# Patient Record
Sex: Female | Born: 1945 | Race: White | Hispanic: No | Marital: Married | Smoking: Former smoker
Health system: Southern US, Community
[De-identification: ages and names within clinical notes are randomized; demographics above are authoritative.]

## PROBLEM LIST (undated history)

## (undated) DIAGNOSIS — F419 Anxiety disorder, unspecified: Secondary | ICD-10-CM

## (undated) DIAGNOSIS — F1010 Alcohol abuse, uncomplicated: Secondary | ICD-10-CM

## (undated) DIAGNOSIS — G8929 Other chronic pain: Secondary | ICD-10-CM

## (undated) DIAGNOSIS — E7800 Pure hypercholesterolemia, unspecified: Secondary | ICD-10-CM

## (undated) DIAGNOSIS — Z9981 Dependence on supplemental oxygen: Secondary | ICD-10-CM

## (undated) DIAGNOSIS — J189 Pneumonia, unspecified organism: Secondary | ICD-10-CM

## (undated) DIAGNOSIS — Z9289 Personal history of other medical treatment: Secondary | ICD-10-CM

## (undated) DIAGNOSIS — F329 Major depressive disorder, single episode, unspecified: Secondary | ICD-10-CM

## (undated) DIAGNOSIS — J42 Unspecified chronic bronchitis: Secondary | ICD-10-CM

## (undated) DIAGNOSIS — I509 Heart failure, unspecified: Secondary | ICD-10-CM

## (undated) DIAGNOSIS — F32A Depression, unspecified: Secondary | ICD-10-CM

## (undated) DIAGNOSIS — I10 Essential (primary) hypertension: Secondary | ICD-10-CM

## (undated) DIAGNOSIS — J45909 Unspecified asthma, uncomplicated: Secondary | ICD-10-CM

## (undated) DIAGNOSIS — I4891 Unspecified atrial fibrillation: Secondary | ICD-10-CM

## (undated) DIAGNOSIS — M545 Low back pain, unspecified: Secondary | ICD-10-CM

## (undated) DIAGNOSIS — R739 Hyperglycemia, unspecified: Secondary | ICD-10-CM

## (undated) DIAGNOSIS — M1990 Unspecified osteoarthritis, unspecified site: Secondary | ICD-10-CM

## (undated) DIAGNOSIS — M87059 Idiopathic aseptic necrosis of unspecified femur: Secondary | ICD-10-CM

## (undated) DIAGNOSIS — L4050 Arthropathic psoriasis, unspecified: Secondary | ICD-10-CM

## (undated) DIAGNOSIS — R011 Cardiac murmur, unspecified: Secondary | ICD-10-CM

## (undated) DIAGNOSIS — D649 Anemia, unspecified: Secondary | ICD-10-CM

## (undated) DIAGNOSIS — J449 Chronic obstructive pulmonary disease, unspecified: Secondary | ICD-10-CM

## (undated) HISTORY — DX: Idiopathic aseptic necrosis of unspecified femur: M87.059

## (undated) HISTORY — DX: Anxiety disorder, unspecified: F41.9

## (undated) HISTORY — PX: TUBAL LIGATION: SHX77

## (undated) HISTORY — DX: Chronic obstructive pulmonary disease, unspecified: J44.9

## (undated) HISTORY — DX: Hyperglycemia, unspecified: R73.9

## (undated) HISTORY — DX: Unspecified atrial fibrillation: I48.91

## (undated) HISTORY — PX: JOINT REPLACEMENT: SHX530

## (undated) HISTORY — DX: Alcohol abuse, uncomplicated: F10.10

## (undated) HISTORY — DX: Essential (primary) hypertension: I10

## (undated) HISTORY — PX: TONSILLECTOMY AND ADENOIDECTOMY: SUR1326

---

## 2006-03-12 ENCOUNTER — Inpatient Hospital Stay (HOSPITAL_COMMUNITY): Admission: EM | Admit: 2006-03-12 | Discharge: 2006-03-16 | Payer: Self-pay | Admitting: Emergency Medicine

## 2007-09-22 ENCOUNTER — Ambulatory Visit (HOSPITAL_COMMUNITY): Admission: RE | Admit: 2007-09-22 | Discharge: 2007-09-22 | Payer: Self-pay | Admitting: Family Medicine

## 2008-01-29 ENCOUNTER — Ambulatory Visit (HOSPITAL_COMMUNITY): Admission: RE | Admit: 2008-01-29 | Discharge: 2008-01-29 | Payer: Self-pay | Admitting: Chiropractic Medicine

## 2008-05-11 ENCOUNTER — Inpatient Hospital Stay (HOSPITAL_COMMUNITY): Admission: RE | Admit: 2008-05-11 | Discharge: 2008-05-16 | Payer: Self-pay | Admitting: Orthopedic Surgery

## 2008-05-15 HISTORY — PX: TOTAL HIP ARTHROPLASTY: SHX124

## 2009-03-31 ENCOUNTER — Encounter: Payer: Self-pay | Admitting: Emergency Medicine

## 2009-04-01 ENCOUNTER — Ambulatory Visit: Payer: Self-pay | Admitting: Emergency Medicine

## 2009-04-01 ENCOUNTER — Inpatient Hospital Stay (HOSPITAL_COMMUNITY): Admission: EM | Admit: 2009-04-01 | Discharge: 2009-04-06 | Payer: Self-pay | Admitting: Internal Medicine

## 2009-04-07 ENCOUNTER — Telehealth: Payer: Self-pay | Admitting: Pulmonary Disease

## 2009-04-18 ENCOUNTER — Telehealth (INDEPENDENT_AMBULATORY_CARE_PROVIDER_SITE_OTHER): Payer: Self-pay | Admitting: *Deleted

## 2009-04-18 DIAGNOSIS — J449 Chronic obstructive pulmonary disease, unspecified: Secondary | ICD-10-CM | POA: Insufficient documentation

## 2009-04-19 ENCOUNTER — Encounter: Payer: Self-pay | Admitting: Emergency Medicine

## 2009-04-20 ENCOUNTER — Ambulatory Visit: Payer: Self-pay | Admitting: Emergency Medicine

## 2009-04-20 DIAGNOSIS — A3790 Whooping cough, unspecified species without pneumonia: Secondary | ICD-10-CM | POA: Insufficient documentation

## 2009-04-20 DIAGNOSIS — R930 Abnormal findings on diagnostic imaging of skull and head, not elsewhere classified: Secondary | ICD-10-CM

## 2009-04-20 DIAGNOSIS — Z8679 Personal history of other diseases of the circulatory system: Secondary | ICD-10-CM

## 2009-04-26 ENCOUNTER — Ambulatory Visit (HOSPITAL_COMMUNITY): Admission: RE | Admit: 2009-04-26 | Discharge: 2009-04-26 | Payer: Self-pay | Admitting: Emergency Medicine

## 2009-04-28 ENCOUNTER — Encounter: Payer: Self-pay | Admitting: Emergency Medicine

## 2009-05-04 ENCOUNTER — Encounter: Payer: Self-pay | Admitting: Emergency Medicine

## 2009-05-17 ENCOUNTER — Ambulatory Visit: Payer: Self-pay | Admitting: Emergency Medicine

## 2009-05-17 ENCOUNTER — Encounter: Payer: Self-pay | Admitting: Emergency Medicine

## 2009-06-14 ENCOUNTER — Encounter: Payer: Self-pay | Admitting: Emergency Medicine

## 2010-03-03 LAB — HM COLONOSCOPY

## 2010-04-08 ENCOUNTER — Encounter: Payer: Self-pay | Admitting: Internal Medicine

## 2010-04-19 NOTE — Miscellaneous (Signed)
Summary: PT Eval orders/[facility]  PT Eval orders/[facility]   Imported By: Sherian Rein 04/28/2009 13:52:18  _____________________________________________________________________  External Attachment:    Type:   Image     Comment:   External Document

## 2010-04-19 NOTE — Miscellaneous (Signed)
Summary: Plan/[facility]  Plan/[facility]   Imported By: Lester Sunburst 05/09/2009 09:50:39  _____________________________________________________________________  External Attachment:    Type:   Image     Comment:   External Document

## 2010-04-19 NOTE — Progress Notes (Signed)
Summary: order for therapy  Phone Note From Other Clinic Call back at [phone] ex [phone]   Caller: [facility]( [facility])  Call For: byrum Summary of Call: pt is requesting homehealth physical therapy Initial call taken by: Rickard Patience,  April 18, 2009 9:05 AM  Follow-up for Phone Call        Please advise.  Aundra Millet Reynolds LPN  April 18, 2009 9:57 AM   OK putting the order in Leslye Peer MD  April 18, 2009 2:46 PM  Follow-up by: Leslye Peer MD,  April 18, 2009 2:47 PM  New Problems: COPD (ICD-496)   New Problems: COPD (ICD-496)

## 2010-04-19 NOTE — Assessment & Plan Note (Signed)
Summary: COPD   Visit Type:  Follow-up Primary Provider/Referring Provider:  Dr. Paulene Floor at [facility]  CC:  Followup COPD with PFT's.  Pt states that overall her breathing has been okay.  She denies any complaints..  History of Present Illness: 65 yo woman with hx tobacco, COPD dx [place] in 2000, HTN. Was admitted to [facility] 1/15-1/20/11 for Resp Failure in setting COPD exacerbation and scattered patchy in filtrates on CT scan of the chest. Was treated for CAP with plans to repeat films and insure clearance, consider other etiologies (? COP). Completing her pred taper now, taking alb/atrovent/budesonide nebs since d/c.   ROV 05/17/09 -- Returns for F/U. CT scan done since last visit shows resolution of her PNA in July. Had PFT today as below, shows severe AFL with decreased diffusion (FEV1 0.68L). Has been doing fairly well. Some exertional dyspnea but stable. No exacerbations since last visit.   Current Medications (verified): 1)  Proair Hfa 108 (90 Base) Mcg/act Aers (Albuterol Sulfate) .... 2 Puffs Every 6 Hours As Needed 2)  Ipratropium Bromide 0.02 % Soln (Ipratropium Bromide) .... Four Times Daily 3)  Albuterol Sulfate (2.5 Mg/73ml) 0.083% Nebu (Albuterol Sulfate) .... Four Times Daily 4)  Mucinex 600 Mg Xr12h-Tab (Guaifenesin) .... Two Times A Day 5)  Benicar 40 Mg Tabs (Olmesartan Medoxomil) .... 1/2 By Mouth Daily 6)  Robaxin 500 Mg Tabs (Methocarbamol) .... Three Times A Day As Needed 7)  Hydrochlorothiazide 25 Mg Tabs (Hydrochlorothiazide) .Marland Kitchen.. 1 By Mouth Daily 8)  Amitriptyline Hcl 50 Mg Tabs (Amitriptyline Hcl) .Marland Kitchen.. 1 By Mouth At Bedtime 9)  Hydrocodone-Acetaminophen 5-325 Mg Tabs (Hydrocodone-Acetaminophen) .Marland Kitchen.. 1 By Mouth Every 6-8 Hours As Needed 10)  Aspirin 325 Mg Tabs (Aspirin) .Marland Kitchen.. 1 Once Daily  Allergies (verified): 1)  ! Tramadol Hcl  Vital Signs:  Patient profile:   65 year old female Weight:      135 pounds O2 Sat:      93 % on Room air Temp:      98.0 degrees F oral Pulse rate:   94 / minute BP sitting:   136 / 80  (left arm)  Vitals Entered By: Vernie Murders (May 17, 2009 1:43 PM)  O2 Flow:  Room air  Physical Exam  General:  elderly woman, ill appearing, on O2 Head:  normocephalic and atraumatic Eyes:  conjunctiva and sclera clear Nose:  no deformity, discharge, inflammation, or lesions Mouth:  no deformity or lesions Neck:  no masses, thyromegaly, or abnormal cervical nodes Lungs:  very distant, mild end exp wheeze on a forced exp Heart:  regular rate and rhythm, S1, S2 without murmurs, rubs, gallops, or clicks Abdomen:  not examined Msk:  no deformity or scoliosis noted with normal posture Extremities:  no clubbing, cyanosis, edema, or deformity noted Neurologic:  non-focal Skin:  intact without lesions or rashes Psych:  alert and cooperative; normal mood and affect; normal attention span and concentration   Impression & Recommendations:  Problem # 1:  COPD (ICD-496) Continue your albuterol and ipratropium nebulizers as you are taking them.  Continue your oxygen at all times, especially with exertion.  Do not restart budesonide nebulized at this time.  Follow up with Dr Delton Coombes in 4 months or as needed   Medications Added to Medication List This Visit: 1)  Aspirin 325 Mg Tabs (Aspirin) .Marland Kitchen.. 1 once daily  Other Orders: Est. Patient Level IV (16109)  Patient Instructions: 1)  Continue your albuterol and ipratropium nebulizers as you  are taking them.  2)  Continue your oxygen at all times, especially with exertion.  3)  Do not restart budesonide nebulized at this time.  4)  Follow up with Dr Delton Coombes in 4 months or as needed

## 2010-04-19 NOTE — Miscellaneous (Signed)
Summary: PFT  Clinical Lists Changes  Observations: Added new observation of PFT COMMENTS: Very sevewre AFL, no response to BD. Volumes are hyperinflated. Decreased diffusion capacity. RSB (05/17/2009 15:19) Added new observation of DLCO/VA%EXP: 69 % (05/17/2009 15:19) Added new observation of DLCO/VA: 2.51 % (05/17/2009 15:19) Added new observation of DLCO % EXPEC: 59 % (05/17/2009 15:19) Added new observation of DLCO: 10.2 % (05/17/2009 15:19) Added new observation of RV % EXPECT: 227 % (05/17/2009 15:19) Added new observation of RV: 3.54 L (05/17/2009 15:19) Added new observation of TLC % EXPECT: 140 % (05/17/2009 15:19) Added new observation of TLC: 5.65 L (05/17/2009 15:19) Added new observation of FEF2575%EXPS: 9 % (05/17/2009 15:19) Added new observation of PSTFEF25/75P: 2.20  (05/17/2009 15:19) Added new observation of PSTFEF25/75%: 0.20 L/min (05/17/2009 15:19) Added new observation of PSTFEV1/FCV%: - % (05/17/2009 15:19) Added new observation of FEV1FVCPRDPS: 73 % (05/17/2009 15:19) Added new observation of PSTFEV1/FVC: 28 % (05/17/2009 15:19) Added new observation of POSTFEV1%PRD: 36 % (05/17/2009 15:19) Added new observation of FEV1PRDPST: 1.78 L (05/17/2009 15:19) Added new observation of POST FEV1: 0.64 L/min (05/17/2009 15:19) Added new observation of POST FVC%EXP: 91 % (05/17/2009 15:19) Added new observation of FVCPRDPST: 2.49 L/min (05/17/2009 15:19) Added new observation of POST FVC: 2.26 L (05/17/2009 15:19) Added new observation of FEF % EXPEC: 11 % (05/17/2009 15:19) Added new observation of FEF25-75%PRE: 2.20 L/min (05/17/2009 15:19) Added new observation of FEF 25-75%: 0.25 L/min (05/17/2009 15:19) Added new observation of FEV1/FVC%EXP: - % (05/17/2009 15:19) Added new observation of FEV1/FVC PRE: 73 % (05/17/2009 15:19) Added new observation of FEV1/FVC: 32 % (05/17/2009 15:19) Added new observation of FEV1 % EXP: 38 % (05/17/2009 15:19) Added new observation  of FEV1 PREDICT: 1.78 L (05/17/2009 15:19) Added new observation of FEV1: 0.68 L (05/17/2009 15:19) Added new observation of FVC % EXPECT: 85 % (05/17/2009 15:19) Added new observation of FVC PREDICT: 2.49 L (05/17/2009 15:19) Added new observation of FVC: 2.11 L (05/17/2009 15:19) Added new observation of PFT DATE: 05/17/2009  (05/17/2009 15:19)      Pulmonary Function Test Date: 05/17/2009 Height (in.): 60 Gender: Female  Pre-Spirometry FVC    Value: 2.11 L/min   Pred: 2.49 L/min     % Pred: 85 % FEV1    Value: 0.68 L     Pred: 1.78 L     % Pred: 38 % FEV1/FVC  Value: 32 %     Pred: 73 %     % Pred: - % FEF 25-75  Value: 0.25 L/min   Pred: 2.20 L/min     % Pred: 11 %  Post-Spirometry FVC    Value: 2.26 L/min   Pred: 2.49 L/min     % Pred: 91 % FEV1    Value: 0.64 L     Pred: 1.78 L     % Pred: 36 % FEV1/FVC  Value: 28 %     Pred: 73 %     % Pred: - % FEF 25-75  Value: 0.20 L/min   Pred: 2.20 L/min     % Pred: 9 %  Lung Volumes TLC    Value: 5.65 L   % Pred: 140 % RV    Value: 3.54 L   % Pred: 227 % DLCO    Value: 10.2 %   % Pred: 59 % DLCO/VA  Value: 2.51 %   % Pred: 69 %  Comments: Very sevewre AFL, no response to BD. Volumes are hyperinflated. Decreased diffusion capacity. RSB

## 2010-04-19 NOTE — Progress Notes (Signed)
Summary: needs smaples or discount on meds  Phone Note Call from Patient Call back at Home Phone [phone]   Caller: Lonie Peak (daughter) Call For: byrum Reason for Call: Talk to Nurse Summary of Call: Patient was dischard from the hospital. Dr. Delton Coombes wanted her to stay in her 2 meds proair, and xanex. However he did not give her prescription for thoses. The one she has for those has ran out and expired. Also he put her on 3 other meds, budesonide, an antibiotic and olmensartan. She can not afford all three of thoses, is there any way we can give her samples or some kinda of discount. you can call the daughter back teresa, and if shes not there you can talk to Mr Mancillas.  [facility] [place] Initial call taken by: Valinda Hoar,  April 07, 2009 1:12 PM  Follow-up for Phone Call        Spoke with pt's daughter.  Pt d/c'ed from hospital on xanax 0.5 1/2 to 1 tab every 8 hours as needed anxiety and also proair as needed.  Was not given rxs for these.  Is this okay to go ahead and call in? Pt has hosp followup with Dr Delton Coombes on 04/21/09.  Please advise thanks! Vernie Murders  April 07, 2009 1:55 PM   Additional Follow-up for Phone Call Additional follow up Details #1::        She was under the hospitlaist service in the hospital. Can give her pro-air samples. Rest to contact her primary - they should be able to get records from the hospital Additional Follow-up by: Comer Locket. Vassie Loll MD,  April 07, 2009 2:00 PM    Additional Follow-up for Phone Call Additional follow up Details #2::    rx sent for proair. pt advised to get xanax form PMD.Jennifer Yancey Flemings CMA  April 07, 2009 2:17 PM   New/Updated Medications: PROAIR HFA 108 (90 BASE) MCG/ACT AERS (ALBUTEROL SULFATE) 2 puffs every 6 hours as needed Prescriptions: PROAIR HFA 108 (90 BASE) MCG/ACT AERS (ALBUTEROL SULFATE) 2 puffs every 6 hours as needed  #1 x 3   Entered by:   Carron Curie CMA   Authorized by:   Comer Locket. Vassie Loll  MD   Signed by:   Carron Curie CMA on 04/07/2009   Method used:   Electronically to        [facility] (retail)       [address]       [place], [place]  [address]       Ph: [phone]       Fax: [phone]   RxID:   [id number]

## 2010-04-19 NOTE — Miscellaneous (Signed)
Summary: OT COPD Orders/[facility]  OT COPD Orders/[facility]   Imported By: Sherian Rein 05/02/2009 12:18:02  _____________________________________________________________________  External Attachment:    Type:   Image     Comment:   External Document

## 2010-04-19 NOTE — Miscellaneous (Signed)
Summary: Orders Update pft charges  Clinical Lists Changes  Orders: Added new Service order of Carbon Monoxide diffusing w/capacity (94720) - Signed Added new Service order of Lung Volumes (94240) - Signed Added new Service order of Spirometry (Pre & Post) (94060) - Signed 

## 2010-04-19 NOTE — Assessment & Plan Note (Signed)
Summary: COPD, abnormal CT scan    Visit Type:  Hospital Follow-up Primary Provider/Referring Provider:  Dr. Paulene Floor at [facility]  CC:  HFU. for COPD.[name]  History of Present Illness: 65 yo woman with hx tobacco, COPD dx [place] in 2000, HTN. Was admitted to [facility] 1/15-1/20/11 for Resp Failure in setting COPD exacerbation and scattered patchy in filtrates on CT scan of the chest. Was treated for CAP with plans to repeat films and insure clearance, consider other etiologies (? COP). Completing her pred taper now, taking alb/atrovent/budesonide nebs since d/c.   Preventive Screening-Counseling & Management  Alcohol-Tobacco     Smoking Status: quit     Year Quit: 2010     Pack years: 35 years x1 ppd  Current Medications (verified): 1)  Proair Hfa 108 (90 Base) Mcg/act Aers (Albuterol Sulfate) .... 2 Puffs Every 6 Hours As Needed 2)  Ipratropium Bromide 0.02 % Soln (Ipratropium Bromide) .... Four Times Daily 3)  Albuterol Sulfate (2.5 Mg/64ml) 0.083% Nebu (Albuterol Sulfate) .... Four Times Daily 4)  Budesonide 0.5 Mg/50ml Susp (Budesonide) .... Two Times A Day 5)  Mucinex 600 Mg Xr12h-Tab (Guaifenesin) .... Two Times A Day 6)  Prednisone 10 Mg Tabs (Prednisone) .... Tapering Dose 7)  Klor-Con M20 20 Meq Cr-Tabs (Potassium Chloride Crys Cr) .[name].. 1 By Mouth Daily 8)  Benicar 40 Mg Tabs (Olmesartan Medoxomil) .... 1/2 By Mouth Daily 9)  Robaxin 500 Mg Tabs (Methocarbamol) .... Three Times A Day As Needed 10)  Hydrochlorothiazide 25 Mg Tabs (Hydrochlorothiazide) .[name].. 1 By Mouth Daily 11)  Amitriptyline Hcl 50 Mg Tabs (Amitriptyline Hcl) .[name].. 1 By Mouth At Bedtime 12)  Hydrocodone-Acetaminophen 5-325 Mg Tabs (Hydrocodone-Acetaminophen) .[name].. 1 By Mouth Every 6-8 Hours As Needed  Allergies (verified): 1)  ! Tramadol Hcl  Past History:  Past Medical History: COPD (no PFT's to date) HTN Anxiety disorder Hyperglycemia Psoriasis Avascular necrosis L hip EtOH abuse  Past  Surgical History: Total Hip Arthroplasty, right hip 05/15/2008  Family History: Family History MI/Heart Attack--father Leukemia--PGF Alzheimers--mother  Social History: Patient states former smoker, quit 12/10 EtOH use, hx of abuse and withdrawal Married HousewifeSmoking Status:  quit Pack years:  35 years x1 ppd  Vital Signs:  Patient profile:   65 year old female Height:      60 inches (152.40 cm) Weight:      120.13 pounds (54.60 kg) BMI:     23.55 O2 Sat:      97 % on 2 L/min Temp:     98.1 degrees F (36.72 degrees C) oral Pulse rate:   101 / minute BP sitting:   112 / 80  (left arm) Cuff size:   regular  Vitals Entered By: Michel Bickers CMA (April 20, 2009 4:12 PM)  O2 Flow:  2 L/min  Serial Vital Signs/Assessments:  Comments: 4:58 PM Ambulatory Pulse Oximetry  Resting; HR__89___    02 Sat_92____  Lap1 (185 feet)   HR__134___   02 Sat__84___ Lap2 (185 feet)   HR_____   02 Sat_____    Lap3 (185 feet)   HR_____   02 Sat_____  ___Test Completed without Difficulty __x_Test Stopped due to: drop in o2 wats to 84% on room air. Pulse was 134. The patient was place on 2 liters continuous flow and her  sats recovered to 94%. Pulse was 59.     By: Michel Bickers CMA   CC: HFU. for COPD.   Physical Exam  General:  elderly woman, ill appearing, on O2 Head:  normocephalic and atraumatic Eyes:  conjunctiva and sclera clear Nose:  no deformity, discharge, inflammation, or lesions Mouth:  no deformity or lesions Neck:  no masses, thyromegaly, or abnormal cervical nodes Lungs:  very distant, mild end exp wheeze on a forced exp Heart:  regular rate and rhythm, S1, S2 without murmurs, rubs, gallops, or clicks Abdomen:  not examined Msk:  no deformity or scoliosis noted with normal posture Extremities:  no clubbing, cyanosis, edema, or deformity noted Neurologic:  non-focal Skin:  intact without lesions or rashes Psych:  alert  and cooperative; normal mood and affect; normal attention span and concentration   CT of Chest  Procedure date:  03/31/2009  Findings:      Comparison: 09/22/2007; chest radiograph of 03/31/2009    Findings: An AP window lymph node has a short axis diameter of 1.2   cm, compatible with pathologic enlargement.  A lymph node anterior   to the carina has a short axis diameter of 1.2 cm, also abnormally   enlarged.  A lymph node between the trachea and esophagus has a   short axis diameter of 0.9 cm.  Scattered smaller mediastinal lymph   nodes are present.    Severe emphysema noted.    There is a band of opacity extending in the left anterior upper   lobe to the left hilum, with ground-glass component and with some   associated volume loss.    There is honeycombing in the superior segment left lower lobe with   some increased density along the secondary pulmonary lobules.   Ground-glass opacity and airspace opacity in the lingula is   present, and there are patchy opacities medially in the right upper   lobe peripherally in the right lower lobe, along with some faint   ground-glass opacities in the right lower lobe.    IMPRESSION:    1.  New mediastinal adenopathy with patchy airspace opacities and   ground-glass opacity superimposed on emphysema.  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  Follow-up to ensure   resolution of the adenopathy and airspace opacities is recommended   to exclude atypical infectious agent such as tuberculosis, or   malignancy.  Impression & Recommendations:  Problem # 1:  COPD (ICD-496) Continue BDs as ordered Continue O2 full PFTs  Problem # 2:  COMPUTERIZED TOMOGRAPHY, CHEST, ABNORMAL (ICD-793.1) Suspect this was CAP, but non-infectious etiologies also possible (? COP) - repeat high res Ct scan now to look for resolution - if infiltrates persist  consider FOB to further eval  Medications Added to Medication List This Visit: 1)  Ipratropium Bromide 0.02 % Soln (Ipratropium bromide) .... Four times daily 2)  Albuterol Sulfate (2.5 Mg/61ml) 0.083% Nebu (Albuterol sulfate) .... Four times daily 3)  Budesonide 0.5 Mg/39ml Susp (Budesonide) .... Two times a day 4)  Mucinex 600 Mg Xr12h-tab (Guaifenesin) .... Two times a day 5)  Prednisone 10 Mg Tabs (Prednisone) .... Tapering dose 6)  Klor-con M20 20 Meq Cr-tabs (Potassium chloride crys cr) .[name].. 1 by mouth daily 7)  Benicar 40 Mg Tabs (Olmesartan medoxomil) .... 1/2 by mouth daily 8)  Robaxin 500 Mg Tabs (Methocarbamol) .... Three times a day as needed 9)  Hydrochlorothiazide 25 Mg  Tabs (Hydrochlorothiazide) .[name].. 1 by mouth daily 10)  Amitriptyline Hcl 50 Mg Tabs (Amitriptyline hcl) .[name].. 1 by mouth at bedtime 11)  Hydrocodone-acetaminophen 5-325 Mg Tabs (Hydrocodone-acetaminophen) .[name].. 1 by mouth every 6-8 hours as needed  Other Orders: Est. Patient Level IV (16109) Pulmonary Referral (Pulmonary)  Patient Instructions: 1)  Continue your nebulized medications as you are taking them 2)  You may restart your Humera 3)  Continue your oxygen at 2L/min  4)  We will repeat your CT scan of the chest to compare to your prior study 5)  We will perform full PFT's at the time of your next appointment 6)  Follow up with Dr Delton Coombes next available appointment.    Immunization History:  Influenza Immunization History:    Influenza:  historical (11/30/2008)  Pneumovax Immunization History:    Pneumovax:  historical (11/30/2008)   Appended Document: COPD, abnormal CT scan     Clinical Lists Changes  Problems: Removed problem of C O P D (ICD-496)

## 2010-06-03 LAB — CBC
HCT: 36.9 % (ref 36.0–46.0)
Hemoglobin: 12 g/dL (ref 12.0–15.0)
Hemoglobin: 12.8 g/dL (ref 12.0–15.0)
MCHC: 33 g/dL (ref 30.0–36.0)
MCHC: 32.2 g/dL (ref 30.0–36.0)
MCHC: 32.5 g/dL (ref 30.0–36.0)
MCV: 96.2 fL (ref 78.0–100.0)
Platelets: 130 10*3/uL — ABNORMAL LOW (ref 150–400)
Platelets: 212 10*3/uL (ref 150–400)
RBC: 3.84 MIL/uL — ABNORMAL LOW (ref 3.87–5.11)
RBC: 4.1 MIL/uL (ref 3.87–5.11)
RDW: 14.3 % (ref 11.5–15.5)
RDW: 13.7 % (ref 11.5–15.5)
RDW: 14 % (ref 11.5–15.5)

## 2010-06-03 LAB — AFB CULTURE WITH SMEAR (NOT AT ARMC)
Acid Fast Smear: NONE SEEN
Acid Fast Smear: NONE SEEN

## 2010-06-03 LAB — DIFFERENTIAL
Basophils Absolute: 0 10*3/uL (ref 0.0–0.1)
Basophils Absolute: 0 10*3/uL (ref 0.0–0.1)
Basophils Absolute: 0 10*3/uL (ref 0.0–0.1)
Basophils Relative: 0 % (ref 0–1)
Basophils Relative: 0 % (ref 0–1)
Basophils Relative: 0 % (ref 0–1)
Eosinophils Absolute: 0 10*3/uL (ref 0.0–0.7)
Eosinophils Relative: 0 % (ref 0–5)
Lymphocytes Relative: 7 % — ABNORMAL LOW (ref 12–46)
Lymphocytes Relative: 9 % — ABNORMAL LOW (ref 12–46)
Lymphs Abs: 0.6 10*3/uL — ABNORMAL LOW (ref 0.7–4.0)
Monocytes Absolute: 0.8 10*3/uL (ref 0.1–1.0)
Monocytes Absolute: 0.5 10*3/uL (ref 0.1–1.0)
Monocytes Absolute: 0.5 10*3/uL (ref 0.1–1.0)
Monocytes Absolute: 0.6 10*3/uL (ref 0.1–1.0)
Monocytes Relative: 7 % (ref 3–12)
Monocytes Relative: 7 % (ref 3–12)
Neutro Abs: 9.5 10*3/uL — ABNORMAL HIGH (ref 1.7–7.7)
Neutro Abs: 5.5 10*3/uL (ref 1.7–7.7)
Neutrophils Relative %: 85 % — ABNORMAL HIGH (ref 43–77)
Neutrophils Relative %: 84 % — ABNORMAL HIGH (ref 43–77)

## 2010-06-03 LAB — BASIC METABOLIC PANEL
BUN: 18 mg/dL (ref 6–23)
BUN: 21 mg/dL (ref 6–23)
CO2: 31 mEq/L (ref 19–32)
CO2: 31 mEq/L (ref 19–32)
Calcium: 8.7 mg/dL (ref 8.4–10.5)
Chloride: 90 mEq/L — ABNORMAL LOW (ref 96–112)
Chloride: 96 mEq/L (ref 96–112)
Creatinine, Ser: 1.19 mg/dL (ref 0.4–1.2)
GFR calc Af Amer: >60 mL/min (ref >60)
GFR calc Af Amer: >60 mL/min (ref >60)
GFR calc non Af Amer: 46 mL/min — ABNORMAL LOW (ref >60)
GFR calc non Af Amer: >60 mL/min (ref >60)
Glucose, Bld: 131 mg/dL — ABNORMAL HIGH (ref 70–99)
Glucose, Bld: 223 mg/dL — ABNORMAL HIGH (ref 70–99)
Potassium: 3.6 mEq/L (ref 3.5–5.1)
Potassium: 3.4 mEq/L — ABNORMAL LOW (ref 3.5–5.1)
Potassium: 4.1 mEq/L (ref 3.5–5.1)
Sodium: 135 mEq/L (ref 135–145)
Sodium: 136 mEq/L (ref 135–145)

## 2010-06-03 LAB — CULTURE, RESPIRATORY W GRAM STAIN

## 2010-06-03 LAB — GLUCOSE, CAPILLARY
Glucose-Capillary: 100 mg/dL — ABNORMAL HIGH (ref 70–99)
Glucose-Capillary: 126 mg/dL — ABNORMAL HIGH (ref 70–99)
Glucose-Capillary: 126 mg/dL — ABNORMAL HIGH (ref 70–99)
Glucose-Capillary: 128 mg/dL — ABNORMAL HIGH (ref 70–99)
Glucose-Capillary: 156 mg/dL — ABNORMAL HIGH (ref 70–99)
Glucose-Capillary: 160 mg/dL — ABNORMAL HIGH (ref 70–99)
Glucose-Capillary: 175 mg/dL — ABNORMAL HIGH (ref 70–99)

## 2010-06-03 LAB — BRAIN NATRIURETIC PEPTIDE
Pro B Natriuretic peptide (BNP): 35.6 pg/mL (ref 0.0–100.0)
Pro B Natriuretic peptide (BNP): <30 pg/mL (ref 0.0–100.0)

## 2010-06-03 LAB — CULTURE, BLOOD (ROUTINE X 2)
Culture: NO GROWTH
Culture: NO GROWTH

## 2010-06-03 LAB — BLOOD GAS, ARTERIAL
O2 Content: 4 L/min
O2 Saturation: 98.7 %
pH, Arterial: 7.304 — ABNORMAL LOW (ref 7.350–7.400)
pO2, Arterial: 151 mmHg — ABNORMAL HIGH (ref 80.0–100.0)

## 2010-06-03 LAB — HEPATIC FUNCTION PANEL
Albumin: 3.3 g/dL — ABNORMAL LOW (ref 3.5–5.2)
Total Bilirubin: 1 mg/dL (ref 0.3–1.2)
Total Protein: 7.5 g/dL (ref 6.0–8.3)

## 2010-06-03 LAB — CLOSTRIDIUM DIFFICILE EIA
C difficile Toxins A+B, EIA: NEGATIVE
C difficile Toxins A+B, EIA: NEGATIVE

## 2010-06-03 LAB — STOOL CULTURE

## 2010-06-03 LAB — LEGIONELLA ANTIGEN, URINE: Legionella Antigen, Urine: NEGATIVE

## 2010-06-03 LAB — VANCOMYCIN, TROUGH: Vancomycin Tr: 14.3 ug/mL (ref 10.0–20.0)

## 2010-06-03 LAB — EXPECTORATED SPUTUM ASSESSMENT W GRAM STAIN, RFLX TO RESP C

## 2010-06-16 ENCOUNTER — Encounter: Payer: Self-pay | Admitting: *Deleted

## 2010-06-16 DIAGNOSIS — N059 Unspecified nephritic syndrome with unspecified morphologic changes: Secondary | ICD-10-CM

## 2010-06-16 DIAGNOSIS — M8580 Other specified disorders of bone density and structure, unspecified site: Secondary | ICD-10-CM

## 2010-06-16 DIAGNOSIS — E785 Hyperlipidemia, unspecified: Secondary | ICD-10-CM

## 2010-06-16 DIAGNOSIS — G4700 Insomnia, unspecified: Secondary | ICD-10-CM

## 2010-06-16 DIAGNOSIS — M069 Rheumatoid arthritis, unspecified: Secondary | ICD-10-CM

## 2010-06-16 DIAGNOSIS — I10 Essential (primary) hypertension: Secondary | ICD-10-CM

## 2010-06-28 LAB — DIFFERENTIAL
Lymphocytes Relative: 35 % (ref 12–46)
Lymphs Abs: 2.2 10*3/uL (ref 0.7–4.0)
Monocytes Relative: 13 % — ABNORMAL HIGH (ref 3–12)
Neutro Abs: 3 10*3/uL (ref 1.7–7.7)
Neutrophils Relative %: 46 % (ref 43–77)

## 2010-06-28 LAB — CBC
HCT: 27.1 % — ABNORMAL LOW (ref 36.0–46.0)
Hemoglobin: 9.2 g/dL — ABNORMAL LOW (ref 12.0–15.0)
MCHC: 33.9 g/dL (ref 30.0–36.0)
RDW: 13.4 % (ref 11.5–15.5)

## 2010-06-28 LAB — COMPREHENSIVE METABOLIC PANEL
BUN: 12 mg/dL (ref 6–23)
Calcium: 8.8 mg/dL (ref 8.4–10.5)
Glucose, Bld: 123 mg/dL — ABNORMAL HIGH (ref 70–99)
Sodium: 134 mEq/L — ABNORMAL LOW (ref 135–145)
Total Protein: 5.8 g/dL — ABNORMAL LOW (ref 6.0–8.3)

## 2010-06-28 LAB — BLOOD GAS, ARTERIAL
Acid-Base Excess: 8.9 mmol/L — ABNORMAL HIGH (ref 0.0–2.0)
Bicarbonate: 29.8 mEq/L — ABNORMAL HIGH (ref 20.0–24.0)
Drawn by: 309681
O2 Content: 2 L/min
O2 Saturation: 99.7 %
Patient temperature: 98.6
pO2, Arterial: 184 mmHg — ABNORMAL HIGH (ref 80.0–100.0)

## 2010-06-28 LAB — PROTIME-INR: Prothrombin Time: 23.2 seconds — ABNORMAL HIGH (ref 11.6–15.2)

## 2010-07-03 LAB — BLOOD GAS, ARTERIAL
Acid-Base Excess: 11.4 mmol/L — ABNORMAL HIGH (ref 0.0–2.0)
Bicarbonate: 36.5 mEq/L — ABNORMAL HIGH (ref 20.0–24.0)
TCO2: 33.6 mmol/L (ref 0–100)
pCO2 arterial: 51.6 mmHg — ABNORMAL HIGH (ref 35.0–45.0)

## 2010-07-03 LAB — PROTIME-INR
INR: 0.9 (ref 0.00–1.49)
INR: 1.8 — ABNORMAL HIGH (ref 0.00–1.49)
Prothrombin Time: 12.6 seconds (ref 11.6–15.2)
Prothrombin Time: 21.5 seconds — ABNORMAL HIGH (ref 11.6–15.2)

## 2010-07-03 LAB — CBC
HCT: 27.6 % — ABNORMAL LOW (ref 36.0–46.0)
HCT: 29.8 % — ABNORMAL LOW (ref 36.0–46.0)
HCT: 30.6 % — ABNORMAL LOW (ref 36.0–46.0)
Hemoglobin: 12.9 g/dL (ref 12.0–15.0)
Hemoglobin: 9.3 g/dL — ABNORMAL LOW (ref 12.0–15.0)
MCHC: 33.1 g/dL (ref 30.0–36.0)
MCHC: 33.2 g/dL (ref 30.0–36.0)
MCHC: 33.3 g/dL (ref 30.0–36.0)
MCHC: 33.7 g/dL (ref 30.0–36.0)
MCV: 95.1 fL (ref 78.0–100.0)
MCV: 95.3 fL (ref 78.0–100.0)
MCV: 95.5 fL (ref 78.0–100.0)
MCV: 95.9 fL (ref 78.0–100.0)
MCV: 96 fL (ref 78.0–100.0)
Platelets: 212 10*3/uL (ref 150–400)
Platelets: 227 10*3/uL (ref 150–400)
RBC: 4.08 MIL/uL (ref 3.87–5.11)
RDW: 13.1 % (ref 11.5–15.5)
RDW: 13.2 % (ref 11.5–15.5)
RDW: 13.2 % (ref 11.5–15.5)
RDW: 13.3 % (ref 11.5–15.5)
WBC: 6.3 10*3/uL (ref 4.0–10.5)

## 2010-07-03 LAB — BASIC METABOLIC PANEL
BUN: 5 mg/dL — ABNORMAL LOW (ref 6–23)
CO2: 34 mEq/L — ABNORMAL HIGH (ref 19–32)
CO2: 37 mEq/L — ABNORMAL HIGH (ref 19–32)
Calcium: 8.8 mg/dL (ref 8.4–10.5)
Calcium: 9.2 mg/dL (ref 8.4–10.5)
Chloride: 100 mEq/L (ref 96–112)
Creatinine, Ser: 0.74 mg/dL (ref 0.4–1.2)
Glucose, Bld: 118 mg/dL — ABNORMAL HIGH (ref 70–99)
Glucose, Bld: 151 mg/dL — ABNORMAL HIGH (ref 70–99)
Potassium: 3.9 mEq/L (ref 3.5–5.1)
Potassium: 4.5 mEq/L (ref 3.5–5.1)
Sodium: 139 mEq/L (ref 135–145)

## 2010-07-03 LAB — URINALYSIS, ROUTINE W REFLEX MICROSCOPIC
Ketones, ur: NEGATIVE mg/dL
Nitrite: NEGATIVE
Protein, ur: NEGATIVE mg/dL
Urobilinogen, UA: 0.2 mg/dL (ref 0.0–1.0)

## 2010-07-03 LAB — COMPREHENSIVE METABOLIC PANEL
CO2: 29 mEq/L (ref 19–32)
Calcium: 9.8 mg/dL (ref 8.4–10.5)
Creatinine, Ser: 0.83 mg/dL (ref 0.4–1.2)
GFR calc non Af Amer: >60 mL/min (ref >60)
Glucose, Bld: 117 mg/dL — ABNORMAL HIGH (ref 70–99)

## 2010-07-03 LAB — ABO/RH: ABO/RH(D): A NEG

## 2010-07-03 LAB — APTT: aPTT: 23 seconds — ABNORMAL LOW (ref 24–37)

## 2010-07-30 IMAGING — CT CT CHEST W/O CM
2 of 5 series · 15 of 36 positions shown, 18 images · non-contrast
Comparison: 03/31/2009

CLINICAL DATA: Shortness of breath, recent pneumonia, COPD

CT CHEST WITHOUT CONTRAST
TECHNIQUE: Multidetector CT imaging of the chest was performed
following the standard protocol without IV contrast. Sagittal and
coronal MPR images reconstructed from axial data set. Additional
high-resolution images of the lung parenchyma read reconstructed.
Breast shield utilized.

[Series 2: chestroutine 5.0 b40f · axial · 0.61mm/px · z∈[-346,-71]mm · 12 of 61 slices shown, 15 images]
[im 3/61  mediastinal]
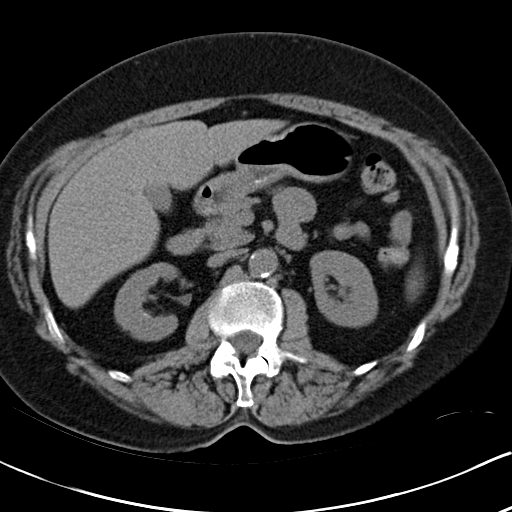
[im 3/61  lung]
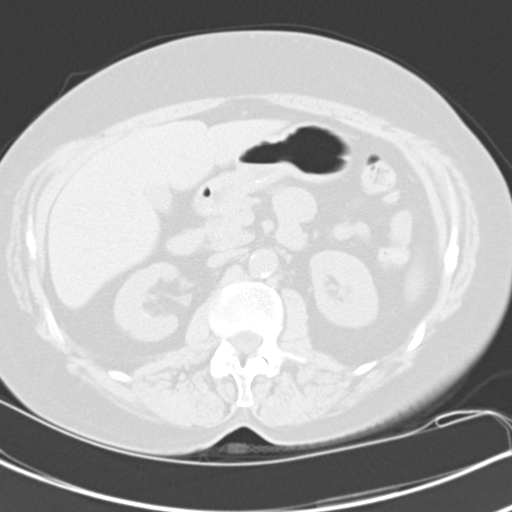
[im 8/61  lung]
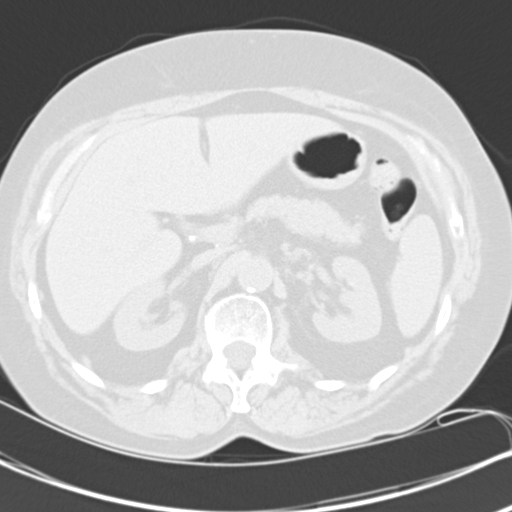
[im 14/61  lung]
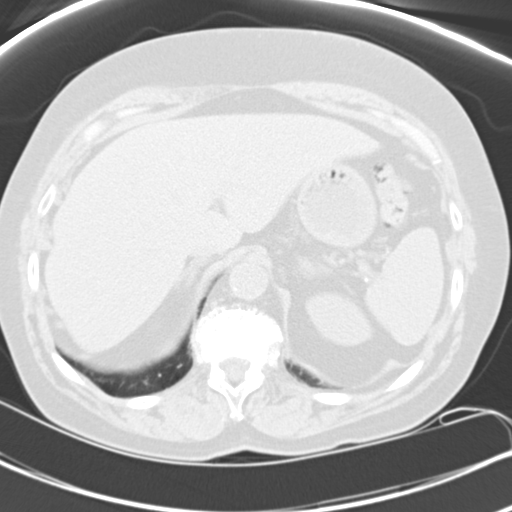
[im 19/61  lung]
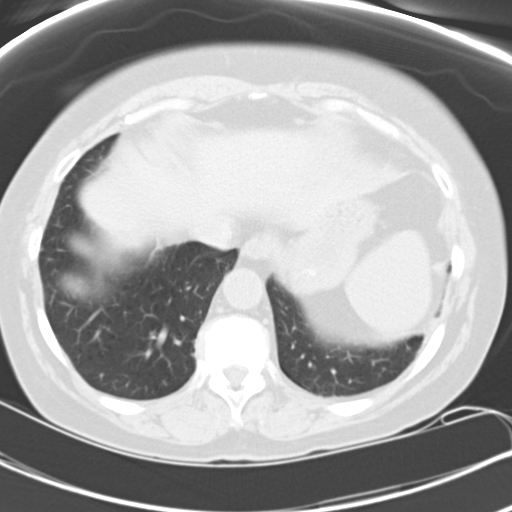
[im 24/61  mediastinal]
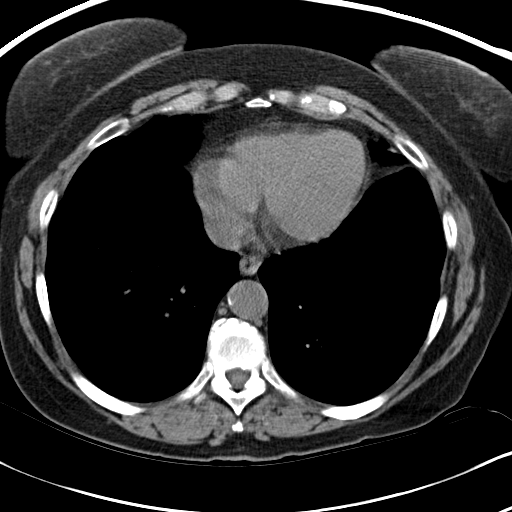
[im 24/61  lung]
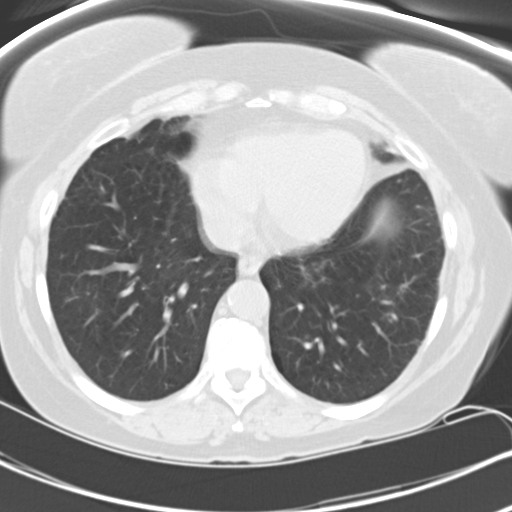
[im 29/61  lung]
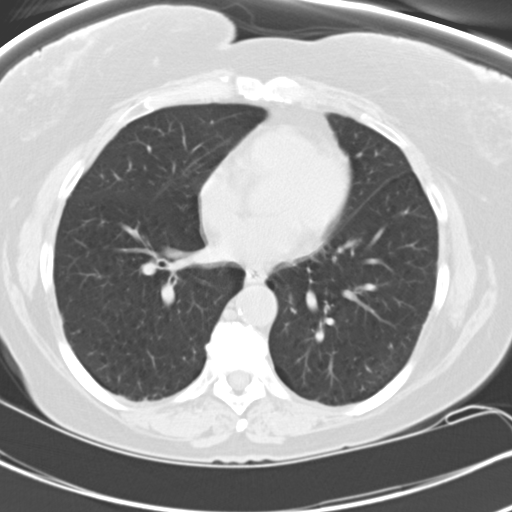
[im 32/61  lung]
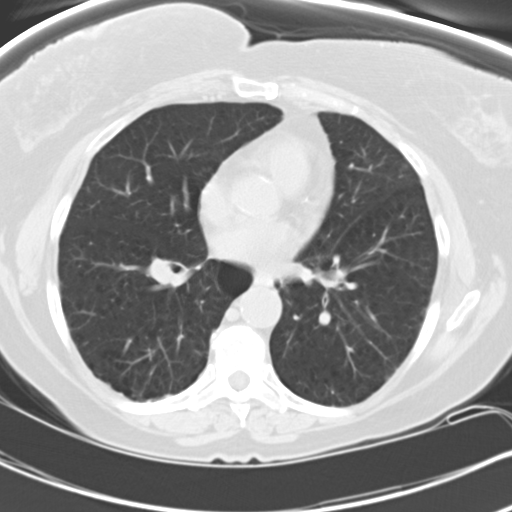
[im 37/61  lung]
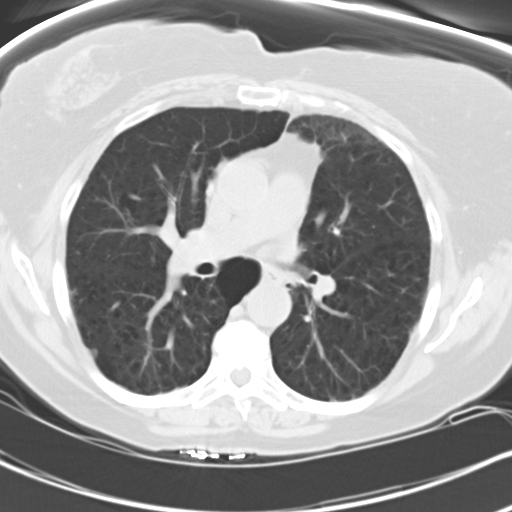
[im 42/61  mediastinal]
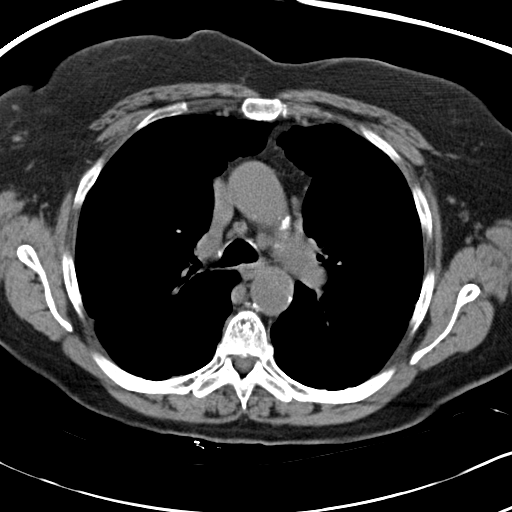
[im 42/61  lung]
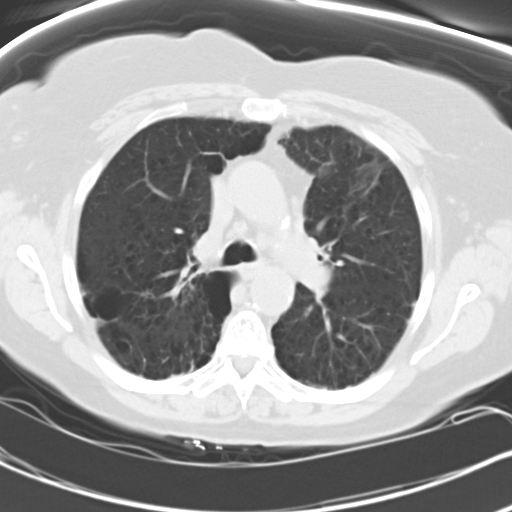
[im 47/61  lung]
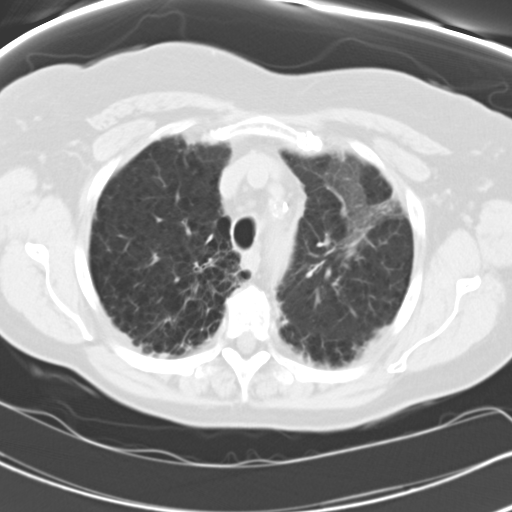
[im 53/61  lung]
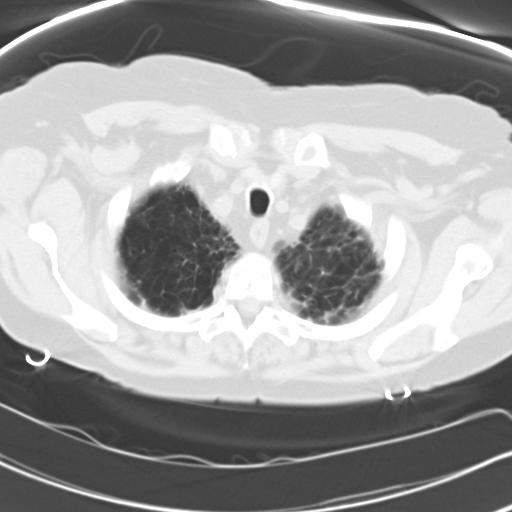
[im 58/61  lung]
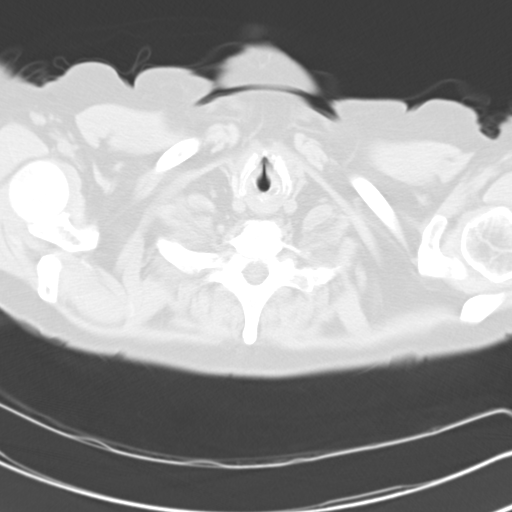

[Series 4: mpr coronal chest · coronal · 0.58mm/px · 3 of 72 slices shown]
[im 15/72  lung]
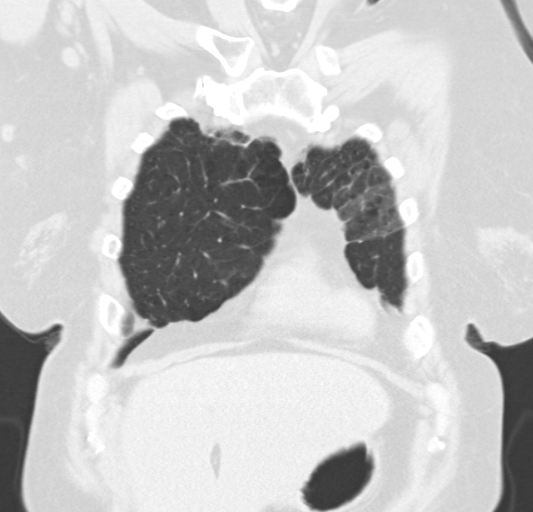
[im 29/72  lung]
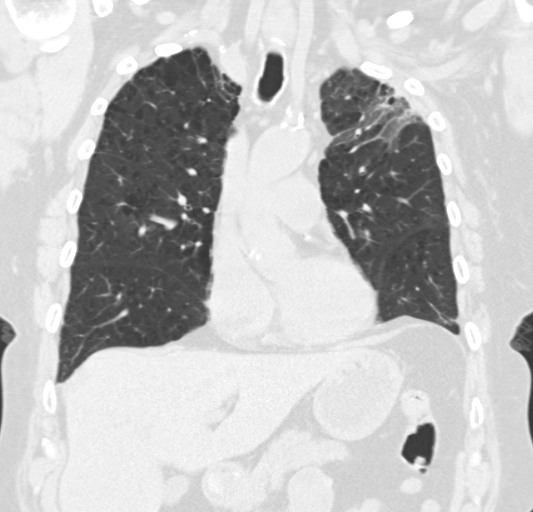
[im 43/72  lung]
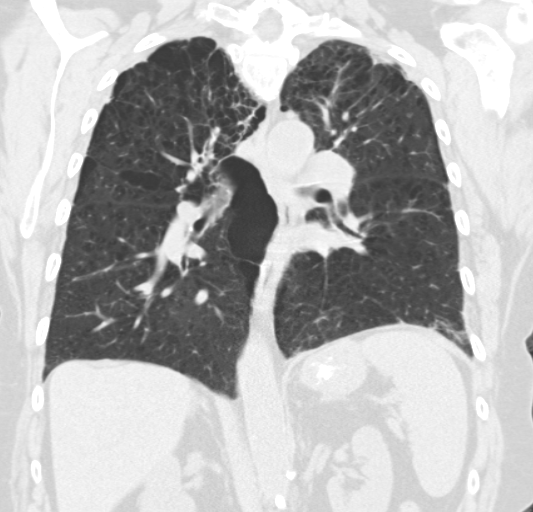

[15 of 36 positions shown; findings below may reference images not displayed]

FINDINGS: Scattered atherosclerotic calcifications of normal caliber thoracic
aorta and minimally in coronary arteries.
No definite thoracic adenopathy.
Visualized portion of upper abdomen shows no significant
abnormalities.
Mild scattered degenerative disc disease changes thoracic spine.

Emphysematous changes and chronic peribronchial thickening
compatible with COPD.
Minimal peripheral interstitial lung disease changes in the upper
lobes, greatest anterolateral left upper lobe.
Appearance is stable since previous exam.
Changes most likely represent parenchymal lung scarring and minimal
fibrosis.
Remaining lungs show no significant infiltrate.
Very minimal residual interstitial thickening at lateral left lung
base, significantly improved since previous study.
Large bleb at azygo esophageal recess, 3.0 x 2.9 cm image 27.
No definite pulmonary mass, nodule, or bronchiectasis.
IMPRESSION: COPD with chronic peribronchial thickening, scattered areas of
biapical scarring, minimal peripheral fibrosis, and bleb formation.
Improved aeration at left lower lobe versus previous study.
No evidence of developing mass or nodule.

## 2010-07-31 NOTE — H&P (Signed)
NAMEJANAH, [name] NO.:  [id number]   MEDICAL RECORD NO.:  [id number]          PATIENT TYPE:  INP   LOCATION:  NA                           FACILITY:  [facility]   PHYSICIAN:  Ollen Gross, M.D.    DATE OF BIRTH:  1945/06/14   DATE OF ADMISSION:  05/11/2008  DATE OF DISCHARGE:                              HISTORY & PHYSICAL   Date of office visit, history and physical May 03, 2008.   CHIEF COMPLAINT:  Right hip pain.   HISTORY OF PRESENT ILLNESS:  The patient is a 65 year old female who has  been seen by Dr. Lequita Halt for bilateral hip pain right greater than left.  It has been ongoing for several months leading up to the visit.  She has  been seen.  Initially thought to have problems with her back and  buttock.  Seeing a chiropractor, but after several treatments with  little improvement  she was seen by Dr. Fannie Knee in [place].  Felt it was  coming from her hip.  Plain film was unremarkable, but her MRI showed  some high-grade osteonecrosis.  She was referred to Dr. Lequita Halt.  MRI  showed impressive osteonecrosis involving about 75-80% of the femoral  head with small area of collapse.  It has felt she has reached a point  where she would benefit from  undergoing surgical intervention.  Risks  and benefits have been discussed.  She elected to proceed with surgery.  She has been seen preoperatively by Dr. Paulene Floor, and felt to be  stable for surgery.   ALLERGIES:  No known drug allergies.   Questionable allergy to NICKEL, but is able to use hypoallergenic  surgical steel with her earrings.  She will undergo metal testing prior  to surgery.   CURRENT MEDICATIONS:  Humira, pravastatin, lisinopril/HCTZ, full-dose  aspirin, Xanax, ipratropium bromide inhaler solution nebulizer,  albuterol inhaler, vitamin D, fish oil, vitamin C, multivitamin.   PAST MEDICAL HISTORY:  1. Anxiety.  2. Depression.  3. Emphysema.  4. History of bronchitis.  5. History of  pneumonia.  6. Hypertension.  7. Hypercholesterolemia.  8. History of Bright's disease.  9. History of cystitis.  10.Psoriasis.  11.Postmenopausal.   PAST SURGICAL HISTORY:  Tonsils, adenoids.   FAMILY HISTORY:  Father with heart disease with a heart attack.  Mother:  Alzheimer's.  Has an aunt on her father's side with a history of blood  clot.   SOCIAL HISTORY:  Married, homemaker, past smoker, 2 alcoholic drinks a  night.  One child.  She does have a caregiver lined up after surgery.  She has a ramp entering her home, one level home.   REVIEW OF SYSTEMS:  GENERAL:  Occasional night sweats, occasional  fevers, no weight loss or memory loss.  NEUROLOGICAL:  A little bit of  insomnia and weakness.  No seizure or syncope.  RESPIRATORY:  Gets a  little shortness of breath on exertion, but no shortness of breath at  rest.  Occasional cough.  CARDIOVASCULAR:  Has palpitations and  occasional difficulty breathing lying flat, but no chest pain or angina.  GI:  No nausea, vomiting, diarrhea or constipation.  GU:  A little bit  of nocturia.  No dysuria or hematuria.  MUSCULOSKELETAL:  Joint pain.   PHYSICAL EXAMINATION:  VITAL SIGNS:  Pulse 74, respirations 14, blood  pressure 118/64.  GENERAL:  A 65 year old white female, short stature, mildly to  moderately anxious accompanied by her daughter.  HEENT:  Normocephalic, atraumatic.  Pupils are round and reactive.  EOMs  intact.  NECK:  Supple.  CHEST:  Clear except for some inspiratory wheezing.  No rhonchi or  rales.  HEART:  Regular rate and rhythm.  No murmur, S1, S2 noted.  ABDOMEN:  Soft, slightly round.  Bowel sounds are present.  RECTAL, BREASTS, GENITALIA:  Not done and not pertinent to present  illness.  EXTREMITIES:  Right hip flexion 120, internal rotation 30, external  rotation 40, abduction 40.   IMPRESSION:  Avascular necrosis right hip.   PLAN:  The patient will be admitted to [facility] to undergo a   right total hip replacement arthroplasty.  Surgery will be performed by  Dr. Ollen Gross.      Alexzandrew L. Perkins, P.A.C.      Ollen Gross, M.D.  Electronically Signed    ALP/MEDQ  D:  05/10/2008  T:  05/11/2008  Job:  [id number]   cc:   Paulene Floor, M.D.  [facility]

## 2010-07-31 NOTE — Op Note (Signed)
NAMEMONTEZ, STRYKER NO.:  [id number]   MEDICAL RECORD NO.:  [id number]          PATIENT TYPE:  INP   LOCATION:  1537                         FACILITY:  [facility]   PHYSICIAN:  Ollen Gross, M.D.    DATE OF BIRTH:  [birth year]   DATE OF PROCEDURE:  05/11/2008  DATE OF DISCHARGE:                               OPERATIVE REPORT   PREOPERATIVE DIAGNOSIS:  Avascular necrosis right hip.   POSTOPERATIVE DIAGNOSIS:  Avascular necrosis right hip.   PROCEDURE:  Right total hip arthroplasty.   SURGEON:  Ollen Gross, M.D.   ASSISTANT:  Alexzandrew L. Perkins, P.A.C.   ANESTHESIA:  Spinal.   ESTIMATED BLOOD LOSS:  200.   DRAINS:  Hemovac times one.   COMPLICATIONS:  None.   CONDITION:  Stable to recovery.   BRIEF CLINICAL NOTE:  [name] is a 65 year old female with osteonecrosis  of the right hip with progressively worsening pain and dysfunction.  She  has had intractable pain and presents now for right total hip  arthroplasty.  Of note, she said she had a questionable metal allergy so  we tested her with the cobalt chrome disk and it did not show any skin  reaction.  We thus felt that it was safe to proceed with a metal-on-  metal hip replacement.   PROCEDURE IN DETAIL:  After the successful administration of spinal  anesthetic, the patient was placed in the left lateral decubitus  position with the right side up and held with the hip positioner.  The  right lower extremity was isolated from her perineum with plastic drapes  and prepped and draped in the usual sterile fashion.  A small  posterolateral incision is made with a 10 blade through subcutaneous  tissue to the level of the fascia lata which is incised in line with the  skin incision.  The sciatic nerve was palpated and protected and the  short external rotators isolated off the femur.  Capsulectomy is  performed and the hip is dislocated.  Center of the femoral head is  marked and a trial prosthesis  placed such that the center of the trial  head corresponds to the center of the native femoral head.  Osteotomy  line is marked on the femoral neck and osteotomy made with an  oscillating saw.  The femoral head is removed and the femur is retracted  anteriorly to gain acetabular exposure.   Acetabular retractors were placed and the labrum and osteophytes  removed.  Acetabular reaming begins at 43 mm coursing into increments of  49 mm and then a 50 mm Pinnacle acetabular shell was placed in anatomic  position and transfixed with the dome screw.  The apex hole eliminator  is placed and then the 36 mm neutral Ultamet metal liner is placed for  metal-on-metal hip replacement.   The femur is prepared with the canal finder and irrigation.  Axial  reaming is performed up to 11.5 mm, proximal reaming to a 16B and the  sleeve machined to a small.  A 16B small trial sleeve is placed with a  16 x 11 stem  and a 36 plus 6 neck matching native anteversion.  A 36 +  zero head is placed and reduces very easily so we went to a 36 +3 which  had more appropriate soft tissue tension.  There is great stability with  full extension, full external rotation, 70 degrees flexion, 40 degrees  adduction, 90 degrees internal rotation and 90 degrees of flexion and 70  degrees of internal rotation.  By placing the right leg on top of the  left it felt as though the leg lengths were equal.  The hip was then  dislocated.  All trials removed.  Permanent 16B small sleeve is placed,  16 x 11 stem, 36 plus 6 neck matching native anteversion.  A 36 +3 head  is placed and the hip is reduced with the same stability parameters.  Wound is copiously irrigated with saline solution and the short rotators  reattached to the femur through drill holes.  Fascia lata was closed  over Hemovac drain with interrupted #1 Vicryl, subcu closed with #1 and  2-0 Vicryl and subcuticular running 4-0 Monocryl.  The incision is  cleaned and dried  and Steri-Strips and a bulky sterile dressing applied.  She is then placed into a knee immobilizer, awakened and transported to  recovery in stable condition.      Ollen Gross, M.D.  Electronically Signed     FA/MEDQ  D:  05/11/2008  T:  05/12/2008  Job:  [id number]

## 2010-07-31 NOTE — Consult Note (Signed)
[name], NAFF NO.:  [id number]   MEDICAL RECORD NO.:  [id number]          PATIENT TYPE:  INP   LOCATION:  1537                         FACILITY:  [facility]   PHYSICIAN:  Maureen Hillock, MD        DATE OF BIRTH:  Feb 03, 1946   DATE OF CONSULTATION:  05/15/2008  DATE OF DISCHARGE:                                 CONSULTATION   REQUESTED BY:  Dr. Ollen Goodwin.   REASON FOR CONSULTATION:  Postop hypoxemia.   HISTORY OF PRESENT ILLNESS:  The patient is a 65 year old Caucasian  female with a history of multiple comorbid diseases, including chronic  obstructive pulmonary disease, who underwent a right total hip  arthroplasty for avascular necrosis of the right hip on 05/11/2008.  Her  postop course was uneventful until yesterday, when she developed dyspnea  on ambulation and was noted to have a pulse ox of 82% on room air.  Subsequently she was put on oxygen supplementation.  She had a chest x-  ray done, which showed a possible small right pleural effusion and  bibasilar atelectasis.  She has had no significant respiratory distress  since and has been ambulating to the restroom by herself, but continued  to be on oxygen supplementation with saturations in the 90's.  We were  consulted to determine whether she is ready for discharge.  At the time  of this interview, the patient denies any chest pain, shortness of  breath, palpitations.  Has not had any cough, fever, rigors, chills,  abdominal pain, nausea, vomiting, diarrhea, dizziness, loss of  consciousness or any focal weakness of any part of the body.  The  patient states that she uses multiple nebulizers, including Spiriva,  albuterol and ProAir at home.  She has had a diagnosis of chronic  obstructive pulmonary disease for the last five years, and was a smoker  until then.  She also has had episodes of pneumonia and bronchitis in  the past.   PAST MEDICAL HISTORY:  1. Significant anxiety.  2. Depression.  3. Emphysema.  4. Bronchitis/pneumonia.  5. Hypertension.  6. Hypercholesterolemia.  7. History of Bright's disease.  8. History of cystitis.  9. History of psoriasis.   PAST SURGICAL HISTORY:  1. Tonsillectomy/adenoidectomy.  2. Recent right hip arthroplasty.   FAMILY HISTORY:  Significant for coronary artery disease in her father  and her mother had Alzheimer's.  An aunt on her father's side has a  history of blood clots.   SOCIAL HISTORY:  The patient is married.  Is a homemaker.  A past  smoker.  Two alcoholic drinks per day.  Has one child and was  independent in all her ADLs prior to this surgery.   CURRENT MEDICATIONS:  1. Coumadin 5 mg daily.  2. Colace 100 mg p.o. twice daily.  3. Spiriva one inhalation daily.  4. Hydrochlorothiazide 25 mg daily.  5. Simvastatin  20 mg daily.  6. Iron 150 mg daily.  7. Percocet one to two tab p.o. q.4h. p.r.n.  8. Reglan p.o. q.8h. p.r.n.  9. Robaxin 500 mg p.o. q.6h. p.r.n.  10.Temazepam 15 mg p.o. q.h.s. p.r.n.  11.Albuterol inhalation q.6h. p.r.n.  12.Alprazolam 0.25 mg q.8h. p.r.n.  13.Zofran 4 mg q.8h. p.r.n.   ALLERGIES:  No known drug allergies.  There is some question about an  allergy to NICKEL.   PHYSICAL EXAMINATION:  VITAL SIGNS:  Pulse 82, blood pressure 110/71,  respirations 20, oxygen saturation 96% on 2 liters nasal cannula.  GENERAL:  The patient is conscious, alert, oriented to time, place and  person, in no acute distress.  HEENT:  No scleral icterus.  Bilateral ears negative.  Poor dental  hygiene.  NECK:  Supple, no lymphadenopathy, no jugular venous distention, no  carotids bruits.  CHEST:  Breath sounds heard bilaterally.  Poor air entry.  Diminished  breath sounds at the bases, right more than left.  CARDIOVASCULAR:  S1 and S2 plus, regular, no gallop, rub or murmur  appreciated.  ABDOMEN:  Soft, obese, nontender, bowel sounds present.  EXTREMITIES:  No clubbing, cyanosis or edema.  Peripheral pulses  are  present.  NEUROLOGIC:  Cranial nerves II-XII  grossly negative.  No focal, motor  or sensory deficits are noted on Goodwin examination.   LABORATORY DATA:  ABG this morning on room air:  Showed a pH of 7.464,  pCO2 of 51.6, pO2 of 49.6, bicarb 36.5.  Sodium 140, potassium 3.6,  chloride 96, CO2 of 37, glucose 109, BUN 8, creatinine 0.74.  PT 21.5,  INR 1.5.  WBC count 7.2, hemoglobin 9.4, hematocrit 28.4, platelet count  of 215.   A chest x-ray done yesterday revealed chronic obstructive pulmonary  disease, right basilar atelectasis and possible component of a small  right pleural effusion.   IMPRESSION/PLAN:  This is the case of a 65 year old Caucasian female  with a history of chronic obstructive pulmonary disease, hypertension  and hyperlipidemia, who developed postoperative hypoxemia on day three,  status post right hip arthroplasty.  1. Postoperative hypoxemia:  Given the patient's history of chronic      obstructive pulmonary disease, this likely is a reflection of her      baseline disease.  We do not have a baseline ABG on her; however,      based on her elevated bicarbonate level, it is likely that she does      have hypoxemia at baseline.  Other differential diagnoses include a      possible pulmonary embolism or early infiltrate/pneumonia, based on      the chest x-ray findings; however, the patient is afebrile and does      not have a white count at this time.  The patient is essentially      asymptomatic, though she does have some dyspnea on exertion which      is expected at this time.  The plan is to defer discharge for today      and recheck a chest x-ray in the morning, add a steroid      nebulizer/inhaler to her regimen, i.e. Pulmicort.  Put her on a      schedule with albuterol nebulizers and continue her Spiriva.  Will      recheck her blood counts, including a CBC and differential in the      morning, in addition to a chest x-ray and an ABG.  If she remains       stable, she can be safely discharged on home oxygen with followup      with her primary care physician in about one week's time.  There is  always a remote possibility of a pulmonary embolism; however, the      patient is already on anticoagulation and her INR is almost      therapeutic.  We will hold off on doing any further workup on this      patient at this time, as the patient has essentially no clinical      signs/symptoms to warrant further workup at this time.  2. Anemia:  Based on her recent blood counts, the patient likely has      blood loss anemia postop.  Given that she has been started on      anticoagulation with Coumadin, we would like to monitor her      hemoglobin and hematocrit closely and if it remains stable, she can      be safely discharged home on iron supplementation and a followup      with her primary care physician.  3. Deep venous thrombosis/Gastrointestinal prophylaxis:  Protonix and      the patient is already on a therapeutic dose of Coumadin.   Thank you very much for allowing [place] to participate in the care of this  patient.  Please free to call us with any questions.      Maureen Hillock, MD  Electronically Signed     BA/MEDQ  D:  05/15/2008  T:  05/15/2008  Job:  [id number]   cc:   [name], M.D.  Fax: [phone]

## 2010-08-03 NOTE — Discharge Summary (Signed)
[name], [name]              ACCOUNT NO.:  [id number]   MEDICAL RECORD NO.:  [id number]          PATIENT TYPE:  INP   LOCATION:  5527                         FACILITY:  [facility]   PHYSICIAN:  Mobolaji B. Bakare, M.D.DATE OF BIRTH:  1946/03/14   DATE OF ADMISSION:  03/12/2006  DATE OF DISCHARGE:  03/16/2006                               DISCHARGE SUMMARY   PRIMARY CARE PHYSICIAN:  Dr. Olena Leatherwood at [place].   FINAL DIAGNOSES:  1. Chronic obstructive pulmonary disease exacerbation.  2. Hemophilus influenza bacteremia.  3. Ongoing tobacco abuse.  4. Alcohol abuse.  5. Psoriatic arthritis, stable.   PROCEDURES:  1. Chest x-ray done on the 26th of December showed mild interstitial      pulmonary atelectasis of the left lung base.  2. Followup chest x-ray done on the 27th of December, remained      unchanged.  There is mild interstitial prominence and atelectasis      as noted before.   BRIEF HISTORY:  [name] is a 65 year old Caucasian female who has  history of COPD and has been on Advair.  However, she continues to smoke  cigarettes.  She developed hacking cough, productive of yellowish, green  sputum and worsening difficulty with breathing and chest wall pain,  which was with deep breathing.  She was admitted for COPD exacerbation.   HOSPITAL COURSE:  1. COPD exacerbation.  Patient was started on steroid IV Solu-Medrol      and nebulizer.  She apparently has been using nebulizer at home      without much improvement prior to hospitalization.  She could not      remember exactly what she uses, but she says it is only 1 solution.      Anyway, patient made a slow progress.  Antibiotics was also added      to treatment in view of possibility of underlying bacteria      infection, despite no evidence of pneumonia on chest x-ray.      Patient improved gradually and she was stable enough for discharge.      She required oxygen 3-4 liters to maintain her O2 saturations      around  90%.  Patient was tried off oxygen as O2 saturation was 83%      on room air at rest, with exertion O2 saturations was 73% on room      air and she was deemed to require home oxygen.  Patient's symptoms      is improving gradually and she is deemed stable enough to be      discharged home.  She will be followed by home health PT and OT for      rehabilitation.  2. Hemophilus influenza bacteremia.  Blood culture drawn at the time      of admission revealed Hemophilus influenza on 1 set of blood      culture and this is felt to be true bacteremia, which is likely      originating from the respiratory tract.  Patient was afebrile.      White blood cell count is unremarkable.  She would complete 2 weeks      of p.o. Avalox.  3. Ongoing tobacco abuse.  Patient was started on nicotine patch.  She      is willing to quit smoking, but she wants to take it gradually.  I      have recommended smoking cessation counseling.  4. Alcohol abuse.  Patient did not have any sign of alcohol withdrawal      during the course of hospitalization; however, she was placed on      alcohol withdrawal protocol.   DISCHARGE MEDICATIONS:  1. Albuterol 2.5 mg t.i.d. and q.2 hours p.r.n.  2. Atrovent 5 mg t.i.d. and q.2 hours p.r.n.  3. Tessalon 200 mg t.i.d.  4. Mucinex 600 mg b.i.d.  5. Avalox 400 mg daily to complete 9 more days.  6. Nicotine patch 21 mg daily.  7. Prednisone tapering 50 mg to be tapered by 10 mg every 3 days.  8. Thiamine 100 mg daily.  9. Multivitamin 1 daily.  10.Advair 250/50 one puff twice a day.  11.Home oxygen 3-4 liters around the clock.   FOLLOWUP:  With Dr. Olena Leatherwood early next week and outpatient smoking  cessation counseling to be followed.   DISCHARGE CONDITION:  Stable.   DISCHARGE LABORATORY DATA:  Sodium 144, potassium 4.2, chloride 101,  bicarb 29, BUN 17, creatinine 0.7 and calcium 7.0.      Mobolaji B. Corky Downs, M.D.  Electronically Signed     MBB/MEDQ  D:   03/16/2006  T:  03/17/2006  Job:  [id number]

## 2010-08-03 NOTE — Discharge Summary (Signed)
NAMECHERRIL, HETT NO.:  [id number]   MEDICAL RECORD NO.:  [id number]          PATIENT TYPE:  INP   LOCATION:  1537                         FACILITY:  [facility]   PHYSICIAN:  Ollen Gross, M.D.    DATE OF BIRTH:  11-02-1945   DATE OF ADMISSION:  05/11/2008  DATE OF DISCHARGE:  05/16/2008                               DISCHARGE SUMMARY   ADMITTING DIAGNOSES:  1. Avascular necrosis right hip.  2. Anxiety.  3. Depression.  4. Emphysema.  5. History of bronchitis.  6. History of pneumonia.  7. Hypertension.  8. Hypercholesterolemia.  9. History of Bright's disease.  10.History of cystitis.  11.Psoriasis.  12.Postmenopausal.   DISCHARGE DIAGNOSES:  1. Avascular necrosis of the right hip, status post right total hip      replacement arthroplasty.  2. Mild postoperative acute blood loss anemia, did not require      transfusion.  3. Postoperative hypoxemia.  4. Mild postoperative hyponatremia.  5. Mild postoperative hypokalemia.  6. Postoperative atelectasis.   Remaining discharge diagnoses same as admitting diagnoses.   PROCEDURE:  May 11, 2008 right total hip.  Surgeon Dr. Lequita Halt,  assistant Avel Peace, PA-C.  Spinal anesthesia.   CONSULTS:  Medicine services [facility].   BRIEF HISTORY:  [name] is a 65 year old female with osteonecrosis/AVN of  the right hip, progressive worsening pain and dysfunction.  Has had  intractable pain now presents for total hip arthroplasty.   LABORATORY DATA:  Preoperative CBC showed hemoglobin 12.9, hematocrit of  39.1, white cell count 7.8, platelets 376.  Postoperative hemoglobin  10.3, drifted down to 9.3 came back up to 9.9, back down to last known H  and H 9.2 and 27.1.  Blood gas taken on February 28 showed a pH elevated  at 7.464, PCO2 of 51.6, PO2 low at 49.6, bicarb 36.5.  Follow-up blood  gas on May 16, 2008:  pH up a little bit to 7.69, PCO2 down to 24, PO2  was 184 (the patient was on oxygen), bicarb  29.8, PT/INR 12.6 and 0.9  preoperatively with PTT of 23.  Serial ProTimes followed per Coumadin  protocol.  Last known PT/INR 23.2 and 1.9.  Chem panel on admission:  All within normal limits.  Serial BMETs were followed, sodium did drop  from 138 to 134, potassium dropped 3.9 to 3.1.  Sodium was rechecked  outpatient basis, normal, and the potassium was supplemented and  rechecked on outpatient basis.  Preop UA was negative.  Blood group type  A negative.  Right hip films preoperative May 06, 2008 right worse  than left AVN of both hips, lumbar degenerative changes.  Pelvis  May 11, 2008 right hip prosthesis good position right hip film  May 11, 2008 satisfactory repairs of right hip.  Two-view chest  May 14, 2008:  COPD, right basilar atelectasis, possible component  of small right pleural effusion.  Two-view chest May 15, 2008:  Chronic lung changes, streaky bibasilar atelectasis.   HOSPITAL COURSE:  The patient admitted to [facility], taken to  the OR, underwent above-stated procedure without complication.  The  patient tolerated the procedure well, later transferred to the recovery  on orthopedic floor.  Started on PCA and p.o. analgesic pain control  following surgery.  Encouraged pulmonary toilet and incentive spirometer  due to history of bronchitis and pneumonia and also her emphysema.  Postoperative helped keep the lungs clear.  Blood pressure looked good.  She started on Coumadin for DVT prophylaxis.  She had good urine output,  started getting up out of bed.  We did order her nebulizers and  inhalers.  By day two she was doing pretty well, we got discharge  planning involved because she wanted to go home.  She already walked 50  feet on Friday, postoperative day two, there was some possible  consideration she would go home over the weekend.  Hemoglobin was down a  little bit to 9.3, she was asymptomatic with this.  On postoperative day   three she wanted to go home but her O2 sats were a little low so we  checked a chest x-ray which showed some possible atelectasis.  We were  going to let her go home but since it showed possible atelectasis and  pleural effusion we held her discharge just to make sure that she was  stable and continued on the oxygen.  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  She had no shortness of breath, no other symptoms.  No  elevated white count, no temperature at this point.  On postoperative  day four she still had issues with the dropping O2 sats off oxygen, a  medicine consult was called due to some hypoxemia.  It was felt by  medicine services that it was chronic but she may have had a little bit  of acute postoperative, they put her on inhalers and did follow-up chest  x-ray which showed some streaky bibasilar atelectasis.  Her discharge  was held at that point just to ensure improvement.  She did a little bit  better, remained stable.  No clinical signs of PE.  She continued to  progress with physical therapy.  She was seen back on the following day  of May 16, 2008, potassium was down a little bit so we supplemented  that and her sodium was down a little bit also but we would allow that  to titrate up on its own.  She was essentially therapeutic on her  Coumadin with an INR of 1.9.  She was doing well, improved and was  discharged home at that time.   DISCHARGE PLAN:  Discharged home on May 16, 2008.   DISCHARGE DIAGNOSES:  Please see above.   DISCHARGE MEDICATIONS:  Percocet, Robaxin, Coumadin, Restoril.   ACTIVITY:  Partial weightbearing right leg 25% - 50%, hip precautions,  total hip protocol.   DIET:  Heart healthy, low-cholesterol diet.   May start showering, do not submerge incision under water.   DISPOSITION:  Home.   CONDITION UPON  DISCHARGE:  Improved.      [name], P.A.C.      Ollen Gross, M.D.  Electronically Signed    ALP/MEDQ  D:  05/31/2008  T:  05/31/2008  Job:  [id number]   cc:   Ollen Gross, M.D.  Fax: [phone]   Paulene Floor  [facility]

## 2010-08-03 NOTE — H&P (Signed)
[name], [name] NO.:  [id number]   MEDICAL RECORD NO.:  [id number]          PATIENT TYPE:  EMS   LOCATION:  MAJO                         FACILITY:  [facility]   PHYSICIAN:  Lonia Blood, M.D.DATE OF BIRTH:  16-Aug-1945   DATE OF ADMISSION:  03/12/2006  DATE OF DISCHARGE:                              HISTORY & PHYSICAL   PRIMARY CARE PHYSICIAN:  Unassigned.   CHIEF COMPLAINT:  Shortness of breath.   HISTORY OF PRESENT ILLNESS:  [name] is a very pleasant 65-  year-old female who lives in [place], [place] with her husband.  She has a long-standing history of tobacco abuse.  She has a chronic  smoker's cough.  She is not oxygen dependent.  She has not previously  been hospitalized for complications from emphysema.  For the past two  weeks, however, she has had a progressive worsening in a mild baseline  shortness of breath.  For the last one week, she has had a very chronic  hacking cough productive of yellowish-to-greenish sputum.  She has not  had fevers.  She has had orthopnea.  She has had difficulty sleeping  because of her shortness of breath and orthopnea.  Her appetite has been  diminished.  She has continued to smoke.  She complains of bilateral  chest wall pain that is worse when she takes a deep breath or when she  coughs.  There is no substernal type chest pressure.  There is no  tachycardia or sense of palpitations.  This morning her symptoms seemed  even worse than they had been over the last week and she therefore,  presented to the emergency room for evaluation.   REVIEW OF SYSTEMS:  A comprehensive review of systems is unremarkable  with the exception of multiple positive elements included in the history  of present illness above.   PAST MEDICAL HISTORY:  1. COPD/emphysema.  2. Ongoing tobacco abuse.  3. 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  4. Psoriasis.  5. Psoriatic arthritis.   OUTPATIENT  MEDICATIONS:  1. Albuterol neb t.i.d. to q.i.d. p.r.n.  2. Humira dosed subcutaneously two times per month.   ALLERGIES:  NO KNOWN DRUG ALLERGIES.   FAMILY HISTORY:  Noncontributory secondary to age.   SOCIAL HISTORY:  The patient is married.  She does not use illicit  drugs.  [place]  She is a retired  Child psychotherapist.   DATA REVIEW:  Sodium is mildly decreased to 133.  Potassium, chloride,  BUN, and creatinine are all normal.  Serum glucose is elevated at 150.  Calcium is 8.9.  White count is 11.9 which is elevated.  Hemoglobin is  12.8 with an MCV of 96.  Platelets are clumped but appear to be  adequate.  Chest x-ray reveals asymmetric bibasilar interstitial  infiltrate versus edema.   PHYSICAL EXAMINATION:  VITAL SIGNS:  Temperature 98, blood pressure  131/75, heart rate 108, respiratory rate 22, O2 sat is 96% on 3 liters  per minute nasal cannula, and 86-88% on room air.  GENERAL:  Short female in mild  respiratory distress using accessory  muscles for respirations and tachypneic, approximately 30 breaths per  minute.  HEENT:  Normocephalic atraumatic.  Pupils are equal, round, and reactive  to light and accommodation.  Extraocular muscles intact bilaterally.  OC/OP clear.  NECK:  No JVD.  No lymphadenopathy.  LUNGS:  Faint bibasilar crackles, very poor air movement throughout all  fields.  The patient is using accessory muscles for respiration.  Breath  sounds are distant.  There are no focal crackles in the upper fields.  There is very mild expiratory wheeze which is felt to be mild due to the  small volume of air that is moved with each breath.  CARDIOVASCULAR:  Distant heart sounds, tachycardic but regular without  gallop or rub.  ABDOMEN:  Soft.  Bowel sounds present.  No hepatosplenomegaly.  No  rebound.  No ascites.  EXTREMITIES:  No significant cyanosis, clubbing, edema bilateral lower  extremities.  NEUROLOGIC:  Cranial nerves II-XII intact  bilaterally.  Strength 5/5  bilateral upper and lower extremities.  Intact sensation to touch  throughout.  Alert and oriented x3.  No Babinski.   IMPRESSION AND PLAN:  1. Acute chronic obstructive pulmonary disease exacerbation/acute      bronchitis.  [name] appears to be suffering with an acute      bronchitis which has brought about an acute flare of her chronic      obstructive pulmonary disease.  She is using accessory muscles of      respiration and is in mild respiratory distress.  This has improved      significantly with the use of Solu-Medrol and nebulizer treatment      in the emergency room.  She has not, however, recovered to a point      that I feel that she would be safe for discharge home.  I will      admit her to a telemetry unit.  We will continue high dose steroid      treatment.  We will administer albuterol and Atrovent on a standing      basis and then also add as-needed albuterol for acute episodes of      bronchospasm.  We will treat the patient's acute bronchitis with      empiric Rocephin and azithromycin.  We will follow up a chest x-ray      in the morning.  2. Tobacco abuse.  I have counseled the patient extensively as to the      direct connection between her ongoing tobacco abuse and her present      illness.  I have explained to her that her emphysema will continue      to progress should she continue to smoke.  I have advised her that      every cigarette that she smokes from this point forward should be      considered as less time that she will be alive.  I have explained      to her that she must stop immediately.  I will provide a nicotine      patch for her.  I will ask for a tobacco cessation consultation.  3. Hyperglycemia.  The patient does not have a known history of      diabetes mellitus but her serum glucose is significantly elevated      at 150.  I will check a hemoglobin A1c.  If this is elevated, we     will follow CBGs.  The  patient  has been dosed with steroids in the      emergency room but was not on steroids previously and this one time      dose would not have been timed in such a way as to be blamed for      the patient's present hyperglycemia.  4. Psoriasis with psoriatic arthritis.  We will continue the patient's      Humira.  She is not due for another dose until the 1st.  5. Vaccinations.  I have advised the patient that she will benefit      from the pneumonia vaccine and I have also explained to her that      she needs to have the influenza vaccine on a yearly basis.  We will      administer both of these during her hospital stay.      Lonia Blood, M.D.  Electronically Signed    JTM/MEDQ  D:  03/12/2006  T:  03/12/2006  Job:  [id number]

## 2010-12-13 LAB — CREATININE, SERUM
Creatinine, Ser: 1
GFR calc non Af Amer: 56 — ABNORMAL LOW

## 2010-12-13 LAB — BUN: BUN: 8

## 2011-01-08 ENCOUNTER — Telehealth: Payer: Self-pay | Admitting: Emergency Medicine

## 2011-01-08 NOTE — Telephone Encounter (Signed)
Pt states that she cannot afford to keep her oxygen anymore due to cost.  She says [facility] is not covering the entire cost every month and she doesn't have much money.  DME is [facility] and she uses O2 24/7 on 2L cont.  I spoke with [facility] w/ [facility] and she states the pt has a total due right now of $156.84. Her monthly bill is $26.14. This is for her concentrator and compressor. Per Tamela Oddi, she will mail the pt a financial eligibility form to see if they can assist with her co-pay.   I have scheduled the pt for follow-up here with Dr. Delton Coombes on 02/05/2011 and the pt says she cannot afford her copayment. She is aware that I will forward this msg to our office manager, Leonette Monarch, to see if there is anyway to assist the pt with payments. Pt has stayed away because she could not afford her copays.  Pt is aware we will look into assistance and call her back.

## 2011-01-09 NOTE — Telephone Encounter (Signed)
Called patient and discussed her insurance and financial assistance.  She has applied for assistance with [facility] and awaits that decision.  I gave her the phone number for customer service at [facility] so that she can seek assistance.  I also emphasized repeatedly that patient needs to keep her appointments, even though she may not be able to pay her copay.

## 2011-02-05 ENCOUNTER — Ambulatory Visit: Payer: Self-pay | Admitting: Emergency Medicine

## 2011-03-22 ENCOUNTER — Ambulatory Visit: Payer: Self-pay | Admitting: Emergency Medicine

## 2011-04-04 ENCOUNTER — Telehealth: Payer: Self-pay | Admitting: *Deleted

## 2011-04-04 NOTE — Telephone Encounter (Signed)
Pt is rescheduled for follow-up on thurs., 04/11/11 @ 2:15pm with RB.

## 2011-04-04 NOTE — Telephone Encounter (Signed)
Message copied by Gwynneth Albright on Thu Apr 04, 2011 11:16 AM ------      Message from: Michel Bickers A      Created: Mon Feb 18, 2011  4:55 PM       Make sure pt keeps follow-up with RB in January.

## 2011-04-10 ENCOUNTER — Encounter: Payer: Self-pay | Admitting: *Deleted

## 2011-04-11 ENCOUNTER — Encounter: Payer: Self-pay | Admitting: Emergency Medicine

## 2011-04-11 ENCOUNTER — Ambulatory Visit (INDEPENDENT_AMBULATORY_CARE_PROVIDER_SITE_OTHER): Payer: Medicare Other | Admitting: Emergency Medicine

## 2011-04-11 VITALS — BP 130/82 | HR 92 | Temp 98.1°F | Ht 60.0 in | Wt 137.0 lb

## 2011-04-11 DIAGNOSIS — J449 Chronic obstructive pulmonary disease, unspecified: Secondary | ICD-10-CM

## 2011-04-11 NOTE — Patient Instructions (Signed)
Please continue your Spiriva daily Use your DuoNebs or ProAir as needed We will work on getting assistance with payment for your oxygen. If we are unable to do so then we will discuss stopping the oxygen Follow with Dr Delton Coombes in 3 months

## 2011-04-11 NOTE — Assessment & Plan Note (Signed)
-   will work on getting financial assist for O2; if she cannot then we will d/c the O2 against medical advice - continue spiriva - duonebs prn - rov 3 months

## 2011-04-11 NOTE — Progress Notes (Signed)
66 yo woman with hx tobacco, COPD dx [place] in 2000, HTN. Was admitted to [facility] 1/15-1/20/11 for Resp Failure in setting COPD exacerbation and scattered patchy in filtrates on CT scan of the chest. Was treated for CAP with plans to repeat films and insure clearance, consider other etiologies (? COP). Completing her pred taper now, taking alb/atrovent/budesonide nebs since d/c.   ROV 05/17/09 -- Returns for F/U. CT scan done since last visit shows resolution of her PNA in July. Had PFT today as below, shows severe AFL with decreased diffusion (FEV1 0.68L). Has been doing fairly well. Some exertional dyspnea but stable. No exacerbations since last visit.   ROV 04/11/11 -- COPD, severely decreased FEV1. She hasn't been seen in 2 years. Presents today because she is having a lot of trouble paying for the O2. She is on Spiriva, DuoNebs prn, uses about 1x a day.   Gen: Pleasant, elderly woman, in no distress,  normal affect  ENT: No lesions,  mouth clear,  oropharynx clear, no postnasal drip  Neck: No JVD, no TMG, no carotid bruits  Lungs: No use of accessory muscles, distant with bilateral end-exp wheezes  Cardiovascular: RRR, heart sounds normal, no murmur or gallops, no peripheral edema  Musculoskeletal: No deformities, no cyanosis or clubbing  Neuro: alert, non focal  Skin: Warm, no lesions or rashes   COPD - will work on getting financial assist for O2; if she cannot then we will d/c the O2 against medical advice - continue spiriva - duonebs prn - rov 3 months

## 2011-04-22 ENCOUNTER — Telehealth: Payer: Self-pay | Admitting: Emergency Medicine

## 2011-04-22 NOTE — Telephone Encounter (Signed)
Will speak to [name] in the morning to see if forms have been mailed out. [name]

## 2011-04-22 NOTE — Telephone Encounter (Signed)
Pt says she spoke with Alfonso Ramus regarding financial help for her oxygen through [facility]. She is calling to check the status of this. Rhonda, pls advise. Have you been able to speak with anyone at [facility] regarding this matter?

## 2011-04-23 NOTE — Telephone Encounter (Signed)
Called and LMOAM for [name] to check into order that was sent on 04/11/11 to offer patient financial assistance with oxygen. Waiting on response.

## 2011-04-24 NOTE — Telephone Encounter (Signed)
Spoke with [place] at [facility] and she states that [facility] did mail out requested form on 04/03/11. Verified address with patient, which is correct. Asked Mayra Reel to have [facility] to mail out another form and asked patient to call us if she doesn't receive this one.

## 2011-11-06 ENCOUNTER — Other Ambulatory Visit (HOSPITAL_COMMUNITY): Payer: Self-pay | Admitting: Orthopedic Surgery

## 2011-11-06 DIAGNOSIS — M25559 Pain in unspecified hip: Secondary | ICD-10-CM

## 2011-11-07 ENCOUNTER — Ambulatory Visit (HOSPITAL_COMMUNITY)
Admission: RE | Admit: 2011-11-07 | Discharge: 2011-11-07 | Disposition: A | Discharge status: Home / Self Care | Payer: Medicare Other | Source: Ambulatory Visit | Attending: Orthopedic Surgery | Admitting: Orthopedic Surgery

## 2011-11-07 DIAGNOSIS — M87059 Idiopathic aseptic necrosis of unspecified femur: Secondary | ICD-10-CM | POA: Insufficient documentation

## 2011-11-07 DIAGNOSIS — M25559 Pain in unspecified hip: Secondary | ICD-10-CM | POA: Insufficient documentation

## 2011-11-07 DIAGNOSIS — Z96649 Presence of unspecified artificial hip joint: Secondary | ICD-10-CM | POA: Insufficient documentation

## 2011-11-14 ENCOUNTER — Other Ambulatory Visit (HOSPITAL_COMMUNITY): Payer: Self-pay | Admitting: Orthopedic Surgery

## 2011-11-14 DIAGNOSIS — M545 Low back pain: Secondary | ICD-10-CM

## 2011-11-15 ENCOUNTER — Ambulatory Visit (HOSPITAL_COMMUNITY): Payer: Medicare Other

## 2011-11-20 ENCOUNTER — Ambulatory Visit (HOSPITAL_COMMUNITY)
Admission: RE | Admit: 2011-11-20 | Discharge: 2011-11-20 | Disposition: A | Discharge status: Home / Self Care | Payer: Medicare Other | Source: Ambulatory Visit | Attending: Orthopedic Surgery | Admitting: Orthopedic Surgery

## 2011-11-20 DIAGNOSIS — M48061 Spinal stenosis, lumbar region without neurogenic claudication: Secondary | ICD-10-CM | POA: Insufficient documentation

## 2011-11-20 DIAGNOSIS — M25559 Pain in unspecified hip: Secondary | ICD-10-CM | POA: Insufficient documentation

## 2011-11-20 DIAGNOSIS — R209 Unspecified disturbances of skin sensation: Secondary | ICD-10-CM | POA: Insufficient documentation

## 2011-11-20 DIAGNOSIS — M545 Low back pain, unspecified: Secondary | ICD-10-CM | POA: Insufficient documentation

## 2011-11-20 DIAGNOSIS — M5126 Other intervertebral disc displacement, lumbar region: Secondary | ICD-10-CM | POA: Insufficient documentation

## 2012-06-08 ENCOUNTER — Other Ambulatory Visit: Payer: Self-pay | Admitting: Nurse Practitioner

## 2012-06-08 ENCOUNTER — Telehealth: Payer: Self-pay | Admitting: Nurse Practitioner

## 2012-06-08 MED ORDER — FENOFIBRATE 145 MG PO TABS: 145.0000 mg | ORAL_TABLET | Freq: Every day | ORAL | Status: DC

## 2012-06-08 NOTE — Telephone Encounter (Signed)
[facility] called in and ok fro benicar samples

## 2012-06-08 NOTE — Telephone Encounter (Signed)
Samples benicar 40/25. Also rx for tricor generic 145 mg at [facility].

## 2012-06-08 NOTE — Telephone Encounter (Signed)
Samples up front and RX called in

## 2012-06-10 ENCOUNTER — Other Ambulatory Visit: Payer: Self-pay | Admitting: Nurse Practitioner

## 2012-06-10 MED ORDER — HYDROCODONE-ACETAMINOPHEN 7.5-325 MG PO TABS: 1.0000 | ORAL_TABLET | Freq: Three times a day (TID) | ORAL | Status: DC | PRN

## 2012-07-01 ENCOUNTER — Telehealth: Payer: Self-pay | Admitting: *Deleted

## 2012-07-01 MED ORDER — OLMESARTAN MEDOXOMIL-HCTZ 40-25 MG PO TABS: 1.0000 | ORAL_TABLET | Freq: Every day | ORAL | Status: DC

## 2012-07-01 NOTE — Telephone Encounter (Signed)
RX sent in

## 2012-07-01 NOTE — Telephone Encounter (Signed)
Requesting refill of Benicar 40/25. We don't have any samples available. Would like called to [facility] in [place]. Thank you

## 2012-07-02 ENCOUNTER — Other Ambulatory Visit: Payer: Self-pay | Admitting: Nurse Practitioner

## 2012-07-02 NOTE — Telephone Encounter (Signed)
Pt husband aware rx at pharmacy

## 2012-07-05 ENCOUNTER — Other Ambulatory Visit: Payer: Self-pay | Admitting: Nurse Practitioner

## 2012-07-06 ENCOUNTER — Other Ambulatory Visit: Payer: Self-pay | Admitting: Nurse Practitioner

## 2012-07-06 ENCOUNTER — Telehealth: Payer: Self-pay | Admitting: *Deleted

## 2012-07-06 NOTE — Telephone Encounter (Signed)
LMOM [phone] to please come by for RX of Xanax done by MMM.

## 2012-07-06 NOTE — Telephone Encounter (Signed)
LAST REFILL 05/18/12. CALL IN TO [facility]. ROUTE TO NURSE TO CALL IN. LAST OV 12/13 CALL IN TO [facility] [place].

## 2012-07-06 NOTE — Telephone Encounter (Signed)
DONE 4/17/14P

## 2012-07-06 NOTE — Telephone Encounter (Signed)
rx given to patient

## 2012-07-07 NOTE — Telephone Encounter (Signed)
Pt aware,hydrocodone rx ready.

## 2012-07-07 NOTE — Telephone Encounter (Signed)
Let patient know RX ready for pickup.

## 2012-07-07 NOTE — Telephone Encounter (Signed)
Last filled 06/10/12, call into [facility]

## 2012-08-24 ENCOUNTER — Other Ambulatory Visit: Payer: Self-pay | Admitting: Nurse Practitioner

## 2012-08-26 ENCOUNTER — Other Ambulatory Visit: Payer: Self-pay | Admitting: Nurse Practitioner

## 2012-08-26 MED ORDER — HYDROCODONE-ACETAMINOPHEN 7.5-325 MG PO TABS: 1.0000 | ORAL_TABLET | Freq: Three times a day (TID) | ORAL | Status: DC | PRN

## 2012-08-26 NOTE — Telephone Encounter (Signed)
Pt aware - rx up front 

## 2012-08-26 NOTE — Telephone Encounter (Signed)
Last filled 07/20/12, will print have nurse call pt to oick up

## 2012-08-26 NOTE — Telephone Encounter (Signed)
Was denied by error, because is said she was dismissed

## 2012-08-26 NOTE — Addendum Note (Signed)
Addended byDory Peru on: 08/26/2012 01:43 PM   Modules accepted: Orders

## 2012-08-26 NOTE — Telephone Encounter (Signed)
rx ready for pickup 

## 2012-09-02 ENCOUNTER — Telehealth: Payer: Self-pay | Admitting: Nurse Practitioner

## 2012-09-02 NOTE — Telephone Encounter (Signed)
Samples up front 

## 2012-10-05 ENCOUNTER — Other Ambulatory Visit: Payer: Self-pay | Admitting: Nurse Practitioner

## 2012-10-07 NOTE — Telephone Encounter (Signed)
Last seen 02/19/12  MMM   If approved print and have nurse call patient to pick up

## 2012-10-07 NOTE — Telephone Encounter (Signed)
This is okay to refill 

## 2012-10-08 ENCOUNTER — Telehealth: Payer: Self-pay | Admitting: *Deleted

## 2012-10-08 NOTE — Telephone Encounter (Signed)
RX to front for pick up

## 2012-10-08 NOTE — Telephone Encounter (Signed)
Pt notified RX ready for pick up RX to front for Norco

## 2012-10-28 ENCOUNTER — Other Ambulatory Visit: Payer: Self-pay | Admitting: Nurse Practitioner

## 2012-10-30 ENCOUNTER — Other Ambulatory Visit: Payer: Self-pay

## 2012-10-30 MED ORDER — FENOFIBRATE 145 MG PO TABS: 145.0000 mg | ORAL_TABLET | Freq: Every day | ORAL | Status: DC

## 2012-10-30 NOTE — Telephone Encounter (Signed)
rx called into pharmacy

## 2012-10-30 NOTE — Telephone Encounter (Signed)
Last seen 12/13 last filled 07/02/12

## 2012-10-30 NOTE — Telephone Encounter (Signed)
Please call in xanax with 1 refill 

## 2012-10-30 NOTE — Telephone Encounter (Signed)
Last lipids 02/19/12

## 2012-11-09 ENCOUNTER — Other Ambulatory Visit: Payer: Self-pay | Admitting: Family Medicine

## 2012-11-10 NOTE — Telephone Encounter (Signed)
Last seen 12/13  MMM  Last filled 10/05/12  If approved print and route to nurse

## 2012-11-10 NOTE — Telephone Encounter (Signed)
rx ready for pickup 

## 2012-11-10 NOTE — Telephone Encounter (Signed)
PT AWARE TO PICK UP 

## 2012-11-25 ENCOUNTER — Telehealth: Payer: Self-pay | Admitting: Nurse Practitioner

## 2012-11-26 NOTE — Telephone Encounter (Signed)
Samples Up front

## 2012-11-27 ENCOUNTER — Other Ambulatory Visit: Payer: Self-pay | Admitting: *Deleted

## 2012-11-27 MED ORDER — FENOFIBRATE 145 MG PO TABS: 145.0000 mg | ORAL_TABLET | Freq: Every day | ORAL | Status: DC

## 2012-11-27 NOTE — Telephone Encounter (Signed)
Patient notified at last refill that NTBS. Please advise 

## 2012-12-17 ENCOUNTER — Other Ambulatory Visit: Payer: Self-pay | Admitting: Nurse Practitioner

## 2012-12-21 ENCOUNTER — Telehealth: Payer: Self-pay | Admitting: Nurse Practitioner

## 2012-12-21 NOTE — Telephone Encounter (Signed)
Last seen 02/19/12  MMM If approved print and route to nurse

## 2012-12-21 NOTE — Telephone Encounter (Signed)
Up front patient aware 

## 2012-12-21 NOTE — Telephone Encounter (Signed)
rx ready for pick up- no more refills until makes appointment

## 2013-01-01 ENCOUNTER — Telehealth: Payer: Self-pay | Admitting: Nurse Practitioner

## 2013-01-01 NOTE — Telephone Encounter (Signed)
Make appointment to be seen Saturday clinic

## 2013-01-01 NOTE — Telephone Encounter (Signed)
NTBS - hasn't been seen in over 6 months

## 2013-01-01 NOTE — Telephone Encounter (Signed)
Asher Muir can you call and make this patient an appointment

## 2013-01-01 NOTE — Telephone Encounter (Signed)
Has an appointment on the 7 that's the earliest she could get in she does not wont to go the weekend with this stuff

## 2013-01-01 NOTE — Telephone Encounter (Signed)
APPT MADE FOR [facility]

## 2013-01-02 ENCOUNTER — Ambulatory Visit: Payer: Self-pay | Admitting: Family Medicine

## 2013-01-04 ENCOUNTER — Telehealth: Payer: Self-pay | Admitting: Nurse Practitioner

## 2013-01-04 NOTE — Telephone Encounter (Signed)
NTBS.

## 2013-01-05 ENCOUNTER — Encounter (INDEPENDENT_AMBULATORY_CARE_PROVIDER_SITE_OTHER): Payer: Self-pay

## 2013-01-05 ENCOUNTER — Encounter: Payer: Self-pay | Admitting: Nurse Practitioner

## 2013-01-05 ENCOUNTER — Ambulatory Visit (INDEPENDENT_AMBULATORY_CARE_PROVIDER_SITE_OTHER): Payer: Medicare Other | Admitting: Nurse Practitioner

## 2013-01-05 ENCOUNTER — Other Ambulatory Visit: Payer: Self-pay

## 2013-01-05 VITALS — BP 153/85 | HR 112 | Temp 99.5°F | Ht 60.0 in | Wt 136.0 lb

## 2013-01-05 DIAGNOSIS — J441 Chronic obstructive pulmonary disease with (acute) exacerbation: Secondary | ICD-10-CM

## 2013-01-05 MED ORDER — AZITHROMYCIN 250 MG PO TABS: ORAL_TABLET | ORAL | Status: DC

## 2013-01-05 MED ORDER — OLMESARTAN MEDOXOMIL-HCTZ 40-25 MG PO TABS: 1.0000 | ORAL_TABLET | Freq: Every day | ORAL | Status: DC

## 2013-01-05 MED ORDER — METHYLPREDNISOLONE ACETATE 80 MG/ML IJ SUSP
80.0000 mg | Freq: Once | INTRAMUSCULAR | Status: AC
  Administered 2013-01-05: 80 mg via INTRAMUSCULAR

## 2013-01-05 MED ORDER — PREDNISONE 20 MG PO TABS: ORAL_TABLET | ORAL | Status: DC

## 2013-01-05 NOTE — Progress Notes (Signed)
  Subjective:    Patient ID: [name], female    DOB: 1945-06-16, 67 y.o.   MRN: [id number]  HPI  Patient has a log history of COPD- SHe started getting a  Bad cough about 2 weeks ago- nothing OTC is helping. Has had to use her O2 constantly for the last several days.    Review of Systems  Constitutional: Positive for fever. Negative for chills and fatigue.  HENT: Positive for rhinorrhea, sinus pressure and sneezing. Negative for ear pain.   Eyes: Negative.   Respiratory: Positive for cough, shortness of breath and wheezing.   Cardiovascular: Negative.   Genitourinary: Negative.   Musculoskeletal: Negative.        Objective:   Physical Exam  Constitutional: She appears well-developed and well-nourished.  Cardiovascular: Normal rate, regular rhythm and normal heart sounds.   Pulmonary/Chest: Effort normal.  Skin: Skin is warm.  Psychiatric: She has a normal mood and affect. Her behavior is normal. Judgment and thought content normal.    /BP 153/85  Pulse 112  Temp(Src) 99.5 F (37.5 C) (Oral)  Ht 5' (1.524 m)  Wt 136 lb (61.689 kg)  BMI 26.56 kg/m2       Assessment & Plan:   1. COPD exacerbation    Meds ordered this encounter  Medications  . azithromycin (ZITHROMAX) 250 MG tablet    Sig: As directed    Dispense:  6 each    Refill:  0    Order Specific Question:  Supervising Provider    Answer:  Ernestina Penna [[id number]]  . predniSONE (DELTASONE) 20 MG tablet    Sig: 2 po qd X 5 days- start tomorrow    Dispense:  10 tablet    Refill:  0    Order Specific Question:  Supervising Provider    Answer:  Ernestina Penna [[id number]]  . methylPREDNISolone acetate (DEPO-MEDROL) injection 80 mg    Sig:    1. Take meds as prescribed 2. Use a cool mist humidifier especially during the winter months and when heat has  been humid. 3. Use saline nose sprays frequently 4. Saline irrigations of the nose can be very helpful if done frequently.  * 4X daily for 1 week*  * Use  of a nettie pot can be helpful with this. Follow directions with this* 5. Drink plenty of fluids 6. Keep thermostat turn down low 7.For any cough or congestion  Use plain Mucinex- regular strength or max strength is fine   * Children- consult with Pharmacist for dosing 8. For fever or aces or pains- take tylenol or ibuprofen appropriate for age and weight.  * for fevers greater than 101 orally you may alternate ibuprofen and tylenol every  3 hours.   Mary-Margaret Daphine Deutscher, FNP

## 2013-01-05 NOTE — Patient Instructions (Signed)

## 2013-01-05 NOTE — Telephone Encounter (Signed)
Appointment was made for today with mmm

## 2013-01-06 ENCOUNTER — Telehealth: Payer: Self-pay | Admitting: Family Medicine

## 2013-01-07 NOTE — Telephone Encounter (Signed)
Patient not on insulin- she is talking about her humira injection- okay to take injection

## 2013-01-07 NOTE — Telephone Encounter (Signed)
She said she ony gets her insulin twice a month can she go ahead and take it?

## 2013-01-07 NOTE — Telephone Encounter (Signed)
Will it be okay to take her shot of insulin? Also cant take benicar what else can she take needs something cheaper

## 2013-01-07 NOTE — Telephone Encounter (Signed)
Patient aware.

## 2013-01-07 NOTE — Telephone Encounter (Signed)
Continue all of your meds- stop fenofibrate due to cramps

## 2013-01-11 ENCOUNTER — Telehealth: Payer: Self-pay | Admitting: Nurse Practitioner

## 2013-01-12 NOTE — Telephone Encounter (Signed)
Please review

## 2013-01-12 NOTE — Telephone Encounter (Signed)
ok 

## 2013-01-12 NOTE — Telephone Encounter (Signed)
Just continue OTC cough meds- will take several weeks to resolve

## 2013-01-12 NOTE — Telephone Encounter (Signed)
Re-started benicar and cramps in legs came back- FYI Coming in on 01/22/13 for ck up can discuss then  Still having some fever- taking pain med with tylenol in it and asa qd. Breathing better

## 2013-01-22 ENCOUNTER — Encounter: Payer: Self-pay | Admitting: Nurse Practitioner

## 2013-01-22 ENCOUNTER — Ambulatory Visit (INDEPENDENT_AMBULATORY_CARE_PROVIDER_SITE_OTHER): Payer: Medicare Other | Admitting: Nurse Practitioner

## 2013-01-22 ENCOUNTER — Other Ambulatory Visit: Payer: Medicare Other

## 2013-01-22 VITALS — BP 145/85 | HR 94 | Temp 98.4°F | Ht 60.0 in | Wt 132.0 lb

## 2013-01-22 DIAGNOSIS — Z13820 Encounter for screening for osteoporosis: Secondary | ICD-10-CM

## 2013-01-22 DIAGNOSIS — G4700 Insomnia, unspecified: Secondary | ICD-10-CM

## 2013-01-22 DIAGNOSIS — J449 Chronic obstructive pulmonary disease, unspecified: Secondary | ICD-10-CM

## 2013-01-22 DIAGNOSIS — I10 Essential (primary) hypertension: Secondary | ICD-10-CM

## 2013-01-22 DIAGNOSIS — Z23 Encounter for immunization: Secondary | ICD-10-CM

## 2013-01-22 DIAGNOSIS — E785 Hyperlipidemia, unspecified: Secondary | ICD-10-CM

## 2013-01-22 DIAGNOSIS — M8580 Other specified disorders of bone density and structure, unspecified site: Secondary | ICD-10-CM

## 2013-01-22 DIAGNOSIS — M899 Disorder of bone, unspecified: Secondary | ICD-10-CM

## 2013-01-22 MED ORDER — OLMESARTAN MEDOXOMIL 40 MG PO TABS: 40.0000 mg | ORAL_TABLET | Freq: Every day | ORAL | Status: DC

## 2013-01-22 MED ORDER — HYDROCODONE-ACETAMINOPHEN 10-325 MG PO TABS: 1.0000 | ORAL_TABLET | Freq: Three times a day (TID) | ORAL | Status: DC | PRN

## 2013-01-22 NOTE — Progress Notes (Signed)
Subjective:    Patient ID: [name], female    DOB: 05-Apr-1945, 67 y.o.   MRN: [id number]  HPI  Patient tin today for follow up of Chronic medical problems. SHe is doing ok- Only c/o chronic back pain in which pain pills help with some- She says that her COPD is flaring up more frequently but able to get under control with her nebulizer. She also says that the benicar is causing muscle cramps  That are really bad at night. Stopped taking 3 weeks ago. Patient Active Problem List   Diagnosis Date Noted  . 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 Osteopenia 06/16/2010  . Hyperlipidemia 06/16/2010  . HTN (hypertension) 06/16/2010  . Insomnia 06/16/2010  . RA (rheumatoid arthritis) 06/16/2010  . PERTUSSIS 04/20/2009  . Nonspecific (abnormal) findings on radiological and other examination of body structure 04/20/2009  . RHEUMATIC FEVER, HX OF 04/20/2009  . COMPUTERIZED TOMOGRAPHY, CHEST, ABNORMAL 04/20/2009  . COPD 04/18/2009   Outpatient Encounter Prescriptions as of 01/22/2013  Medication Sig  . adalimumab (HUMIRA PEN) 40 MG/0.8ML injection Inject 40 mg into the skin every 14 (fourteen) days.   Marland Kitchen albuterol (2.5 MG/3ML) 0.083% NEBU 3 mL, albuterol (5 MG/ML) 0.5% NEBU 0.5 mL Inhale into the lungs every 4 (four) hours as needed.   . ALPRAZolam (XANAX) 0.5 MG tablet TAKE ONE TABLET BY MOUTH TWICE DAILY  . amitriptyline (ELAVIL) 50 MG tablet Take 50 mg by mouth at bedtime.    Marland Kitchen aspirin 325 MG tablet Take 325 mg by mouth daily.    . Calcium Carbonate-Vitamin D (CALCIUM 600 + D PO) Take by mouth daily.    Marland Kitchen HYDROcodone-acetaminophen (NORCO) 7.5-325 MG per tablet TAKE ONE TABLET BY MOUTH EVERY 8 HOURS AS NEEDED FOR PAIN  . tiotropium (SPIRIVA) 18 MCG inhalation capsule Place 18 mcg into inhaler and inhale daily.    . [DISCONTINUED] albuterol (PROVENTIL) (2.5 MG/3ML) 0.083% nebulizer solution USE ONE VIAL IN NEBULIZER EVERY 6 HOURS  . [DISCONTINUED] azithromycin (ZITHROMAX) 250 MG tablet As directed   . [DISCONTINUED] fenofibrate (TRICOR) 145 MG tablet Take 1 tablet (145 mg total) by mouth daily.  . [DISCONTINUED] ipratropium (ATROVENT) 0.02 % nebulizer solution Take 500 mcg by nebulization every 4 (four) hours as needed.  . [DISCONTINUED] olmesartan-hydrochlorothiazide (BENICAR HCT) 40-25 MG per tablet Take 1 tablet by mouth daily.  . [DISCONTINUED] olmesartan-hydrochlorothiazide (BENICAR HCT) 40-25 MG per tablet Take 1 tablet by mouth daily.  . [DISCONTINUED] predniSONE (DELTASONE) 20 MG tablet 2 po qd X 5 days- start tomorrow       Review of Systems  Constitutional: Negative.   HENT: Negative.   Eyes: Negative.   Respiratory: Negative.   Gastrointestinal: Negative.   Genitourinary: Negative.   Musculoskeletal: Negative.        Objective:   Physical Exam  Constitutional: She is oriented to person, place, and time. She appears well-nourished.  HENT:  Nose: Nose normal.  Mouth/Throat: Oropharynx is clear and moist.  Eyes: EOM are normal.  Neck: Trachea normal, normal range of motion and full passive range of motion without pain. Neck supple. No JVD present. Carotid bruit is not present. No thyromegaly present.  Cardiovascular: Normal rate, regular rhythm, normal heart sounds and intact distal pulses.  Exam reveals no gallop and no friction rub.   No murmur heard. Pulmonary/Chest: Effort normal and breath sounds normal.  Abdominal: Soft. Bowel sounds are normal. She exhibits no distension and no mass. There is no tenderness.  Musculoskeletal: Normal range of motion.  Lymphadenopathy:    She has no cervical adenopathy.  Neurological: She is alert and oriented to person, place, and time. She has normal reflexes.  Skin: Skin is warm and dry.  Psychiatric: She has a normal mood and affect. Her behavior is normal. Judgment and thought content normal.    BP 145/85  Pulse 94  Temp(Src) 98.4 F (36.9 C) (Oral)  Ht 5' (1.524 m)  Wt 132 lb (59.875 kg)  BMI 25.78 kg/m2        Assessment & Plan:   1. Hyperlipidemia   2. HTN (hypertension)   3. Osteoporosis screening   4. COPD   5. Osteopenia   6. Insomnia    Orders Placed This Encounter  Procedures  . DG Bone Density    Standing Status: Future     Number of Occurrences:      Standing Expiration Date: 03/24/2014    Order Specific Question:  Reason for Exam (SYMPTOM  OR DIAGNOSIS REQUIRED)    Answer:  screening    Order Specific Question:  Preferred imaging location?    Answer:  Internal  . CMP14+EGFR  . NMR, lipoprofile   Meds ordered this encounter  Medications  . HYDROcodone-acetaminophen (NORCO) 10-325 MG per tablet    Sig: Take 1 tablet by mouth every 8 (eight) hours as needed.    Dispense:  90 tablet    Refill:  0    Order Specific Question:  Supervising Provider    Answer:  Ernestina Penna [[id number]]  . olmesartan (BENICAR) 40 MG tablet    Sig: Take 1 tablet (40 mg total) by mouth daily.    Dispense:  30 tablet    Refill:  0    Order Specific Question:  Supervising Provider    Answer:  Deborra Medina   Increased hydrocodone dose Stopped benicar /HCTZ- gave plain benicar 40 samples to see if cramps coming from HCTZ- pt will et me know months Continue all meds Labs pending Diet and exercise encouraged Health maintenance reviewed Follow up in 3  Mary-Margaret Daphine Deutscher, [place]

## 2013-01-22 NOTE — Patient Instructions (Signed)
Back Pain, Adult Low back pain is very common. About 1 in 5 people have back pain.The cause of low back pain is rarely dangerous. The pain often gets better over time.About half of people with a sudden onset of back pain feel better in just 2 weeks. About 8 in 10 people feel better by 6 weeks.  CAUSES Some common causes of back pain include:  Strain of the muscles or ligaments supporting the spine.  Wear and tear (degeneration) of the spinal discs.  Arthritis.  Direct injury to the back. DIAGNOSIS Most of the time, the direct cause of low back pain is not known.However, back pain can be treated effectively even when the exact cause of the pain is unknown.Answering your caregiver's questions about your overall health and symptoms is one of the most accurate ways to make sure the cause of your pain is not dangerous. If your caregiver needs more information, he or she may order lab work or imaging tests (X-rays or MRIs).However, even if imaging tests show changes in your back, this usually does not require surgery. HOME CARE INSTRUCTIONS For many people, back pain returns.Since low back pain is rarely dangerous, it is often a condition that people can learn to manageon their own.   Remain active. It is stressful on the back to sit or stand in one place. Do not sit, drive, or stand in one place for more than 30 minutes at a time. Take short walks on level surfaces as soon as pain allows.Try to increase the length of time you walk each day.  Do not stay in bed.Resting more than 1 or 2 days can delay your recovery.  Do not avoid exercise or work.Your body is made to move.It is not dangerous to be active, even though your back may hurt.Your back will likely heal faster if you return to being active before your pain is gone.  Pay attention to your body when you bend and lift. Many people have less discomfortwhen lifting if they bend their knees, keep the load close to their bodies,and  avoid twisting. Often, the most comfortable positions are those that put less stress on your recovering back.  Find a comfortable position to sleep. Use a firm mattress and lie on your side with your knees slightly bent. If you lie on your back, put a pillow under your knees.  Only take over-the-counter or prescription medicines as directed by your caregiver. Over-the-counter medicines to reduce pain and inflammation are often the most helpful.Your caregiver may prescribe muscle relaxant drugs.These medicines help dull your pain so you can more quickly return to your normal activities and healthy exercise.  Put ice on the injured area.  Put ice in a plastic bag.  Place a towel between your skin and the bag.  Leave the ice on for 15-20 minutes, 03-04 times a day for the first 2 to 3 days. After that, ice and heat may be alternated to reduce pain and spasms.  Ask your caregiver about trying back exercises and gentle massage. This may be of some benefit.  Avoid feeling anxious or stressed.Stress increases muscle tension and can worsen back pain.It is important to recognize when you are anxious or stressed and learn ways to manage it.Exercise is a great option. SEEK MEDICAL CARE IF:  You have pain that is not relieved with rest or medicine.  You have pain that does not improve in 1 week.  You have new symptoms.  You are generally not feeling well. SEEK   IMMEDIATE MEDICAL CARE IF:   You have pain that radiates from your back into your legs.  You develop new bowel or bladder control problems.  You have unusual weakness or numbness in your arms or legs.  You develop nausea or vomiting.  You develop abdominal pain.  You feel faint. Document Released: 03/04/2005 Document Revised: 09/03/2011 Document Reviewed: 07/23/2010 Exit[employer] Patient Information 2014 [employer].  

## 2013-01-24 LAB — CMP14+EGFR
ALT: 23 IU/L (ref 0–32)
AST: 27 IU/L (ref 0–40)
Alkaline Phosphatase: 50 IU/L (ref 39–117)
BUN/Creatinine Ratio: 10 — ABNORMAL LOW (ref 11–26)
BUN: 11 mg/dL (ref 8–27)
CO2: 31 mmol/L — ABNORMAL HIGH (ref 18–29)
Chloride: 93 mmol/L — ABNORMAL LOW (ref 97–108)
GFR calc Af Amer: 61 mL/min/{1.73_m2} (ref >59)
Glucose: 93 mg/dL (ref 65–99)
Potassium: 4.2 mmol/L (ref 3.5–5.2)
Total Bilirubin: 0.3 mg/dL (ref 0.0–1.2)
Total Protein: 7.1 g/dL (ref 6.0–8.5)

## 2013-01-24 LAB — NMR, LIPOPROFILE
HDL Cholesterol by NMR: 84 mg/dL (ref >=40)
HDL Particle Number: 48.9 umol/L (ref >=30.5)
LDL Particle Number: 1663 nmol/L — ABNORMAL HIGH (ref <1000)
LDL Size: 21.8 nm (ref >20.5)
LDLC SERPL CALC-MCNC: 132 mg/dL — ABNORMAL HIGH (ref <100)
LP-IR Score: 37 (ref <=45)

## 2013-01-27 ENCOUNTER — Ambulatory Visit (INDEPENDENT_AMBULATORY_CARE_PROVIDER_SITE_OTHER): Payer: Medicare Other | Admitting: Pharmacist

## 2013-01-27 ENCOUNTER — Ambulatory Visit (INDEPENDENT_AMBULATORY_CARE_PROVIDER_SITE_OTHER): Payer: Medicare Other

## 2013-01-27 VITALS — Ht 58.5 in | Wt 144.0 lb

## 2013-01-27 DIAGNOSIS — M899 Disorder of bone, unspecified: Secondary | ICD-10-CM

## 2013-01-27 DIAGNOSIS — Z13820 Encounter for screening for osteoporosis: Secondary | ICD-10-CM

## 2013-01-27 DIAGNOSIS — M8580 Other specified disorders of bone density and structure, unspecified site: Secondary | ICD-10-CM

## 2013-01-27 LAB — SPECIMEN STATUS REPORT

## 2013-01-27 NOTE — Patient Instructions (Signed)

## 2013-01-27 NOTE — Progress Notes (Signed)
Patient ID: [name], female   DOB: December 26, 1945, 67 y.o.   MRN: [id number]   [facility] Current Height: Height: 4' 10.5" (148.6 cm)      Max Lifetime Height:  5 ' 0"  Current Weight: Weight: 144 lb (65.318 kg)       Ethnicity:Caucasian    HPI: Does pt already have a diagnosis of:  Osteopenia?  Yes Osteoporosis?  No  Back Pain?  Yes       Kyphosis?  No Prior fracture?  No Med(s) for Osteoporosis/Osteopenia:  none Med(s) previously tried for Osteoporosis/Osteopenia:  none                                                             PMH: Age at menopause:  28's Hysterectomy?  no Oophorectomy?  No HRT? No Steroid Use?  No Thyroid med?  No History of cancer?  No History of digestive disorders (ie Crohn's)?  No Current or previous eating disorders?  No Last Vitamin D Result:  needed Last GFR Result:  53 (01/22/2013)   FH/SH: Family history of osteoporosis?  Yes - mother Parent with history of hip fracture?  No Family history of breast cancer?  No Exercise?  No Smoking?  No Alcohol?  No    Calcium Assessment Calcium Intake  # of servings/day  Calcium mg  Milk (8 oz) 0  x  300  = 0  Yogurt (4 oz) / cottage cheese  Every other day x  200 = 100mg   Cheese (1 oz) 1 x  200 = 200mg   Other Calcium sources   250mg   Ca supplement Calcium 600mg  daily = 600mg    Estimated calcium intake per day 1150mg     DEXA Results Date of Test T-Score for AP Spine L1-L4 T-Score for Total Left Hip T-Score for Total Right Hip  01/27/2013 1.7 -0.2  (history of right hip replacement)  02/28/2010 1.7 -0.2 --              **T-Score for neck of left hip was -1.1     T-Score for L1 of spine was -1.2   FRAX 10 year estimate: Total FX risk:  8.8%  (consider medication if >/= 20%) Hip FX risk:  0.9%  (consider medication if >/= 3%)  Assessment: osteopenia - stable BMD  Recommendations: 1. Discussed DEXA results and fracture risk 2.  recommend calcium 1200mg  daily through  supplementation or diet.  3.  recommend weight bearing exercise - 30 minutes at least 4 days  per week.   4.  Counseled and educated about fall risk and prevention.  Recheck DEXA:  2 years  Time spent counseling patient:  20 minutes  Henrene Pastor, PharmD, CPP

## 2013-01-28 LAB — VITAMIN D 25 HYDROXY (VIT D DEFICIENCY, FRACTURES): Vit D, 25-Hydroxy: 14.7 ng/mL — ABNORMAL LOW (ref 30.0–100.0)

## 2013-02-05 ENCOUNTER — Telehealth: Payer: Self-pay | Admitting: Nurse Practitioner

## 2013-03-02 ENCOUNTER — Other Ambulatory Visit: Payer: Self-pay | Admitting: Nurse Practitioner

## 2013-03-02 MED ORDER — AZITHROMYCIN 250 MG PO TABS: ORAL_TABLET | ORAL | Status: DC

## 2013-03-04 ENCOUNTER — Telehealth: Payer: Self-pay | Admitting: Nurse Practitioner

## 2013-03-04 MED ORDER — HYDROCODONE-ACETAMINOPHEN 10-325 MG PO TABS: 1.0000 | ORAL_TABLET | Freq: Three times a day (TID) | ORAL | Status: DC | PRN

## 2013-03-04 NOTE — Telephone Encounter (Signed)
Already done

## 2013-03-04 NOTE — Telephone Encounter (Signed)
Patient aware would like for you to call in refill of zpac she will be out in 2 days still not feeling that good

## 2013-03-04 NOTE — Telephone Encounter (Signed)
rx ready for pickup 

## 2013-03-09 ENCOUNTER — Telehealth: Payer: Self-pay | Admitting: Nurse Practitioner

## 2013-03-09 NOTE — Telephone Encounter (Signed)
No will stay in system for 10 days even though only take for 5 days

## 2013-03-15 ENCOUNTER — Other Ambulatory Visit: Payer: Self-pay | Admitting: Nurse Practitioner

## 2013-03-15 ENCOUNTER — Telehealth: Payer: Self-pay | Admitting: Nurse Practitioner

## 2013-03-15 MED ORDER — AZITHROMYCIN 250 MG PO TABS: ORAL_TABLET | ORAL | Status: DC

## 2013-03-15 NOTE — Telephone Encounter (Signed)
patient aware

## 2013-03-19 ENCOUNTER — Telehealth: Payer: Self-pay | Admitting: Nurse Practitioner

## 2013-03-19 NOTE — Telephone Encounter (Signed)
Script was sent to [facility] on 03/15/13 for zpak. Patient wasn't aware.

## 2013-03-31 ENCOUNTER — Other Ambulatory Visit: Payer: Self-pay

## 2013-04-13 ENCOUNTER — Other Ambulatory Visit: Payer: Self-pay | Admitting: Nurse Practitioner

## 2013-04-13 ENCOUNTER — Telehealth: Payer: Self-pay | Admitting: Nurse Practitioner

## 2013-04-13 MED ORDER — HYDROCODONE-ACETAMINOPHEN 10-325 MG PO TABS: 1.0000 | ORAL_TABLET | Freq: Three times a day (TID) | ORAL | Status: DC | PRN

## 2013-04-13 NOTE — Telephone Encounter (Signed)
rx ready for pickup 

## 2013-04-13 NOTE — Telephone Encounter (Signed)
Pt notified and to pickup rx.

## 2013-04-15 ENCOUNTER — Telehealth: Payer: Self-pay | Admitting: *Deleted

## 2013-04-15 NOTE — Telephone Encounter (Signed)
Patient last seen in office on 01-22-13. Rx last filled on 03/12/13. Please advise. If approved please route to Pool B so nurse can phone in to pharmacy

## 2013-04-15 NOTE — Telephone Encounter (Signed)
Please call in xanax rx with 1 refill 

## 2013-04-15 NOTE — Telephone Encounter (Signed)
Patient NTBS to discuss all of this

## 2013-04-15 NOTE — Telephone Encounter (Signed)
Called to [facility] 

## 2013-04-15 NOTE — Telephone Encounter (Signed)
Talked with [name] today she currently is not taking benicar because of cramps, she wishes you to prescribe metoprolol (a friend in [facility] suggested it).  She is taking spiriva instead of iprabropium bromide, she is using albuterol in nebulizer and pro air inhaler for rescue breathing.. She uses [facility] for her rxs. Let me know if you need something else.

## 2013-04-19 ENCOUNTER — Other Ambulatory Visit: Payer: Self-pay

## 2013-04-21 ENCOUNTER — Telehealth: Payer: Self-pay | Admitting: Nurse Practitioner

## 2013-04-22 ENCOUNTER — Encounter: Payer: Self-pay | Admitting: Nurse Practitioner

## 2013-04-22 ENCOUNTER — Ambulatory Visit (INDEPENDENT_AMBULATORY_CARE_PROVIDER_SITE_OTHER): Payer: Medicare HMO | Admitting: Nurse Practitioner

## 2013-04-22 VITALS — BP 166/83 | HR 89 | Temp 97.3°F | Wt 131.0 lb

## 2013-04-22 DIAGNOSIS — I10 Essential (primary) hypertension: Secondary | ICD-10-CM

## 2013-04-22 MED ORDER — METOPROLOL TARTRATE 50 MG PO TABS: 50.0000 mg | ORAL_TABLET | Freq: Two times a day (BID) | ORAL | Status: DC

## 2013-04-22 MED ORDER — ALBUTEROL SULFATE (2.5 MG/3ML) 0.083% IN NEBU: 2.5000 mg | INHALATION_SOLUTION | Freq: Four times a day (QID) | RESPIRATORY_TRACT | Status: DC | PRN

## 2013-04-22 NOTE — Telephone Encounter (Signed)
Advised patient that she NTBS for med changes. Gave her appt for today because meds are almost out

## 2013-04-22 NOTE — Progress Notes (Signed)
   Subjective:    Patient ID: [name], female    DOB: [birth year], 68 y.o.   MRN: [id number]  HPI Patient in today to recheck blood pressure- she stopped taking her benicar because it was causing cramps. SInce stopping benicar she has had no cramps- SHe is on nothing currnetly.    Review of Systems  Constitutional: Negative.   HENT: Negative.   Eyes: Negative.   Cardiovascular: Negative.   All other systems reviewed and are negative.       Objective:   Physical Exam  Constitutional: She is oriented to person, place, and time. She appears well-developed and well-nourished.  Cardiovascular: Normal rate and normal heart sounds.   Pulmonary/Chest: Effort normal and breath sounds normal.  Musculoskeletal: Normal range of motion.  Neurological: She is alert and oriented to person, place, and time.  Psychiatric: She has a normal mood and affect. Her behavior is normal. Judgment and thought content normal.   BP 166/83  Pulse 89  Temp(Src) 97.3 F (36.3 C) (Oral)  Wt 131 lb (59.421 kg)  SpO2 98%        Assessment & Plan:   1. Hypertension    Meds ordered this encounter  Medications  . metoprolol (LOPRESSOR) 50 MG tablet    Sig: Take 1 tablet (50 mg total) by mouth 2 (two) times daily.    Dispense:  180 tablet    Refill:  3    Order Specific Question:  Supervising Provider    Answer:  Chipper Herb [[id number]]  . albuterol (PROVENTIL) (2.5 MG/3ML) 0.083% nebulizer solution    Sig: Take 3 mLs (2.5 mg total) by nebulization every 6 (six) hours as needed for wheezing or shortness of breath.    Dispense:  150 mL    Refill:  1    Order Specific Question:  Supervising Provider    Answer:  Chipper Herb [[id number]]   Will give bystolic samples to take until metoprolol comes from mail order NO salt in diet RTO prn  Mary-Margaret Hassell Done, FNP

## 2013-04-22 NOTE — Patient Instructions (Signed)

## 2013-04-27 ENCOUNTER — Telehealth: Payer: Self-pay | Admitting: Nurse Practitioner

## 2013-04-28 MED ORDER — AZITHROMYCIN 250 MG PO TABS: ORAL_TABLET | ORAL | Status: DC

## 2013-04-28 NOTE — Telephone Encounter (Signed)
rx sent to [facility]

## 2013-04-29 NOTE — Telephone Encounter (Signed)
Aware,rx ready at pharmacy.

## 2013-04-30 ENCOUNTER — Telehealth: Payer: Self-pay | Admitting: Nurse Practitioner

## 2013-04-30 ENCOUNTER — Other Ambulatory Visit: Payer: Self-pay | Admitting: *Deleted

## 2013-04-30 NOTE — Telephone Encounter (Signed)
No samples Please write an Rx to [facility]

## 2013-04-30 NOTE — Telephone Encounter (Signed)
Also received request for proair [facility] but do not see on med list. Please advise

## 2013-05-02 MED ORDER — AMITRIPTYLINE HCL 50 MG PO TABS: 50.0000 mg | ORAL_TABLET | Freq: Every day | ORAL | Status: DC

## 2013-05-02 NOTE — Telephone Encounter (Signed)
No samples 

## 2013-05-03 ENCOUNTER — Other Ambulatory Visit: Payer: Self-pay

## 2013-05-03 ENCOUNTER — Telehealth: Payer: Self-pay | Admitting: Nurse Practitioner

## 2013-05-03 MED ORDER — ALBUTEROL SULFATE HFA 108 (90 BASE) MCG/ACT IN AERS: 2.0000 | INHALATION_SPRAY | Freq: Four times a day (QID) | RESPIRATORY_TRACT | Status: DC | PRN

## 2013-05-03 MED ORDER — ALPRAZOLAM 0.5 MG PO TABS: 0.5000 mg | ORAL_TABLET | Freq: Two times a day (BID) | ORAL | Status: DC

## 2013-05-03 NOTE — Telephone Encounter (Signed)
rx sent to pharmacy

## 2013-05-03 NOTE — Telephone Encounter (Signed)
Taken care of in another encounter 

## 2013-05-03 NOTE — Telephone Encounter (Signed)
Last seen 04/22/13  MMM   This is for mail order

## 2013-05-03 NOTE — Addendum Note (Signed)
Addended by: Ilean China on: 05/03/2013 12:07 PM   Modules accepted: Orders

## 2013-05-03 NOTE — Telephone Encounter (Signed)
Please call in xanax with 1 refills 

## 2013-05-04 ENCOUNTER — Telehealth: Payer: Self-pay | Admitting: *Deleted

## 2013-05-04 NOTE — Telephone Encounter (Signed)
Patient told needs to be seen- had z pak called in on 04/28/13

## 2013-05-04 NOTE — Telephone Encounter (Signed)
Patient complaining with chills, cough, fever and wants a zpack called in please

## 2013-05-06 ENCOUNTER — Telehealth: Payer: Self-pay | Admitting: Nurse Practitioner

## 2013-05-06 NOTE — Telephone Encounter (Signed)
[name] check on this please

## 2013-05-06 NOTE — Telephone Encounter (Signed)
Gina please check on this

## 2013-05-17 ENCOUNTER — Telehealth: Payer: Self-pay | Admitting: Nurse Practitioner

## 2013-05-17 ENCOUNTER — Emergency Department (HOSPITAL_COMMUNITY): Payer: Medicare HMO

## 2013-05-17 ENCOUNTER — Emergency Department (HOSPITAL_COMMUNITY)
Admission: EM | Admit: 2013-05-17 | Discharge: 2013-05-18 | Disposition: A | Discharge status: Home / Self Care | Payer: Medicare HMO | Attending: Emergency Medicine | Admitting: Emergency Medicine

## 2013-05-17 ENCOUNTER — Encounter (HOSPITAL_COMMUNITY): Payer: Self-pay | Admitting: Emergency Medicine

## 2013-05-17 DIAGNOSIS — F411 Generalized anxiety disorder: Secondary | ICD-10-CM | POA: Insufficient documentation

## 2013-05-17 DIAGNOSIS — J209 Acute bronchitis, unspecified: Secondary | ICD-10-CM | POA: Insufficient documentation

## 2013-05-17 DIAGNOSIS — Z87891 Personal history of nicotine dependence: Secondary | ICD-10-CM | POA: Insufficient documentation

## 2013-05-17 DIAGNOSIS — Z872 Personal history of diseases of the skin and subcutaneous tissue: Secondary | ICD-10-CM | POA: Insufficient documentation

## 2013-05-17 DIAGNOSIS — R0902 Hypoxemia: Secondary | ICD-10-CM | POA: Insufficient documentation

## 2013-05-17 DIAGNOSIS — I10 Essential (primary) hypertension: Secondary | ICD-10-CM | POA: Insufficient documentation

## 2013-05-17 DIAGNOSIS — Z79899 Other long term (current) drug therapy: Secondary | ICD-10-CM | POA: Insufficient documentation

## 2013-05-17 DIAGNOSIS — J440 Chronic obstructive pulmonary disease with acute lower respiratory infection: Secondary | ICD-10-CM | POA: Insufficient documentation

## 2013-05-17 DIAGNOSIS — J441 Chronic obstructive pulmonary disease with (acute) exacerbation: Secondary | ICD-10-CM

## 2013-05-17 DIAGNOSIS — Z7982 Long term (current) use of aspirin: Secondary | ICD-10-CM | POA: Insufficient documentation

## 2013-05-17 LAB — CBC WITH DIFFERENTIAL/PLATELET
Basophils Absolute: 0 10*3/uL (ref 0.0–0.1)
Basophils Relative: 0 % (ref 0–1)
EOS PCT: 3 % (ref 0–5)
Eosinophils Absolute: 0.2 10*3/uL (ref 0.0–0.7)
HCT: 43.2 % (ref 36.0–46.0)
Hemoglobin: 13.5 g/dL (ref 12.0–15.0)
LYMPHS ABS: 1.7 10*3/uL (ref 0.7–4.0)
LYMPHS PCT: 28 % (ref 12–46)
MCH: 33.7 pg (ref 26.0–34.0)
MCHC: 31.3 g/dL (ref 30.0–36.0)
MCV: 107.7 fL — ABNORMAL HIGH (ref 78.0–100.0)
MONOS PCT: 9 % (ref 3–12)
Monocytes Absolute: 0.5 10*3/uL (ref 0.1–1.0)
Neutro Abs: 3.5 10*3/uL (ref 1.7–7.7)
Neutrophils Relative %: 59 % (ref 43–77)
PLATELETS: 134 10*3/uL — AB (ref 150–400)
RBC: 4.01 MIL/uL (ref 3.87–5.11)
RDW: 12.5 % (ref 11.5–15.5)
WBC: 5.9 10*3/uL (ref 4.0–10.5)

## 2013-05-17 LAB — BASIC METABOLIC PANEL
BUN: 7 mg/dL (ref 6–23)
CALCIUM: 11.3 mg/dL — AB (ref 8.4–10.5)
Chloride: 89 mEq/L — ABNORMAL LOW (ref 96–112)
Creatinine, Ser: 0.75 mg/dL (ref 0.50–1.10)
GFR calc Af Amer: >90 mL/min (ref >90)
GFR calc non Af Amer: 84 mL/min — ABNORMAL LOW (ref >90)
GLUCOSE: 154 mg/dL — AB (ref 70–99)
POTASSIUM: 4 meq/L (ref 3.7–5.3)
SODIUM: 141 meq/L (ref 137–147)

## 2013-05-17 LAB — I-STAT CG4 LACTIC ACID, ED: Lactic Acid, Venous: 1.01 mmol/L (ref 0.5–2.2)

## 2013-05-17 LAB — I-STAT TROPONIN, ED: Troponin i, poc: 0.01 ng/mL (ref 0.00–0.08)

## 2013-05-17 MED ORDER — PREDNISONE 20 MG PO TABS: ORAL_TABLET | ORAL | Status: DC

## 2013-05-17 MED ORDER — ALPRAZOLAM 0.5 MG PO TABS
0.5000 mg | ORAL_TABLET | Freq: Once | ORAL | Status: AC
  Administered 2013-05-17: 0.5 mg via ORAL
  Filled 2013-05-17: qty 1

## 2013-05-17 MED ORDER — IPRATROPIUM BROMIDE 0.02 % IN SOLN
0.5000 mg | Freq: Once | RESPIRATORY_TRACT | Status: AC
  Administered 2013-05-17: 0.5 mg via RESPIRATORY_TRACT
  Filled 2013-05-17: qty 2.5

## 2013-05-17 MED ORDER — DOXYCYCLINE HYCLATE 100 MG PO TABS
100.0000 mg | ORAL_TABLET | Freq: Once | ORAL | Status: DC
  Filled 2013-05-17: qty 1

## 2013-05-17 MED ORDER — HYDROCODONE-ACETAMINOPHEN 5-325 MG PO TABS
2.0000 | ORAL_TABLET | Freq: Once | ORAL | Status: AC
  Administered 2013-05-17: 2 via ORAL
  Filled 2013-05-17: qty 2

## 2013-05-17 MED ORDER — DOXYCYCLINE HYCLATE 100 MG PO CAPS: 100.0000 mg | ORAL_CAPSULE | Freq: Two times a day (BID) | ORAL | Status: DC

## 2013-05-17 MED ORDER — PREDNISONE 50 MG PO TABS
60.0000 mg | ORAL_TABLET | Freq: Once | ORAL | Status: AC
  Administered 2013-05-17: 60 mg via ORAL
  Filled 2013-05-17 (×2): qty 1

## 2013-05-17 MED ORDER — ALBUTEROL (5 MG/ML) CONTINUOUS INHALATION SOLN
10.0000 mg/h | INHALATION_SOLUTION | Freq: Once | RESPIRATORY_TRACT | Status: AC
  Administered 2013-05-17: 10 mg/h via RESPIRATORY_TRACT
  Filled 2013-05-17: qty 20

## 2013-05-17 NOTE — ED Notes (Signed)
Pt. On breathing treatment. Daughter at bedside. Pt. C/o chronic pain in hips. Pt. appears anxious. Pt. Reports that she has not taken her rx pain or anxiety medication tonight. EDP notified.

## 2013-05-17 NOTE — Telephone Encounter (Signed)
Patient daughter stated that patients o2 dropped to 59 with oxygen off and then 88 with oxygen on and right now it is hold at 94 with oxygen back on. Patients daughter advised to take her to the ER for evaluation.

## 2013-05-17 NOTE — ED Notes (Signed)
CRITICAL VALUE ALERT  Critical value received: CO2 greater than 45  Date of notification:  05/17/13  Time of notification:  2100  Critical value read back: Yes  Nurse who received alert:  Elwanda Brooklyn, RN  Responding MD:  Dr. Stevie Kern  Time MD responded:  [id number]

## 2013-05-17 NOTE — ED Notes (Signed)
Per EMS: pt c/o SOb since last night. Used at home breathing treatment with no relief. Has been coughing up green phlegm. Per EMS: pt was using accessory muscles, O2 at 88% on home used 2L/min. Pt at 100% on non-rebreather.

## 2013-05-17 NOTE — Discharge Instructions (Signed)
°  RETURN IMMEDIATELY IF you develop worse shortness of breath, confusion or altered mental status, a new rash, become dizzy, faint, or poorly responsive, or are unable to be cared for at home. °

## 2013-05-17 NOTE — ED Notes (Signed)
Dr. Stevie Kern at bedside upon arrival.

## 2013-05-17 NOTE — ED Provider Notes (Signed)
CSN: [id number]     Arrival date & time 05/17/13  1936 History  This chart was scribed for Babette Relic, MD by Anastasia Pall, ED Scribe. This patient was seen in room 01 and the patient's care was started at 7:33 PM.   Chief Complaint  Patient presents with  . Shortness of Breath   (Consider location/radiation/quality/duration/timing/severity/associated sxs/prior Treatment) The history is provided by the patient and the EMS personnel. No language interpreter was used.   HPI Comments: [name] is a 68 y.o. female with h/o COPD, who presents to the Emergency Department complaining of gradually worsening SOB, wheezing, and productive cough, onset 24 hours ago. She states her symptoms were not improving with her 2 L of O2 at home. Pulse Ox was mildly hypoxic 88% at home, states baseline is 90%. She reports having inhaler and nebulizer at home. She denies vomiting, fever, chest pain, rash, and any other associated symptoms. ED RN noticed Pt's home O2 tank is empty. Pt did not realize she had run out of O2.  Past Medical History  Diagnosis Date  . HTN (hypertension)   . COPD (chronic obstructive pulmonary disease)   . Anxiety disorder   . Hyperglycemia   . Psoriasis   . Avascular necrosis of hip     left  . ETOH abuse    Past Surgical History  Procedure Laterality Date  . Total hip arthroplasty  05-15-08    r hip   Family History  Problem Relation Age of Onset  . Heart disease Father   . Hyperlipidemia Father   . Cancer Paternal Grandfather   . Alzheimer's disease Mother    History  Substance Use Topics  . Smoking status: Former Smoker -- 1.00 packs/day for 35 years    Quit date: 02/15/2009  . Smokeless tobacco: Not on file  . Alcohol Use: Yes     Comment: hx of abuse and withdrawal   OB History   Grav Para Term Preterm Abortions TAB SAB Ect Mult Living                 Review of Systems A complete 10 system review of systems was obtained and all systems are negative  except as noted in the HPI and PMH.   Allergies  Nickel and Tramadol hcl  Home Medications   Current Outpatient Rx  Name  Route  Sig  Dispense  Refill  . albuterol (PROVENTIL) (2.5 MG/3ML) 0.083% nebulizer solution   Nebulization   Take 3 mLs (2.5 mg total) by nebulization every 6 (six) hours as needed for wheezing or shortness of breath.   150 mL   1   . ALPRAZolam (XANAX) 0.5 MG tablet   Oral   Take 1 tablet (0.5 mg total) by mouth 2 (two) times daily.   180 tablet   0   . amitriptyline (ELAVIL) 50 MG tablet   Oral   Take 50 mg by mouth at bedtime as needed for sleep.         Marland Kitchen aspirin 325 MG tablet   Oral   Take 325 mg by mouth daily.           . cetirizine (ALL DAY ALLERGY) 10 MG tablet   Oral   Take 10 mg by mouth daily.         . cholecalciferol (VITAMIN D) 1000 UNITS tablet   Oral   Take 1,000 Units by mouth daily.         Marland Kitchen EQL  CALCIUM CITRATE/VITAMIN D PO   Oral   Take by mouth. Vitamin D 500IU with Calcium Citrate 630mg          . guaifenesin (MUCUS RELIEF) 400 MG TABS tablet   Oral   Take 400 mg by mouth daily as needed (for ocngestion).         Marland Kitchen HYDROcodone-acetaminophen (NORCO) 10-325 MG per tablet   Oral   Take 1 tablet by mouth every 8 (eight) hours as needed.   90 tablet   0   . metoprolol (LOPRESSOR) 50 MG tablet   Oral   Take 1 tablet (50 mg total) by mouth 2 (two) times daily.   180 tablet   3   . tiotropium (SPIRIVA) 18 MCG inhalation capsule   Inhalation   Place 18 mcg into inhaler and inhale every morning.          Marland Kitchen adalimumab (HUMIRA PEN) 40 MG/0.8ML injection   Subcutaneous   Inject 40 mg into the skin every 14 (fourteen) days.          Marland Kitchen albuterol (PROAIR HFA) 108 (90 BASE) MCG/ACT inhaler   Inhalation   Inhale 2 puffs into the lungs every 6 (six) hours as needed for wheezing or shortness of breath.   3 Inhaler   1   . doxycycline (VIBRAMYCIN) 100 MG capsule   Oral   Take 1 capsule (100 mg total) by  mouth 2 (two) times daily. One po bid x 7 days   14 capsule   0   . predniSONE (DELTASONE) 20 MG tablet      2 tabs po daily x 4 days   8 tablet   0    BP 190/84  Pulse 71  Resp 32  SpO2 100%  Physical Exam  Nursing note and vitals reviewed. Constitutional:  Awake, alert, nontoxic appearance.  HENT:  Head: Atraumatic.  Eyes: Right eye exhibits no discharge. Left eye exhibits no discharge.  Neck: Neck supple.  Cardiovascular: Normal rate, regular rhythm and normal heart sounds.  Exam reveals no gallop and no friction rub.   No murmur heard. Pulmonary/Chest: Effort normal. No accessory muscle usage. No respiratory distress. She has wheezes. She has rales. She exhibits no tenderness.  Moderate respiratory distress at rest. Respiratory hypoxia prior to arrival 88%, increased to 100% with mask. Decreased breath sounds bilateral bases with faint inspiratory wheezes and crackles at left base. EMS reports at their arrival pt had respiratory distress, retractions, and accessory muscle use. After arrival to ED no retractions, no accessory muscle use.   Abdominal: Soft. There is no tenderness. There is no rebound.  Musculoskeletal: She exhibits no edema and no tenderness.  Baseline ROM, no obvious new focal weakness.  Neurological:  Mental status and motor strength appears baseline for patient and situation.  Skin: No rash noted.  Psychiatric: She has a normal mood and affect.    ED Course  Procedures (including critical care time)  DIAGNOSTIC STUDIES: Oxygen Saturation is 100% on room air, normal by my interpretation.    COORDINATION OF CARE: 7:39 PM- Patient / Family / Caregiver understand and agree with initial ED impression and plan with expectations set for ED visit.  Pulse Ox back to baseline 93% on 2.5L nasal O2 in ED; Pt wants discharge; daughter agrees. EMS to transport Pt home since needs continuous O2 and no portable O2 available. 2355  Results for orders placed during  the hospital encounter of 05/17/13  CBC WITH DIFFERENTIAL  Result Value Ref Range   WBC 5.9  4.0 - 10.5 K/uL   RBC 4.01  3.87 - 5.11 MIL/uL   Hemoglobin 13.5  12.0 - 15.0 g/dL   HCT 43.2  36.0 - 46.0 %   MCV 107.7 (*) 78.0 - 100.0 fL   MCH 33.7  26.0 - 34.0 pg   MCHC 31.3  30.0 - 36.0 g/dL   RDW 12.5  11.5 - 15.5 %   Platelets 134 (*) 150 - 400 K/uL   Neutrophils Relative % 59  43 - 77 %   Neutro Abs 3.5  1.7 - 7.7 K/uL   Lymphocytes Relative 28  12 - 46 %   Lymphs Abs 1.7  0.7 - 4.0 K/uL   Monocytes Relative 9  3 - 12 %   Monocytes Absolute 0.5  0.1 - 1.0 K/uL   Eosinophils Relative 3  0 - 5 %   Eosinophils Absolute 0.2  0.0 - 0.7 K/uL   Basophils Relative 0  0 - 1 %   Basophils Absolute 0.0  0.0 - 0.1 K/uL  BASIC METABOLIC PANEL      Result Value Ref Range   Sodium 141  137 - 147 mEq/L   Potassium 4.0  3.7 - 5.3 mEq/L   Chloride 89 (*) 96 - 112 mEq/L   CO2 >45 (*) 19 - 32 mEq/L   Glucose, Bld 154 (*) 70 - 99 mg/dL   BUN 7  6 - 23 mg/dL   Creatinine, Ser 0.75  0.50 - 1.10 mg/dL   Calcium 11.3 (*) 8.4 - 10.5 mg/dL   GFR calc non Af Amer 84 (*) >90 mL/min   GFR calc Af Amer >90  >90 mL/min  I-STAT TROPOININ, ED      Result Value Ref Range   Troponin i, poc 0.01  0.00 - 0.08 ng/mL   Comment 3           I-STAT CG4 LACTIC ACID, ED      Result Value Ref Range   Lactic Acid, Venous 1.01  0.5 - 2.2 mmol/L   Dg Chest Port 1 View  05/17/2013   CLINICAL DATA:  Hypoxia and dyspnea with history of COPD and previous tobacco use  EXAM: PORTABLE CHEST - 1 VIEW  COMPARISON:  DG DEXA AXIAL SKELETON - NRPT [facility] dated 01/27/2013; DG CHEST 2 VIEW dated 04/03/2009  FINDINGS: The lungs are mildly hyperinflated. The interstitial markings are increased bilaterally and have become more conspicuous since the previous study. There is blunting of the costophrenic angles bilaterally but most prominently on the right. The cardiac silhouette is top-normal in size. The pulmonary vascularity is not  engorged. There is mild prominence of the mediastinum but this is an AP projection.  IMPRESSION: The findings may reflect pneumonia superimposed upon pulmonary fibrosis and COPD. No definite alveolar infiltrate is seen. There is a small right pleural effusion and trace left pleural effusion. When the patient can tolerate the procedure, a PA and lateral chest x-ray or chest CT scan may be useful.   Electronically Signed   By: David  Martinique   On: 05/17/2013 20:29    EKG Interpretation   Date/Time:  Monday May 17 2013 19:45:28 EST Ventricular Rate:  69 PR Interval:  160 QRS Duration: 94 QT Interval:  380 QTC Calculation: 407 R Axis:   76 Text Interpretation:  Normal sinus rhythm with sinus arrhythmia Normal ECG  When compared with ECG of 31-Mar-2009 13:35, Fusion complexes are no  longer Present  Premature ventricular complexes are no longer Present Vent.  rate has decreased BY  64 BPM Confirmed by [facility]  MD, Jenny Reichmann ([id number]) on  05/17/2013 8:16:27 PM     Medications  albuterol (PROVENTIL,VENTOLIN) solution continuous neb (10 mg/hr Nebulization Given 05/17/13 1944)  ipratropium (ATROVENT) nebulizer solution 0.5 mg (0.5 mg Nebulization Given 05/17/13 1945)  predniSONE (DELTASONE) tablet 60 mg (60 mg Oral Given 05/17/13 2019)  HYDROcodone-acetaminophen (NORCO/VICODIN) 5-325 MG per tablet 2 tablet (2 tablets Oral Given 05/17/13 2020)  ALPRAZolam Duanne Moron) tablet 0.5 mg (0.5 mg Oral Given 05/17/13 2019)   MDM   Final diagnoses:  COPD with acute exacerbation  Hypoxia   I doubt any other EMC precluding discharge at this time including, but not necessarily limited to the following:sepsis, ACS.  I personally performed the services described in this documentation, which was scribed in my presence. The recorded information has been reviewed and is accurate.    Babette Relic, MD 05/20/13 2038

## 2013-05-17 NOTE — Telephone Encounter (Signed)
ok 

## 2013-05-18 MED ORDER — AMMONIA AROMATIC IN INHA
RESPIRATORY_TRACT | Status: AC
  Filled 2013-05-18: qty 10

## 2013-05-19 ENCOUNTER — Other Ambulatory Visit: Payer: Self-pay

## 2013-05-19 MED ORDER — ALBUTEROL SULFATE HFA 108 (90 BASE) MCG/ACT IN AERS: 2.0000 | INHALATION_SPRAY | Freq: Four times a day (QID) | RESPIRATORY_TRACT | Status: DC | PRN

## 2013-05-20 ENCOUNTER — Telehealth: Payer: Self-pay | Admitting: Nurse Practitioner

## 2013-05-21 MED ORDER — ALBUTEROL SULFATE HFA 108 (90 BASE) MCG/ACT IN AERS: 2.0000 | INHALATION_SPRAY | Freq: Four times a day (QID) | RESPIRATORY_TRACT | Status: DC | PRN

## 2013-05-21 NOTE — Telephone Encounter (Signed)
done

## 2013-05-24 ENCOUNTER — Telehealth: Payer: Self-pay | Admitting: Nurse Practitioner

## 2013-05-24 MED ORDER — HYDROCODONE-ACETAMINOPHEN 10-325 MG PO TABS: 1.0000 | ORAL_TABLET | Freq: Three times a day (TID) | ORAL | Status: DC | PRN

## 2013-05-24 MED ORDER — ALPRAZOLAM 0.5 MG PO TABS: 0.5000 mg | ORAL_TABLET | Freq: Two times a day (BID) | ORAL | Status: DC

## 2013-05-24 NOTE — Telephone Encounter (Signed)
rx ready for pick up Hydrocodone and xanax

## 2013-05-24 NOTE — Telephone Encounter (Signed)
Pt aware rx ready for pick up. 

## 2013-05-25 ENCOUNTER — Other Ambulatory Visit: Payer: Self-pay | Admitting: Nurse Practitioner

## 2013-05-25 MED ORDER — ALPRAZOLAM 0.5 MG PO TABS: 0.5000 mg | ORAL_TABLET | Freq: Two times a day (BID) | ORAL | Status: DC

## 2013-05-26 ENCOUNTER — Telehealth: Payer: Self-pay | Admitting: Nurse Practitioner

## 2013-05-26 NOTE — Telephone Encounter (Signed)
done

## 2013-05-28 ENCOUNTER — Telehealth: Payer: Self-pay | Admitting: Nurse Practitioner

## 2013-05-28 NOTE — Telephone Encounter (Signed)
ER visit for COPD exacerbation last week. Given doxycycline and symptoms have improved some. She was told to follow up with [place] this week but she hasn't scheduled an appt. Only remaining symptom is chills. Denies fever. Appt scheduled for 05/31/13 with Shelah Lewandowsky. She will seek care sooner if symptoms worsen.

## 2013-05-31 ENCOUNTER — Encounter: Payer: Self-pay | Admitting: Nurse Practitioner

## 2013-05-31 ENCOUNTER — Ambulatory Visit (INDEPENDENT_AMBULATORY_CARE_PROVIDER_SITE_OTHER): Payer: Medicare HMO | Admitting: Nurse Practitioner

## 2013-05-31 ENCOUNTER — Encounter (INDEPENDENT_AMBULATORY_CARE_PROVIDER_SITE_OTHER): Payer: Self-pay

## 2013-05-31 VITALS — BP 108/60 | HR 82 | Temp 98.3°F | Ht 58.5 in | Wt 126.8 lb

## 2013-05-31 DIAGNOSIS — Z09 Encounter for follow-up examination after completed treatment for conditions other than malignant neoplasm: Secondary | ICD-10-CM

## 2013-05-31 DIAGNOSIS — J441 Chronic obstructive pulmonary disease with (acute) exacerbation: Secondary | ICD-10-CM

## 2013-05-31 NOTE — Progress Notes (Signed)
   Subjective:    Patient ID: ARYN SAFRAN, female    DOB: [birth year], 68 y.o.   MRN: [id number]  [facility] a [facility] They gave her prednisone and antibiotic and albuterol neb treatments- SHe is doing much better today- No SOB- still on O2 constantly via nasal cannula. Current SAO2 90% but is currently on conservative, when at home it stays around 94-95%.     Review of Systems  Constitutional: Negative.   HENT: Negative.   Respiratory: Positive for cough and shortness of breath (no  more then usual).   Cardiovascular: Negative.   Gastrointestinal: Negative.   Genitourinary: Negative.   Musculoskeletal: Negative.   Hematological: Negative.   Psychiatric/Behavioral: Negative.   All other systems reviewed and are negative.       Objective:   Physical Exam  Constitutional: She appears well-developed and well-nourished.  HENT:  Right Ear: External ear normal.  Left Ear: External ear normal.  Nose: Nose normal.  Mouth/Throat: Oropharynx is clear and moist.  Eyes: Pupils are equal, round, and reactive to light.  Neck: Normal range of motion. Neck supple.  Cardiovascular: Normal rate, regular rhythm and normal heart sounds.   Pulmonary/Chest: Effort normal and breath sounds normal.   BP 108/60  Pulse 82  Temp(Src) 98.3 F (36.8 C) (Oral)  Ht 4' 10.5" (1.486 m)  Wt 126 lb 12.8 oz (57.516 kg)  BMI 26.05 kg/m2  SpO2 90%  Deep breathes- SAO@ goes to 92%      Assessment & Plan:   1. COPD exacerbation   2. Hospital discharge follow-up   conmtinuous o2 at 2 l at home Keep check of o2 sat at home Continue all meds as rx Follow up in 3 days  [place], FNP

## 2013-05-31 NOTE — Patient Instructions (Signed)
Chronic Obstructive Pulmonary Disease  Chronic obstructive pulmonary disease (COPD) is a common lung condition in which airflow from the lungs is limited. COPD is a general term that can be used to describe many different lung problems that limit airflow, including both chronic bronchitis and emphysema.  If you have COPD, your lung function will probably never return to normal, but there are measures you can take to improve lung function and make yourself feel better.   CAUSES   · Smoking (common).    · Exposure to secondhand smoke.    · Genetic problems.  · Chronic inflammatory lung diseases or recurrent infections.  SYMPTOMS   · Shortness of breath, especially with physical activity.    · Deep, persistent (chronic) cough with a large amount of thick mucus.    · Wheezing.    · Rapid breaths (tachypnea).    · Gray or bluish discoloration (cyanosis) of the skin, especially in fingers, toes, or lips.    · Fatigue.    · Weight loss.    · Frequent infections or episodes when breathing symptoms become much worse (exacerbations).    · Chest tightness.  DIAGNOSIS   Your healthcare provider will take a medical history and perform a physical examination to make the initial diagnosis.  Additional tests for COPD may include:   · Lung (pulmonary) function tests.  · Chest X-ray.  · CT scan.  · Blood tests.  TREATMENT   Treatment available to help you feel better when you have COPD include:   · Inhaler and nebulizer medicines. These help manage the symptoms of COPD and make your breathing more comfortable  · Supplemental oxygen. Supplemental oxygen is only helpful if you have a low oxygen level in your blood.    · Exercise and physical activity. These are beneficial for nearly all people with COPD. Some people may also benefit from a pulmonary rehabilitation program.  HOME CARE INSTRUCTIONS   · Take all medicines (inhaled or pills) as directed by your health care provider.  · Only take over-the-counter or prescription medicines  for pain, fever, or discomfort as directed by your health care provider.    · Avoid over-the-counter medicines or cough syrups that dry up your airway (such as antihistamines) and slow down the elimination of secretions unless instructed otherwise by your healthcare provider.    · If you are a smoker, the most important thing that you can do is stop smoking. Continuing to smoke will cause further lung damage and breathing trouble. Ask your health care provider for help with quitting smoking. He or she can direct you to community resources or hospitals that provide support.  · Avoid exposure to irritants such as smoke, chemicals, and fumes that aggravate your breathing.  · Use oxygen therapy and pulmonary rehabilitation if directed by your health care provider. If you require home oxygen therapy, ask your healthcare provider whether you should purchase a pulse oximeter to measure your oxygen level at home.    · Avoid contact with individuals who have a contagious illness.  · Avoid extreme temperature and humidity changes.  · Eat healthy foods. Eating smaller, more frequent meals and resting before meals may help you maintain your strength.  · Stay active, but balance activity with periods of rest. Exercise and physical activity will help you maintain your ability to do things you want to do.  · Preventing infection and hospitalization is very important when you have COPD. Make sure to receive all the vaccines your health care provider recommends, especially the pneumococcal and influenza vaccines. Ask your healthcare provider whether you   need a pneumonia vaccine.  · Learn and use relaxation techniques to manage stress.  · Learn and use controlled breathing techniques as directed by your health care provider. Controlled breathing techniques include:    · Pursed lip breathing. Start by breathing in (inhaling) through your nose for 1 second. Then, purse your lips as if you were going to whistle and breathe out (exhale)  through the pursed lips for 2 seconds.    · Diaphragmatic breathing. Start by putting one hand on your abdomen just above your waist. Inhale slowly through your nose. The hand on your abdomen should move out. Then purse your lips and exhale slowly. You should be able to feel the hand on your abdomen moving in as you exhale.    · Learn and use controlled coughing to clear mucus from your lungs. Controlled coughing is a series of short, progressive coughs. The steps of controlled coughing are:    1. Lean your head slightly forward.    2. Breathe in deeply using diaphragmatic breathing.    3. Try to hold your breath for 3 seconds.    4. Keep your mouth slightly open while coughing twice.    5. Spit any mucus out into a tissue.    6. Rest and repeat the steps once or twice as needed.  SEEK MEDICAL CARE IF:   · You are coughing up more mucus than usual.    · There is a change in the color or thickness of your mucus.    · Your breathing is more labored than usual.    · Your breathing is faster than usual.    SEEK IMMEDIATE MEDICAL CARE IF:   · You have shortness of breath while you are resting.    · You have shortness of breath that prevents you from:  · Being able to talk.    · Performing your usual physical activities.    · You have chest pain lasting longer than 5 minutes.    · Your skin color is more cyanotic than usual.  · You measure low oxygen saturations for longer than 5 minutes with a pulse oximeter.  MAKE SURE YOU:   · Understand these instructions.  · Will watch your condition.  · Will get help right away if you are not doing well or get worse.  Document Released: 12/12/2004 Document Revised: 12/23/2012 Document Reviewed: 10/29/2012  [employer]® Patient Information ©2014 [employer].

## 2013-06-02 ENCOUNTER — Telehealth: Payer: Self-pay | Admitting: Nurse Practitioner

## 2013-06-02 MED ORDER — ALBUTEROL SULFATE HFA 108 (90 BASE) MCG/ACT IN AERS: 2.0000 | INHALATION_SPRAY | Freq: Four times a day (QID) | RESPIRATORY_TRACT | Status: DC | PRN

## 2013-06-02 NOTE — Telephone Encounter (Signed)
Waiting for mmm to sign

## 2013-06-02 NOTE — Telephone Encounter (Signed)
rx ready for pick up-have to pick up and mail yourself

## 2013-06-03 ENCOUNTER — Encounter: Payer: Self-pay | Admitting: Nurse Practitioner

## 2013-06-03 ENCOUNTER — Other Ambulatory Visit: Payer: Self-pay | Admitting: *Deleted

## 2013-06-03 ENCOUNTER — Ambulatory Visit (INDEPENDENT_AMBULATORY_CARE_PROVIDER_SITE_OTHER): Payer: Medicare HMO | Admitting: Nurse Practitioner

## 2013-06-03 VITALS — BP 125/67 | HR 74 | Temp 97.8°F | Ht 58.5 in | Wt 126.4 lb

## 2013-06-03 DIAGNOSIS — J449 Chronic obstructive pulmonary disease, unspecified: Secondary | ICD-10-CM

## 2013-06-03 DIAGNOSIS — IMO0001 Reserved for inherently not codable concepts without codable children: Secondary | ICD-10-CM

## 2013-06-03 MED ORDER — ALBUTEROL SULFATE HFA 108 (90 BASE) MCG/ACT IN AERS: 2.0000 | INHALATION_SPRAY | Freq: Four times a day (QID) | RESPIRATORY_TRACT | Status: DC | PRN

## 2013-06-03 NOTE — Progress Notes (Signed)
   Subjective:    Patient ID: [name], female    DOB: 10-19-1945, 68 y.o.   MRN: [id number]  HPI Patient was seen on 05/31/13 for hospital follow up of COPD exacerbation- SHe was doing much better. O2 sat was in low 90's at last visit and i had threatened to send her to the hospital but she refused to go. SHe is on continuous o2 at home via cannula.    Review of Systems  Constitutional: Negative.   HENT: Negative.   Respiratory: Positive for shortness of breath (bo more then usual). Cough: slight.   Cardiovascular: Negative.   Gastrointestinal: Negative.   Genitourinary: Negative.        Objective:   Physical Exam  Constitutional: She is oriented to person, place, and time. She appears well-developed and well-nourished.  Cardiovascular: Normal rate, regular rhythm and normal heart sounds.   Pulmonary/Chest: Effort normal and breath sounds normal.  O 2 sat 95% 2l O2 via [place]  Neurological: She is alert and oriented to person, place, and time.  Skin: Skin is warm and dry.  Psychiatric: She has a normal mood and affect. Her behavior is normal. Judgment and thought content normal.    BP 125/67  Pulse 74  Temp(Src) 97.8 F (36.6 C) (Oral)  Ht 4' 10.5" (1.486 m)  Wt 126 lb 6.4 oz (57.335 kg)  BMI 25.96 kg/m2  SpO2 94%       Assessment & Plan:   1. COPD bronchitis    Continue all meds Continue O2 continously Follow up in 3 months and prn  Mary-Margaret Hassell Done, FNP

## 2013-06-03 NOTE — Telephone Encounter (Signed)
Aware ,script ready. 

## 2013-06-03 NOTE — Patient Instructions (Signed)
Chronic Obstructive Pulmonary Disease  Chronic obstructive pulmonary disease (COPD) is a common lung condition in which airflow from the lungs is limited. COPD is a general term that can be used to describe many different lung problems that limit airflow, including both chronic bronchitis and emphysema.  If you have COPD, your lung function will probably never return to normal, but there are measures you can take to improve lung function and make yourself feel better.   CAUSES   · Smoking (common).    · Exposure to secondhand smoke.    · Genetic problems.  · Chronic inflammatory lung diseases or recurrent infections.  SYMPTOMS   · Shortness of breath, especially with physical activity.    · Deep, persistent (chronic) cough with a large amount of thick mucus.    · Wheezing.    · Rapid breaths (tachypnea).    · Gray or bluish discoloration (cyanosis) of the skin, especially in fingers, toes, or lips.    · Fatigue.    · Weight loss.    · Frequent infections or episodes when breathing symptoms become much worse (exacerbations).    · Chest tightness.  DIAGNOSIS   Your healthcare provider will take a medical history and perform a physical examination to make the initial diagnosis.  Additional tests for COPD may include:   · Lung (pulmonary) function tests.  · Chest X-ray.  · CT scan.  · Blood tests.  TREATMENT   Treatment available to help you feel better when you have COPD include:   · Inhaler and nebulizer medicines. These help manage the symptoms of COPD and make your breathing more comfortable  · Supplemental oxygen. Supplemental oxygen is only helpful if you have a low oxygen level in your blood.    · Exercise and physical activity. These are beneficial for nearly all people with COPD. Some people may also benefit from a pulmonary rehabilitation program.  HOME CARE INSTRUCTIONS   · Take all medicines (inhaled or pills) as directed by your health care provider.  · Only take over-the-counter or prescription medicines  for pain, fever, or discomfort as directed by your health care provider.    · Avoid over-the-counter medicines or cough syrups that dry up your airway (such as antihistamines) and slow down the elimination of secretions unless instructed otherwise by your healthcare provider.    · If you are a smoker, the most important thing that you can do is stop smoking. Continuing to smoke will cause further lung damage and breathing trouble. Ask your health care provider for help with quitting smoking. He or she can direct you to community resources or hospitals that provide support.  · Avoid exposure to irritants such as smoke, chemicals, and fumes that aggravate your breathing.  · Use oxygen therapy and pulmonary rehabilitation if directed by your health care provider. If you require home oxygen therapy, ask your healthcare provider whether you should purchase a pulse oximeter to measure your oxygen level at home.    · Avoid contact with individuals who have a contagious illness.  · Avoid extreme temperature and humidity changes.  · Eat healthy foods. Eating smaller, more frequent meals and resting before meals may help you maintain your strength.  · Stay active, but balance activity with periods of rest. Exercise and physical activity will help you maintain your ability to do things you want to do.  · Preventing infection and hospitalization is very important when you have COPD. Make sure to receive all the vaccines your health care provider recommends, especially the pneumococcal and influenza vaccines. Ask your healthcare provider whether you   need a pneumonia vaccine.  · Learn and use relaxation techniques to manage stress.  · Learn and use controlled breathing techniques as directed by your health care provider. Controlled breathing techniques include:    · Pursed lip breathing. Start by breathing in (inhaling) through your nose for 1 second. Then, purse your lips as if you were going to whistle and breathe out (exhale)  through the pursed lips for 2 seconds.    · Diaphragmatic breathing. Start by putting one hand on your abdomen just above your waist. Inhale slowly through your nose. The hand on your abdomen should move out. Then purse your lips and exhale slowly. You should be able to feel the hand on your abdomen moving in as you exhale.    · Learn and use controlled coughing to clear mucus from your lungs. Controlled coughing is a series of short, progressive coughs. The steps of controlled coughing are:    1. Lean your head slightly forward.    2. Breathe in deeply using diaphragmatic breathing.    3. Try to hold your breath for 3 seconds.    4. Keep your mouth slightly open while coughing twice.    5. Spit any mucus out into a tissue.    6. Rest and repeat the steps once or twice as needed.  SEEK MEDICAL CARE IF:   · You are coughing up more mucus than usual.    · There is a change in the color or thickness of your mucus.    · Your breathing is more labored than usual.    · Your breathing is faster than usual.    SEEK IMMEDIATE MEDICAL CARE IF:   · You have shortness of breath while you are resting.    · You have shortness of breath that prevents you from:  · Being able to talk.    · Performing your usual physical activities.    · You have chest pain lasting longer than 5 minutes.    · Your skin color is more cyanotic than usual.  · You measure low oxygen saturations for longer than 5 minutes with a pulse oximeter.  MAKE SURE YOU:   · Understand these instructions.  · Will watch your condition.  · Will get help right away if you are not doing well or get worse.  Document Released: 12/12/2004 Document Revised: 12/23/2012 Document Reviewed: 10/29/2012  [employer]® Patient Information ©2014 [employer].

## 2013-06-09 ENCOUNTER — Telehealth: Payer: Self-pay | Admitting: Nurse Practitioner

## 2013-06-09 NOTE — Telephone Encounter (Signed)
What new med is she talking about?

## 2013-06-09 NOTE — Telephone Encounter (Signed)
Metoprolol. She started this on 04/23/13.  Denies pain in her legs but does have swelling.  Appt scheduled for tomorrow.

## 2013-06-10 ENCOUNTER — Encounter: Payer: Self-pay | Admitting: Nurse Practitioner

## 2013-06-10 ENCOUNTER — Ambulatory Visit (INDEPENDENT_AMBULATORY_CARE_PROVIDER_SITE_OTHER): Payer: Medicare HMO | Admitting: Nurse Practitioner

## 2013-06-10 VITALS — BP 135/71 | Temp 98.2°F | Ht 58.5 in | Wt 128.0 lb

## 2013-06-10 DIAGNOSIS — R609 Edema, unspecified: Secondary | ICD-10-CM

## 2013-06-10 DIAGNOSIS — F3289 Other specified depressive episodes: Secondary | ICD-10-CM

## 2013-06-10 DIAGNOSIS — F329 Major depressive disorder, single episode, unspecified: Secondary | ICD-10-CM

## 2013-06-10 DIAGNOSIS — F32A Depression, unspecified: Secondary | ICD-10-CM

## 2013-06-10 DIAGNOSIS — I509 Heart failure, unspecified: Secondary | ICD-10-CM | POA: Insufficient documentation

## 2013-06-10 MED ORDER — FUROSEMIDE 20 MG PO TABS: 20.0000 mg | ORAL_TABLET | Freq: Every day | ORAL | Status: DC

## 2013-06-10 MED ORDER — CITALOPRAM HYDROBROMIDE 20 MG PO TABS: 20.0000 mg | ORAL_TABLET | Freq: Every day | ORAL | Status: DC

## 2013-06-10 NOTE — Telephone Encounter (Signed)
Patient thought that she had taken 2 pain pills and 2 xanax 0.5mg - They called provider on call- when I called them back she had actually not taken pain pills like she thought she did- Has appointment this morning to see me at 9:45 anyway. SDpoke with husband and he is bringing her to her appointment.

## 2013-06-10 NOTE — Progress Notes (Signed)
   Subjective:    Patient ID: [name], female    DOB: Oct 25, 1945, 68 y.o.   MRN: [id number]  HPI Patient in c/o swelling bilateral feet and hands- started 2 days ago- she is not on a fluid pill- some SOB but doesn't seem to be any worse then normal.  * Daughter says that she thinks mom is depressed and needs something to help her  Review of Systems  Constitutional: Negative.   HENT: Negative.   Respiratory: Positive for shortness of breath and wheezing.   Cardiovascular: Positive for leg swelling.  Gastrointestinal: Negative.   Genitourinary: Negative.   Musculoskeletal: Negative.   Neurological: Negative.   All other systems reviewed and are negative.       Objective:   Physical Exam  Constitutional: She is oriented to person, place, and time. She appears well-developed and well-nourished.  Cardiovascular: Normal rate, regular rhythm and normal heart sounds.   Pulmonary/Chest: Effort normal. She has rales (fine bil lower lobes).  Musculoskeletal: Normal range of motion.  Neurological: She is alert and oriented to person, place, and time.  Psychiatric: She has a normal mood and affect. Her behavior is normal. Judgment and thought content normal.   BP 135/71  Temp(Src) 98.2 F (36.8 C) (Oral)  Ht 4' 10.5" (1.486 m)  Wt 128 lb (58.06 kg)  BMI 26.29 kg/m2  See PQ9      Assessment & Plan:  1. Peripheral edema Elevate legs when sitting - furosemide (LASIX) 20 MG tablet; Take 1 tablet (20 mg total) by mouth daily.  Dispense: 30 tablet; Refill: 3  2. CHF (congestive heart failure) Weigh daily and keep diary- if gain >2lbs in 1 day call - furosemide (LASIX) 20 MG tablet; Take 1 tablet (20 mg total) by mouth daily.  Dispense: 30 tablet; Refill: 3 - BMP8+EGFR - Brain natriuretic peptide  3. Depression Side effects reviewed - citalopram (CELEXA) 20 MG tablet; Take 1 tablet (20 mg total) by mouth daily.  Dispense: 30 tablet; Refill: 3  Follow up  In 1  month  [place], FNP

## 2013-06-10 NOTE — Patient Instructions (Signed)
Heart Failure °Heart failure is a condition in which the heart has trouble pumping blood. This means your heart does not pump blood efficiently for your body to work well. In some cases of heart failure, fluid may back up into your lungs or you may have swelling (edema) in your lower legs. Heart failure is usually a long-term (chronic) condition. It is important for you to take good care of yourself and follow your caregiver's treatment plan. °CAUSES  °Some health conditions can cause heart failure. Those health conditions include: °· High blood pressure (hypertension) causes the heart muscle to work harder than normal. When pressure in the blood vessels is high, the heart needs to pump (contract) with more force in order to circulate blood throughout the body. High blood pressure eventually causes the heart to become stiff and weak. °· Coronary artery disease (CAD) is the buildup of cholesterol and fat (plaque) in the arteries of the heart. The blockage in the arteries deprives the heart muscle of oxygen and blood. This can cause chest pain and may lead to a heart attack. High blood pressure can also contribute to CAD. °· Heart attack (myocardial infarction) occurs when 1 or more arteries in the heart become blocked. The loss of oxygen damages the muscle tissue of the heart. When this happens, part of the heart muscle dies. The injured tissue does not contract as well and weakens the heart's ability to pump blood. °· Abnormal heart valves can cause heart failure when the heart valves do not open and close properly. This makes the heart muscle pump harder to keep the blood flowing. °· Heart muscle disease (cardiomyopathy or myocarditis) is damage to the heart muscle from a variety of causes. These can include drug or alcohol abuse, infections, or unknown reasons. These can increase the risk of heart failure. °· Lung disease makes the heart work harder because the lungs do not work properly. This can cause a strain  on the heart, leading it to fail. °· Diabetes increases the risk of heart failure. High blood sugar contributes to high fat (lipid) levels in the blood. Diabetes can also cause slow damage to tiny blood vessels that carry important nutrients to the heart muscle. When the heart does not get enough oxygen and food, it can cause the heart to become weak and stiff. This leads to a heart that does not contract efficiently. °· Other conditions can contribute to heart failure. These include abnormal heart rhythms, thyroid problems, and low blood counts (anemia). °Certain unhealthy behaviors can increase the risk of heart failure. Those unhealthy behaviors include: °· Being overweight. °· Smoking or chewing tobacco. °· Eating foods high in fat and cholesterol. °· Abusing illicit drugs or alcohol. °· Lacking physical activity. °SYMPTOMS  °Heart failure symptoms may vary and can be hard to detect. Symptoms may include: °· Shortness of breath with activity, such as climbing stairs. °· Persistent cough. °· Swelling of the feet, ankles, legs, or abdomen. °· Unexplained weight gain. °· Difficulty breathing when lying flat (orthopnea). °· Waking from sleep because of the need to sit up and get more air. °· Rapid heartbeat. °· Fatigue and loss of energy. °· Feeling lightheaded, dizzy, or close to fainting. °· Loss of appetite. °· Nausea. °· Increased urination during the night (nocturia). °DIAGNOSIS  °A diagnosis of heart failure is based on your history, symptoms, physical examination, and diagnostic tests. °Diagnostic tests for heart failure may include: °· Echocardiography. °· Electrocardiography. °· Chest X-ray. °· Blood tests. °· Exercise   stress test. °· Cardiac angiography. °· Radionuclide scans. °TREATMENT  °Treatment is aimed at managing the symptoms of heart failure. Medicines, behavioral changes, or surgical intervention may be necessary to treat heart failure. °· Medicines to help treat heart failure may  include: °· Angiotensin-converting enzyme (ACE) inhibitors. This type of medicine blocks the effects of a blood protein called angiotensin-converting enzyme. ACE inhibitors relax (dilate) the blood vessels and help lower blood pressure. °· Angiotensin receptor blockers. This type of medicine blocks the actions of a blood protein called angiotensin. Angiotensin receptor blockers dilate the blood vessels and help lower blood pressure. °· Water pills (diuretics). Diuretics cause the kidneys to remove salt and water from the blood. The extra fluid is removed through urination. This loss of extra fluid lowers the volume of blood the heart pumps. °· Beta blockers. These prevent the heart from beating too fast and improve heart muscle strength. °· Digitalis. This increases the force of the heartbeat. °· Healthy behavior changes include: °· Obtaining and maintaining a healthy weight. °· Stopping smoking or chewing tobacco. °· Eating heart healthy foods. °· Limiting or avoiding alcohol. °· Stopping illicit drug use. °· Physical activity as directed by your caregiver. °· Surgical treatment for heart failure may include: °· A procedure to open blocked arteries, repair damaged heart valves, or remove damaged heart muscle tissue. °· A pacemaker to improve heart muscle function and control certain abnormal heart rhythms. °· An internal cardioverter defibrillator to treat certain serious abnormal heart rhythms. °· A left ventricular assist device to assist the pumping ability of the heart. °HOME CARE INSTRUCTIONS  °· Take your medicine as directed by your caregiver. Medicines are important in reducing the workload of your heart, slowing the progression of heart failure, and improving your symptoms. °· Do not stop taking your medicine unless directed by your caregiver. °· Do not skip any dose of medicine. °· Refill your prescriptions before you run out of medicine. Your medicines are needed every day. °· Take over-the-counter  medicine only as directed by your caregiver or pharmacist. °· Engage in moderate physical activity if directed by your caregiver. Moderate physical activity can benefit some people. The elderly and people with severe heart failure should consult with a caregiver for physical activity recommendations. °· Eat heart healthy foods. Food choices should be free of trans fat and low in saturated fat, cholesterol, and salt (sodium). Healthy choices include fresh or frozen fruits and vegetables, fish, lean meats, legumes, fat-free or low-fat dairy products, and whole grain or high fiber foods. Talk to a dietitian to learn more about heart healthy foods. °· Limit sodium if directed by your caregiver. Sodium restriction may reduce symptoms of heart failure in some people. Talk to a dietitian to learn more about heart healthy seasonings. °· Use healthy cooking methods. Healthy cooking methods include roasting, grilling, broiling, baking, poaching, steaming, or stir-frying. Talk to a dietitian to learn more about healthy cooking methods. °· Limit fluids if directed by your caregiver. Fluid restriction may reduce symptoms of heart failure in some people. °· Weigh yourself every day. Daily weights are important in the early recognition of excess fluid. You should weigh yourself every morning after you urinate and before you eat breakfast. Wear the same amount of clothing each time you weigh yourself. Record your daily weight. Provide your caregiver with your weight record. °· Monitor and record your blood pressure if directed by your caregiver. °· Check your pulse if directed by your caregiver. °· Lose weight if directed   by your caregiver. Weight loss may reduce symptoms of heart failure in some people. °· Stop smoking or chewing tobacco. Nicotine makes your heart work harder by causing your blood vessels to constrict. Do not use nicotine gum or patches before talking to your caregiver. °· Schedule and attend follow-up visits as  directed by your caregiver. It is important to keep all your appointments. °· Limit alcohol intake to no more than 1 drink per day for nonpregnant women and 2 drinks per day for men. Drinking more than that is harmful to your heart. Tell your caregiver if you drink alcohol several times a week. Talk with your caregiver about whether alcohol is safe for you. If your heart has already been damaged by alcohol or you have severe heart failure, drinking alcohol should be stopped completely. °· Stop illicit drug use. °· Stay up-to-date with immunizations. It is especially important to prevent respiratory infections through current pneumococcal and influenza immunizations. °· Manage other health conditions such as hypertension, diabetes, thyroid disease, or abnormal heart rhythms as directed by your caregiver. °· Learn to manage stress. °· Plan rest periods when fatigued. °· Learn strategies to manage high temperatures. If the weather is extremely hot: °· Avoid vigorous physical activity. °· Use air conditioning or fans or seek a cooler location. °· Avoid caffeine and alcohol. °· Wear loose-fitting, lightweight, and light-colored clothing. °· Learn strategies to manage cold temperatures. If the weather is extremely cold: °· Avoid vigorous physical activity. °· Layer clothes. °· Wear mittens or gloves, a hat, and a scarf when going outside. °· Avoid alcohol. °· Obtain ongoing education and support as needed. °· Participate or seek rehabilitation as needed to maintain or improve independence and quality of life. °SEEK MEDICAL CARE IF:  °· Your weight increases by 03 lb/1.4 kg in 1 day or 05 lb/2.3 kg in a week. °· You have increasing shortness of breath that is unusual for you. °· You are unable to participate in your usual physical activities. °· You tire easily. °· You cough more than normal, especially with physical activity. °· You have any or more swelling in areas such as your hands, feet, ankles, or abdomen. °· You  are unable to sleep because it is hard to breathe. °· You feel like your heart is beating fast (palpitations). °· You become dizzy or lightheaded upon standing up. °SEEK IMMEDIATE MEDICAL CARE IF:  °· You have difficulty breathing. °· There is a change in mental status such as decreased alertness or difficulty with concentration. °· You have a pain or discomfort in your chest. °· You have an episode of fainting (syncope). °MAKE SURE YOU:  °· Understand these instructions. °· Will watch your condition. °· Will get help right away if you are not doing well or get worse. °Document Released: 03/04/2005 Document Revised: 06/29/2012 Document Reviewed: 03/26/2012 °[employer]® Patient Information ©2014 [employer]. ° °

## 2013-06-16 NOTE — Telephone Encounter (Signed)
Pt was seen and this was discussd

## 2013-07-13 ENCOUNTER — Telehealth: Payer: Self-pay | Admitting: Nurse Practitioner

## 2013-07-13 MED ORDER — ALBUTEROL SULFATE HFA 108 (90 BASE) MCG/ACT IN AERS: 2.0000 | INHALATION_SPRAY | Freq: Four times a day (QID) | RESPIRATORY_TRACT | Status: DC | PRN

## 2013-07-13 NOTE — Telephone Encounter (Signed)
Albuterol rx sent to pharmacy- they will only let you have 3 at a  Time- if using daily then not under control- should only use a  Rescue inhaler

## 2013-07-14 NOTE — Telephone Encounter (Signed)
Explained to patient. She isn't using daily but wanted to get the most for her money.

## 2013-07-29 ENCOUNTER — Other Ambulatory Visit: Payer: Self-pay | Admitting: Nurse Practitioner

## 2013-07-29 ENCOUNTER — Emergency Department (HOSPITAL_COMMUNITY): Payer: Medicare HMO

## 2013-07-29 ENCOUNTER — Encounter (HOSPITAL_COMMUNITY): Payer: Self-pay | Admitting: Emergency Medicine

## 2013-07-29 ENCOUNTER — Other Ambulatory Visit: Payer: Self-pay

## 2013-07-29 ENCOUNTER — Inpatient Hospital Stay (HOSPITAL_COMMUNITY)
Admission: EM | Admit: 2013-07-29 | Discharge: 2013-08-05 | DRG: 193 | Disposition: A | Discharge status: Home Health | Payer: Medicare HMO | Attending: Internal Medicine | Admitting: Internal Medicine

## 2013-07-29 ENCOUNTER — Ambulatory Visit (INDEPENDENT_AMBULATORY_CARE_PROVIDER_SITE_OTHER): Payer: Medicare HMO | Admitting: Nurse Practitioner

## 2013-07-29 ENCOUNTER — Encounter: Payer: Self-pay | Admitting: Nurse Practitioner

## 2013-07-29 VITALS — BP 97/68 | HR 111 | Temp 98.3°F

## 2013-07-29 DIAGNOSIS — M8580 Other specified disorders of bone density and structure, unspecified site: Secondary | ICD-10-CM

## 2013-07-29 DIAGNOSIS — G4700 Insomnia, unspecified: Secondary | ICD-10-CM

## 2013-07-29 DIAGNOSIS — F1020 Alcohol dependence, uncomplicated: Secondary | ICD-10-CM | POA: Diagnosis present

## 2013-07-29 DIAGNOSIS — W19XXXA Unspecified fall, initial encounter: Secondary | ICD-10-CM

## 2013-07-29 DIAGNOSIS — F29 Unspecified psychosis not due to a substance or known physiological condition: Secondary | ICD-10-CM

## 2013-07-29 DIAGNOSIS — R930 Abnormal findings on diagnostic imaging of skull and head, not elsewhere classified: Secondary | ICD-10-CM

## 2013-07-29 DIAGNOSIS — Y92009 Unspecified place in unspecified non-institutional (private) residence as the place of occurrence of the external cause: Secondary | ICD-10-CM

## 2013-07-29 DIAGNOSIS — Z820 Family history of epilepsy and other diseases of the nervous system: Secondary | ICD-10-CM

## 2013-07-29 DIAGNOSIS — Z87891 Personal history of nicotine dependence: Secondary | ICD-10-CM

## 2013-07-29 DIAGNOSIS — J4489 Other specified chronic obstructive pulmonary disease: Secondary | ICD-10-CM

## 2013-07-29 DIAGNOSIS — F10939 Alcohol use, unspecified with withdrawal, unspecified: Secondary | ICD-10-CM | POA: Diagnosis present

## 2013-07-29 DIAGNOSIS — G9340 Encephalopathy, unspecified: Secondary | ICD-10-CM

## 2013-07-29 DIAGNOSIS — J9602 Acute respiratory failure with hypercapnia: Secondary | ICD-10-CM

## 2013-07-29 DIAGNOSIS — E872 Acidosis, unspecified: Secondary | ICD-10-CM | POA: Diagnosis present

## 2013-07-29 DIAGNOSIS — R410 Disorientation, unspecified: Secondary | ICD-10-CM

## 2013-07-29 DIAGNOSIS — D539 Nutritional anemia, unspecified: Secondary | ICD-10-CM | POA: Diagnosis present

## 2013-07-29 DIAGNOSIS — I10 Essential (primary) hypertension: Secondary | ICD-10-CM

## 2013-07-29 DIAGNOSIS — E876 Hypokalemia: Secondary | ICD-10-CM

## 2013-07-29 DIAGNOSIS — J9620 Acute and chronic respiratory failure, unspecified whether with hypoxia or hypercapnia: Secondary | ICD-10-CM | POA: Diagnosis present

## 2013-07-29 DIAGNOSIS — G9341 Metabolic encephalopathy: Secondary | ICD-10-CM | POA: Diagnosis present

## 2013-07-29 DIAGNOSIS — G8929 Other chronic pain: Secondary | ICD-10-CM | POA: Diagnosis present

## 2013-07-29 DIAGNOSIS — R627 Adult failure to thrive: Secondary | ICD-10-CM

## 2013-07-29 DIAGNOSIS — J441 Chronic obstructive pulmonary disease with (acute) exacerbation: Secondary | ICD-10-CM

## 2013-07-29 DIAGNOSIS — J8410 Pulmonary fibrosis, unspecified: Secondary | ICD-10-CM | POA: Diagnosis present

## 2013-07-29 DIAGNOSIS — Z8249 Family history of ischemic heart disease and other diseases of the circulatory system: Secondary | ICD-10-CM

## 2013-07-29 DIAGNOSIS — F10239 Alcohol dependence with withdrawal, unspecified: Secondary | ICD-10-CM | POA: Diagnosis present

## 2013-07-29 DIAGNOSIS — J189 Pneumonia, unspecified organism: Principal | ICD-10-CM

## 2013-07-29 DIAGNOSIS — N059 Unspecified nephritic syndrome with unspecified morphologic changes: Secondary | ICD-10-CM

## 2013-07-29 DIAGNOSIS — Z79899 Other long term (current) drug therapy: Secondary | ICD-10-CM

## 2013-07-29 DIAGNOSIS — R0602 Shortness of breath: Secondary | ICD-10-CM

## 2013-07-29 DIAGNOSIS — F40298 Other specified phobia: Secondary | ICD-10-CM | POA: Diagnosis present

## 2013-07-29 DIAGNOSIS — Z9981 Dependence on supplemental oxygen: Secondary | ICD-10-CM

## 2013-07-29 DIAGNOSIS — M069 Rheumatoid arthritis, unspecified: Secondary | ICD-10-CM

## 2013-07-29 DIAGNOSIS — A3790 Whooping cough, unspecified species without pneumonia: Secondary | ICD-10-CM

## 2013-07-29 DIAGNOSIS — Z8679 Personal history of other diseases of the circulatory system: Secondary | ICD-10-CM

## 2013-07-29 DIAGNOSIS — E785 Hyperlipidemia, unspecified: Secondary | ICD-10-CM

## 2013-07-29 DIAGNOSIS — R000 Tachycardia, unspecified: Secondary | ICD-10-CM

## 2013-07-29 DIAGNOSIS — Z7982 Long term (current) use of aspirin: Secondary | ICD-10-CM

## 2013-07-29 DIAGNOSIS — Z96649 Presence of unspecified artificial hip joint: Secondary | ICD-10-CM

## 2013-07-29 DIAGNOSIS — J449 Chronic obstructive pulmonary disease, unspecified: Secondary | ICD-10-CM | POA: Diagnosis present

## 2013-07-29 DIAGNOSIS — F411 Generalized anxiety disorder: Secondary | ICD-10-CM | POA: Diagnosis present

## 2013-07-29 DIAGNOSIS — T380X5A Adverse effect of glucocorticoids and synthetic analogues, initial encounter: Secondary | ICD-10-CM | POA: Diagnosis present

## 2013-07-29 DIAGNOSIS — L408 Other psoriasis: Secondary | ICD-10-CM | POA: Diagnosis present

## 2013-07-29 DIAGNOSIS — R7309 Other abnormal glucose: Secondary | ICD-10-CM | POA: Diagnosis present

## 2013-07-29 DIAGNOSIS — I509 Heart failure, unspecified: Secondary | ICD-10-CM

## 2013-07-29 DIAGNOSIS — R7981 Abnormal blood-gas level: Secondary | ICD-10-CM

## 2013-07-29 LAB — COMPREHENSIVE METABOLIC PANEL
ALT: 16 U/L (ref 0–35)
AST: 28 U/L (ref 0–37)
Albumin: 3.2 g/dL — ABNORMAL LOW (ref 3.5–5.2)
Alkaline Phosphatase: 96 U/L (ref 39–117)
BUN: 19 mg/dL (ref 6–23)
CALCIUM: 10.8 mg/dL — AB (ref 8.4–10.5)
CO2: 44 mEq/L (ref 19–32)
CREATININE: 0.79 mg/dL (ref 0.50–1.10)
Chloride: 84 mEq/L — ABNORMAL LOW (ref 96–112)
GFR calc non Af Amer: 83 mL/min — ABNORMAL LOW (ref >90)
GLUCOSE: 129 mg/dL — AB (ref 70–99)
Potassium: 3.5 mEq/L — ABNORMAL LOW (ref 3.7–5.3)
Sodium: 137 mEq/L (ref 137–147)
TOTAL PROTEIN: 7.9 g/dL (ref 6.0–8.3)
Total Bilirubin: 0.6 mg/dL (ref 0.3–1.2)

## 2013-07-29 LAB — CBC WITH DIFFERENTIAL/PLATELET
BASOS ABS: 0 10*3/uL (ref 0.0–0.1)
Basophils Relative: 0 % (ref 0–1)
EOS ABS: 0 10*3/uL (ref 0.0–0.7)
EOS PCT: 0 % (ref 0–5)
HCT: 32.5 % — ABNORMAL LOW (ref 36.0–46.0)
Hemoglobin: 10.3 g/dL — ABNORMAL LOW (ref 12.0–15.0)
Lymphocytes Relative: 18 % (ref 12–46)
Lymphs Abs: 0.6 10*3/uL — ABNORMAL LOW (ref 0.7–4.0)
MCH: 32.3 pg (ref 26.0–34.0)
MCHC: 31.7 g/dL (ref 30.0–36.0)
MCV: 101.9 fL — AB (ref 78.0–100.0)
Monocytes Absolute: 0.7 10*3/uL (ref 0.1–1.0)
Monocytes Relative: 21 % — ABNORMAL HIGH (ref 3–12)
Neutro Abs: 1.9 10*3/uL (ref 1.7–7.7)
Neutrophils Relative %: 61 % (ref 43–77)
Platelets: 168 10*3/uL (ref 150–400)
RBC: 3.19 MIL/uL — ABNORMAL LOW (ref 3.87–5.11)
RDW: 12.7 % (ref 11.5–15.5)
WBC: 3.1 10*3/uL — ABNORMAL LOW (ref 4.0–10.5)

## 2013-07-29 LAB — URINALYSIS, ROUTINE W REFLEX MICROSCOPIC
Bilirubin Urine: NEGATIVE
Glucose, UA: NEGATIVE mg/dL
Ketones, ur: NEGATIVE mg/dL
Leukocytes, UA: NEGATIVE
NITRITE: NEGATIVE
SPECIFIC GRAVITY, URINE: 1.02 (ref 1.005–1.030)
Urobilinogen, UA: 0.2 mg/dL (ref 0.0–1.0)
pH: 6 (ref 5.0–8.0)

## 2013-07-29 LAB — PRO B NATRIURETIC PEPTIDE: Pro B Natriuretic peptide (BNP): 3332 pg/mL — ABNORMAL HIGH (ref 0–125)

## 2013-07-29 LAB — URINE MICROSCOPIC-ADD ON

## 2013-07-29 LAB — LACTIC ACID, PLASMA: Lactic Acid, Venous: 2.3 mmol/L — ABNORMAL HIGH (ref 0.5–2.2)

## 2013-07-29 LAB — TROPONIN I

## 2013-07-29 MED ORDER — SODIUM CHLORIDE 0.9 % IV SOLN: 250.0000 mL | INTRAVENOUS | Status: DC | PRN

## 2013-07-29 MED ORDER — AZITHROMYCIN 500 MG IV SOLR: 500.0000 mg | Freq: Once | INTRAVENOUS | Status: DC

## 2013-07-29 MED ORDER — IPRATROPIUM-ALBUTEROL 0.5-2.5 (3) MG/3ML IN SOLN
3.0000 mL | RESPIRATORY_TRACT | Status: DC
  Administered 2013-07-29 – 2013-08-02 (×22): 3 mL via RESPIRATORY_TRACT
  Filled 2013-07-29 (×22): qty 3

## 2013-07-29 MED ORDER — ADULT MULTIVITAMIN W/MINERALS CH
1.0000 | ORAL_TABLET | Freq: Every day | ORAL | Status: DC
  Administered 2013-07-29 – 2013-08-04 (×5): 1 via ORAL
  Filled 2013-07-29 (×6): qty 1

## 2013-07-29 MED ORDER — AMITRIPTYLINE HCL 25 MG PO TABS
50.0000 mg | ORAL_TABLET | Freq: Every evening | ORAL | Status: DC | PRN
  Administered 2013-07-31 – 2013-08-02 (×4): 50 mg via ORAL
  Filled 2013-07-29 (×6): qty 2

## 2013-07-29 MED ORDER — POTASSIUM CHLORIDE CRYS ER 20 MEQ PO TBCR
40.0000 meq | EXTENDED_RELEASE_TABLET | Freq: Once | ORAL | Status: AC
  Administered 2013-07-29: 40 meq via ORAL
  Filled 2013-07-29: qty 2

## 2013-07-29 MED ORDER — SODIUM CHLORIDE 0.9 % IJ SOLN
3.0000 mL | Freq: Two times a day (BID) | INTRAMUSCULAR | Status: DC
  Administered 2013-07-29 – 2013-08-03 (×7): 3 mL via INTRAVENOUS

## 2013-07-29 MED ORDER — HEPARIN SODIUM (PORCINE) 5000 UNIT/ML IJ SOLN
5000.0000 [IU] | Freq: Three times a day (TID) | INTRAMUSCULAR | Status: DC
  Administered 2013-07-29 – 2013-08-05 (×20): 5000 [IU] via SUBCUTANEOUS
  Filled 2013-07-29 (×20): qty 1

## 2013-07-29 MED ORDER — ALPRAZOLAM 0.5 MG PO TABS
0.5000 mg | ORAL_TABLET | Freq: Two times a day (BID) | ORAL | Status: DC
  Administered 2013-07-29 – 2013-07-30 (×2): 0.5 mg via ORAL
  Filled 2013-07-29 (×2): qty 1

## 2013-07-29 MED ORDER — PREDNISONE 10 MG PO TABS
60.0000 mg | ORAL_TABLET | Freq: Once | ORAL | Status: AC
  Administered 2013-07-29: 60 mg via ORAL
  Filled 2013-07-29 (×2): qty 1

## 2013-07-29 MED ORDER — GUAIFENESIN ER 600 MG PO TB12
600.0000 mg | ORAL_TABLET | Freq: Every day | ORAL | Status: DC | PRN
  Filled 2013-07-29: qty 1

## 2013-07-29 MED ORDER — VITAMIN B-1 100 MG PO TABS
100.0000 mg | ORAL_TABLET | Freq: Every day | ORAL | Status: DC
  Administered 2013-07-29 – 2013-08-05 (×6): 100 mg via ORAL
  Filled 2013-07-29 (×6): qty 1

## 2013-07-29 MED ORDER — METHYLPREDNISOLONE SODIUM SUCC 125 MG IJ SOLR
60.0000 mg | Freq: Two times a day (BID) | INTRAMUSCULAR | Status: DC
  Administered 2013-07-29 – 2013-07-30 (×3): 60 mg via INTRAVENOUS
  Filled 2013-07-29 (×3): qty 2

## 2013-07-29 MED ORDER — ACETAMINOPHEN 650 MG RE SUPP: 650.0000 mg | Freq: Four times a day (QID) | RECTAL | Status: DC | PRN

## 2013-07-29 MED ORDER — ASPIRIN 325 MG PO TABS
325.0000 mg | ORAL_TABLET | Freq: Every day | ORAL | Status: DC
  Administered 2013-07-29 – 2013-08-05 (×6): 325 mg via ORAL
  Filled 2013-07-29 (×6): qty 1

## 2013-07-29 MED ORDER — DEXTROSE 5 % IV SOLN
500.0000 mg | Freq: Once | INTRAVENOUS | Status: AC
  Administered 2013-07-29: 500 mg via INTRAVENOUS

## 2013-07-29 MED ORDER — ALUM & MAG HYDROXIDE-SIMETH 200-200-20 MG/5ML PO SUSP: 30.0000 mL | Freq: Four times a day (QID) | ORAL | Status: DC | PRN

## 2013-07-29 MED ORDER — VITAMIN D 1000 UNITS PO TABS
1000.0000 [IU] | ORAL_TABLET | Freq: Every day | ORAL | Status: DC
  Administered 2013-07-30 – 2013-08-05 (×5): 1000 [IU] via ORAL
  Filled 2013-07-29 (×7): qty 1

## 2013-07-29 MED ORDER — ONDANSETRON HCL 4 MG/2ML IJ SOLN: 4.0000 mg | Freq: Four times a day (QID) | INTRAMUSCULAR | Status: DC | PRN

## 2013-07-29 MED ORDER — FUROSEMIDE 20 MG PO TABS
20.0000 mg | ORAL_TABLET | Freq: Every day | ORAL | Status: DC
  Administered 2013-07-30: 20 mg via ORAL
  Filled 2013-07-29: qty 1

## 2013-07-29 MED ORDER — TIOTROPIUM BROMIDE MONOHYDRATE 18 MCG IN CAPS: 18.0000 ug | ORAL_CAPSULE | Freq: Every morning | RESPIRATORY_TRACT | Status: DC

## 2013-07-29 MED ORDER — AZITHROMYCIN 500 MG IV SOLR
250.0000 mg | INTRAVENOUS | Status: DC
  Administered 2013-07-30 – 2013-08-02 (×4): 250 mg via INTRAVENOUS
  Filled 2013-07-29 (×4): qty 250

## 2013-07-29 MED ORDER — BISACODYL 10 MG RE SUPP: 10.0000 mg | Freq: Every day | RECTAL | Status: DC | PRN

## 2013-07-29 MED ORDER — LORATADINE 10 MG PO TABS
10.0000 mg | ORAL_TABLET | Freq: Every day | ORAL | Status: DC
  Administered 2013-07-29 – 2013-08-05 (×6): 10 mg via ORAL
  Filled 2013-07-29 (×6): qty 1

## 2013-07-29 MED ORDER — AMITRIPTYLINE HCL 50 MG PO TABS
ORAL_TABLET | ORAL | Status: AC
  Filled 2013-07-29: qty 1

## 2013-07-29 MED ORDER — ALBUTEROL SULFATE (2.5 MG/3ML) 0.083% IN NEBU
5.0000 mg | INHALATION_SOLUTION | Freq: Once | RESPIRATORY_TRACT | Status: AC
  Administered 2013-07-29: 5 mg via RESPIRATORY_TRACT
  Filled 2013-07-29: qty 6

## 2013-07-29 MED ORDER — DEXTROSE 5 % IV SOLN
1.0000 g | Freq: Once | INTRAVENOUS | Status: AC
  Administered 2013-07-29: 1 g via INTRAVENOUS
  Filled 2013-07-29: qty 10

## 2013-07-29 MED ORDER — DEXTROSE 5 % IV SOLN
INTRAVENOUS | Status: AC
  Filled 2013-07-29: qty 500

## 2013-07-29 MED ORDER — MOMETASONE FURO-FORMOTEROL FUM 100-5 MCG/ACT IN AERO
INHALATION_SPRAY | RESPIRATORY_TRACT | Status: AC
  Filled 2013-07-29: qty 8.8

## 2013-07-29 MED ORDER — FOLIC ACID 1 MG PO TABS
1.0000 mg | ORAL_TABLET | Freq: Every day | ORAL | Status: DC
  Administered 2013-07-29 – 2013-08-05 (×6): 1 mg via ORAL
  Filled 2013-07-29 (×6): qty 1

## 2013-07-29 MED ORDER — MOMETASONE FURO-FORMOTEROL FUM 100-5 MCG/ACT IN AERO
2.0000 | INHALATION_SPRAY | Freq: Two times a day (BID) | RESPIRATORY_TRACT | Status: DC
  Administered 2013-07-30: 2 via RESPIRATORY_TRACT
  Filled 2013-07-29: qty 8.8

## 2013-07-29 MED ORDER — SODIUM CHLORIDE 0.9 % IJ SOLN
3.0000 mL | INTRAMUSCULAR | Status: DC | PRN
  Administered 2013-08-02: 10 mL via INTRAVENOUS

## 2013-07-29 MED ORDER — CITALOPRAM HYDROBROMIDE 20 MG PO TABS
20.0000 mg | ORAL_TABLET | Freq: Every day | ORAL | Status: DC
  Administered 2013-07-30: 20 mg via ORAL
  Filled 2013-07-29 (×3): qty 1

## 2013-07-29 MED ORDER — ADALIMUMAB 40 MG/0.8ML ~~LOC~~ KIT: 40.0000 mg | PACK | SUBCUTANEOUS | Status: DC

## 2013-07-29 MED ORDER — ACETAMINOPHEN 325 MG PO TABS: 650.0000 mg | ORAL_TABLET | Freq: Four times a day (QID) | ORAL | Status: DC | PRN

## 2013-07-29 MED ORDER — METOPROLOL TARTRATE 50 MG PO TABS
50.0000 mg | ORAL_TABLET | Freq: Two times a day (BID) | ORAL | Status: DC
  Administered 2013-07-29 – 2013-07-30 (×3): 50 mg via ORAL
  Filled 2013-07-29 (×3): qty 1

## 2013-07-29 MED ORDER — ONDANSETRON HCL 4 MG PO TABS: 4.0000 mg | ORAL_TABLET | Freq: Four times a day (QID) | ORAL | Status: DC | PRN

## 2013-07-29 MED ORDER — DEXTROSE 5 % IV SOLN
1.0000 g | INTRAVENOUS | Status: DC
  Administered 2013-07-30 – 2013-08-04 (×6): 1 g via INTRAVENOUS
  Filled 2013-07-29 (×7): qty 10

## 2013-07-29 MED ORDER — SENNA 8.6 MG PO TABS
1.0000 | ORAL_TABLET | Freq: Two times a day (BID) | ORAL | Status: DC
  Administered 2013-07-29 – 2013-08-04 (×11): 8.6 mg via ORAL
  Filled 2013-07-29 (×15): qty 1

## 2013-07-29 MED ORDER — FUROSEMIDE 10 MG/ML IJ SOLN
40.0000 mg | Freq: Once | INTRAMUSCULAR | Status: AC
  Administered 2013-07-29: 40 mg via INTRAVENOUS
  Filled 2013-07-29: qty 4

## 2013-07-29 MED ORDER — HYDROCODONE-ACETAMINOPHEN 5-325 MG PO TABS
1.0000 | ORAL_TABLET | ORAL | Status: DC | PRN
  Administered 2013-07-29: 2 via ORAL
  Filled 2013-07-29: qty 2

## 2013-07-29 MED ORDER — FUROSEMIDE 20 MG PO TABS: 20.0000 mg | ORAL_TABLET | Freq: Every day | ORAL | Status: DC

## 2013-07-29 MED ORDER — MAGNESIUM CITRATE PO SOLN: 1.0000 | Freq: Once | ORAL | Status: AC | PRN

## 2013-07-29 NOTE — ED Notes (Signed)
Daughter at the bedside

## 2013-07-29 NOTE — H&P (Signed)
[facility] History and Physical  LAUREN MODISETTE ZDG:[id number] DOB: [birth year] DOA: 07/29/2013  Referring physician: Carmin Muskrat, MD PCP: Redge Gainer, MD   Chief Complaint: short of breath  HPI: [name] is a 68 y.o. female presents to the ED with increased shortness of breath. Patient was seen today at herr PCP office and was noted by her daughter to be very shaky and also was more confused. She states that she has a history of COPD and has been on chronic oxygen for quite some time. She has recently had her oxygen decreased due to concern over hypercapnea. Patient states that she has had a cough and sputum which is very thick. She has no hemoptysis. She has had some heaviness in her chest with her breathing difficulty but has no frank  Chest pain. She states that she has had wheeze also. The main concern again with the daughter was the incoordination and memory issues as well as tremors.   Review of Systems:  Constitutional:  No weight loss, night sweats, ++Fevers, chills, ++fatigue.  HEENT:  No headaches, no sneezing, itching, ear ache, nasal congestion, post nasal drip,  Cardio-vascular:  No chest pain, Orthopnea, PND, swelling in lower extremities or syncope  GI:  No heartburn, indigestion, abdominal pain, nausea, vomiting, diarrhea,   Resp:  ++shortness of breath with exertion and at rest ++productive cough No coughing up of blood ++wheeze  Skin:  no rash or lesions.  GU:  no dysuria, change in color of urine, no urgency or frequency. No flank pain.  Musculoskeletal:  No joint pain or swelling. No decreased range of motion. No back pain.  Psych:  No change in mood or affect. ++ anxiety. ++memory loss.   Past Medical History  Diagnosis Date  . HTN (hypertension)   . COPD (chronic obstructive pulmonary disease)   . Anxiety disorder   . Hyperglycemia   . Psoriasis   . Avascular necrosis of hip     left  . ETOH abuse    Past Surgical History    Procedure Laterality Date  . Total hip arthroplasty  05-15-08    r hip  . Tonsillectomy     Social History:  reports that she quit smoking about 4 years ago. She does not have any smokeless tobacco history on file. She reports that she drinks alcohol. She reports that she does not use illicit drugs.  Allergies  Allergen Reactions  . Nickel   . Tramadol Hcl     REACTION: nausea and vomiting    Family History  Problem Relation Age of Onset  . Heart disease Father   . Hyperlipidemia Father   . Cancer Paternal Grandfather   . Alzheimer's disease Mother      Prior to Admission medications   Medication Sig Start Date End Date Taking? Authorizing Provider  adalimumab (HUMIRA PEN) 40 MG/0.8ML injection Inject 40 mg into the skin every 14 (fourteen) days.     Historical Provider, MD  albuterol (PROAIR HFA) 108 (90 BASE) MCG/ACT inhaler Inhale 2 puffs into the lungs every 6 (six) hours as needed for wheezing or shortness of breath. 07/13/13   Mary-Margaret Hassell Done, FNP  albuterol (PROVENTIL) (2.5 MG/3ML) 0.083% nebulizer solution Take 3 mLs (2.5 mg total) by nebulization every 6 (six) hours as needed for wheezing or shortness of breath. 04/22/13   Mary-Margaret Hassell Done, FNP  ALPRAZolam Duanne Moron) 0.5 MG tablet Take 1 tablet (0.5 mg total) by mouth 2 (two) times daily. 05/25/13   Mary-Margaret Hassell Done,  FNP  amitriptyline (ELAVIL) 50 MG tablet Take 50 mg by mouth at bedtime as needed for sleep.    Historical Provider, MD  aspirin 325 MG tablet Take 325 mg by mouth daily.      Historical Provider, MD  cetirizine (ALL DAY ALLERGY) 10 MG tablet Take 10 mg by mouth daily.    Historical Provider, MD  cholecalciferol (VITAMIN D) 1000 UNITS tablet Take 1,000 Units by mouth daily.    Historical Provider, MD  citalopram (CELEXA) 20 MG tablet Take 1 tablet (20 mg total) by mouth daily. 06/10/13   Mary-Margaret Hassell Done, FNP  EQL CALCIUM CITRATE/VITAMIN D PO Take by mouth. Vitamin D 500IU with Calcium Citrate 630mg      Historical Provider, MD  furosemide (LASIX) 20 MG tablet Take 1 tablet (20 mg total) by mouth daily. 06/10/13   Mary-Margaret Hassell Done, FNP  guaifenesin (MUCUS RELIEF) 400 MG TABS tablet Take 400 mg by mouth daily as needed (for ocngestion).    Historical Provider, MD  HYDROcodone-acetaminophen (NORCO) 10-325 MG per tablet Take 1 tablet by mouth every 8 (eight) hours as needed. 05/24/13   Mary-Margaret Hassell Done, FNP  metoprolol (LOPRESSOR) 50 MG tablet Take 1 tablet (50 mg total) by mouth 2 (two) times daily. 04/22/13   Mary-Margaret Hassell Done, FNP  tiotropium (SPIRIVA) 18 MCG inhalation capsule Place 18 mcg into inhaler and inhale every morning.     Historical Provider, MD   Physical Exam: Filed Vitals:   07/29/13 1634  BP: 141/78  Pulse: 107  Temp: 98.7 F (37.1 C)  Resp: 22    BP 141/78  Pulse 107  Temp(Src) 98.7 F (37.1 C) (Oral)  Resp 22  Ht 4\' 5"  (1.346 m)  Wt 53.978 kg (119 lb)  BMI 29.79 kg/m2  SpO2 95%  General:  Appears calm and comfortable Eyes: PERRL, normal lids, irises & conjunctiva ENT: grossly normal hearing, lips & tongue Neck: no LAD, masses or thyromegaly Cardiovascular: RRR, no m/r/g. No LE edema. Telemetry: SR, no arrhythmias  Respiratory: ++wheeze bilaterally few scattered coarse crackles Abdomen: soft, ntnd Skin: no rash or induration seen on limited exam Musculoskeletal: grossly normal tone BUE/BLE Psychiatric: grossly normal mood and affect, speech fluent and appropriate Neurologic: grossly non-focal.          Labs on Admission:  Basic Metabolic Panel:  Recent Labs Lab 07/29/13 1734  NA 137  K 3.5*  CL 84*  CO2 44*  GLUCOSE 129*  BUN 19  CREATININE 0.79  CALCIUM 10.8*   Liver Function Tests:  Recent Labs Lab 07/29/13 1734  AST 28  ALT 16  ALKPHOS 96  BILITOT 0.6  PROT 7.9  ALBUMIN 3.2*   No results found for this basename: LIPASE, AMYLASE,  in the last 168 hours No results found for this basename: AMMONIA,  in the last 168  hours CBC:  Recent Labs Lab 07/29/13 1734  WBC 3.1*  NEUTROABS 1.9  HGB 10.3*  HCT 32.5*  MCV 101.9*  PLT 168   Cardiac Enzymes:  Recent Labs Lab 07/29/13 1734  TROPONINI <0.30    BNP (last 3 results)  Recent Labs  07/29/13 1734  PROBNP 3332.0*   CBG: No results found for this basename: GLUCAP,  in the last 168 hours  Radiological Exams on Admission: Dg Chest 2 View  07/29/2013   CLINICAL DATA:  Shortness of breath.  EXAM: CHEST  2 VIEW  COMPARISON:  05/17/2013  FINDINGS: Lungs are adequately inflated and demonstrate slight worsening right base opacification as cannot exclude  acute on chronic process due to infection. Stable coarse interstitial changes over the lung apices. Cardiomediastinal silhouette and remainder of the exam is unchanged.  IMPRESSION: Slight worsening opacification in the right base as cannot exclude acute infection on a background of chronic changes. Stable chronic apical coarse interstitial changes.   Electronically Signed   By: Marin Olp M.D.   On: 07/29/2013 18:18       Assessment/Plan Principal Problem:   Community acquired pneumonia Active Problems:   COPD   Hyperlipidemia   HTN (hypertension)   CHF (congestive heart failure)   COPD exacerbation   1. Pneumonia -will admit for IV antibiotics to cover CAP -patient given rocephin azithromycin in ED no cultures drawn -low WBC likely related to acute infection -consider CT chest as she appears to have worsening of her lung disease from prior films -may have some element on ILD underlying  2. COPD exacerbation -will start on IV steroids -duonebs as needed -continue with spiriva -will also start on dulera  3. Hyperlipidemia -stable presently  4. HTN -monitor pressures -currently stable  5. CHF -no evidence of failure at this time  6. Elevated Glucose -will check finger sticks -place on modified carb diet  7. Hypokalemia -will repeat labs in am -Replete as  needed  8. Anemia -check iron studies    Code Status: Full code (must indicate code status--if unknown or must be presumed, indicate so) Family Communication: daughter in room (indicate person spoken with, if applicable, with phone number if by telephone) Disposition Plan: Home (indicate anticipated LOS)  Time spent: 38min  Saadat A Khan [facility] Pager [phone]

## 2013-07-29 NOTE — ED Notes (Signed)
Pt is talking Sat at 100% on 4L, reduced to 3L

## 2013-07-29 NOTE — ED Notes (Signed)
CRITICAL VALUE ALERT  Critical value received:  Venous CO2 44  Date of notification:  07/29/13  Time of notification:  1822  Critical value read back:yes  Nurse who received alert:  Doreene Burke RN  MD notified (1st page):    Time of first page:    MD notified (2nd page):  Time of second page:  Responding MD:  Dr. Carmin Muskrat  Time MD responded:  [phone]

## 2013-07-29 NOTE — Progress Notes (Signed)
   Subjective:    Patient ID: [name], female    DOB: 02-15-1946, 68 y.o.   MRN: [id number]  HPI Patient brought in by daughter today with c/o SOB and confusion- is on O2 at home continuous at 2l at home- O2sat were 95% today. When she came in she was on conservative and that dropped her sat at 77%. Her daughter said that seh fell this morning getting out of bed- did ot hit her head but does not remember it happening.  * the confusion and repetitive questions have gotten worse over the last 3 weeks. Only change in meds is celexa 20 mg was added recently.  Review of Systems  Constitutional: Negative.   HENT: Negative.   Respiratory: Positive for cough and shortness of breath.   Cardiovascular: Negative.   Gastrointestinal: Negative.   Genitourinary: Negative.   Neurological: Positive for tremors and speech difficulty (slurred).  Hematological: Negative.   Psychiatric/Behavioral: Negative.        Confusion and repetitive questions.  All other systems reviewed and are negative.  .    Objective:   Physical Exam  Constitutional: She is oriented to person, place, and time. She appears well-developed and well-nourished.  Cardiovascular: Normal rate and normal heart sounds.   tachycardia  Pulmonary/Chest: Effort normal and breath sounds normal.  o2 sat staying in the 85-88% on O2 Deep cough- productive Very poor air exchange  Neurological: She is alert and oriented to person, place, and time.  Right hand trembling  Skin: Skin is warm.  Psychiatric: She has a normal mood and affect. Her behavior is normal. Judgment normal.  Patient confused   BP 97/68  Pulse 111  Temp(Src) 98.3 F (36.8 C) (Oral)  SpO2 85%        Assessment & Plan:   1. Confusion   2. SOB (shortness of breath)   3. Low O2 saturation   4. Tachycardia 5. Fall  To ER via EMS  [place], [place]

## 2013-07-29 NOTE — ED Provider Notes (Signed)
CSN: [id number]     Arrival date & time 07/29/13  1641 History  This chart was scribed for Carmin Muskrat, MD by Rolanda Lundborg, ED Scribe. This patient was seen in room APA06/APA06 and the patient's care was started at 4:42 PM.    Chief Complaint  Patient presents with  . Shortness of Breath  . Cough   The history is provided by the patient and a relative. No language interpreter was used.   HPI Comments: [name] is a 68 y.o. female who presents to the Emergency Department complaining of gradual-onset, gradually-worsening productive cough with green sputum that started one week ago with associated difficulty breathing. She is on 2L home oxygen all the time. Daughter states she has been declining 3 weeks, worse in the last week, in terms of her breathing and confusion. Daughter states she has been repeating questions, not remembering things, and stumbling. Daughter states she has also been having gradually-worsening hand tremors while holding things in the last month. She has chronic pain issues.   Past Medical History  Diagnosis Date  . HTN (hypertension)   . COPD (chronic obstructive pulmonary disease)   . Anxiety disorder   . Hyperglycemia   . Psoriasis   . Avascular necrosis of hip     left  . ETOH abuse    Past Surgical History  Procedure Laterality Date  . Total hip arthroplasty  05-15-08    r hip  . Tonsillectomy     Family History  Problem Relation Age of Onset  . Heart disease Father   . Hyperlipidemia Father   . Cancer Paternal Grandfather   . Alzheimer's disease Mother    History  Substance Use Topics  . Smoking status: Former Smoker -- 1.00 packs/day for 35 years    Quit date: 02/15/2009  . Smokeless tobacco: Not on file  . Alcohol Use: Yes     Comment: hx of abuse and withdrawal   OB History   Grav Para Term Preterm Abortions TAB SAB Ect Mult Living                 Review of Systems  Constitutional:       Per HPI, otherwise negative  HENT:       Per HPI, otherwise negative  Respiratory:       Per HPI, otherwise negative  Cardiovascular:       Per HPI, otherwise negative  Gastrointestinal: Negative for vomiting.  Endocrine:       Negative aside from HPI  Genitourinary:       Neg aside from HPI   Musculoskeletal:       Per HPI, otherwise negative  Skin: Negative.   Neurological: Negative for syncope.      Allergies  Nickel and Tramadol hcl  Home Medications   Prior to Admission medications   Medication Sig Start Date End Date Taking? Authorizing Provider  adalimumab (HUMIRA PEN) 40 MG/0.8ML injection Inject 40 mg into the skin every 14 (fourteen) days.     Historical Provider, MD  albuterol (PROAIR HFA) 108 (90 BASE) MCG/ACT inhaler Inhale 2 puffs into the lungs every 6 (six) hours as needed for wheezing or shortness of breath. 07/13/13   Mary-Margaret Hassell Done, FNP  albuterol (PROVENTIL) (2.5 MG/3ML) 0.083% nebulizer solution Take 3 mLs (2.5 mg total) by nebulization every 6 (six) hours as needed for wheezing or shortness of breath. 04/22/13   Mary-Margaret Hassell Done, FNP  ALPRAZolam Duanne Moron) 0.5 MG tablet Take 1 tablet (0.5 mg  total) by mouth 2 (two) times daily. 05/25/13   Mary-Margaret Hassell Done, FNP  amitriptyline (ELAVIL) 50 MG tablet Take 50 mg by mouth at bedtime as needed for sleep.    Historical Provider, MD  aspirin 325 MG tablet Take 325 mg by mouth daily.      Historical Provider, MD  cetirizine (ALL DAY ALLERGY) 10 MG tablet Take 10 mg by mouth daily.    Historical Provider, MD  cholecalciferol (VITAMIN D) 1000 UNITS tablet Take 1,000 Units by mouth daily.    Historical Provider, MD  citalopram (CELEXA) 20 MG tablet Take 1 tablet (20 mg total) by mouth daily. 06/10/13   Mary-Margaret Hassell Done, FNP  EQL CALCIUM CITRATE/VITAMIN D PO Take by mouth. Vitamin D 500IU with Calcium Citrate 630mg     Historical Provider, MD  furosemide (LASIX) 20 MG tablet Take 1 tablet (20 mg total) by mouth daily. 06/10/13   Mary-Margaret Hassell Done,  FNP  guaifenesin (MUCUS RELIEF) 400 MG TABS tablet Take 400 mg by mouth daily as needed (for ocngestion).    Historical Provider, MD  HYDROcodone-acetaminophen (NORCO) 10-325 MG per tablet Take 1 tablet by mouth every 8 (eight) hours as needed. 05/24/13   Mary-Margaret Hassell Done, FNP  metoprolol (LOPRESSOR) 50 MG tablet Take 1 tablet (50 mg total) by mouth 2 (two) times daily. 04/22/13   Mary-Margaret Hassell Done, FNP  tiotropium (SPIRIVA) 18 MCG inhalation capsule Place 18 mcg into inhaler and inhale every morning.     Historical Provider, MD   BP 141/78  Pulse 107  Temp(Src) 98.7 F (37.1 C) (Oral)  Resp 22  Ht 4\' 5"  (1.346 m)  Wt 119 lb (53.978 kg)  BMI 29.79 kg/m2  SpO2 98% Physical Exam  Nursing note and vitals reviewed. Constitutional: She is oriented to person, place, and time. She appears well-developed and well-nourished. No distress.  HENT:  Head: Normocephalic and atraumatic.  Eyes: Conjunctivae and EOM are normal.  Cardiovascular: Normal rate and regular rhythm.   Pulmonary/Chest: Effort normal. No stridor. No respiratory distress. She has wheezes (bilateral).  Abdominal: She exhibits no distension.  Musculoskeletal: She exhibits no edema.  Neurological: She is alert and oriented to person, place, and time. No cranial nerve deficit.  Skin: Skin is warm and dry.  Psychiatric: She has a normal mood and affect.    ED Course  Procedures (including critical care time) Medications  albuterol (PROVENTIL) (2.5 MG/3ML) 0.083% nebulizer solution 5 mg (5 mg Nebulization Given 07/29/13 1724)  predniSONE (DELTASONE) tablet 60 mg (60 mg Oral Given 07/29/13 1712)    DIAGNOSTIC STUDIES: Oxygen Saturation is 98% on , normal by my interpretation.    COORDINATION OF CARE: 5:00 PM- Discussed treatment plan with pt which includes breathing treatment, CXR, blood work, UA, and EKG. Pt agrees to plan.    Labs Review Labs Reviewed  CBC WITH DIFFERENTIAL - Abnormal; Notable for the following:     WBC 3.1 (*)    RBC 3.19 (*)    Hemoglobin 10.3 (*)    HCT 32.5 (*)    MCV 101.9 (*)    Lymphs Abs 0.6 (*)    Monocytes Relative 21 (*)    All other components within normal limits  COMPREHENSIVE METABOLIC PANEL - Abnormal; Notable for the following:    Potassium 3.5 (*)    Chloride 84 (*)    CO2 44 (*)    Glucose, Bld 129 (*)    Calcium 10.8 (*)    Albumin 3.2 (*)    GFR calc non Af  Amer 83 (*)    All other components within normal limits  PRO B NATRIURETIC PEPTIDE - Abnormal; Notable for the following:    Pro B Natriuretic peptide (BNP) 3332.0 (*)    All other components within normal limits  LACTIC ACID, PLASMA - Abnormal; Notable for the following:    Lactic Acid, Venous 2.3 (*)    All other components within normal limits  TROPONIN I  URINALYSIS, ROUTINE W REFLEX MICROSCOPIC    Imaging Review Dg Chest 2 View  07/29/2013   CLINICAL DATA:  Shortness of breath.  EXAM: CHEST  2 VIEW  COMPARISON:  05/17/2013  FINDINGS: Lungs are adequately inflated and demonstrate slight worsening right base opacification as cannot exclude acute on chronic process due to infection. Stable coarse interstitial changes over the lung apices. Cardiomediastinal silhouette and remainder of the exam is unchanged.  IMPRESSION: Slight worsening opacification in the right base as cannot exclude acute infection on a background of chronic changes. Stable chronic apical coarse interstitial changes.   Electronically Signed   By: Marin Olp M.D.   On: 07/29/2013 18:18   EKG has sinus tachycardia, with rate 104.  On repeat exam the patient is sitting upright, calm. She, her daughter and I discussed all findings.  The patient will be admitted. MDM   This patient presents with dyspnea, cough. Patient has multiple medical problems, and today's evaluation is consistent with multifactorial illness, including concern for COPD exacerbation, no cord money, and worsening heart failure. In addition the patient's  hypokalemia. Patient was treated with potassium, Lasix, Plavix, steroids in the emergency department, admitted for further evaluation and management.  I personally performed the services described in this documentation, which was scribed in my presence. The recorded information has been reviewed and is accurate.     Carmin Muskrat, MD 07/29/13 [phone]

## 2013-07-29 NOTE — ED Notes (Signed)
Pt states started cough last week,  Coughing up green sputum, pt on 2L home 02, Took breathing treatment yesterday, When to PCP today, low 02 sat, they call 911, pt was 89% on 2L, drops when talking, pt now on 4L, sat 98%

## 2013-07-30 DIAGNOSIS — J189 Pneumonia, unspecified organism: Principal | ICD-10-CM

## 2013-07-30 DIAGNOSIS — G9340 Encephalopathy, unspecified: Secondary | ICD-10-CM

## 2013-07-30 DIAGNOSIS — J441 Chronic obstructive pulmonary disease with (acute) exacerbation: Secondary | ICD-10-CM

## 2013-07-30 LAB — IRON AND TIBC
IRON: 20 ug/dL — AB (ref 42–135)
SATURATION RATIOS: 8 % — AB (ref 20–55)
TIBC: 237 ug/dL — ABNORMAL LOW (ref 250–470)
UIBC: 217 ug/dL (ref 125–400)

## 2013-07-30 LAB — GLUCOSE, CAPILLARY
GLUCOSE-CAPILLARY: 160 mg/dL — AB (ref 70–99)
GLUCOSE-CAPILLARY: 165 mg/dL — AB (ref 70–99)
Glucose-Capillary: 141 mg/dL — ABNORMAL HIGH (ref 70–99)
Glucose-Capillary: 134 mg/dL — ABNORMAL HIGH (ref 70–99)

## 2013-07-30 LAB — CBC
HEMATOCRIT: 29.8 % — AB (ref 36.0–46.0)
HEMOGLOBIN: 9.6 g/dL — AB (ref 12.0–15.0)
MCH: 33 pg (ref 26.0–34.0)
MCHC: 32.2 g/dL (ref 30.0–36.0)
MCV: 102.4 fL — ABNORMAL HIGH (ref 78.0–100.0)
Platelets: 170 10*3/uL (ref 150–400)
RBC: 2.91 MIL/uL — ABNORMAL LOW (ref 3.87–5.11)
RDW: 12.7 % (ref 11.5–15.5)
WBC: 3.3 10*3/uL — ABNORMAL LOW (ref 4.0–10.5)

## 2013-07-30 LAB — BLOOD GAS, ARTERIAL
Acid-Base Excess: 15.1 mmol/L — ABNORMAL HIGH (ref 0.0–2.0)
Bicarbonate: 42.6 mEq/L — ABNORMAL HIGH (ref 20.0–24.0)
Drawn by: 22223
O2 Content: 4 L/min
O2 Saturation: 98.8 %
PCO2 ART: 101 mmHg — AB (ref 35.0–45.0)
PO2 ART: 145 mmHg — AB (ref 80.0–100.0)
Patient temperature: 37
TCO2: 40.8 mmol/L (ref 0–100)
pH, Arterial: 7.25 — ABNORMAL LOW (ref 7.350–7.450)

## 2013-07-30 LAB — COMPREHENSIVE METABOLIC PANEL
ALBUMIN: 2.8 g/dL — AB (ref 3.5–5.2)
ALT: 18 U/L (ref 0–35)
AST: 28 U/L (ref 0–37)
Alkaline Phosphatase: 97 U/L (ref 39–117)
BUN: 22 mg/dL (ref 6–23)
CO2: 41 mEq/L (ref 19–32)
Calcium: 10.1 mg/dL (ref 8.4–10.5)
Chloride: 88 mEq/L — ABNORMAL LOW (ref 96–112)
Creatinine, Ser: 0.92 mg/dL (ref 0.50–1.10)
GFR calc Af Amer: 72 mL/min — ABNORMAL LOW (ref >90)
GFR calc non Af Amer: 62 mL/min — ABNORMAL LOW (ref >90)
GLUCOSE: 169 mg/dL — AB (ref 70–99)
Potassium: 4.5 mEq/L (ref 3.7–5.3)
Sodium: 136 mEq/L — ABNORMAL LOW (ref 137–147)
TOTAL PROTEIN: 7.3 g/dL (ref 6.0–8.3)
Total Bilirubin: 0.3 mg/dL (ref 0.3–1.2)

## 2013-07-30 LAB — PROTIME-INR
INR: 1.1 (ref 0.00–1.49)
Prothrombin Time: 14 seconds (ref 11.6–15.2)

## 2013-07-30 MED ORDER — CITALOPRAM HYDROBROMIDE 20 MG PO TABS: 10.0000 mg | ORAL_TABLET | Freq: Every day | ORAL | Status: DC

## 2013-07-30 MED ORDER — LORAZEPAM 2 MG/ML IJ SOLN
1.0000 mg | Freq: Four times a day (QID) | INTRAMUSCULAR | Status: AC | PRN
  Administered 2013-07-31 – 2013-08-02 (×5): 1 mg via INTRAVENOUS
  Filled 2013-07-30 (×6): qty 1

## 2013-07-30 MED ORDER — BIOTENE DRY MOUTH MT LIQD
15.0000 mL | Freq: Two times a day (BID) | OROMUCOSAL | Status: DC
  Administered 2013-07-30 – 2013-08-05 (×13): 15 mL via OROMUCOSAL

## 2013-07-30 MED ORDER — LORAZEPAM 0.5 MG PO TABS: 1.0000 mg | ORAL_TABLET | Freq: Four times a day (QID) | ORAL | Status: AC | PRN

## 2013-07-30 NOTE — Care Management Note (Addendum)
    Page 1 of 2   08/05/2013     2:35:13 PM CARE MANAGEMENT NOTE 08/05/2013  Patient:  [name], [name]   Account Number:  [id number]  Date Initiated:  07/30/2013  Documentation initiated by:  Claretha Cooper  Subjective/Objective Assessment:   Pt from home with spouse. PT evaluation recommended SNF.     Action/Plan:   CSW will assist in placement   Anticipated DC Date:     Anticipated DC Plan:  SKILLED NURSING FACILITY  In-house referral  Clinical Social Worker      DC Forensic scientist  CM consult      [place] Choice  HOME HEALTH   Choice offered to / List presented to:  C-1 Patient        [place] arranged  HH-1 RN  [place].   Status of service:  Completed, signed off Medicare Important Message given?  YES (If response is "NO", the following Medicare IM given date fields will be blank) Date Medicare IM given:  08/05/2013 Date Additional Medicare IM given:    Discharge Disposition:  [place]  Per UR Regulation:    If discussed at Long Length of Stay Meetings, dates discussed:   08/03/2013  08/05/2013    Comments:  08/05/13 [place], RN BSN CM Pt discharged home today with [facility] RN and PT (per husband choice). Emma with [facility] is aware and will collect the pts information from the chart. [place] services to start within 48 hours of discharge. No DME needs noted. Pts husband to bring portable O2 from home for pts transport home. Pt and pts nurse aware of discharge arrangements.  08/03/13 [place], RN BSN CM CM spoke with pt, pts husband, and pts daughter in regards to D/C planning. They all agree that pt would do better at home than placement. Pt has all DME needed in the home. Pt has home O2 with [facility]. Pts daughter lives next door and is with the pt daily. They are in agreement with [facility] Rn and PT. Will continue to follow for discharge planning needs.  07/30/13 Claretha Cooper RN BSN  CM

## 2013-07-30 NOTE — Evaluation (Signed)
Physical Therapy Evaluation Patient Details Name: [name] MRN: [id number] DOB: Oct 11, 1945 Today's Date: 07/30/2013   History of Present Illness  68 year old woman with history of oxygen-dependent COPD presents the emergency department after being found to have acute on chronic hypoxia and her primary care physician's office. Daughter reported several week history of failure to thrive, confusion, stumbling, gradually worsening hand tremors. Initial evaluation suggested community acquired pneumonia, COPD exacerbation  Clinical Impression  Patient sleeping in bed upon arrival with difficulty arrousing form therapy patient displayed a rather flat affect throughout therapy and was very lethargic with occasional tremor when resting or initiating movements. Patient required moderate assistance due to difficulty following verbal directions and need for moderate assistance to initiate bed mobility, transfers, and walking. Unsure how much patient will improve with therapy as patient currently displays difficulty following directions and an overall lack of desire to perform exercises. Recommending SNF for discharge to help patient regain strength, activity tolerance so patient can return home with care provided by her husband and family.    Follow Up Recommendations SNF          Precautions / Restrictions Precautions Precautions: Fall Precaution Comments: generalized weakness and tremoring Restrictions Weight Bearing Restrictions: No      Mobility  Bed Mobility Overal bed mobility: Needs Assistance Bed Mobility: Rolling;Sidelying to Sit Rolling: Mod assist Sidelying to sit: Mod assist          Transfers Overall transfer level: Needs assistance Equipment used: Rolling walker (2 wheeled) Transfers: Sit to/from Stand Sit to Stand: Mod assist            Ambulation/Gait Ambulation/Gait assistance: Mod assist Ambulation Distance (Feet): 5 Feet Assistive device: Rolling walker  (2 wheeled) Gait Pattern/deviations: Shuffle (tremor throughout gait.)          Balance Overall balance assessment: Needs assistance Sitting-balance support: Bilateral upper extremity supported Sitting balance-Leahy Scale: Poor   Postural control: Posterior lean Standing balance support: Bilateral upper extremity supported Standing balance-Leahy Scale: Poor Standing balance comment: required moderate assitance from therapis tto pmaintain pbaolance and for weight shifting to perform transfer to bedside commode.                              Pertinent Vitals/Pain No pain    Home Living Family/patient expects to be discharged to:: Private residence Living Arrangements: Spouse/significant other Available Help at Discharge: Family Type of Home:  (patient unable to specify)                Prior Function  Comments: Patient was not able to specify, suspect patient required assitance.      Hand Dominance   Dominant Hand: Right    Extremity/Trunk Assessment     Lower Extremity Assessment: Generalized weakness  Cervical / Trunk Assessment: Kyphotic  Communication   Communication: Expressive difficulties  Cognition Arousal/Alertness: Lethargic Behavior During Therapy: Flat affect Overall Cognitive Status: Difficult to assess                               Assessment/Plan    PT Assessment Patient needs continued PT services  PT Diagnosis Difficulty walking;Generalized weakness   PT Problem List Decreased strength;Decreased range of motion;Decreased activity tolerance;Decreased balance;Decreased mobility;Decreased coordination;Decreased cognition  PT Treatment Interventions Gait training;Stair training;Functional mobility training;Therapeutic activities;Therapeutic exercise;Balance training;Patient/family education   PT Goals (Current goals can be found in the Care Plan section)  Acute Rehab PT Goals Patient Stated Goal: to walk 20 ft with mod  assit and least restricive device.  Time For Goal Achievement: 08/06/13 Potential to Achieve Goals: Good    Frequency Min 3X/week   Barriers to discharge   Patient will need 24hour support at current status and displays limited ability to learn       End of Session Equipment Utilized During Treatment: Gait belt Activity Tolerance: Patient limited by fatigue;Patient limited by lethargy Patient left: in bed;with call bell/phone within reach;with bed alarm set Nurse Communication: Mobility status         Time: 1300-1320 PT Time Calculation (min): 20 min   Charges:   PT Evaluation $Initial PT Evaluation Tier I: 1 Procedure PT Treatments $Therapeutic Activity: 8-22 mins        Andreya Lacks R Kalven Ganim DPT 07/30/2013, 1:36 PM

## 2013-07-30 NOTE — Telephone Encounter (Signed)
In hospital. 

## 2013-07-30 NOTE — Progress Notes (Signed)
Tried putting BiPAP on PT she pulled mask off, had to spit, PT is awake now and non compliant for most part.

## 2013-07-30 NOTE — Progress Notes (Signed)
Lab called a critical CO2 of 41. Contacted MD on call to inform of this. Awaiting response.

## 2013-07-30 NOTE — Progress Notes (Signed)
PROGRESS NOTE  [name] JEH:[id number] DOB: 1946/01/18 DOA: 07/29/2013 PCP: Redge Gainer, MD  Summary: 68 year old woman with history of oxygen-dependent COPD presents the emergency department after being found to have acute on chronic hypoxia and her primary care physician's office. Daughter reported several week history of failure to thrive, confusion, stumbling, gradually worsening hand tremors. Initial evaluation suggested community acquired pneumonia, COPD exacerbation  Assessment/Plan: 1. Acute on chronic hypoxic respiratory failure. Improved today. Likely secondary to COPD and community acquired pneumonia. Maintained on on 2 L nasal cannula at home. 2. Community acquired pneumonia. Appears to be clinically improving. 3. Possible COPD exacerbation. Appears to be improving although air movement is poor. 4. Chronic hypoxic, hypercarbic respiratory failure, COPD, pulmonary fibrosis. I do not see any evidence of heart failure at this time. 5. Failure to thrive, subacute encephalopathy for the last 3 weeks. Family described repeating questions, memory loss, stumbling, gradually worsening hand tremors for the last month. Celexa added late March for depression-like symptoms--may be causing tremors, extrapyramidal-like symptoms, asthenia and FTT. Also consider alcohol affect, Xanax as contributing. Overall clinically improved no evidence of acute encephalopathy at this point. MRI could be considered as an outpatient if fails to improve off Celexa. 6. Tremor. As above. Possibly related to medication or alcohol abuse. Consider Parkinsonian syndrome. 7. Hyperglycemia. Monitor clinically. 8. Hypokalemia. Resolved with repletion. 9. Macrocytic anemia. Etiology unclear, no evidence of bleeding at this time. Possible alcohol related. 10. Chronic pain. Appears stable. 11. Alcohol abuse. Monitor for withdrawal. 12. History of psoriasis, on Humira. 13. History of tobacco dependence in  remission   Wean oxygen 2 L continuous. Continue empiric antibiotics, steroids, bronchodilators.  Decrease Celexa to 10mg  daily and wean off.  Check hemoglobin A1c.  Followup anemia panel, recheck CBC in AM.   CIWA.  PT consult.  Home possibly 1-2 days  Code Status: full code DVT prophylaxis: heparin Family Communication: none present Disposition Plan: home when improved  Maureen Hodgkins, MD  [facility]  Pager [phone] If 7PM-7AM, please contact night-coverage at [email or web address], password [facility] 07/30/2013, 9:10 AM  LOS: 1 day   Consultants:    Procedures:    Antibiotics:  Azithromycin 5/14 >>   Ceftriaxone 5/14 >>   HPI/Subjective: Breathing better today. Feeling better. Eating better. No nausea, vomiting or abdominal pain.  Objective: Filed Vitals:   07/29/13 2016 07/29/13 2331 07/30/13 0707 07/30/13 0709  BP: 119/53   112/54  Pulse: 109   102  Temp: 98.8 F (37.1 C)   98.2 F (36.8 C)  TempSrc: Oral   Oral  Resp: 22   20  Height: 4\' 5"  (1.346 m)     Weight: 55.1 kg (121 lb 7.6 oz)     SpO2: 95% 93% 98% 97%    Intake/Output Summary (Last 24 hours) at 07/30/13 0910 Last data filed at 07/30/13 0600  Gross per 24 hour  Intake      0 ml  Output    950 ml  Net   -950 ml     Filed Weights   07/29/13 1634 07/29/13 2016  Weight: 53.978 kg (119 lb) 55.1 kg (121 lb 7.6 oz)    Exam:   Afebrile, vital signs stable, hypoxia appears stable.  Gen. Appears calm and comfortable. Nontoxic.  Psychiatric. Grossly normal mood and affect. Whispers but speech is fluent and appropriate. Oriented to self, location, month, year.  Neurologic. Cranial nerves 2-12 intact. No dysdiadochokinesis of the upper extremities. No pronator drift. Grossly normal strength bilateral upper and  lower extremities. No focal deficits. Slight upper extremity tremor, right greater than left.  Cardiovascular. Regular rate and rhythm. No murmur, rub or gallop. No lower  extremity edema.  Respiratory. Poor air movement. No frank wheezes, rales or rhonchi. Mild increased respiratory effort. Able to speak in full sentences  Abdomen soft, nontender, nondistended.  Data Reviewed:  Complete metabolic panel unremarkable except for hypercarbia.   Hemoglobin stable, 9.6. Mild leukopenia without significant change.  Scheduled Meds: . adalimumab  40 mg Subcutaneous Q14 Days  . ALPRAZolam  0.5 mg Oral BID  . antiseptic oral rinse  15 mL Mouth Rinse BID  . aspirin  325 mg Oral Daily  . azithromycin  250 mg Intravenous Q24H  . cefTRIAXone (ROCEPHIN)  IV  1 g Intravenous Q24H  . cholecalciferol  1,000 Units Oral Daily  . citalopram  20 mg Oral Daily  . folic acid  1 mg Oral Daily  . furosemide  20 mg Oral Daily  . heparin  5,000 Units Subcutaneous 3 times per day  . ipratropium-albuterol  3 mL Nebulization Q4H  . loratadine  10 mg Oral Daily  . methylPREDNISolone (SOLU-MEDROL) injection  60 mg Intravenous Q12H  . metoprolol  50 mg Oral BID  . mometasone-formoterol  2 puff Inhalation BID  . multivitamin with minerals  1 tablet Oral Daily  . senna  1 tablet Oral BID  . sodium chloride  3 mL Intravenous Q12H  . thiamine  100 mg Oral Daily   Continuous Infusions:   Principal Problem:   Community acquired pneumonia Active Problems:   COPD   Hyperlipidemia   HTN (hypertension)   CHF (congestive heart failure)   COPD exacerbation   Time spent 25 minutes

## 2013-07-30 NOTE — Progress Notes (Signed)
Patient had trouble with responsiveness earlier in day.  When arrived on shift, patient would respond, but would go back to sleep. Very lethargic.  Notified mid-level on call, received order for telemetry monitoring and ABG.  ABG done and results called back to mid-level.  Received order to transfer patient to stepdown to be placed on bipap.  Report given to icu nurse, patient transferred.  atttempted to notify family ( husband and daughter) received no call back.

## 2013-07-31 DIAGNOSIS — J9602 Acute respiratory failure with hypercapnia: Secondary | ICD-10-CM

## 2013-07-31 DIAGNOSIS — J9600 Acute respiratory failure, unspecified whether with hypoxia or hypercapnia: Secondary | ICD-10-CM

## 2013-07-31 DIAGNOSIS — R627 Adult failure to thrive: Secondary | ICD-10-CM

## 2013-07-31 LAB — GLUCOSE, CAPILLARY
GLUCOSE-CAPILLARY: 152 mg/dL — AB (ref 70–99)
GLUCOSE-CAPILLARY: 147 mg/dL — AB (ref 70–99)
Glucose-Capillary: 129 mg/dL — ABNORMAL HIGH (ref 70–99)
Glucose-Capillary: 191 mg/dL — ABNORMAL HIGH (ref 70–99)

## 2013-07-31 LAB — CBC
HEMATOCRIT: 31 % — AB (ref 36.0–46.0)
Hemoglobin: 9.9 g/dL — ABNORMAL LOW (ref 12.0–15.0)
MCH: 32.9 pg (ref 26.0–34.0)
MCHC: 31.9 g/dL (ref 30.0–36.0)
MCV: 103 fL — AB (ref 78.0–100.0)
Platelets: 204 10*3/uL (ref 150–400)
RBC: 3.01 MIL/uL — ABNORMAL LOW (ref 3.87–5.11)
RDW: 12.7 % (ref 11.5–15.5)
WBC: 4.3 10*3/uL (ref 4.0–10.5)

## 2013-07-31 LAB — BLOOD GAS, ARTERIAL
Acid-Base Excess: 15.8 mmol/L — ABNORMAL HIGH (ref 0.0–2.0)
Bicarbonate: 42.7 mEq/L — ABNORMAL HIGH (ref 20.0–24.0)
O2 CONTENT: 3 L/min
O2 Saturation: 97.7 %
PCO2 ART: 88.5 mmHg — AB (ref 35.0–45.0)
PH ART: 7.305 — AB (ref 7.350–7.450)
PO2 ART: 103 mmHg — AB (ref 80.0–100.0)
Patient temperature: 37
TCO2: 40.2 mmol/L (ref 0–100)

## 2013-07-31 LAB — MRSA PCR SCREENING: MRSA by PCR: NEGATIVE

## 2013-07-31 LAB — HEMOGLOBIN A1C
Hgb A1c MFr Bld: 5.5 % (ref <5.7)
MEAN PLASMA GLUCOSE: 111 mg/dL (ref <117)

## 2013-07-31 MED ORDER — METHYLPREDNISOLONE SODIUM SUCC 125 MG IJ SOLR
60.0000 mg | Freq: Four times a day (QID) | INTRAMUSCULAR | Status: DC
  Administered 2013-07-31 – 2013-08-03 (×13): 60 mg via INTRAVENOUS
  Filled 2013-07-31 (×13): qty 2

## 2013-07-31 MED ORDER — HYDRALAZINE HCL 20 MG/ML IJ SOLN
5.0000 mg | Freq: Four times a day (QID) | INTRAMUSCULAR | Status: DC | PRN
  Administered 2013-07-31 – 2013-08-01 (×2): 5 mg via INTRAVENOUS
  Filled 2013-07-31 (×2): qty 1

## 2013-07-31 MED ORDER — DEXTROSE 5 % IV SOLN
INTRAVENOUS | Status: AC
  Filled 2013-07-31: qty 10

## 2013-07-31 NOTE — Progress Notes (Signed)
Patient agreed to try Bi-pap and explained importance for her blood gases (especailly her CO2 number to go down).

## 2013-07-31 NOTE — Progress Notes (Addendum)
PT appears confused most likely from high PCO2 and ETOH withdrawal ? Will continue to monitor will place on BiPAP if PT will allow, for now she will not. Looking for resp failure to continue and worsen again.

## 2013-07-31 NOTE — Consult Note (Signed)
Consult requested by: [facility] Consult requested for acute on chronic respiratory failure:  HPI: This is a 68 year old Caucasian female who was sent to the hospital because of increased shortness of breath and hypoxia. She went to her primary care physician's office with complaints of shortness of breath and was found to be hypoxic and felt to have pneumonia. She was admitted and initially was in a medical surgical bed but then had increasing problems with being sleepy and was found to have a markedly elevated PCO2 and was transferred to step down. Plan at that time was put her on BiPAP but she has refused to wear it. Her situation is complicated to some extent by the fact that she appears to be having some alcohol withdrawal . She has known severe COPD. Her PCO2 is better but she is still very sleepy  Past Medical History  Diagnosis Date  . HTN (hypertension)   . COPD (chronic obstructive pulmonary disease)   . Anxiety disorder   . Hyperglycemia   . Psoriasis   . Avascular necrosis of hip     left  . ETOH abuse      Family History  Problem Relation Age of Onset  . Heart disease Father   . Hyperlipidemia Father   . Cancer Paternal Grandfather   . Alzheimer's disease Mother      History   Social History  . Marital Status: Married    Spouse Name: N/A    Number of Children: N/A  . Years of Education: N/A   Occupational History  . housewife    Social History Main Topics  . Smoking status: Former Smoker -- 1.00 packs/day for 35 years    Quit date: 02/15/2009  . Smokeless tobacco: None  . Alcohol Use: Yes     Comment: hx of abuse and withdrawal  . Drug Use: No  . Sexual Activity: None   Other Topics Concern  . None   Social History Narrative  . None     ROS: Not obtainable    Objective: Vital signs in last 24 hours: Temp:  [97.2 F (36.2 C)-97.9 F (36.6 C)] 97.3 F (36.3 C) (05/16 0400) Pulse Rate:  [65-86] 78 (05/16 0500) Resp:  [16-30] 26 (05/16  0700) BP: (106-175)/(54-160) 119/56 mmHg (05/16 0700) SpO2:  [85 %-100 %] 100 % (05/16 0809) Weight:  [54.5 kg (120 lb 2.4 oz)-55.6 kg (122 lb 9.2 oz)] 55.6 kg (122 lb 9.2 oz) (05/16 0500) Weight change: 0.522 kg (1 lb 2.4 oz) Last BM Date: 07/29/13  Intake/Output from previous day: 05/15 0701 - 05/16 0700 In: 1035 [P.O.:560; IV Piggyback:475] Out: 900 [Urine:900]  PHYSICAL EXAM She is very sleepy. She will open her eyes. Does not appear to be in any acute distress. Her mucous membranes are moist. Her neck is supple. Her chest shows end expiratory wheezes bilaterally. Her heart is regular without gallop. Her abdomen is soft no masses are felt. Extremities showed no edema and her central exam  shows she is very sleepy  Lab Results: Basic Metabolic Panel:  Recent Labs  07/29/13 1734 07/30/13 0537  NA 137 136*  K 3.5* 4.5  CL 84* 88*  CO2 44* 41*  GLUCOSE 129* 169*  BUN 19 22  CREATININE 0.79 0.92  CALCIUM 10.8* 10.1   Liver Function Tests:  Recent Labs  07/29/13 1734 07/30/13 0537  AST 28 28  ALT 16 18  ALKPHOS 96 97  BILITOT 0.6 0.3  PROT 7.9 7.3  ALBUMIN 3.2* 2.8*  No results found for this basename: LIPASE, AMYLASE,  in the last 72 hours No results found for this basename: AMMONIA,  in the last 72 hours CBC:  Recent Labs  07/29/13 1734 07/30/13 0537 07/31/13 0508  WBC 3.1* 3.3* 4.3  NEUTROABS 1.9  --   --   HGB 10.3* 9.6* 9.9*  HCT 32.5* 29.8* 31.0*  MCV 101.9* 102.4* 103.0*  PLT 168 170 204   Cardiac Enzymes:  Recent Labs  07/29/13 1734  TROPONINI <0.30   BNP:  Recent Labs  07/29/13 1734  PROBNP 3332.0*   D-Dimer: No results found for this basename: DDIMER,  in the last 72 hours CBG:  Recent Labs  07/30/13 0729 07/30/13 1207 07/30/13 1654 07/30/13 2026 07/31/13 0900  GLUCAP 141* 160* 134* 165* 129*   Hemoglobin A1C: No results found for this basename: HGBA1C,  in the last 72 hours Fasting Lipid Panel: No results found for  this basename: CHOL, HDL, LDLCALC, TRIG, CHOLHDL, LDLDIRECT,  in the last 72 hours Thyroid Function Tests: No results found for this basename: TSH, T4TOTAL, FREET4, T3FREE, THYROIDAB,  in the last 72 hours Anemia Panel:  Recent Labs  07/29/13 2019  TIBC 237*  IRON 20*   Coagulation:  Recent Labs  07/30/13 0537  LABPROT 14.0  INR 1.10   Urine Drug Screen: Drugs of Abuse  No results found for this basename: labopia, cocainscrnur, labbenz, amphetmu, thcu, labbarb    Alcohol Level: No results found for this basename: ETH,  in the last 72 hours Urinalysis:  Recent Labs  07/29/13 [place] 1.020  PHURINE 6.0  [place]  PROTEINUR TRACE*  UROBILINOGEN 0.2  NITRITE NEGATIVE  [place]. Labs:   ABGS:  Recent Labs  07/31/13 0015  PHART 7.305*  PO2ART 103.0*  TCO2 40.2  HCO3 42.7*     MICROBIOLOGY: Recent Results (from the past 240 hour(s))  MRSA PCR SCREENING     Status: None   Collection Time    07/30/13  9:16 PM      Result Value Ref Range Status   MRSA by PCR NEGATIVE  NEGATIVE Final   Comment:            The GeneXpert MRSA Assay (FDA     approved for NASAL specimens     only), is one component of a     comprehensive MRSA colonization     surveillance program. It is not     intended to diagnose MRSA     infection nor to guide or     monitor treatment for     MRSA infections.    Studies/Results: Dg Chest 2 View  07/29/2013   CLINICAL DATA:  Shortness of breath.  EXAM: CHEST  2 VIEW  COMPARISON:  05/17/2013  FINDINGS: Lungs are adequately inflated and demonstrate slight worsening right base opacification as cannot exclude acute on chronic process due to infection. Stable coarse interstitial changes over the lung apices. Cardiomediastinal silhouette and remainder of the exam is unchanged.  IMPRESSION: Slight worsening opacification in the right  base as cannot exclude acute infection on a background of chronic changes. Stable chronic apical coarse interstitial changes.   Electronically Signed   By: Marin Olp M.D.   On: 07/29/2013 18:18    Medications:  Prior to Admission:  Prescriptions prior to admission  Medication Sig Dispense Refill  . adalimumab (HUMIRA PEN) 40 MG/0.8ML injection Inject  40 mg into the skin every 14 (fourteen) days.       Marland Kitchen albuterol (PROAIR HFA) 108 (90 BASE) MCG/ACT inhaler Inhale 2 puffs into the lungs every 6 (six) hours as needed for wheezing or shortness of breath.  3 Inhaler  1  . albuterol (PROVENTIL) (2.5 MG/3ML) 0.083% nebulizer solution Take 3 mLs (2.5 mg total) by nebulization every 6 (six) hours as needed for wheezing or shortness of breath.  150 mL  1  . ALPRAZolam (XANAX) 0.5 MG tablet Take 1 tablet (0.5 mg total) by mouth 2 (two) times daily.  180 tablet  0  . aspirin 325 MG tablet Take 325 mg by mouth daily.        . cetirizine (ALL DAY ALLERGY) 10 MG tablet Take 10 mg by mouth daily.      . cholecalciferol (VITAMIN D) 1000 UNITS tablet Take 1,000 Units by mouth daily.      . citalopram (CELEXA) 20 MG tablet Take 1 tablet (20 mg total) by mouth daily.  30 tablet  3  . EQL CALCIUM CITRATE/VITAMIN D PO Take by mouth. Vitamin D 500IU with Calcium Citrate 630mg       . furosemide (LASIX) 20 MG tablet Take 1 tablet (20 mg total) by mouth daily.  30 tablet  3  . guaifenesin (MUCUS RELIEF) 400 MG TABS tablet Take 400 mg by mouth daily as needed (for ocngestion).      Marland Kitchen HYDROcodone-acetaminophen (NORCO) 10-325 MG per tablet Take 1 tablet by mouth every 8 (eight) hours.      . metoprolol (LOPRESSOR) 50 MG tablet Take 1 tablet (50 mg total) by mouth 2 (two) times daily.  180 tablet  3  . tiotropium (SPIRIVA) 18 MCG inhalation capsule Place 18 mcg into inhaler and inhale every morning.        Scheduled: . antiseptic oral rinse  15 mL Mouth Rinse BID  . aspirin  325 mg Oral Daily  . azithromycin  250 mg  Intravenous Q24H  . cefTRIAXone (ROCEPHIN)  IV  1 g Intravenous Q24H  . cholecalciferol  1,000 Units Oral Daily  . citalopram  10 mg Oral Daily  . folic acid  1 mg Oral Daily  . furosemide  20 mg Oral Daily  . heparin  5,000 Units Subcutaneous 3 times per day  . ipratropium-albuterol  3 mL Nebulization Q4H  . loratadine  10 mg Oral Daily  . methylPREDNISolone (SOLU-MEDROL) injection  60 mg Intravenous Q12H  . metoprolol  50 mg Oral BID  . multivitamin with minerals  1 tablet Oral Daily  . senna  1 tablet Oral BID  . sodium chloride  3 mL Intravenous Q12H  . thiamine  100 mg Oral Daily   Continuous:  MHD:QQIWLN chloride, acetaminophen, acetaminophen, alum & mag hydroxide-simeth, amitriptyline, bisacodyl, guaiFENesin, HYDROcodone-acetaminophen, LORazepam, LORazepam, ondansetron (ZOFRAN) IV, ondansetron, sodium chloride  Assesment: She was admitted with community-acquired pneumonia. She has metabolic encephalopathy which I think is mostly from acute on chronic respiratory failure. She has a history of congestive heart failure and that seems pretty stable. She has a history of alcohol abuse and is being treated for potential withdrawal. Principal Problem:   Community acquired pneumonia Active Problems:   COPD   Hyperlipidemia   HTN (hypertension)   CHF (congestive heart failure)   COPD exacerbation   Encephalopathy    Plan: Since she will not wear her BiPAP I think we're going to simply follow clinically. Continue with IV antibiotics steroids etc.  LOS: 2 days   Alonza Bogus 07/31/2013, 9:03 AM

## 2013-07-31 NOTE — Progress Notes (Signed)
Tried Pt on BiPAP again she pulls off mask. Will not keep mask on.

## 2013-07-31 NOTE — Progress Notes (Signed)
Patient has had increased confusion throughout the shift.  She was more alert at the beginning when switched to ICU.  The patient is trying to crawl out the bed periodically.

## 2013-07-31 NOTE — Progress Notes (Signed)
Pt still has not worn BiPAP will try to convince her to wear mask.

## 2013-07-31 NOTE — Progress Notes (Addendum)
PROGRESS NOTE  [name] ION:[id number] DOB: January 09, 1946 DOA: 07/29/2013 PCP: Redge Gainer, MD  Summary: 68 year old woman with history of oxygen-dependent COPD presents the emergency department after being found to have acute on chronic hypoxia and her primary care physician's office. Daughter reported several week history of failure to thrive, confusion, stumbling, gradually worsening hand tremors. Initial evaluation suggested community acquired pneumonia, COPD exacerbation  Assessment/Plan: 1. Acute on chronic hypoxic respiratory failure with acute respiratory acidosis and hypercapnia. Improved today. Decompensated overnight, would not tolerate more than brief BiPAP. Seems to be somewhat better today. Likely secondary to COPD and community acquired pneumonia. Maintained on on 2 L nasal cannula at home. 2. Community acquired pneumonia. Appears stable. 3. Possible COPD exacerbation. Seems to have worsened somewhat. 4. Chronic hypoxic, hypercarbic respiratory failure, COPD, pulmonary fibrosis.  5. Failure to thrive, subacute encephalopathy for the last 3 weeks. Family described repeating questions, memory loss, stumbling, gradually worsening hand tremors for the last month. Celexa added late March for depression-like symptoms--may be causing tremors, extrapyramidal-like symptoms, asthenia and FTT. Also consider alcohol affect, Xanax as contributing.  6. Tremor. As above. Possibly related to medication or alcohol abuse. Consider Parkinsonian syndrome. 7. Hyperglycemia. Monitor clinically. 8. Macrocytic anemia. Stable. Etiology unclear, no evidence of bleeding at this time. Possible alcohol related. 9. Chronic pain.  10. Alcohol abuse, possible withdrawal.  11. History of psoriasis, on Humira. 12. History of tobacco dependence in remission   Continue steroids, bronchodilators, supplemental oxygen. BiPAP as needed if the patient will tolerate. Pulmonology consultation appreciated- Plan to  follow clinically as the patient is unlikely to tolerate BiPAP and does not require intubation at this point..  Continue CIWA for possible withdrawal  Addendum 1400 Discussed with daughter, patient's husband at bedside, reviewed current issues and treatment plan. Communicated guarded prognosis, patient remains acutely ill. We discussed intolerance to BiPAP and resp acidosis. We discussed intubation. They wish to avoid intubation if possible but would pursue if needed.  Vitals remain stable. Patient awake earlier today and consumed some tea. Arouses with tactile stimulus, moves all extremities, but somnolent. Continue to monitor clinically.  Addendum 1700. Patient alert, eating dinner, converses normally.  Code Status: full code DVT prophylaxis: heparin Family Communication: none present Disposition Plan: home when improved  [name], MD  [facility]  Pager [phone] If 7PM-7AM, please contact night-coverage at [email or web address], password [facility] 07/31/2013, 9:02 AM  LOS: 2 days   Consultants:    Procedures:    Antibiotics:  Azithromycin 5/14 >>   Ceftriaxone 5/14 >>   HPI/Subjective: Overnight events noted. Patient was noted to be somnolent in evening, ABG revealed acute respiratory acidosis with hypercapnia the patient was transferred to the step down unit. However she refused to wear BiPAP for any significant length. Despite this ABG last night appear improved. She is not be confused throughout the night.  This morning she is briefly arousable but offers no history Although she does follow commands.  Objective: Filed Vitals:   07/31/13 0500 07/31/13 0600 07/31/13 0700 07/31/13 0809  BP: 113/67 131/85 119/56   Pulse: 78     Temp:      TempSrc:      Resp: 18 23 26    Height:      Weight: 55.6 kg (122 lb 9.2 oz)     SpO2: 98% 97%  100%    Intake/Output Summary (Last 24 hours) at 07/31/13 0902 Last data filed at 07/31/13 0500  Gross per 24 hour  Intake    1035  ml  Output    900 ml  Net    135 ml     Filed Weights   07/29/13 2016 07/30/13 2115 07/31/13 0500  Weight: 55.1 kg (121 lb 7.6 oz) 54.5 kg (120 lb 2.4 oz) 55.6 kg (122 lb 9.2 oz)    Exam:   Afebrile, vital signs stable, hypoxia appears stable.  Gen. Appears calm, comfortable. Nontoxic.  Psychiatric. Briefly arouses to tactile stimulus. Follows commands moving all extremities. Briefly speaks before going back to sleep.  Cardiovascular. Regular rate and rhythm. No murmur, rub or gallop. No lower extremity edema. Telemetry sinus rhythm.  Respiratory. Bilateral wheezes, no rhonchi or rales. Normal respiratory effort. Fair air movement.  Abdomen soft nontender and nondistended.  Skin appears grossly unremarkable.  Musculoskeletal. Grossly normal tone and strength.  Data Reviewed: ABG    Component Value Date/Time   PHART 7.305* 07/31/2013 0015   PCO2ART 88.5* 07/31/2013 0015   PO2ART 103.0* 07/31/2013 0015   HCO3 42.7* 07/31/2013 0015   TCO2 40.2 07/31/2013 0015   O2SAT 97.7 07/31/2013 0015   ABG Hemoglobin stable.  Scheduled Meds: . antiseptic oral rinse  15 mL Mouth Rinse BID  . aspirin  325 mg Oral Daily  . azithromycin  250 mg Intravenous Q24H  . cefTRIAXone (ROCEPHIN)  IV  1 g Intravenous Q24H  . cholecalciferol  1,000 Units Oral Daily  . citalopram  10 mg Oral Daily  . folic acid  1 mg Oral Daily  . furosemide  20 mg Oral Daily  . heparin  5,000 Units Subcutaneous 3 times per day  . ipratropium-albuterol  3 mL Nebulization Q4H  . loratadine  10 mg Oral Daily  . methylPREDNISolone (SOLU-MEDROL) injection  60 mg Intravenous Q12H  . metoprolol  50 mg Oral BID  . multivitamin with minerals  1 tablet Oral Daily  . senna  1 tablet Oral BID  . sodium chloride  3 mL Intravenous Q12H  . thiamine  100 mg Oral Daily   Continuous Infusions:   Principal Problem:   Acute respiratory failure with hypercapnia Active Problems:   COPD   Hyperlipidemia   HTN  (hypertension)   COPD exacerbation   Community acquired pneumonia   Encephalopathy   FTT (failure to thrive) in adult   Time spent 35 minutes

## 2013-08-01 ENCOUNTER — Inpatient Hospital Stay (HOSPITAL_COMMUNITY): Payer: Medicare HMO

## 2013-08-01 DIAGNOSIS — G9340 Encephalopathy, unspecified: Secondary | ICD-10-CM

## 2013-08-01 LAB — BASIC METABOLIC PANEL
BUN: 23 mg/dL (ref 6–23)
CHLORIDE: 89 meq/L — AB (ref 96–112)
CO2: 42 mEq/L (ref 19–32)
Calcium: 10 mg/dL (ref 8.4–10.5)
Creatinine, Ser: 0.75 mg/dL (ref 0.50–1.10)
GFR calc Af Amer: >90 mL/min (ref >90)
GFR calc non Af Amer: 84 mL/min — ABNORMAL LOW (ref >90)
GLUCOSE: 159 mg/dL — AB (ref 70–99)
POTASSIUM: 4 meq/L (ref 3.7–5.3)
SODIUM: 141 meq/L (ref 137–147)

## 2013-08-01 LAB — CBC
HEMATOCRIT: 32.5 % — AB (ref 36.0–46.0)
HEMOGLOBIN: 10 g/dL — AB (ref 12.0–15.0)
MCH: 32.5 pg (ref 26.0–34.0)
MCHC: 30.8 g/dL (ref 30.0–36.0)
MCV: 105.5 fL — ABNORMAL HIGH (ref 78.0–100.0)
Platelets: 230 10*3/uL (ref 150–400)
RBC: 3.08 MIL/uL — AB (ref 3.87–5.11)
RDW: 12.8 % (ref 11.5–15.5)
WBC: 5.7 10*3/uL (ref 4.0–10.5)

## 2013-08-01 LAB — BLOOD GAS, ARTERIAL
Acid-Base Excess: 17.6 mmol/L — ABNORMAL HIGH (ref 0.0–2.0)
Bicarbonate: 43.8 mEq/L — ABNORMAL HIGH (ref 20.0–24.0)
Drawn by: 105551
O2 Content: 2 L/min
O2 SAT: 96.7 %
Patient temperature: 37
TCO2: 40.8 mmol/L (ref 0–100)
pCO2 arterial: 76.2 mmHg (ref 35.0–45.0)
pH, Arterial: 7.378 (ref 7.350–7.450)
pO2, Arterial: 86.3 mmHg (ref 80.0–100.0)

## 2013-08-01 LAB — GLUCOSE, CAPILLARY
GLUCOSE-CAPILLARY: 163 mg/dL — AB (ref 70–99)
GLUCOSE-CAPILLARY: 177 mg/dL — AB (ref 70–99)
GLUCOSE-CAPILLARY: 141 mg/dL — AB (ref 70–99)
GLUCOSE-CAPILLARY: 153 mg/dL — AB (ref 70–99)

## 2013-08-01 MED ORDER — INSULIN ASPART 100 UNIT/ML ~~LOC~~ SOLN
0.0000 [IU] | Freq: Three times a day (TID) | SUBCUTANEOUS | Status: DC
  Administered 2013-08-01: 1 [IU] via SUBCUTANEOUS
  Administered 2013-08-01 – 2013-08-02 (×3): 2 [IU] via SUBCUTANEOUS
  Administered 2013-08-03: 3 [IU] via SUBCUTANEOUS
  Administered 2013-08-03 – 2013-08-04 (×3): 2 [IU] via SUBCUTANEOUS
  Administered 2013-08-04 (×2): 1 [IU] via SUBCUTANEOUS
  Administered 2013-08-05: 2 [IU] via SUBCUTANEOUS
  Administered 2013-08-05: 1 [IU] via SUBCUTANEOUS

## 2013-08-01 MED ORDER — METOPROLOL TARTRATE 1 MG/ML IV SOLN
2.5000 mg | Freq: Once | INTRAVENOUS | Status: AC
  Administered 2013-08-01: 2.5 mg via INTRAVENOUS
  Filled 2013-08-01: qty 5

## 2013-08-01 MED ORDER — DILTIAZEM HCL 25 MG/5ML IV SOLN
15.0000 mg | Freq: Once | INTRAVENOUS | Status: AC
  Administered 2013-08-01: 15 mg via INTRAVENOUS
  Filled 2013-08-01: qty 5

## 2013-08-01 MED ORDER — DILTIAZEM HCL 60 MG PO TABS
60.0000 mg | ORAL_TABLET | Freq: Four times a day (QID) | ORAL | Status: DC
  Administered 2013-08-02 – 2013-08-05 (×14): 60 mg via ORAL
  Filled 2013-08-01 (×16): qty 1

## 2013-08-01 MED ORDER — FUROSEMIDE 20 MG PO TABS
20.0000 mg | ORAL_TABLET | Freq: Every day | ORAL | Status: DC
  Administered 2013-08-02 – 2013-08-05 (×4): 20 mg via ORAL
  Filled 2013-08-01 (×4): qty 1

## 2013-08-01 MED ORDER — FUROSEMIDE 10 MG/ML IJ SOLN
40.0000 mg | Freq: Once | INTRAMUSCULAR | Status: AC
  Administered 2013-08-01: 40 mg via INTRAVENOUS
  Filled 2013-08-01: qty 4

## 2013-08-01 MED ORDER — DEXTROSE 5 % IV SOLN
INTRAVENOUS | Status: AC
  Filled 2013-08-01: qty 500

## 2013-08-01 MED ORDER — DEXTROSE 5 % IV SOLN
INTRAVENOUS | Status: AC
  Filled 2013-08-01: qty 10

## 2013-08-01 NOTE — Progress Notes (Signed)
Subjective: She is still very sleepy. She is arousable but only barely  Objective: Vital signs in last 24 hours: Temp:  [97.9 F (36.6 C)-98.4 F (36.9 C)] 98.3 F (36.8 C) (05/17 0800) Pulse Rate:  [74-135] 109 (05/17 0900) Resp:  [20-37] 28 (05/17 0900) BP: (111-161)/(56-120) 119/58 mmHg (05/17 0900) SpO2:  [27 %-100 %] 99 % (05/17 0900) Weight:  [54.3 kg (119 lb 11.4 oz)] 54.3 kg (119 lb 11.4 oz) (05/17 0500) Weight change: -0.2 kg (-7.1 oz) Last BM Date: 07/29/13  Intake/Output from previous day: 05/16 0701 - 05/17 0700 In: 715 [P.O.:540; IV Piggyback:175] Out: 1100 [Urine:1100]  PHYSICAL EXAM General appearance: no distress and Barely arousable Resp: diminished breath sounds bilaterally Cardio: regular rate and rhythm, S1, S2 normal, no murmur, click, rub or gallop GI: soft, non-tender; bowel sounds normal; no masses,  no organomegaly Extremities: extremities normal, atraumatic, no cyanosis or edema  Lab Results:    Basic Metabolic Panel:  Recent Labs  07/30/13 0537 08/01/13 0502  NA 136* 141  K 4.5 4.0  CL 88* 89*  CO2 41* 42*  GLUCOSE 169* 159*  BUN 22 23  CREATININE 0.92 0.75  CALCIUM 10.1 10.0   Liver Function Tests:  Recent Labs  07/29/13 1734 07/30/13 0537  AST 28 28  ALT 16 18  ALKPHOS 96 97  BILITOT 0.6 0.3  PROT 7.9 7.3  ALBUMIN 3.2* 2.8*   No results found for this basename: LIPASE, AMYLASE,  in the last 72 hours No results found for this basename: AMMONIA,  in the last 72 hours CBC:  Recent Labs  07/29/13 1734  07/31/13 0508 08/01/13 0502  WBC 3.1*  < > 4.3 5.7  NEUTROABS 1.9  --   --   --   HGB 10.3*  < > 9.9* 10.0*  HCT 32.5*  < > 31.0* 32.5*  MCV 101.9*  < > 103.0* 105.5*  PLT 168  < > 204 230  < > = values in this interval not displayed. Cardiac Enzymes:  Recent Labs  07/29/13 1734  TROPONINI <0.30   BNP:  Recent Labs  07/29/13 1734  PROBNP 3332.0*   D-Dimer: No results found for this basename: DDIMER,  in  the last 72 hours CBG:  Recent Labs  07/30/13 2026 07/31/13 0900 07/31/13 1152 07/31/13 1717 07/31/13 2104 08/01/13 0727  GLUCAP 165* 129* 152* 147* 191* 163*   Hemoglobin A1C:  Recent Labs  07/31/13 0508  HGBA1C 5.5   Fasting Lipid Panel: No results found for this basename: CHOL, HDL, LDLCALC, TRIG, CHOLHDL, LDLDIRECT,  in the last 72 hours Thyroid Function Tests: No results found for this basename: TSH, T4TOTAL, FREET4, T3FREE, THYROIDAB,  in the last 72 hours Anemia Panel:  Recent Labs  07/29/13 2019  TIBC 237*  IRON 20*   Coagulation:  Recent Labs  07/30/13 0537  LABPROT 14.0  INR 1.10   Urine Drug Screen: Drugs of Abuse  No results found for this basename: labopia, cocainscrnur, labbenz, amphetmu, thcu, labbarb    Alcohol Level: No results found for this basename: ETH,  in the last 72 hours Urinalysis:  Recent Labs  07/29/13 [place] 1.020  PHURINE 6.0  [place]  PROTEINUR TRACE*  UROBILINOGEN 0.2  NITRITE NEGATIVE  [place]. Labs:  ABGS  Recent Labs  08/01/13 0353  PHART 7.378  PO2ART 86.3  TCO2 40.8  HCO3 43.8*   CULTURES  Recent Results (from the past 240 hour(s))  MRSA PCR SCREENING     Status: None   Collection Time    07/30/13  9:16 PM      Result Value Ref Range Status   MRSA by PCR NEGATIVE  NEGATIVE Final   Comment:            The GeneXpert MRSA Assay (FDA     approved for NASAL specimens     only), is one component of a     comprehensive MRSA colonization     surveillance program. It is not     intended to diagnose MRSA     infection nor to guide or     monitor treatment for     MRSA infections.   Studies/Results: Dg Chest Port 1 View  08/01/2013   CLINICAL DATA:  Respiratory failure.  EXAM: PORTABLE CHEST - 1 VIEW  COMPARISON:  07/29/2013.  FINDINGS: The heart remains normal in size. Stable prominence  of the interstitial markings. Cervical spine degenerative changes. Right rotator cuff calcific tendinitis.  IMPRESSION: Stable chronic interstitial lung disease.  No acute abnormality.   Electronically Signed   By: Enrique Sack M.D.   On: 08/01/2013 05:58    Medications:  Prior to Admission:  Prescriptions prior to admission  Medication Sig Dispense Refill  . adalimumab (HUMIRA PEN) 40 MG/0.8ML injection Inject 40 mg into the skin every 14 (fourteen) days.       Marland Kitchen albuterol (PROAIR HFA) 108 (90 BASE) MCG/ACT inhaler Inhale 2 puffs into the lungs every 6 (six) hours as needed for wheezing or shortness of breath.  3 Inhaler  1  . albuterol (PROVENTIL) (2.5 MG/3ML) 0.083% nebulizer solution Take 3 mLs (2.5 mg total) by nebulization every 6 (six) hours as needed for wheezing or shortness of breath.  150 mL  1  . ALPRAZolam (XANAX) 0.5 MG tablet Take 1 tablet (0.5 mg total) by mouth 2 (two) times daily.  180 tablet  0  . aspirin 325 MG tablet Take 325 mg by mouth daily.        . cetirizine (ALL DAY ALLERGY) 10 MG tablet Take 10 mg by mouth daily.      . cholecalciferol (VITAMIN D) 1000 UNITS tablet Take 1,000 Units by mouth daily.      . citalopram (CELEXA) 20 MG tablet Take 1 tablet (20 mg total) by mouth daily.  30 tablet  3  . EQL CALCIUM CITRATE/VITAMIN D PO Take by mouth. Vitamin D 500IU with Calcium Citrate 630mg       . furosemide (LASIX) 20 MG tablet Take 1 tablet (20 mg total) by mouth daily.  30 tablet  3  . guaifenesin (MUCUS RELIEF) 400 MG TABS tablet Take 400 mg by mouth daily as needed (for ocngestion).      Marland Kitchen HYDROcodone-acetaminophen (NORCO) 10-325 MG per tablet Take 1 tablet by mouth every 8 (eight) hours.      . metoprolol (LOPRESSOR) 50 MG tablet Take 1 tablet (50 mg total) by mouth 2 (two) times daily.  180 tablet  3  . tiotropium (SPIRIVA) 18 MCG inhalation capsule Place 18 mcg into inhaler and inhale every morning.        Scheduled: . antiseptic oral rinse  15 mL Mouth Rinse BID   . aspirin  325 mg Oral Daily  . azithromycin  250 mg Intravenous Q24H  . cefTRIAXone (ROCEPHIN)  IV  1 g Intravenous Q24H  . cholecalciferol  1,000 Units Oral Daily  .  diltiazem  60 mg Oral 4 times per day  . folic acid  1 mg Oral Daily  . furosemide  40 mg Intravenous Once  . [START ON 08/02/2013] furosemide  20 mg Oral Daily  . heparin  5,000 Units Subcutaneous 3 times per day  . ipratropium-albuterol  3 mL Nebulization Q4H  . loratadine  10 mg Oral Daily  . methylPREDNISolone (SOLU-MEDROL) injection  60 mg Intravenous Q6H  . multivitamin with minerals  1 tablet Oral Daily  . senna  1 tablet Oral BID  . sodium chloride  3 mL Intravenous Q12H  . thiamine  100 mg Oral Daily   Continuous:  RKY:[id number] chloride, acetaminophen, acetaminophen, alum & mag hydroxide-simeth, amitriptyline, bisacodyl, guaiFENesin, hydrALAZINE, LORazepam, LORazepam, ondansetron (ZOFRAN) IV, ondansetron, sodium chloride  Assesment: She was admitted with acute respiratory failure and I think she has chronic respiratory failure as well. This seems to have been precipitated by community acquired pneumonia. She also has severe COPD. She has been withdrawing from alcohol and is being treated for that which may be one of the reasons that she is so sleepy. Her pH and her PCO2 are improving. Principal Problem:   Acute respiratory failure with hypercapnia Active Problems:   COPD   Hyperlipidemia   HTN (hypertension)   COPD exacerbation   Community acquired pneumonia   Encephalopathy   FTT (failure to thrive) in adult    Plan: Continue current treatments    LOS: 3 days   Alonza Bogus 08/01/2013, 9:34 AM

## 2013-08-01 NOTE — Progress Notes (Signed)
PROGRESS NOTE  [name]:[id number] DOB: [birth year] DOA: 07/29/2013 PCP: Redge Gainer, MD  Summary: 68 year old woman with history of oxygen-dependent COPD presents the emergency department after being found to have acute on chronic hypoxia and her primary care physician's office. Daughter reported several week history of failure to thrive, confusion, stumbling, gradually worsening hand tremors. Initial evaluation suggested community acquired pneumonia, COPD exacerbation  Assessment/Plan: 1. Acute on chronic hypoxic respiratory failure with acute respiratory acidosis and hypercapnia. Difficult to manage with labile mental status. Somnolent for part of the day yesterday but was awake alert and eating dinner last night. ABG early this morning shows resolution of acute respiratory acidosis with compensated hypercapnia. 2. COPD exacerbation. Continue current management. 3. Acute encephalopathy secondary to respiratory failure. 4. Community acquired pneumonia. Appears stable. No evidence of infiltrate on chest x-ray. 5. Chronic hypoxic, hypercarbic respiratory failure, COPD, pulmonary fibrosis.  6. Failure to thrive, subacute encephalopathy for the last 3 weeks. Family described repeating questions, memory loss, stumbling, gradually worsening hand tremors for the last month. Celexa added late March for depression-like symptoms--suspect causing tremors, extrapyramidal-like symptoms, asthenia and FTT. Also consider alcohol affect, Xanax as contributing and family deny significant alcohol intake.  7. Tremor. As above. Suspect related to medications given history.  8. Steroid-induced hyperglycemia. Hemoglobin A1c normal. 9. Macrocytic anemia. Stable. Etiology unclear, no evidence of bleeding at this time. Followup as an outpatient. 10. Chronic pain.  11. History of remote alcohol abuse. No more than 2 drinks per day per family. 12. History of psoriasis, on Humira. 13. History of tobacco  dependence in remission   Remain in stepdown. Acute on chronic respiratory failure remains main issue, secondary to chronic hypoxic respiratory failure/COPD. Patient continues to refuse BiPAP or face mask. Per family she has extreme claustrophobia and this will not be an option without deep sedation. We had a long discussion yesterday 5/16, family prefers to avoid intubation if possible but intubation is acceptable. At the current moment she does not require intubation but her clinical condition remains guarded.  Continue antibiotics, oxygen, steroids, bronchodilators.  Start cardizem, avoid chronic BB Lopressor 50mg  BID given COPD  Sliding scale insulin.  Check echocardiogram, family describes h/o CHF.  Code Status: full code DVT prophylaxis: heparin Family Communication: none present Disposition Plan: home when improved  [name], MD  [facility]  Pager [phone] If 7PM-7AM, please contact night-coverage at [email or web address], password [facility] 08/01/2013, 9:22 AM  LOS: 3 days   Consultants:    Procedures:    Antibiotics:  Azithromycin 5/14 >>   Ceftriaxone 5/14 >>   HPI/Subjective: Noted to be confused and tachypneic as well as tachycardic overnight, refuses BiPAP and face mask. Was able to eat dinner last night. This morning somnolent.  Objective: Filed Vitals:   08/01/13 0655 08/01/13 0700 08/01/13 0800 08/01/13 0815  BP: 147/62 146/73 127/63   Pulse:  116 103   Temp:      TempSrc:      Resp:  29 25   Height:      Weight:      SpO2:  96% 93% 100%    Intake/Output Summary (Last 24 hours) at 08/01/13 0922 Last data filed at 08/01/13 0600  Gross per 24 hour  Intake    715 ml  Output   1100 ml  Net   -385 ml     Filed Weights   07/30/13 2115 07/31/13 0500 08/01/13 0500  Weight: 54.5 kg (120 lb 2.4 oz) 55.6 kg (122 lb 9.2  oz) 54.3 kg (119 lb 11.4 oz)    Exam:   Afebrile, vital signs stable, hypoxia stable.  Gen. Somnolent. Arouses to pain,  moves all extremities. Briefly mumbles but does not participate in exam.  Eyes. Pupils equal, round, react to light. Normal lids, irises.  ENT. Lips appear grossly unremarkable.  Cardiovascular. Tachycardic, regular rhythm. No murmur, rub or gallop. No lower extremity edema. Dorsalis pedis pulses 2+ bilaterally. Grossly normal perfusion all extremities.  Respiratory. Respiratory crackles. Poor air movement, some wheezing noted. No rhonchi. Moderate increased respiratory effort.  Abdomen soft nontender nondistended.  Psychiatric. Cannot assess.  Data Reviewed: ABG    Component Value Date/Time   PHART 7.378 08/01/2013 0353   PCO2ART 76.2* 08/01/2013 0353   PO2ART 86.3 08/01/2013 0353   HCO3 43.8* 08/01/2013 0353   TCO2 40.8 08/01/2013 0353   O2SAT 96.7 08/01/2013 0353    Urine output 1100. Oral intake 540.  Basic metabolic panel with chronic hypercarbia. Otherwise unremarkable.  CBC with stable anemia.  Chest x-ray with chronic changes but no acute abnormalities.   Scheduled Meds: . antiseptic oral rinse  15 mL Mouth Rinse BID  . aspirin  325 mg Oral Daily  . azithromycin  250 mg Intravenous Q24H  . cefTRIAXone (ROCEPHIN)  IV  1 g Intravenous Q24H  . cholecalciferol  1,000 Units Oral Daily  . citalopram  10 mg Oral Daily  . folic acid  1 mg Oral Daily  . furosemide  20 mg Oral Daily  . heparin  5,000 Units Subcutaneous 3 times per day  . ipratropium-albuterol  3 mL Nebulization Q4H  . loratadine  10 mg Oral Daily  . methylPREDNISolone (SOLU-MEDROL) injection  60 mg Intravenous Q6H  . multivitamin with minerals  1 tablet Oral Daily  . senna  1 tablet Oral BID  . sodium chloride  3 mL Intravenous Q12H  . thiamine  100 mg Oral Daily   Continuous Infusions:   Principal Problem:   Acute respiratory failure with hypercapnia Active Problems:   COPD   Hyperlipidemia   HTN (hypertension)   COPD exacerbation   Community acquired pneumonia   Encephalopathy   FTT (failure  to thrive) in adult   Time spent 25 minutes

## 2013-08-01 NOTE — Progress Notes (Signed)
CRITICAL VALUE ALERT  Critical value received:  CO2 42  Date of notification:  08/01/13  Time of notification:  0657  Critical value read back: yes  Nurse who received alert:  Jonetta Osgood RN  MD notified (1st page):  Dr. Reino Bellis  Time of first page:  [phone]  MD notified (2nd page):  Time of second page:  Responding MD: Dr. Reino Bellis  Time MD responded:  [phone]  Previous PCO2 76.2 already called about from ABG

## 2013-08-01 NOTE — Progress Notes (Signed)
Patient working to breathe throughout the night.  Her RR ranged from 25 to as high as 43.  Patient did have episodes of tachycardia as high as 143-150 at times.  On-call MD made aware of her HR and SBP elevation with some prn medications ordered.  Patient is off her scheduled lopressor 50 mg from her PTA med list at this time.  Patient had increased confusion and needed assistance reorienting throughout the shift.

## 2013-08-01 NOTE — Progress Notes (Signed)
Gave PT two nebs back to back, suspect her PCO2 is elevated, she is confused, talking out of her head. Breath sounds decreased with wheezes. Will continue to monitor.

## 2013-08-02 DIAGNOSIS — J449 Chronic obstructive pulmonary disease, unspecified: Secondary | ICD-10-CM

## 2013-08-02 DIAGNOSIS — I519 Heart disease, unspecified: Secondary | ICD-10-CM

## 2013-08-02 LAB — GLUCOSE, CAPILLARY
GLUCOSE-CAPILLARY: 137 mg/dL — AB (ref 70–99)
Glucose-Capillary: 189 mg/dL — ABNORMAL HIGH (ref 70–99)
Glucose-Capillary: 154 mg/dL — ABNORMAL HIGH (ref 70–99)
Glucose-Capillary: 204 mg/dL — ABNORMAL HIGH (ref 70–99)

## 2013-08-02 LAB — BASIC METABOLIC PANEL
BUN: 27 mg/dL — AB (ref 6–23)
CHLORIDE: 89 meq/L — AB (ref 96–112)
CO2: 44 mEq/L (ref 19–32)
Calcium: 9.5 mg/dL (ref 8.4–10.5)
Creatinine, Ser: 0.89 mg/dL (ref 0.50–1.10)
GFR, EST AFRICAN AMERICAN: 75 mL/min — AB (ref >90)
GFR, EST NON AFRICAN AMERICAN: 65 mL/min — AB (ref >90)
Glucose, Bld: 175 mg/dL — ABNORMAL HIGH (ref 70–99)
Potassium: 4.1 mEq/L (ref 3.7–5.3)
Sodium: 142 mEq/L (ref 137–147)

## 2013-08-02 MED ORDER — IPRATROPIUM-ALBUTEROL 0.5-2.5 (3) MG/3ML IN SOLN
3.0000 mL | Freq: Four times a day (QID) | RESPIRATORY_TRACT | Status: DC
  Administered 2013-08-02 – 2013-08-04 (×9): 3 mL via RESPIRATORY_TRACT
  Filled 2013-08-02 (×9): qty 3

## 2013-08-02 MED ORDER — POLYETHYLENE GLYCOL 3350 17 G PO PACK
17.0000 g | PACK | Freq: Two times a day (BID) | ORAL | Status: DC
  Administered 2013-08-02 – 2013-08-04 (×6): 17 g via ORAL
  Filled 2013-08-02 (×6): qty 1

## 2013-08-02 MED ORDER — DILTIAZEM HCL 25 MG/5ML IV SOLN
5.0000 mg | Freq: Once | INTRAVENOUS | Status: AC | PRN
  Administered 2013-08-02: 5 mg via INTRAVENOUS
  Filled 2013-08-02: qty 5

## 2013-08-02 MED ORDER — DOCUSATE SODIUM 100 MG PO CAPS
100.0000 mg | ORAL_CAPSULE | Freq: Two times a day (BID) | ORAL | Status: DC
  Administered 2013-08-02 – 2013-08-04 (×6): 100 mg via ORAL
  Filled 2013-08-02 (×6): qty 1

## 2013-08-02 NOTE — Progress Notes (Signed)
PROGRESS NOTE  [name] ION:[id number] DOB: 13-Mar-1946 DOA: 07/29/2013 PCP: Redge Gainer, MD  Summary: 68 year old woman with history of oxygen-dependent COPD presents the emergency department after being found to have acute on chronic hypoxia and her primary care physician's office. Daughter reported several week history of failure to thrive, confusion, stumbling, gradually worsening hand tremors. Initial evaluation suggested community acquired pneumonia, COPD exacerbation  Assessment/Plan: 1. Acute on chronic hypoxic respiratory failure with acute respiratory acidosis and hypercapnia. Difficult to manage with labile mental status however the patient has not become agreeable to BiPAP. Seems to improve throughout the day and relapsed at night. Hopefully will tolerate BiPAP each night. 2. COPD exacerbation. Continue current management. This appears be a primary issue. 3. Acute encephalopathy secondary to respiratory failure. Improves throughout the course of the day, relapses at night. 4. Community acquired pneumonia. Appears stable. No evidence of infiltrate on chest x-ray. Suspect clinically resolved. 5. Chronic hypoxic, hypercarbic respiratory failure, COPD, pulmonary fibrosis.  6. Failure to thrive, subacute encephalopathy for the last 3 weeks. Family described repeating questions, memory loss, stumbling, gradually worsening hand tremors for the last month. Celexa added late March for depression-like symptoms--suspect causing tremors, extrapyramidal-like symptoms, asthenia and FTT. Also consider alcohol affect, Xanax as contributing and family deny significant alcohol intake. Celexa discontinued. 7. Tremor. As above. Suspect related to medications given history.  8. Steroid-induced hyperglycemia. Hemoglobin A1c normal. Stable. 9. Macrocytic anemia. Stable. Etiology unclear, no evidence of bleeding at this time. Followup as an outpatient. 10. History of remote alcohol abuse. No more than  2 drinks per day per family. 11. History of psoriasis, on Humira. 12. History of tobacco dependence in remission   Overall perhaps somewhat improved. Hopefully can stabilize him BiPAP each night if the patient will tolerate. No indication for intubation at this time. Plan continue treatments for COPD and possible pneumonia.   Condition remains critical although she has improved. Prognosis is guarded.   bowel regimen.  Code Status: full code DVT prophylaxis: heparin Family Communication: none present Disposition Plan: home when improved  [name], MD  [facility]  Pager [phone] If 7PM-7AM, please contact night-coverage at [email or web address], password [facility] 08/02/2013, 11:43 AM  LOS: 4 days   Consultants:    Procedures:    Antibiotics:  Azithromycin 5/14 >>   Ceftriaxone 5/14 >>   HPI/Subjective: Seen again yesterday evening at which time she was sitting up, alert and eating dinner. Granddaughter is at bedside at that time.  Confused again last night, tachycardic again last night and hypoxic. The patient did comply with BiPAP.  Objective: Filed Vitals:   08/02/13 1000 08/02/13 1030 08/02/13 1100 08/02/13 1123  BP: 166/148 172/95 141/86   Pulse: 104 115    Temp:      TempSrc:      Resp: 27 26 30    Height:      Weight:      SpO2: 100% 97%  97%    Intake/Output Summary (Last 24 hours) at 08/02/13 1143 Last data filed at 08/02/13 1100  Gross per 24 hour  Intake    455 ml  Output   1500 ml  Net  -1045 ml     Filed Weights   07/31/13 0500 08/01/13 0500 08/02/13 0500  Weight: 55.6 kg (122 lb 9.2 oz) 54.3 kg (119 lb 11.4 oz) 55.9 kg (123 lb 3.8 oz)    Exam:   Afebrile, vital signs stable, hypoxia stable. Remains tachypneic.  Gen. Appears calm, comfortable on BiPAP. Follows commands.  Eyes pupils equal, round, react to light. Normal lids, irises.  ENT appears grossly unremarkable.  Cardiovascular sinus rhythm on telemetry. Regular rate and  rhythm, no murmur, rub or gallop. No lower extremity edema.  Respiratory fair air movement, no rhonchi, wheezes or rales. Mild increased respiratory effort.  Abdomen soft, nontender, nondistended.  Psychiatric. Difficult to assess but alert.  Musculoskeletal. Moves all cavities to command. Feet are warm and dry with palpablepulses. Perfusion appears grossly intact.  Data Reviewed:  Last bowel movement 5/16.  Excellent urine output.  Capillary blood sugars stable.  Basic metabolic panel without significant change. Normal kidney function.  Scheduled Meds: . antiseptic oral rinse  15 mL Mouth Rinse BID  . aspirin  325 mg Oral Daily  . azithromycin  250 mg Intravenous Q24H  . cefTRIAXone (ROCEPHIN)  IV  1 g Intravenous Q24H  . cholecalciferol  1,000 Units Oral Daily  . diltiazem  60 mg Oral 4 times per day  . folic acid  1 mg Oral Daily  . furosemide  20 mg Oral Daily  . heparin  5,000 Units Subcutaneous 3 times per day  . insulin aspart  0-9 Units Subcutaneous TID WC  . ipratropium-albuterol  3 mL Nebulization Q4H  . loratadine  10 mg Oral Daily  . methylPREDNISolone (SOLU-MEDROL) injection  60 mg Intravenous Q6H  . multivitamin with minerals  1 tablet Oral Daily  . senna  1 tablet Oral BID  . sodium chloride  3 mL Intravenous Q12H  . thiamine  100 mg Oral Daily   Continuous Infusions:   Principal Problem:   Acute respiratory failure with hypercapnia Active Problems:   COPD   Hyperlipidemia   HTN (hypertension)   COPD exacerbation   Community acquired pneumonia   Encephalopathy   FTT (failure to thrive) in adult   Time spent 30 minutes

## 2013-08-02 NOTE — Progress Notes (Signed)
PT appears very sleepy , confused reaching . Coughing up yellow thick sputum.

## 2013-08-02 NOTE — Progress Notes (Addendum)
PT on BiPAP 14/6 f 15 1lpm/[place] bled in.  PT asleep when BiPAP placed.

## 2013-08-02 NOTE — Progress Notes (Signed)
*  PRELIMINARY RESULTS* Echocardiogram 2D Echocardiogram has been performed.  [name] 08/02/2013, 1:00 PM

## 2013-08-02 NOTE — Progress Notes (Signed)
Subjective: She did agree to wear her BiPAP. She seems a little more alert this morning. She is still very sleepy.  Objective: Vital signs in last 24 hours: Temp:  [97.4 F (36.3 C)-99.1 F (37.3 C)] 97.9 F (36.6 C) (05/18 0800) Pulse Rate:  [84-141] 89 (05/18 0727) Resp:  [21-39] 26 (05/18 0727) BP: (119-192)/(45-123) 143/52 mmHg (05/18 0700) SpO2:  [86 %-100 %] 96 % (05/18 0727) Weight:  [55.9 kg (123 lb 3.8 oz)] 55.9 kg (123 lb 3.8 oz) (05/18 0500) Weight change: 1.6 kg (3 lb 8.4 oz) Last BM Date: 07/29/13  Intake/Output from previous day: 05/17 0701 - 05/18 0700 In: 415 [P.O.:240; IV Piggyback:175] Out: 1650 [Urine:1650]  PHYSICAL EXAM General appearance: Very sleepy. She is arousable more than yesterday morning Resp: rhonchi bilaterally Cardio: regular rate and rhythm, S1, S2 normal, no murmur, click, rub or gallop GI: soft, non-tender; bowel sounds normal; no masses,  no organomegaly Extremities: extremities normal, atraumatic, no cyanosis or edema  Lab Results:    Basic Metabolic Panel:  Recent Labs  08/01/13 0502 08/02/13 0444  NA 141 142  K 4.0 4.1  CL 89* 89*  CO2 42* 44*  GLUCOSE 159* 175*  BUN 23 27*  CREATININE 0.75 0.89  CALCIUM 10.0 9.5   Liver Function Tests: No results found for this basename: AST, ALT, ALKPHOS, BILITOT, PROT, ALBUMIN,  in the last 72 hours No results found for this basename: LIPASE, AMYLASE,  in the last 72 hours No results found for this basename: AMMONIA,  in the last 72 hours CBC:  Recent Labs  07/31/13 0508 08/01/13 0502  WBC 4.3 5.7  HGB 9.9* 10.0*  HCT 31.0* 32.5*  MCV 103.0* 105.5*  PLT 204 230   Cardiac Enzymes: No results found for this basename: CKTOTAL, CKMB, CKMBINDEX, TROPONINI,  in the last 72 hours BNP: No results found for this basename: PROBNP,  in the last 72 hours D-Dimer: No results found for this basename: DDIMER,  in the last 72 hours CBG:  Recent Labs  07/31/13 1717 07/31/13 2104  08/01/13 0727 08/01/13 1141 08/01/13 1622 08/01/13 2123  GLUCAP 147* 191* 163* 177* 141* 153*   Hemoglobin A1C:  Recent Labs  07/31/13 0508  HGBA1C 5.5   Fasting Lipid Panel: No results found for this basename: CHOL, HDL, LDLCALC, TRIG, CHOLHDL, LDLDIRECT,  in the last 72 hours Thyroid Function Tests: No results found for this basename: TSH, T4TOTAL, FREET4, T3FREE, THYROIDAB,  in the last 72 hours Anemia Panel: No results found for this basename: VITAMINB12, FOLATE, FERRITIN, TIBC, IRON, RETICCTPCT,  in the last 72 hours Coagulation: No results found for this basename: LABPROT, INR,  in the last 72 hours Urine Drug Screen: Drugs of Abuse  No results found for this basename: labopia, cocainscrnur, labbenz, amphetmu, thcu, labbarb    Alcohol Level: No results found for this basename: ETH,  in the last 72 hours Urinalysis: No results found for this basename: COLORURINE, APPERANCEUR, LABSPEC, PHURINE, GLUCOSEU, HGBUR, BILIRUBINUR, KETONESUR, PROTEINUR, UROBILINOGEN, NITRITE, LEUKOCYTESUR,  in the last 72 hours Misc. Labs:  ABGS  Recent Labs  08/01/13 0353  PHART 7.378  PO2ART 86.3  TCO2 40.8  HCO3 43.8*   CULTURES Recent Results (from the past 240 hour(s))  MRSA PCR SCREENING     Status: None   Collection Time    07/30/13  9:16 PM      Result Value Ref Range Status   MRSA by PCR NEGATIVE  NEGATIVE Final   Comment:  The GeneXpert MRSA Assay (FDA     approved for NASAL specimens     only), is one component of a     comprehensive MRSA colonization     surveillance program. It is not     intended to diagnose MRSA     infection nor to guide or     monitor treatment for     MRSA infections.   Studies/Results: Dg Chest Port 1 View  08/01/2013   CLINICAL DATA:  Respiratory failure.  EXAM: PORTABLE CHEST - 1 VIEW  COMPARISON:  07/29/2013.  FINDINGS: The heart remains normal in size. Stable prominence of the interstitial markings. Cervical spine degenerative  changes. Right rotator cuff calcific tendinitis.  IMPRESSION: Stable chronic interstitial lung disease.  No acute abnormality.   Electronically Signed   By: Enrique Sack M.D.   On: 08/01/2013 05:58    Medications:  Prior to Admission:  Prescriptions prior to admission  Medication Sig Dispense Refill  . adalimumab (HUMIRA PEN) 40 MG/0.8ML injection Inject 40 mg into the skin every 14 (fourteen) days.       Marland Kitchen albuterol (PROAIR HFA) 108 (90 BASE) MCG/ACT inhaler Inhale 2 puffs into the lungs every 6 (six) hours as needed for wheezing or shortness of breath.  3 Inhaler  1  . albuterol (PROVENTIL) (2.5 MG/3ML) 0.083% nebulizer solution Take 3 mLs (2.5 mg total) by nebulization every 6 (six) hours as needed for wheezing or shortness of breath.  150 mL  1  . ALPRAZolam (XANAX) 0.5 MG tablet Take 1 tablet (0.5 mg total) by mouth 2 (two) times daily.  180 tablet  0  . aspirin 325 MG tablet Take 325 mg by mouth daily.        . cetirizine (ALL DAY ALLERGY) 10 MG tablet Take 10 mg by mouth daily.      . cholecalciferol (VITAMIN D) 1000 UNITS tablet Take 1,000 Units by mouth daily.      . citalopram (CELEXA) 20 MG tablet Take 1 tablet (20 mg total) by mouth daily.  30 tablet  3  . EQL CALCIUM CITRATE/VITAMIN D PO Take by mouth. Vitamin D 500IU with Calcium Citrate 630mg       . furosemide (LASIX) 20 MG tablet Take 1 tablet (20 mg total) by mouth daily.  30 tablet  3  . guaifenesin (MUCUS RELIEF) 400 MG TABS tablet Take 400 mg by mouth daily as needed (for ocngestion).      Marland Kitchen HYDROcodone-acetaminophen (NORCO) 10-325 MG per tablet Take 1 tablet by mouth every 8 (eight) hours.      . metoprolol (LOPRESSOR) 50 MG tablet Take 1 tablet (50 mg total) by mouth 2 (two) times daily.  180 tablet  3  . tiotropium (SPIRIVA) 18 MCG inhalation capsule Place 18 mcg into inhaler and inhale every morning.        Scheduled: . antiseptic oral rinse  15 mL Mouth Rinse BID  . aspirin  325 mg Oral Daily  . azithromycin  250 mg  Intravenous Q24H  . cefTRIAXone (ROCEPHIN)  IV  1 g Intravenous Q24H  . cholecalciferol  1,000 Units Oral Daily  . diltiazem  60 mg Oral 4 times per day  . folic acid  1 mg Oral Daily  . furosemide  20 mg Oral Daily  . heparin  5,000 Units Subcutaneous 3 times per day  . insulin aspart  0-9 Units Subcutaneous TID WC  . ipratropium-albuterol  3 mL Nebulization Q4H  . loratadine  10 mg  Oral Daily  . methylPREDNISolone (SOLU-MEDROL) injection  60 mg Intravenous Q6H  . multivitamin with minerals  1 tablet Oral Daily  . senna  1 tablet Oral BID  . sodium chloride  3 mL Intravenous Q12H  . thiamine  100 mg Oral Daily   Continuous:  OQH:UTMLYY chloride, acetaminophen, acetaminophen, alum & mag hydroxide-simeth, amitriptyline, bisacodyl, guaiFENesin, hydrALAZINE, LORazepam, LORazepam, ondansetron (ZOFRAN) IV, ondansetron, sodium chloride  Assesment: She was admitted with acute hypercapnic respiratory failure. She is improving slowly. She did wear her BiPAP. She has severe COPD and has community-acquired pneumonia which I think is improving Principal Problem:   Acute respiratory failure with hypercapnia Active Problems:   COPD   Hyperlipidemia   HTN (hypertension)   COPD exacerbation   Community acquired pneumonia   Encephalopathy   FTT (failure to thrive) in adult    Plan: No change in treatments. She becomes much more alert as the day goes on and I think this may be her baseline    LOS: 4 days   Alonza Bogus 08/02/2013, 8:17 AM

## 2013-08-02 NOTE — Progress Notes (Signed)
UR chart review completed.  

## 2013-08-02 NOTE — Progress Notes (Signed)
Patient states that she does not want to wear BIPAP tonight.  RT coordinating with RN throughout the night to see if patient will have to go on when she goes to sleep.

## 2013-08-02 NOTE — Clinical Social Work Psychosocial (Signed)
    Clinical Social Work Department BRIEF PSYCHOSOCIAL ASSESSMENT 08/02/2013  Patient:  [name], [name]     Account Number:  [id number]     Admit date:  07/29/2013  Clinical Social Worker:  Edwyna Shell, [place]  Date/Time:  08/02/2013 10:00 AM  Referred by:  CSW  Date Referred:  08/02/2013 Referred for  SNF Placement   Other Referral:   Interview type:  Family Other interview type:   Spoke briefly w patient, patient had difficulty speaking and agreed that CSW could talk w husband and daughter    PSYCHOSOCIAL DATA Living Status:  HUSBAND Admitted from facility:   Level of care:   Primary support name:  [name] Primary support relationship to patient:  SPOUSE Degree of support available:   Supportive spouse, retired, states he is willing and able to help w any physical care needs required post discharge.    CURRENT CONCERNS Current Concerns  Post-Acute Placement   Other Concerns:    SOCIAL WORK ASSESSMENT / PLAN CSW attempted to speak w patient, patient had difficulty speaking and is confused per husband - "I cannot make sense of anything she says."  Patient agreed for CSW to speak w husband and daughter.  CSW spoke w husband by phone, says he and wife live together and manage well.  Says patient has been able to walk without assistance, "she has two walkers and doesnt use either of them."  Patient is able to fix food for herself, dress, toilet independently.  Husband says patient "will probably want to come home when she is discharged", says he is retired and can assist w "whatever she needs."  Relates that he has had two knee replacments and patient had hip replacement - in both instances, spouses have come home at discharge and rehabbed w home health PT.  Says "she will probably just use her unicycle (stationary bicycle) more" in order to rehab.    CSW explained process of placement to husband, he declined to have process initiated as he believes he  can assist patient adequately and patient will not want to go to SNF. RN CM informed of family choice.  CSW left SNF list in room, ask husband to let RN know if they change mind and decide SNF rehab would be a choice.  At present, CSW will not seek SNF bed for patient as family is declining on behalf of patient.   Assessment/plan status:  No Further Intervention Required Other assessment/ plan:   Information/referral to community resources:   SNF list    PATIENT'S/FAMILY'S RESPONSE TO PLAN OF CARE: Per husband, patient would not agree to SNF placement and wants to return home, she "begged me to take her home yesterday."  Husband feels confident he can meet wife's care needs at home.        Edwyna Shell, LCSW Clinical Social Worker [phone])

## 2013-08-02 NOTE — Progress Notes (Addendum)
Patient was tachycardic and elevated BP prior to bed time.  Patient working hard to breathe.  Patient received her PRN sleep medication and Hydralazine 5 mg IV to assist.  Her RR and BP decreased but still sustaining tachycardia of 130's to 140's after receiving scheduled PO Cardizem 60 mg.  Called MD for extra dose of IV Cardizem of 5 mg which has helped with her HR 100's to 110's.  Patient is currently Oxygen STATS 87-88% on 4L which has been discussed with RT department at this time.  May try Bipap prior to patient waking up per RT

## 2013-08-03 LAB — BASIC METABOLIC PANEL
BUN: 26 mg/dL — ABNORMAL HIGH (ref 6–23)
CREATININE: 0.78 mg/dL (ref 0.50–1.10)
Calcium: 9.3 mg/dL (ref 8.4–10.5)
Chloride: 90 mEq/L — ABNORMAL LOW (ref 96–112)
GFR calc non Af Amer: 83 mL/min — ABNORMAL LOW (ref >90)
Glucose, Bld: 160 mg/dL — ABNORMAL HIGH (ref 70–99)
Potassium: 3.9 mEq/L (ref 3.7–5.3)
Sodium: 142 mEq/L (ref 137–147)

## 2013-08-03 LAB — CBC
HEMATOCRIT: 32.3 % — AB (ref 36.0–46.0)
Hemoglobin: 9.9 g/dL — ABNORMAL LOW (ref 12.0–15.0)
MCH: 32.4 pg (ref 26.0–34.0)
MCHC: 30.7 g/dL (ref 30.0–36.0)
MCV: 105.6 fL — ABNORMAL HIGH (ref 78.0–100.0)
Platelets: 262 10*3/uL (ref 150–400)
RBC: 3.06 MIL/uL — AB (ref 3.87–5.11)
RDW: 12.9 % (ref 11.5–15.5)
WBC: 9.2 10*3/uL (ref 4.0–10.5)

## 2013-08-03 LAB — GLUCOSE, CAPILLARY
GLUCOSE-CAPILLARY: 174 mg/dL — AB (ref 70–99)
GLUCOSE-CAPILLARY: 152 mg/dL — AB (ref 70–99)
Glucose-Capillary: 170 mg/dL — ABNORMAL HIGH (ref 70–99)
Glucose-Capillary: 237 mg/dL — ABNORMAL HIGH (ref 70–99)

## 2013-08-03 LAB — VITAMIN B12: Vitamin B-12: 1102 pg/mL — ABNORMAL HIGH (ref 211–911)

## 2013-08-03 MED ORDER — METHYLPREDNISOLONE SODIUM SUCC 125 MG IJ SOLR
60.0000 mg | Freq: Two times a day (BID) | INTRAMUSCULAR | Status: DC
  Administered 2013-08-03 – 2013-08-05 (×4): 60 mg via INTRAVENOUS
  Filled 2013-08-03 (×4): qty 2

## 2013-08-03 MED ORDER — AMITRIPTYLINE HCL 25 MG PO TABS
25.0000 mg | ORAL_TABLET | Freq: Every evening | ORAL | Status: DC | PRN
  Administered 2013-08-04: 25 mg via ORAL
  Filled 2013-08-03: qty 1

## 2013-08-03 NOTE — Progress Notes (Addendum)
Physical Therapy Treatment Patient Details Name: [name] MRN: [id number] DOB: 08/02/1945 Today's Date: 08/03/2013    History of Present Illness 68 year old woman with history of oxygen-dependent COPD presents the emergency department after being found to have acute on chronic hypoxia and her primary care physician's office. Daughter reported several week history of failure to thrive, confusion, stumbling, gradually worsening hand tremors. Initial evaluation suggested community acquired pneumonia, COPD exacerbation    PT Comments    Pt made good progress today with increased arousal, increased activity level, and improved mobility. Pt will continue to benefit from acute PT to maximize mobility and Independence for the next venue of care.  Follow Up Recommendations  SNF     Equipment Recommendations       Recommendations for Other Services       Precautions / Restrictions Precautions Precautions: Fall Precaution Comments: generalized weakness and tremoring Restrictions Weight Bearing Restrictions: No    Mobility  Bed Mobility Overal bed mobility: Needs Assistance Bed Mobility: Supine to Sit;Sit to Supine Rolling: Min guard   Supine to sit: Min guard Sit to supine: Min guard   General bed mobility comments: cues for technique, use of bed rails  Transfers Overall transfer level: Needs assistance Equipment used: Rolling walker (2 wheeled) Transfers: Sit to/from Stand Sit to Stand: Min assist         General transfer comment: verbal cues to increase forward trunk flexion during initial rise up to stand. Cues for hand placement.  Ambulation/Gait Ambulation/Gait assistance: Min assist Ambulation Distance (Feet): 15 Feet (2 trials) Assistive device: Rolling walker (2 wheeled) Gait Pattern/deviations: Shuffle;Decreased stride length   Gait velocity interpretation: Below normal speed for age/gender General Gait Details: cues for sequencing, min assist to guide  RW at times to avoid obsticles   Stairs            Wheelchair Mobility    Modified Rankin (Stroke Patients Only)       Balance Overall balance assessment: Needs assistance Sitting-balance support: No upper extremity supported Sitting balance-Leahy Scale: Fair     Standing balance support: Bilateral upper extremity supported Standing balance-Leahy Scale: Poor                      Cognition Arousal/Alertness: Awake/alert Behavior During Therapy: WFL for tasks assessed/performed Overall Cognitive Status: No family/caregiver present to determine baseline cognitive functioning                      Exercises Total Joint Exercises Heel Slides: AROM;Strengthening;Both;10 reps;Supine Hip ABduction/ADduction: AROM;Strengthening;Both;10 reps;Supine Straight Leg Raises: AROM;Strengthening;Both;10 reps;Supine Long Arc Quad: AROM;Strengthening;Both;10 reps;Seated    General Comments        Pertinent Vitals/Pain O2 sats 88% after gait on 3 liters O2, wheezing noted    Home Living                      Prior Function            PT Goals (current goals can now be found in the care plan section) Progress towards PT goals: Goals met and updated - see care plan    Frequency       PT Plan Current plan remains appropriate    Co-evaluation     PT goals addressed during session: Mobility/safety with mobility;Balance;Proper use of DME;Strengthening/ROM       End of Session Equipment Utilized During Treatment: Gait belt;Oxygen Activity Tolerance: Patient limited by fatigue Patient left: in  bed;with call bell/phone within reach;with bed alarm set     Time: 1155-1221 PT Time Calculation (min): 26 min  Charges:  $Gait Training: 8-22 mins $Therapeutic Exercise: 8-22 mins                    G Codes:       Kerstine  08/03/2013, 12:34 PM   

## 2013-08-03 NOTE — Progress Notes (Signed)
Lab called with critical CO2 value of >45 at 0645. MD notified at [phone]. No new orders received; will continue to monitor.

## 2013-08-03 NOTE — Progress Notes (Signed)
Progress Note  [name]:[id number] DOB: 1945-08-06 DOA: 07/29/2013 PCP: Redge Gainer, MD  Admit HPI / Brief Narrative: 68 year old woman with history of oxygen-dependent COPD who presented the emergency department after being found to have acute on chronic hypoxia at her primary care physician's office. Daughter reported several week history of failure to thrive, confusion, stumbling, gradually worsening hand tremors. Initial evaluation suggested community acquired pneumonia with acute COPD exacerbation.  HPI/Subjective: Pt is alert but confused.  She refused BIPAP last night.  She "just wants to get some sunshine."  Unable to provide a reliable hx.   Assessment/Plan:  Acute on chronic hypoxic respiratory failure with acute respiratory acidosis and hypercapnia Difficult to manage with labile mental status - seems to improve throughout the day and relapses at night, coincident with her refusal to wear BIPAP - not sure we have much more to add - will cont to monitor in SDU due to high likelihood or requiring intermittent rescue BIPAP during day   COPD exacerbation Wheezing appears to have subsided significantly - rapidly taper steroids in hopes of improving mental status  Acute encephalopathy secondary to respiratory failure. improves throughout the course of the day, relapses at night, likely due to hypercarbia in setting of BIPAP refusal - attempt to avoid sedatives   Community acquired pneumonia Appears stable - complete empiric course of abx tx   Chronic hypoxic, hypercarbic respiratory failure, COPD, pulmonary fibrosis  Failure to thrive, subacute encephalopathy for the last 3 weeks Family described repeating questions, memory loss, stumbling, gradually worsening hand tremors for the last month. Celexa added late March for depression-like symptoms--suspect causing tremors, extrapyramidal-like symptoms, asthenia and FTT. Also consider alcohol affect, Xanax as contributing  and family deny significant alcohol intake. Celexa discontinued.    Tremor As above. Suspect related to medications given history.   Steroid-induced hyperglycemia Hemoglobin A1c normal. Stable.  Macrocytic anemia Stable. Etiology unclear, no evidence of bleeding at this time. Check B12 and folate.    History of remote alcohol abuse No more than 2 drinks per day per family.  History of psoriasis on Humira.  History of tobacco dependenc in remission  Code Status: FULL Family Communication: no family present at time of exam Disposition Plan: SDU  Consultants: Pulmonary   Procedures: TTE - 5/18 - EF 55-60% - unable comment WMA - grade 1 DD  Antibiotics: azithro 5/14 >>5/18 Rocephin 5/14 >>  DVT prophylaxis: SQ heparin  Objective: Blood pressure 143/63, pulse 93, temperature 97.7 F (36.5 C), temperature source Axillary, resp. rate 23, height 4\' 5"  (1.346 m), weight 55.5 kg (122 lb 5.7 oz), SpO2 96.00%.  Intake/Output Summary (Last 24 hours) at 08/03/13 0956 Last data filed at 08/03/13 0600  Gross per 24 hour  Intake   1035 ml  Output    550 ml  Net    485 ml   Exam: General: No acute respiratory distress in bed - confused but not agitated  Lungs: poor air movement th/o all fields - no active wheeze - no focal crackles  Cardiovascular: Regular rate and rhythm without murmur gallop or rub normal S1 and S2 - distant HS Abdomen: Nontender, nondistended, soft, bowel sounds positive, no rebound, no ascites, no appreciable mass Extremities: No significant cyanosis, clubbing, or edema bilateral lower extremities  Data Reviewed: Basic Metabolic Panel:  Recent Labs Lab 07/29/13 1734 07/30/13 0537 08/01/13 0502 08/02/13 0444 08/03/13 0426  NA 137 136* 141 142 142  K 3.5* 4.5 4.0 4.1 3.9  CL 84* 88* 89*  89* 90*  CO2 44* 41* 42* 44* >45*  GLUCOSE 129* 169* 159* 175* 160*  BUN 19 22 23  27* 26*  CREATININE 0.79 0.92 0.75 0.89 0.78  CALCIUM 10.8* 10.1 10.0 9.5 9.3     Liver Function Tests:  Recent Labs Lab 07/29/13 1734 07/30/13 0537  AST 28 28  ALT 16 18  ALKPHOS 96 97  BILITOT 0.6 0.3  PROT 7.9 7.3  ALBUMIN 3.2* 2.8*   CBC:  Recent Labs Lab 07/29/13 1734 07/30/13 0537 07/31/13 0508 08/01/13 0502 08/03/13 0426  WBC 3.1* 3.3* 4.3 5.7 9.2  NEUTROABS 1.9  --   --   --   --   HGB 10.3* 9.6* 9.9* 10.0* 9.9*  HCT 32.5* 29.8* 31.0* 32.5* 32.3*  MCV 101.9* 102.4* 103.0* 105.5* 105.6*  PLT 168 170 204 230 262   Cardiac Enzymes:  Recent Labs Lab 07/29/13 1734  TROPONINI <0.30   BNP (last 3 results)  Recent Labs  07/29/13 1734  PROBNP 3332.0*   CBG:  Recent Labs Lab 08/02/13 0742 08/02/13 1152 08/02/13 1624 08/02/13 2141 08/03/13 0739  GLUCAP 189* 154* 137* 204* 174*    Recent Results (from the past 240 hour(s))  MRSA PCR SCREENING     Status: None   Collection Time    07/30/13  9:16 PM      Result Value Ref Range Status   MRSA by PCR NEGATIVE  NEGATIVE Final   Comment:            The GeneXpert MRSA Assay (FDA     approved for NASAL specimens     only), is one component of a     comprehensive MRSA colonization     surveillance program. It is not     intended to diagnose MRSA     infection nor to guide or     monitor treatment for     MRSA infections.     Studies:  Recent x-ray studies have been reviewed in detail by the Attending Physician  Scheduled Meds:  Scheduled Meds: . antiseptic oral rinse  15 mL Mouth Rinse BID  . aspirin  325 mg Oral Daily  . azithromycin  250 mg Intravenous Q24H  . cefTRIAXone (ROCEPHIN)  IV  1 g Intravenous Q24H  . cholecalciferol  1,000 Units Oral Daily  . diltiazem  60 mg Oral 4 times per day  . docusate sodium  100 mg Oral BID  . folic acid  1 mg Oral Daily  . furosemide  20 mg Oral Daily  . heparin  5,000 Units Subcutaneous 3 times per day  . insulin aspart  0-9 Units Subcutaneous TID WC  . ipratropium-albuterol  3 mL Nebulization Q6H  . loratadine  10 mg Oral  Daily  . methylPREDNISolone (SOLU-MEDROL) injection  60 mg Intravenous Q6H  . multivitamin with minerals  1 tablet Oral Daily  . polyethylene glycol  17 g Oral BID  . senna  1 tablet Oral BID  . sodium chloride  3 mL Intravenous Q12H  . thiamine  100 mg Oral Daily    Time spent on care of this patient: 35 mins   Cherene Altes , MD   [facility] Office  [phone] Pager - Text Page per Shea Evans as per below:  On-Call/Text Page:      [email or web address]      password [email or web address]  If 7PM-7AM, please contact night-coverage [email or web address] Password [email or web address] 08/03/2013, 9:56 AM   LOS: 5 days

## 2013-08-03 NOTE — Progress Notes (Signed)
Subjective: She is much more alert this morning. She is generally very sleepy in the morning and then gets better as the day goes on. She says she feels better.  Objective: Vital signs in last 24 hours: Temp:  [97.8 F (36.6 C)-98.9 F (37.2 C)] 97.8 F (36.6 C) (05/19 0400) Pulse Rate:  [76-115] 93 (05/19 0600) Resp:  [19-30] 21 (05/19 0600) BP: (126-172)/(51-148) 149/51 mmHg (05/19 0600) SpO2:  [91 %-100 %] 96 % (05/19 0705) Weight:  [55.5 kg (122 lb 5.7 oz)] 55.5 kg (122 lb 5.7 oz) (05/19 0500) Weight change: -0.4 kg (-14.1 oz) Last BM Date: 07/29/13  Intake/Output from previous day: 05/18 0701 - 05/19 0700 In: 1055 [I.V.:230; IV Piggyback:125] Out: 550 [Urine:550]  PHYSICAL EXAM General appearance: alert, cooperative and mild distress Resp: diminished breath sounds bilaterally Cardio: regular rate and rhythm, S1, S2 normal, no murmur, click, rub or gallop GI: soft, non-tender; bowel sounds normal; no masses,  no organomegaly Extremities: extremities normal, atraumatic, no cyanosis or edema  Lab Results:    Basic Metabolic Panel:  Recent Labs  08/02/13 0444 08/03/13 0426  NA 142 142  K 4.1 3.9  CL 89* 90*  CO2 44* >45*  GLUCOSE 175* 160*  BUN 27* 26*  CREATININE 0.89 0.78  CALCIUM 9.5 9.3   Liver Function Tests: No results found for this basename: AST, ALT, ALKPHOS, BILITOT, PROT, ALBUMIN,  in the last 72 hours No results found for this basename: LIPASE, AMYLASE,  in the last 72 hours No results found for this basename: AMMONIA,  in the last 72 hours CBC:  Recent Labs  08/01/13 0502 08/03/13 0426  WBC 5.7 9.2  HGB 10.0* 9.9*  HCT 32.5* 32.3*  MCV 105.5* 105.6*  PLT 230 262   Cardiac Enzymes: No results found for this basename: CKTOTAL, CKMB, CKMBINDEX, TROPONINI,  in the last 72 hours BNP: No results found for this basename: PROBNP,  in the last 72 hours D-Dimer: No results found for this basename: DDIMER,  in the last 72 hours CBG:  Recent  Labs  08/01/13 2123 08/02/13 0742 08/02/13 1152 08/02/13 1624 08/02/13 2141 08/03/13 0739  GLUCAP 153* 189* 154* 137* 204* 174*   Hemoglobin A1C: No results found for this basename: HGBA1C,  in the last 72 hours Fasting Lipid Panel: No results found for this basename: CHOL, HDL, LDLCALC, TRIG, CHOLHDL, LDLDIRECT,  in the last 72 hours Thyroid Function Tests: No results found for this basename: TSH, T4TOTAL, FREET4, T3FREE, THYROIDAB,  in the last 72 hours Anemia Panel: No results found for this basename: VITAMINB12, FOLATE, FERRITIN, TIBC, IRON, RETICCTPCT,  in the last 72 hours Coagulation: No results found for this basename: LABPROT, INR,  in the last 72 hours Urine Drug Screen: Drugs of Abuse  No results found for this basename: labopia, cocainscrnur, labbenz, amphetmu, thcu, labbarb    Alcohol Level: No results found for this basename: ETH,  in the last 72 hours Urinalysis: No results found for this basename: COLORURINE, APPERANCEUR, LABSPEC, PHURINE, GLUCOSEU, HGBUR, BILIRUBINUR, KETONESUR, PROTEINUR, UROBILINOGEN, NITRITE, LEUKOCYTESUR,  in the last 72 hours Misc. Labs:  ABGS  Recent Labs  08/01/13 0353  PHART 7.378  PO2ART 86.3  TCO2 40.8  HCO3 43.8*   CULTURES Recent Results (from the past 240 hour(s))  MRSA PCR SCREENING     Status: None   Collection Time    07/30/13  9:16 PM      Result Value Ref Range Status   MRSA by PCR NEGATIVE  NEGATIVE Final   Comment:            The GeneXpert MRSA Assay (FDA     approved for NASAL specimens     only), is one component of a     comprehensive MRSA colonization     surveillance program. It is not     intended to diagnose MRSA     infection nor to guide or     monitor treatment for     MRSA infections.   Studies/Results: No results found.  Medications:  Prior to Admission:  Prescriptions prior to admission  Medication Sig Dispense Refill  . adalimumab (HUMIRA PEN) 40 MG/0.8ML injection Inject 40 mg into  the skin every 14 (fourteen) days.       Marland Kitchen albuterol (PROAIR HFA) 108 (90 BASE) MCG/ACT inhaler Inhale 2 puffs into the lungs every 6 (six) hours as needed for wheezing or shortness of breath.  3 Inhaler  1  . albuterol (PROVENTIL) (2.5 MG/3ML) 0.083% nebulizer solution Take 3 mLs (2.5 mg total) by nebulization every 6 (six) hours as needed for wheezing or shortness of breath.  150 mL  1  . ALPRAZolam (XANAX) 0.5 MG tablet Take 1 tablet (0.5 mg total) by mouth 2 (two) times daily.  180 tablet  0  . aspirin 325 MG tablet Take 325 mg by mouth daily.        . cetirizine (ALL DAY ALLERGY) 10 MG tablet Take 10 mg by mouth daily.      . cholecalciferol (VITAMIN D) 1000 UNITS tablet Take 1,000 Units by mouth daily.      . citalopram (CELEXA) 20 MG tablet Take 1 tablet (20 mg total) by mouth daily.  30 tablet  3  . EQL CALCIUM CITRATE/VITAMIN D PO Take by mouth. Vitamin D 500IU with Calcium Citrate 630mg       . furosemide (LASIX) 20 MG tablet Take 1 tablet (20 mg total) by mouth daily.  30 tablet  3  . guaifenesin (MUCUS RELIEF) 400 MG TABS tablet Take 400 mg by mouth daily as needed (for ocngestion).      Marland Kitchen HYDROcodone-acetaminophen (NORCO) 10-325 MG per tablet Take 1 tablet by mouth every 8 (eight) hours.      . metoprolol (LOPRESSOR) 50 MG tablet Take 1 tablet (50 mg total) by mouth 2 (two) times daily.  180 tablet  3  . tiotropium (SPIRIVA) 18 MCG inhalation capsule Place 18 mcg into inhaler and inhale every morning.        Scheduled: . antiseptic oral rinse  15 mL Mouth Rinse BID  . aspirin  325 mg Oral Daily  . azithromycin  250 mg Intravenous Q24H  . cefTRIAXone (ROCEPHIN)  IV  1 g Intravenous Q24H  . cholecalciferol  1,000 Units Oral Daily  . diltiazem  60 mg Oral 4 times per day  . docusate sodium  100 mg Oral BID  . folic acid  1 mg Oral Daily  . furosemide  20 mg Oral Daily  . heparin  5,000 Units Subcutaneous 3 times per day  . insulin aspart  0-9 Units Subcutaneous TID WC  .  ipratropium-albuterol  3 mL Nebulization Q6H  . loratadine  10 mg Oral Daily  . methylPREDNISolone (SOLU-MEDROL) injection  60 mg Intravenous Q6H  . multivitamin with minerals  1 tablet Oral Daily  . polyethylene glycol  17 g Oral BID  . senna  1 tablet Oral BID  . sodium chloride  3 mL Intravenous Q12H  .  thiamine  100 mg Oral Daily   Continuous:  RJJ:OACZYS chloride, acetaminophen, acetaminophen, alum & mag hydroxide-simeth, amitriptyline, bisacodyl, guaiFENesin, hydrALAZINE, ondansetron (ZOFRAN) IV, ondansetron, sodium chloride  Assesment: She was admitted with acute respiratory failure with markedly elevated PCO2. She has known COPD. She was found to have community-acquired pneumonia and she has been extremely encephalopathic probably from CO2 narcosis. She is much improved this morning. Her pneumonia seems to be clearing gradually. She still has some encephalopathy but it is much better and she's much less sleepy than she has been. Her PCO2 has been improving. Principal Problem:   Acute respiratory failure with hypercapnia Active Problems:   COPD   Hyperlipidemia   HTN (hypertension)   COPD exacerbation   Community acquired pneumonia   Encephalopathy   FTT (failure to thrive) in adult    Plan: Continue with current treatments.    LOS: 5 days   Alonza Bogus 08/03/2013, 8:25 AM

## 2013-08-04 LAB — GLUCOSE, CAPILLARY
GLUCOSE-CAPILLARY: 160 mg/dL — AB (ref 70–99)
Glucose-Capillary: 140 mg/dL — ABNORMAL HIGH (ref 70–99)
Glucose-Capillary: 147 mg/dL — ABNORMAL HIGH (ref 70–99)
Glucose-Capillary: 161 mg/dL — ABNORMAL HIGH (ref 70–99)

## 2013-08-04 LAB — FOLATE RBC: RBC Folate: 619 ng/mL — ABNORMAL HIGH (ref >280)

## 2013-08-04 MED ORDER — IPRATROPIUM-ALBUTEROL 0.5-2.5 (3) MG/3ML IN SOLN
3.0000 mL | Freq: Three times a day (TID) | RESPIRATORY_TRACT | Status: DC
  Administered 2013-08-05 (×2): 3 mL via RESPIRATORY_TRACT
  Filled 2013-08-04 (×2): qty 3

## 2013-08-04 NOTE — Clinical Social Work Note (Signed)
CSW reviewed chart, spoke w RN CM.  Patient and family want her to return home, [place] signing off as no SW needs identified.  Edwyna Shell, LCSW Clinical Social Worker [phone])

## 2013-08-04 NOTE — Progress Notes (Signed)
Patient refused to wear BIPAP machine again tonight. Hospital unit still at bedside at this time.

## 2013-08-04 NOTE — Progress Notes (Signed)
Subjective: She is a little more alert and responsive this morning. I am able to wake her up. She has no other new complaints.  Objective: Vital signs in last 24 hours: Temp:  [97.4 F (36.3 C)-98 F (36.7 C)] 97.4 F (36.3 C) (05/20 0400) Pulse Rate:  [30-87] 30 (05/20 0700) Resp:  [14-25] 14 (05/20 0700) BP: (107-161)/(50-96) 157/76 mmHg (05/20 0700) SpO2:  [83 %-100 %] 99 % (05/20 0703) Weight:  [55.6 kg (122 lb 9.2 oz)] 55.6 kg (122 lb 9.2 oz) (05/20 0500) Weight change: 0.1 kg (3.5 oz) Last BM Date: 08/03/13  Intake/Output from previous day: 05/19 0701 - 05/20 0700 In: 100 [I.V.:100] Out: 400 [Urine:400]  PHYSICAL EXAM General appearance: no distress Resp: clear to auscultation bilaterally Cardio: regular rate and rhythm, S1, S2 normal, no murmur, click, rub or gallop GI: soft, non-tender; bowel sounds normal; no masses,  no organomegaly Extremities: extremities normal, atraumatic, no cyanosis or edema  Lab Results:    Basic Metabolic Panel:  Recent Labs  08/02/13 0444 08/03/13 0426  NA 142 142  K 4.1 3.9  CL 89* 90*  CO2 44* >45*  GLUCOSE 175* 160*  BUN 27* 26*  CREATININE 0.89 0.78  CALCIUM 9.5 9.3   Liver Function Tests: No results found for this basename: AST, ALT, ALKPHOS, BILITOT, PROT, ALBUMIN,  in the last 72 hours No results found for this basename: LIPASE, AMYLASE,  in the last 72 hours No results found for this basename: AMMONIA,  in the last 72 hours CBC:  Recent Labs  08/03/13 0426  WBC 9.2  HGB 9.9*  HCT 32.3*  MCV 105.6*  PLT 262   Cardiac Enzymes: No results found for this basename: CKTOTAL, CKMB, CKMBINDEX, TROPONINI,  in the last 72 hours BNP: No results found for this basename: PROBNP,  in the last 72 hours D-Dimer: No results found for this basename: DDIMER,  in the last 72 hours CBG:  Recent Labs  08/02/13 2141 08/03/13 0739 08/03/13 1146 08/03/13 1646 08/03/13 2157 08/04/13 0740  GLUCAP 204* 174* 170* 237* 152*  160*   Hemoglobin A1C: No results found for this basename: HGBA1C,  in the last 72 hours Fasting Lipid Panel: No results found for this basename: CHOL, HDL, LDLCALC, TRIG, CHOLHDL, LDLDIRECT,  in the last 72 hours Thyroid Function Tests: No results found for this basename: TSH, T4TOTAL, FREET4, T3FREE, THYROIDAB,  in the last 72 hours Anemia Panel:  Recent Labs  08/03/13 1041  VITAMINB12 1102*   Coagulation: No results found for this basename: LABPROT, INR,  in the last 72 hours Urine Drug Screen: Drugs of Abuse  No results found for this basename: labopia, cocainscrnur, labbenz, amphetmu, thcu, labbarb    Alcohol Level: No results found for this basename: ETH,  in the last 72 hours Urinalysis: No results found for this basename: COLORURINE, APPERANCEUR, LABSPEC, PHURINE, GLUCOSEU, HGBUR, BILIRUBINUR, KETONESUR, PROTEINUR, UROBILINOGEN, NITRITE, LEUKOCYTESUR,  in the last 72 hours Misc. Labs:  ABGS No results found for this basename: PHART, PCO2, PO2ART, TCO2, HCO3,  in the last 72 hours CULTURES Recent Results (from the past 240 hour(s))  MRSA PCR SCREENING     Status: None   Collection Time    07/30/13  9:16 PM      Result Value Ref Range Status   MRSA by PCR NEGATIVE  NEGATIVE Final   Comment:            The GeneXpert MRSA Assay (FDA     approved for NASAL  specimens     only), is one component of a     comprehensive MRSA colonization     surveillance program. It is not     intended to diagnose MRSA     infection nor to guide or     monitor treatment for     MRSA infections.   Studies/Results: No results found.  Medications:  Prior to Admission:  Prescriptions prior to admission  Medication Sig Dispense Refill  . adalimumab (HUMIRA PEN) 40 MG/0.8ML injection Inject 40 mg into the skin every 14 (fourteen) days.       Marland Kitchen albuterol (PROAIR HFA) 108 (90 BASE) MCG/ACT inhaler Inhale 2 puffs into the lungs every 6 (six) hours as needed for wheezing or shortness of  breath.  3 Inhaler  1  . albuterol (PROVENTIL) (2.5 MG/3ML) 0.083% nebulizer solution Take 3 mLs (2.5 mg total) by nebulization every 6 (six) hours as needed for wheezing or shortness of breath.  150 mL  1  . ALPRAZolam (XANAX) 0.5 MG tablet Take 1 tablet (0.5 mg total) by mouth 2 (two) times daily.  180 tablet  0  . aspirin 325 MG tablet Take 325 mg by mouth daily.        . cetirizine (ALL DAY ALLERGY) 10 MG tablet Take 10 mg by mouth daily.      . cholecalciferol (VITAMIN D) 1000 UNITS tablet Take 1,000 Units by mouth daily.      . citalopram (CELEXA) 20 MG tablet Take 1 tablet (20 mg total) by mouth daily.  30 tablet  3  . EQL CALCIUM CITRATE/VITAMIN D PO Take by mouth. Vitamin D 500IU with Calcium Citrate 630mg       . furosemide (LASIX) 20 MG tablet Take 1 tablet (20 mg total) by mouth daily.  30 tablet  3  . guaifenesin (MUCUS RELIEF) 400 MG TABS tablet Take 400 mg by mouth daily as needed (for ocngestion).      Marland Kitchen HYDROcodone-acetaminophen (NORCO) 10-325 MG per tablet Take 1 tablet by mouth every 8 (eight) hours.      . metoprolol (LOPRESSOR) 50 MG tablet Take 1 tablet (50 mg total) by mouth 2 (two) times daily.  180 tablet  3  . tiotropium (SPIRIVA) 18 MCG inhalation capsule Place 18 mcg into inhaler and inhale every morning.        Scheduled: . antiseptic oral rinse  15 mL Mouth Rinse BID  . aspirin  325 mg Oral Daily  . cefTRIAXone (ROCEPHIN)  IV  1 g Intravenous Q24H  . cholecalciferol  1,000 Units Oral Daily  . diltiazem  60 mg Oral 4 times per day  . docusate sodium  100 mg Oral BID  . folic acid  1 mg Oral Daily  . furosemide  20 mg Oral Daily  . heparin  5,000 Units Subcutaneous 3 times per day  . insulin aspart  0-9 Units Subcutaneous TID WC  . ipratropium-albuterol  3 mL Nebulization Q6H  . loratadine  10 mg Oral Daily  . methylPREDNISolone (SOLU-MEDROL) injection  60 mg Intravenous Q12H  . multivitamin with minerals  1 tablet Oral Daily  . polyethylene glycol  17 g Oral  BID  . senna  1 tablet Oral BID  . thiamine  100 mg Oral Daily   Continuous:  KDT:[id number], acetaminophen, alum & mag hydroxide-simeth, amitriptyline, bisacodyl, guaiFENesin, hydrALAZINE, ondansetron (ZOFRAN) IV, ondansetron  Assesment: She was admitted with acute on chronic respiratory failure. She has severe COPD. I think she slowly improved  Principal Problem:   Acute respiratory failure with hypercapnia Active Problems:   COPD   Hyperlipidemia   HTN (hypertension)   COPD exacerbation   Community acquired pneumonia   Encephalopathy   FTT (failure to thrive) in adult    Plan: Continue current treatments    LOS: 6 days   [name] 08/04/2013, 9:07 AM

## 2013-08-04 NOTE — Progress Notes (Signed)
Progress Note  [name] VQQ:[id number] DOB: June 19, 1945 DOA: 07/29/2013 PCP: Redge Gainer, MD  Admit HPI / Brief Narrative: 68 year old woman with history of oxygen-dependent COPD who presented the emergency department after being found to have acute on chronic hypoxia at her primary care physician's office. Daughter reported several week history of failure to thrive, confusion, stumbling, gradually worsening hand tremors. Initial evaluation suggested community acquired pneumonia with acute COPD exacerbation.  HPI/Subjective: Pt is alert and still appears confused. Spoke with her husband and daughter today and explained that she will likely return home with home health tomorrow.   Assessment/Plan:  Acute on chronic hypoxic respiratory failure with acute respiratory acidosis and hypercapnia Difficult to manage with labile mental status - will cont to monitor in SDU due to high likelihood or requiring intermittent rescue BIPAP during day  - home tomorrow  COPD exacerbation Wheezing appears to have subsided significantly - rapidly taper steroids in hopes of improving mental status  Acute encephalopathy secondary to respiratory failure. improves throughout the course of the day, relapses at night, likely due to hypercarbia in setting of BIPAP refusal - attempt to avoid sedatives   Community acquired pneumonia Appears stable - complete empiric course of abx tx   Chronic hypoxic, hypercarbic respiratory failure, COPD, pulmonary fibrosis  Failure to thrive, subacute encephalopathy for the last 3 weeks Family described repeating questions, memory loss, stumbling, gradually worsening hand tremors for the last month. Celexa added late March for depression-like symptoms--suspect causing tremors, extrapyramidal-like symptoms, asthenia and FTT. Also consider alcohol affect, Xanax as contributing and family deny significant alcohol intake. Celexa discontinued.    Tremor As above. Suspect  related to medications given history.   Steroid-induced hyperglycemia Hemoglobin A1c normal. Stable.  Macrocytic anemia Stable. Etiology unclear, no evidence of bleeding at this time. Check B12 and folate.    History of remote alcohol abuse No more than 2 drinks per day per family.  History of psoriasis on Humira.  History of tobacco dependenc in remission  Code Status: FULL Family Communication: no family present at time of exam Disposition Plan: SDU  Consultants: Pulmonary   Procedures: TTE - 5/18 - EF 55-60% - unable comment WMA - grade 1 DD  Antibiotics: azithro 5/14 >>5/18 Rocephin 5/14 >>  DVT prophylaxis: SQ heparin  Objective: Blood pressure 149/70, pulse 89, temperature 98.1 F (36.7 C), temperature source Oral, resp. rate 25, height 4\' 5"  (1.346 m), weight 55.6 kg (122 lb 9.2 oz), SpO2 98.00%.  Intake/Output Summary (Last 24 hours) at 08/04/13 1353 Last data filed at 08/03/13 1600  Gross per 24 hour  Intake    100 ml  Output      0 ml  Net    100 ml   Exam: General: No acute respiratory distress in bed - confused but not agitated  Lungs: poor air movement th/o all fields - no active wheeze - no focal crackles  Cardiovascular: Regular rate and rhythm without murmur gallop or rub normal S1 and S2 - distant HS Abdomen: Nontender, nondistended, soft, bowel sounds positive, no rebound, no ascites, no appreciable mass Extremities: No significant cyanosis, clubbing, or edema bilateral lower extremities  Data Reviewed: Basic Metabolic Panel:  Recent Labs Lab 07/29/13 1734 07/30/13 0537 08/01/13 0502 08/02/13 0444 08/03/13 0426  NA 137 136* 141 142 142  K 3.5* 4.5 4.0 4.1 3.9  CL 84* 88* 89* 89* 90*  CO2 44* 41* 42* 44* >45*  GLUCOSE 129* 169* 159* 175* 160*  BUN 19 22 23  27* 26*  CREATININE 0.79 0.92 0.75 0.89 0.78  CALCIUM 10.8* 10.1 10.0 9.5 9.3   Liver Function Tests:  Recent Labs Lab 07/29/13 1734 07/30/13 0537  AST 28 28  ALT 16 18    ALKPHOS 96 97  BILITOT 0.6 0.3  PROT 7.9 7.3  ALBUMIN 3.2* 2.8*   CBC:  Recent Labs Lab 07/29/13 1734 07/30/13 0537 07/31/13 0508 08/01/13 0502 08/03/13 0426  WBC 3.1* 3.3* 4.3 5.7 9.2  NEUTROABS 1.9  --   --   --   --   HGB 10.3* 9.6* 9.9* 10.0* 9.9*  HCT 32.5* 29.8* 31.0* 32.5* 32.3*  MCV 101.9* 102.4* 103.0* 105.5* 105.6*  PLT 168 170 204 230 262   Cardiac Enzymes:  Recent Labs Lab 07/29/13 1734  TROPONINI <0.30   BNP (last 3 results)  Recent Labs  07/29/13 1734  PROBNP 3332.0*   CBG:  Recent Labs Lab 08/03/13 1146 08/03/13 1646 08/03/13 2157 08/04/13 0740 08/04/13 1119  GLUCAP 170* 237* 152* 160* 140*    Recent Results (from the past 240 hour(s))  MRSA PCR SCREENING     Status: None   Collection Time    07/30/13  9:16 PM      Result Value Ref Range Status   MRSA by PCR NEGATIVE  NEGATIVE Final   Comment:            The GeneXpert MRSA Assay (FDA     approved for NASAL specimens     only), is one component of a     comprehensive MRSA colonization     surveillance program. It is not     intended to diagnose MRSA     infection nor to guide or     monitor treatment for     MRSA infections.     Studies:  Recent x-ray studies have been reviewed in detail by the Attending Physician  Scheduled Meds:  Scheduled Meds: . antiseptic oral rinse  15 mL Mouth Rinse BID  . aspirin  325 mg Oral Daily  . cefTRIAXone (ROCEPHIN)  IV  1 g Intravenous Q24H  . cholecalciferol  1,000 Units Oral Daily  . diltiazem  60 mg Oral 4 times per day  . docusate sodium  100 mg Oral BID  . folic acid  1 mg Oral Daily  . furosemide  20 mg Oral Daily  . heparin  5,000 Units Subcutaneous 3 times per day  . insulin aspart  0-9 Units Subcutaneous TID WC  . ipratropium-albuterol  3 mL Nebulization Q6H  . loratadine  10 mg Oral Daily  . methylPREDNISolone (SOLU-MEDROL) injection  60 mg Intravenous Q12H  . multivitamin with minerals  1 tablet Oral Daily  . polyethylene  glycol  17 g Oral BID  . senna  1 tablet Oral BID  . thiamine  100 mg Oral Daily    Time spent on care of this patient: 35 mins   Debbe Odea , MD   [facility] Office  [phone] Pager - Text Page per Shea Evans as per below:  On-Call/Text Page:      [email or web address]      password [email or web address]  If 7PM-7AM, please contact night-coverage [email or web address] Password [email or web address] 08/04/2013, 1:53 PM   LOS: 6 days

## 2013-08-05 LAB — GLUCOSE, CAPILLARY
Glucose-Capillary: 143 mg/dL — ABNORMAL HIGH (ref 70–99)
Glucose-Capillary: 185 mg/dL — ABNORMAL HIGH (ref 70–99)

## 2013-08-05 MED ORDER — ALBUTEROL SULFATE (2.5 MG/3ML) 0.083% IN NEBU
2.5000 mg | INHALATION_SOLUTION | RESPIRATORY_TRACT | Status: DC | PRN
  Administered 2013-08-05: 2.5 mg via RESPIRATORY_TRACT
  Filled 2013-08-05: qty 3

## 2013-08-05 MED ORDER — PREDNISONE 10 MG PO TABS: 60.0000 mg | ORAL_TABLET | Freq: Every day | ORAL | Status: DC

## 2013-08-05 MED ORDER — ALPRAZOLAM 0.5 MG PO TABS
0.5000 mg | ORAL_TABLET | Freq: Two times a day (BID) | ORAL | Status: DC
  Administered 2013-08-05: 0.5 mg via ORAL
  Filled 2013-08-05: qty 1

## 2013-08-05 NOTE — Progress Notes (Signed)
Subjective: This morning she is as alert as I have ever seen her. She is oriented and able to have a conversation and previously she had been whispering but is now speaking with full voice. She denies any breathing trouble  Objective: Vital signs in last 24 hours: Temp:  [96.8 F (36 C)-98.8 F (37.1 C)] 97.9 F (36.6 C) (05/21 0400) Pulse Rate:  [71-112] 87 (05/21 0500) Resp:  [15-26] 19 (05/21 0600) BP: (83-165)/(53-113) 132/65 mmHg (05/21 0600) SpO2:  [92 %-100 %] 99 % (05/21 0711) Weight:  [54.8 kg (120 lb 13 oz)] 54.8 kg (120 lb 13 oz) (05/21 0500) Weight change: -0.8 kg (-1 lb 12.2 oz) Last BM Date: 08/04/13  Intake/Output from previous day: 05/20 0701 - 05/21 0700 In: 200 [P.O.:200] Out: -   PHYSICAL EXAM General appearance: alert, cooperative and no distress Resp: clear to auscultation bilaterally Cardio: regular rate and rhythm, S1, S2 normal, no murmur, click, rub or gallop GI: soft, non-tender; bowel sounds normal; no masses,  no organomegaly Extremities: extremities normal, atraumatic, no cyanosis or edema  Lab Results:    Basic Metabolic Panel:  Recent Labs  08/03/13 0426  NA 142  K 3.9  CL 90*  CO2 >45*  GLUCOSE 160*  BUN 26*  CREATININE 0.78  CALCIUM 9.3   Liver Function Tests: No results found for this basename: AST, ALT, ALKPHOS, BILITOT, PROT, ALBUMIN,  in the last 72 hours No results found for this basename: LIPASE, AMYLASE,  in the last 72 hours No results found for this basename: AMMONIA,  in the last 72 hours CBC:  Recent Labs  08/03/13 0426  WBC 9.2  HGB 9.9*  HCT 32.3*  MCV 105.6*  PLT 262   Cardiac Enzymes: No results found for this basename: CKTOTAL, CKMB, CKMBINDEX, TROPONINI,  in the last 72 hours BNP: No results found for this basename: PROBNP,  in the last 72 hours D-Dimer: No results found for this basename: DDIMER,  in the last 72 hours CBG:  Recent Labs  08/03/13 1646 08/03/13 2157 08/04/13 0740 08/04/13 1119  08/04/13 1625 08/04/13 2127  GLUCAP 237* 152* 160* 140* 147* 161*   Hemoglobin A1C: No results found for this basename: HGBA1C,  in the last 72 hours Fasting Lipid Panel: No results found for this basename: CHOL, HDL, LDLCALC, TRIG, CHOLHDL, LDLDIRECT,  in the last 72 hours Thyroid Function Tests: No results found for this basename: TSH, T4TOTAL, FREET4, T3FREE, THYROIDAB,  in the last 72 hours Anemia Panel:  Recent Labs  08/03/13 1041  VITAMINB12 1102*   Coagulation: No results found for this basename: LABPROT, INR,  in the last 72 hours Urine Drug Screen: Drugs of Abuse  No results found for this basename: labopia, cocainscrnur, labbenz, amphetmu, thcu, labbarb    Alcohol Level: No results found for this basename: ETH,  in the last 72 hours Urinalysis: No results found for this basename: COLORURINE, APPERANCEUR, LABSPEC, PHURINE, GLUCOSEU, HGBUR, BILIRUBINUR, KETONESUR, PROTEINUR, UROBILINOGEN, NITRITE, LEUKOCYTESUR,  in the last 72 hours Misc. Labs:  ABGS No results found for this basename: PHART, PCO2, PO2ART, TCO2, HCO3,  in the last 72 hours CULTURES Recent Results (from the past 240 hour(s))  MRSA PCR SCREENING     Status: None   Collection Time    07/30/13  9:16 PM      Result Value Ref Range Status   MRSA by PCR NEGATIVE  NEGATIVE Final   Comment:            The  GeneXpert MRSA Assay (FDA     approved for NASAL specimens     only), is one component of a     comprehensive MRSA colonization     surveillance program. It is not     intended to diagnose MRSA     infection nor to guide or     monitor treatment for     MRSA infections.   Studies/Results: No results found.  Medications:  Prior to Admission:  Prescriptions prior to admission  Medication Sig Dispense Refill  . adalimumab (HUMIRA PEN) 40 MG/0.8ML injection Inject 40 mg into the skin every 14 (fourteen) days.       Marland Kitchen albuterol (PROAIR HFA) 108 (90 BASE) MCG/ACT inhaler Inhale 2 puffs into the  lungs every 6 (six) hours as needed for wheezing or shortness of breath.  3 Inhaler  1  . albuterol (PROVENTIL) (2.5 MG/3ML) 0.083% nebulizer solution Take 3 mLs (2.5 mg total) by nebulization every 6 (six) hours as needed for wheezing or shortness of breath.  150 mL  1  . ALPRAZolam (XANAX) 0.5 MG tablet Take 1 tablet (0.5 mg total) by mouth 2 (two) times daily.  180 tablet  0  . aspirin 325 MG tablet Take 325 mg by mouth daily.        . cetirizine (ALL DAY ALLERGY) 10 MG tablet Take 10 mg by mouth daily.      . cholecalciferol (VITAMIN D) 1000 UNITS tablet Take 1,000 Units by mouth daily.      . citalopram (CELEXA) 20 MG tablet Take 1 tablet (20 mg total) by mouth daily.  30 tablet  3  . EQL CALCIUM CITRATE/VITAMIN D PO Take by mouth. Vitamin D 500IU with Calcium Citrate 630mg       . furosemide (LASIX) 20 MG tablet Take 1 tablet (20 mg total) by mouth daily.  30 tablet  3  . guaifenesin (MUCUS RELIEF) 400 MG TABS tablet Take 400 mg by mouth daily as needed (for ocngestion).      Marland Kitchen HYDROcodone-acetaminophen (NORCO) 10-325 MG per tablet Take 1 tablet by mouth every 8 (eight) hours.      . metoprolol (LOPRESSOR) 50 MG tablet Take 1 tablet (50 mg total) by mouth 2 (two) times daily.  180 tablet  3  . tiotropium (SPIRIVA) 18 MCG inhalation capsule Place 18 mcg into inhaler and inhale every morning.        Scheduled: . antiseptic oral rinse  15 mL Mouth Rinse BID  . aspirin  325 mg Oral Daily  . cefTRIAXone (ROCEPHIN)  IV  1 g Intravenous Q24H  . cholecalciferol  1,000 Units Oral Daily  . diltiazem  60 mg Oral 4 times per day  . docusate sodium  100 mg Oral BID  . folic acid  1 mg Oral Daily  . furosemide  20 mg Oral Daily  . heparin  5,000 Units Subcutaneous 3 times per day  . insulin aspart  0-9 Units Subcutaneous TID WC  . ipratropium-albuterol  3 mL Nebulization TID  . loratadine  10 mg Oral Daily  . methylPREDNISolone (SOLU-MEDROL) injection  60 mg Intravenous Q12H  . multivitamin with  minerals  1 tablet Oral Daily  . polyethylene glycol  17 g Oral BID  . senna  1 tablet Oral BID  . thiamine  100 mg Oral Daily   Continuous:  JOA:[id number], acetaminophen, albuterol, alum & mag hydroxide-simeth, amitriptyline, bisacodyl, guaiFENesin, hydrALAZINE, ondansetron (ZOFRAN) IV, ondansetron  Assesment: She was admitted with acute on  chronic respiratory failure. This morning she has improved. She has a significant history of COPD and had failure to thrive at baseline. She has been encephalopathic but is better this morning. Principal Problem:   Acute respiratory failure with hypercapnia Active Problems:   COPD   Hyperlipidemia   HTN (hypertension)   COPD exacerbation   Community acquired pneumonia   Encephalopathy   FTT (failure to thrive) in adult    Plan: Continue with current treatments. I don't have a good sense of what her baseline is but I think she's probably approaching that at least. It is my understanding that her family intends to take her home. If that is the discharge plan she will need extensive home health services et [facility]. I think she would probably be better served by an attempt at rehabilitation.    LOS: 7 days   [name] 08/05/2013, 7:21 AM

## 2013-08-05 NOTE — Progress Notes (Signed)
UR chart review completed.  

## 2013-08-05 NOTE — Discharge Summary (Signed)
Physician Discharge Summary  [name] KZS:[id number] DOB: 08-30-1945 DOA: 07/29/2013  PCP: Redge Gainer, MD  Admit date: 07/29/2013 Discharge date: 08/05/2013  Time spent: >45  minutes   Discharge Diagnoses:  Principal Problem:   Acute respiratory failure with hypercapnia  COPD exacerbation   Community acquired pneumonia  Active Problems:  Encephalopathy   Hyperlipidemia   HTN (hypertension)    FTT (failure to thrive) in adult   Discharge Condition: stable  Diet recommendation: heart healthy  Filed Weights   08/03/13 0500 08/04/13 0500 08/05/13 0500  Weight: 55.5 kg (122 lb 5.7 oz) 55.6 kg (122 lb 9.2 oz) 54.8 kg (120 lb 13 oz)    History of present illness:  68 year old woman with history of oxygen-dependent COPD who presented the emergency department after being found to have acute on chronic hypoxia at her primary care physician's office. Daughter reported several week history of failure to thrive, confusion, stumbling, gradually worsening hand tremors. Initial evaluation suggested community acquired pneumonia with acute COPD exacerbation.   Hospital Course:  Acute on chronic hypoxic respiratory failure with acute respiratory acidosis and hypercapnia  Required intermittent BiPAP treatment in the hospital- has improved to close to her baseline and has been able to ambulate without too much difficulty in breathing- she has been recommended to go to a rehab facility but instead she and her family would like her to go home and received HHPT. This has been arranged. She will cont with 2 L O2 continuously as she she was previously using at home.  - Her Pulmonologist is Dr Lamonte Sakai who she will cont to follow with  Community acquired pneumonia  improving - complete empiric course of abx tx   COPD exacerbation  Wheezing appears to have subsided significantly - taper steroids   Acute encephalopathy secondary to respiratory failure.  - Was quite severe and related to acute  infection and CO2 retention - she has returned to an alert and oriented cognitive status  Failure to thrive, subacute encephalopathy for the last 3 weeks  Family described repeating questions, memory loss, stumbling, gradually worsening hand tremors for the last month.  - Celexa ccan cause extra-pyramidal symptoms and may be the cause of her acute decline as it was started 3 wks ago- I will not change this medication but have asked patient and family to discuss with with her doctor.   Tremor  As above.   Steroid-induced hyperglycemia  Hemoglobin A1c normal. Stable.   Macrocytic anemia  Stable. Etiology unclear, no evidence of bleeding at this time. - B12 and folate levels normal  History of remote alcohol abuse  No more than 2 drinks per day per family.   History of psoriasis  on Humira.   History of tobacco dependenc  in remission   Procedures:  none  Consultations:  pulmonary  Discharge Exam: Filed Vitals:   08/05/13 0600 08/05/13 0700 08/05/13 0711 08/05/13 0932  BP: 132/65 170/69    Pulse:      Temp:    97.7 F (36.5 C)  TempSrc:    Oral  Resp: 19 19    Height:      Weight:      SpO2:   99%      General: No acute respiratory distress- alert and oriented x 3 Lungs: poor air movement th/o all fields - no active wheeze - no focal crackles  Cardiovascular: Regular rate and rhythm without murmur gallop or rub normal S1 and S2 - distant HS  Abdomen: Nontender, nondistended,  soft, bowel sounds positive, no rebound, no ascites, no appreciable mass  Extremities: No significant cyanosis, clubbing, or edema bilateral lower extremities   Discharge Instructions You were cared for by a hospitalist during your hospital stay. If you have any questions about your discharge medications or the care you received while you were in the hospital after you are discharged, you can call the unit and asked to speak with the hospitalist on call if the hospitalist that took care of  you is not available. Once you are discharged, your primary care physician will handle any further medical issues. Please note that NO REFILLS for any discharge medications will be authorized once you are discharged, as it is imperative that you return to your primary care physician (or establish a relationship with a primary care physician if you do not have one) for your aftercare needs so that they can reassess your need for medications and monitor your lab values.      Discharge Instructions   Diet - low sodium heart healthy    Complete by:  As directed      Increase activity slowly    Complete by:  As directed             Medication List         albuterol (2.5 MG/3ML) 0.083% nebulizer solution  Commonly known as:  PROVENTIL  Take 3 mLs (2.5 mg total) by nebulization every 6 (six) hours as needed for wheezing or shortness of breath.     albuterol 108 (90 BASE) MCG/ACT inhaler  Commonly known as:  PROAIR HFA  Inhale 2 puffs into the lungs every 6 (six) hours as needed for wheezing or shortness of breath.     ALL DAY ALLERGY 10 MG tablet  Generic drug:  cetirizine  Take 10 mg by mouth daily.     ALPRAZolam 0.5 MG tablet  Commonly known as:  XANAX  Take 1 tablet (0.5 mg total) by mouth 2 (two) times daily.     amitriptyline 50 MG tablet  Commonly known as:  ELAVIL  TAKE 1 TABLET AT BEDTIME     aspirin 325 MG tablet  Take 325 mg by mouth daily.     cholecalciferol 1000 UNITS tablet  Commonly known as:  VITAMIN D  Take 1,000 Units by mouth daily.     citalopram 20 MG tablet  Commonly known as:  CELEXA  Take 1 tablet (20 mg total) by mouth daily.     EQL CALCIUM CITRATE/VITAMIN D PO  Take by mouth. Vitamin D 500IU with Calcium Citrate 630mg      furosemide 20 MG tablet  Commonly known as:  LASIX  Take 1 tablet (20 mg total) by mouth daily.     HUMIRA PEN 40 MG/0.8ML injection  Generic drug:  adalimumab  Inject 40 mg into the skin every 14 (fourteen) days.      HYDROcodone-acetaminophen 10-325 MG per tablet  Commonly known as:  NORCO  Take 1 tablet by mouth every 8 (eight) hours.     metoprolol 50 MG tablet  Commonly known as:  LOPRESSOR  Take 1 tablet (50 mg total) by mouth 2 (two) times daily.     MUCUS RELIEF 400 MG Tabs tablet  Generic drug:  guaifenesin  Take 400 mg by mouth daily as needed (for ocngestion).     predniSONE 10 MG tablet  Commonly known as:  DELTASONE  Take 6 tablets (60 mg total) by mouth daily with breakfast.  tiotropium 18 MCG inhalation capsule  Commonly known as:  SPIRIVA  Place 18 mcg into inhaler and inhale every morning.       Allergies  Allergen Reactions  . Nickel   . Tramadol Hcl     REACTION: nausea and vomiting   Follow-up Information   Follow up with HAWKINS,EDWARD L, MD. Schedule an appointment as soon as possible for a visit in 2 weeks.   Specialty:  Pulmonary Disease   Contact information:   [address] [place] [address] [phone]        The results of significant diagnostics from this hospitalization (including imaging, microbiology, ancillary and laboratory) are listed below for reference.    Significant Diagnostic Studies: Dg Chest 2 View  07/29/2013   CLINICAL DATA:  Shortness of breath.  EXAM: CHEST  2 VIEW  COMPARISON:  05/17/2013  FINDINGS: Lungs are adequately inflated and demonstrate slight worsening right base opacification as cannot exclude acute on chronic process due to infection. Stable coarse interstitial changes over the lung apices. Cardiomediastinal silhouette and remainder of the exam is unchanged.  IMPRESSION: Slight worsening opacification in the right base as cannot exclude acute infection on a background of chronic changes. Stable chronic apical coarse interstitial changes.   Electronically Signed   By: Marin Olp M.D.   On: 07/29/2013 18:18   Dg Chest Port 1 View  08/01/2013   CLINICAL DATA:  Respiratory failure.  EXAM: PORTABLE CHEST  - 1 VIEW  COMPARISON:  07/29/2013.  FINDINGS: The heart remains normal in size. Stable prominence of the interstitial markings. Cervical spine degenerative changes. Right rotator cuff calcific tendinitis.  IMPRESSION: Stable chronic interstitial lung disease.  No acute abnormality.   Electronically Signed   By: Enrique Sack M.D.   On: 08/01/2013 05:58    Microbiology: Recent Results (from the past 240 hour(s))  MRSA PCR SCREENING     Status: None   Collection Time    07/30/13  9:16 PM      Result Value Ref Range Status   MRSA by PCR NEGATIVE  NEGATIVE Final   Comment:            The GeneXpert MRSA Assay (FDA     approved for NASAL specimens     only), is one component of a     comprehensive MRSA colonization     surveillance program. It is not     intended to diagnose MRSA     infection nor to guide or     monitor treatment for     MRSA infections.     Labs: Basic Metabolic Panel:  Recent Labs Lab 07/29/13 1734 07/30/13 0537 08/01/13 0502 08/02/13 0444 08/03/13 0426  NA 137 136* 141 142 142  K 3.5* 4.5 4.0 4.1 3.9  CL 84* 88* 89* 89* 90*  CO2 44* 41* 42* 44* >45*  GLUCOSE 129* 169* 159* 175* 160*  BUN 19 22 23  27* 26*  CREATININE 0.79 0.92 0.75 0.89 0.78  CALCIUM 10.8* 10.1 10.0 9.5 9.3   Liver Function Tests:  Recent Labs Lab 07/29/13 1734 07/30/13 0537  AST 28 28  ALT 16 18  ALKPHOS 96 97  BILITOT 0.6 0.3  PROT 7.9 7.3  ALBUMIN 3.2* 2.8*   No results found for this basename: LIPASE, AMYLASE,  in the last 168 hours No results found for this basename: AMMONIA,  in the last 168 hours CBC:  Recent Labs Lab 07/29/13 1734 07/30/13 0537 07/31/13 0508 08/01/13  0502 08/03/13 0426  WBC 3.1* 3.3* 4.3 5.7 9.2  NEUTROABS 1.9  --   --   --   --   HGB 10.3* 9.6* 9.9* 10.0* 9.9*  HCT 32.5* 29.8* 31.0* 32.5* 32.3*  MCV 101.9* 102.4* 103.0* 105.5* 105.6*  PLT 168 170 204 230 262   Cardiac Enzymes:  Recent Labs Lab 07/29/13 1734  TROPONINI <0.30    BNP: BNP (last 3 results)  Recent Labs  07/29/13 1734  PROBNP 3332.0*   CBG:  Recent Labs Lab 08/04/13 0740 08/04/13 1119 08/04/13 1625 08/04/13 2127 08/05/13 0736  GLUCAP 160* 140* 147* 161* 143*       Signed:  Kaius Daino  [facility] 08/05/2013, 9:44 AM

## 2013-08-05 NOTE — Progress Notes (Signed)
Attempted to see pt today. She is pending d/c home today. Recommend home with husband providing assistance with mobility and follow-up HHPT.

## 2013-08-05 NOTE — Progress Notes (Signed)
D.c instructions reviewed with patient, husband, and daughter.  Verbalized understanding.  Pt dc'd to home with family.  [name] 08/05/2013

## 2013-08-06 ENCOUNTER — Telehealth: Payer: Self-pay | Admitting: Nurse Practitioner

## 2013-08-06 MED ORDER — PREDNISONE 10 MG PO TABS: 60.0000 mg | ORAL_TABLET | Freq: Every day | ORAL | Status: DC

## 2013-08-06 NOTE — Telephone Encounter (Signed)
rx ready for pickup 

## 2013-08-06 NOTE — Telephone Encounter (Signed)
She needs her prednisone the hospital sent to mail order can you we send to [facility]

## 2013-08-06 NOTE — Telephone Encounter (Signed)
Patient aware that rx ready

## 2013-08-10 ENCOUNTER — Ambulatory Visit (INDEPENDENT_AMBULATORY_CARE_PROVIDER_SITE_OTHER): Payer: Medicare HMO | Admitting: Nurse Practitioner

## 2013-08-10 ENCOUNTER — Encounter: Payer: Self-pay | Admitting: Nurse Practitioner

## 2013-08-10 ENCOUNTER — Other Ambulatory Visit: Payer: Self-pay | Admitting: Nurse Practitioner

## 2013-08-10 VITALS — BP 129/63 | HR 93 | Temp 99.3°F | Ht <= 58 in | Wt 115.0 lb

## 2013-08-10 DIAGNOSIS — F329 Major depressive disorder, single episode, unspecified: Secondary | ICD-10-CM

## 2013-08-10 DIAGNOSIS — F32A Depression, unspecified: Secondary | ICD-10-CM

## 2013-08-10 DIAGNOSIS — J438 Other emphysema: Secondary | ICD-10-CM

## 2013-08-10 DIAGNOSIS — J439 Emphysema, unspecified: Secondary | ICD-10-CM

## 2013-08-10 DIAGNOSIS — Z09 Encounter for follow-up examination after completed treatment for conditions other than malignant neoplasm: Secondary | ICD-10-CM

## 2013-08-10 DIAGNOSIS — F3289 Other specified depressive episodes: Secondary | ICD-10-CM

## 2013-08-10 MED ORDER — ALPRAZOLAM 0.5 MG PO TABS: 0.5000 mg | ORAL_TABLET | Freq: Two times a day (BID) | ORAL | Status: DC

## 2013-08-10 MED ORDER — HYDROCODONE-ACETAMINOPHEN 10-325 MG PO TABS: 1.0000 | ORAL_TABLET | Freq: Two times a day (BID) | ORAL | Status: DC

## 2013-08-10 MED ORDER — BUPROPION HCL ER (XL) 150 MG PO TB24: 150.0000 mg | ORAL_TABLET | Freq: Every day | ORAL | Status: DC

## 2013-08-10 NOTE — Progress Notes (Signed)
   Subjective:    Patient ID: [name], female    DOB: 1946/03/09, 68 y.o.   MRN: [id number]  HPI Patient back to day for hospital follow up- She was sent May 14,2015 with COPD and pneumonia- was given IV antibiotics and lots of breathing treatment- Patient says that she feels much better- Still very weak but is doing well. Her breathing is the best it has been in awhile.  The doctors at hospital took her off celexa because they thought that was causing tremors. WAs given one by accident at home and the jerking started again.    Review of Systems  Constitutional: Negative.   HENT: Negative.   Respiratory: Negative.   Cardiovascular: Negative.   Genitourinary: Negative.   Neurological: Negative.   Psychiatric/Behavioral: Negative.   All other systems reviewed and are negative.      Objective:   Physical Exam  Constitutional: She is oriented to person, place, and time. She appears well-developed and well-nourished.  Cardiovascular: Normal rate, regular rhythm and normal heart sounds.   Pulmonary/Chest: Effort normal. She has wheezes (expiratory wheezes throughout).  Neurological: She is alert and oriented to person, place, and time.  Skin: Skin is warm and dry.  Psychiatric: She has a normal mood and affect. Her behavior is normal. Judgment and thought content normal.   BP 129/63  Pulse 93  Temp(Src) 99.3 F (37.4 C) (Oral)  Ht 4\' 5"  (1.346 m)  Wt 115 lb (52.164 kg)  BMI 28.79 kg/m2  O2 Sat 91% on conservative O2 via cannula       Assessment & Plan:   1. Hospital discharge follow-up   2. COPD (chronic obstructive pulmonary disease) with emphysema   3. Depression    Meds ordered this encounter  Medications  . HYDROcodone-acetaminophen (NORCO) 10-325 MG per tablet    Sig: Take 1 tablet by mouth 2 (two) times daily.    Dispense:  60 tablet    Refill:  0    Order Specific Question:  Supervising Provider    Answer:  Chipper Herb [[id number]]  . buPROPion  (WELLBUTRIN XL) 150 MG 24 hr tablet    Sig: Take 1 tablet (150 mg total) by mouth daily.    Dispense:  30 tablet    Refill:  3    Order Specific Question:  Supervising Provider    Answer:  Chipper Herb [[id number]]    Reviewed hospital records Continue all meds Continue O2 as rx RTO in 10 days  [place], FNP

## 2013-08-10 NOTE — Telephone Encounter (Signed)
Please call in xanax with 1 refills 

## 2013-08-10 NOTE — Patient Instructions (Addendum)
Chronic Obstructive Pulmonary Disease Exacerbation  Chronic obstructive pulmonary disease (COPD) is a common lung problem. In COPD, the flow of air from the lungs is limited. COPD exacerbations are times that breathing gets worse and you need extra treatment. Without treatment they can be life threatening. If they happen often, your lungs can become more damaged. HOME CARE  Do not smoke.  Avoid tobacco smoke and other things that bother your lungs.  If given, take your antibiotic medicine as told. Finish the medicine even if you start to feel better.  Only take medicines as told by your doctor.  Drink enough fluids to keep your pee (urine) clear or pale yellow (unless your doctor has told you not to).  Use a cool mist machine (vaporizer).  If you use oxygen or a machine that turns liquid medicine into a mist (nebulizer), continue to use them as told.  Keep up with shots (vaccinations) as told by your doctor.  Exercise regularly.  Eat healthy foods.  Keep all doctor visits as told. GET HELP RIGHT AWAY IF:  You are very short of breath and it gets worse.  You have trouble talking.  You have bad chest pain.  You have blood in your spit (sputum).  You have a fever.  You keep throwing up (vomiting).  You feel weak, or you pass out (faint).  You feel confused.  You keep getting worse. MAKE SURE YOU:   Understand these instructions.  Will watch your condition.  Will get help right away if you are not doing well or get worse. Document Released: 02/21/2011 Document Revised: 12/23/2012 Document Reviewed: 11/06/2012 [facility] Patient Information 2014 [employer], [place]. 1.5 Gram Low Sodium Diet A 1.5 gram sodium diet restricts the amount of sodium in the diet to no more than 1.5 g or 1500 mg daily. The American Heart Association recommends Americans over the age of 91 to consume no more than 1500 mg of sodium each day to reduce the risk of developing high blood pressure.  Research also shows that limiting sodium may reduce heart attack and stroke risk. Many foods contain sodium for flavor and sometimes as a preservative. When the amount of sodium in a diet needs to be low, it is important to know what to look for when choosing foods and drinks. The following includes some information and guidelines to help make it easier for you to adapt to a low sodium diet. QUICK TIPS  Do not add salt to food.  Avoid convenience items and fast food.  Choose unsalted snack foods.  Buy lower sodium products, often labeled as "lower sodium" or "no salt added."  Check food labels to learn how much sodium is in 1 serving.  When eating at a restaurant, ask that your food be prepared with less salt or none, if possible. READING FOOD LABELS FOR SODIUM INFORMATION The nutrition facts label is a good place to find how much sodium is in foods. Look for products with no more than 400 mg of sodium per serving. Remember that 1.5 g = 1500 mg. The food label may also list foods as:  Sodium-free: Less than 5 mg in a serving.  Very low sodium: 35 mg or less in a serving.  Low-sodium: 140 mg or less in a serving.  Light in sodium: 50% less sodium in a serving. For example, if a food that usually has 300 mg of sodium is changed to become light in sodium, it will have 150 mg of sodium.  Reduced sodium:  25% less sodium in a serving. For example, if a food that usually has 400 mg of sodium is changed to reduced sodium, it will have 300 mg of sodium. CHOOSING FOODS Grains  Avoid: Salted crackers and snack items. Some cereals, including instant hot cereals. Bread stuffing and biscuit mixes. Seasoned rice or pasta mixes.  Choose: Unsalted snack items. Low-sodium cereals, oats, puffed wheat and rice, shredded wheat. English muffins and bread. Pasta. Meats  Avoid: Salted, canned, smoked, spiced, pickled meats, including fish and poultry. Bacon, ham, sausage, cold cuts, hot dogs,  anchovies.  Choose: Low-sodium canned tuna and salmon. Fresh or frozen meat, poultry, and fish. Dairy  Avoid: Processed cheese and spreads. Cottage cheese. Buttermilk and condensed milk. Regular cheese.  Choose: Milk. Low-sodium cottage cheese. Yogurt. Sour cream. Low-sodium cheese. Fruits and Vegetables  Avoid: Regular canned vegetables. Regular canned tomato sauce and paste. Frozen vegetables in sauces. Olives. [name]. Relishes. Sauerkraut.  Choose: Low-sodium canned vegetables. Low-sodium tomato sauce and paste. Frozen or fresh vegetables. Fresh and frozen fruit. Condiments  Avoid: Canned and packaged gravies. Worcestershire sauce. Tartar sauce. Barbecue sauce. Soy sauce. Steak sauce. Ketchup. Onion, garlic, and table salt. Meat flavorings and tenderizers.  Choose: Fresh and dried herbs and spices. Low-sodium varieties of mustard and ketchup. Lemon juice. Tabasco sauce. Horseradish. SAMPLE 1.5 GRAM SODIUM MEAL PLAN Breakfast / Sodium (mg)  1 cup low-fat milk / 143 mg  1 whole-wheat English muffin / 240 mg  1 tbs heart-healthy margarine / 153 mg  1 hard-boiled egg / 139 mg  1 small orange / 0 mg Lunch / Sodium (mg)  1 cup raw carrots / 76 mg  2 tbs no salt added peanut butter / 5 mg  2 slices whole-wheat bread / 270 mg  1 tbs jelly / 6 mg   cup red grapes / 2 mg Dinner / Sodium (mg)  1 cup whole-wheat pasta / 2 mg  1 cup low-sodium tomato sauce / 73 mg  3 oz lean ground beef / 57 mg  1 small side salad (1 cup raw spinach leaves,  cup cucumber,  cup yellow bell pepper) with 1 tsp olive oil and 1 tsp red wine vinegar / 25 mg Snack / Sodium (mg)  1 container low-fat vanilla yogurt / 107 mg  3 graham cracker squares / 127 mg Nutrient Analysis  Calories: 1745  Protein: 75 g  Carbohydrate: 237 g  Fat: 57 g  Sodium: 1425 mg Document Released: 03/04/2005 Document Revised: 05/27/2011 Document Reviewed: 06/05/2009 [employer] Patient Information 2014  [place], [place].

## 2013-08-10 NOTE — Telephone Encounter (Signed)
Last seen 07/29/13  MMM Print for mail order and route to nurse

## 2013-08-11 ENCOUNTER — Other Ambulatory Visit: Payer: Self-pay | Admitting: Nurse Practitioner

## 2013-08-11 NOTE — Telephone Encounter (Signed)
Please print xanax for mail order

## 2013-08-11 NOTE — Telephone Encounter (Signed)
Done at appointment

## 2013-08-13 ENCOUNTER — Telehealth: Payer: Self-pay | Admitting: Nurse Practitioner

## 2013-08-17 ENCOUNTER — Telehealth: Payer: Self-pay | Admitting: Nurse Practitioner

## 2013-08-18 NOTE — Telephone Encounter (Signed)
Patient aware.

## 2013-08-18 NOTE — Telephone Encounter (Signed)
Yes i think it would be fine to go ahead

## 2013-08-19 ENCOUNTER — Ambulatory Visit (INDEPENDENT_AMBULATORY_CARE_PROVIDER_SITE_OTHER): Payer: Medicare HMO | Admitting: Nurse Practitioner

## 2013-08-19 ENCOUNTER — Encounter: Payer: Self-pay | Admitting: Nurse Practitioner

## 2013-08-19 VITALS — BP 115/65 | HR 72 | Temp 98.1°F | Ht 58.5 in | Wt 115.6 lb

## 2013-08-19 DIAGNOSIS — J449 Chronic obstructive pulmonary disease, unspecified: Secondary | ICD-10-CM

## 2013-08-19 NOTE — Progress Notes (Signed)
   Subjective:    Patient ID: [name], female    DOB: May 08, 1945, 68 y.o.   MRN: [id number]  HPI patient was seen ten days ago for COPD, pt was given steroid. She completed the prescribed dose. She is on 2L oxygen continuously. She is using incentive spirometer 3 to 4 times a day at home. She has physical therapy 3 times a week and tolerating it well.   She is on lasix and continues to take it.   * Saw Dr. Luan Pulling yesterday and added BREO  Review of Systems  Constitutional: Negative.   HENT: Negative.   Respiratory: Positive for wheezing. Negative for cough and stridor.        Objective:   Physical Exam  Constitutional: She is oriented to person, place, and time. She appears well-developed and well-nourished.  Cardiovascular: Normal rate and regular rhythm.   Pulmonary/Chest: She has wheezes (faint exp).  Neurological: She is alert and oriented to person, place, and time.      BP 115/65  Pulse 72  Temp(Src) 98.1 F (36.7 C) (Oral)  Ht 4' 10.5" (1.486 m)  Wt 115 lb 9.6 oz (52.436 kg)  BMI 23.75 kg/m2     Assessment & Plan:   1. COPD (chronic obstructive pulmonary disease)    Continue home health Breathing exercises Continue all meds Follow up in 1 month and prn  Mary-Margaret Hassell Done, FNP

## 2013-08-19 NOTE — Patient Instructions (Signed)
Chronic Obstructive Pulmonary Disease  Chronic obstructive pulmonary disease (COPD) is a common lung condition in which airflow from the lungs is limited. COPD is a general term that can be used to describe many different lung problems that limit airflow, including both chronic bronchitis and emphysema.  If you have COPD, your lung function will probably never return to normal, but there are measures you can take to improve lung function and make yourself feel better.   CAUSES   · Smoking (common).    · Exposure to secondhand smoke.    · Genetic problems.  · Chronic inflammatory lung diseases or recurrent infections.  SYMPTOMS   · Shortness of breath, especially with physical activity.    · Deep, persistent (chronic) cough with a large amount of thick mucus.    · Wheezing.    · Rapid breaths (tachypnea).    · Gray or bluish discoloration (cyanosis) of the skin, especially in fingers, toes, or lips.    · Fatigue.    · Weight loss.    · Frequent infections or episodes when breathing symptoms become much worse (exacerbations).    · Chest tightness.  DIAGNOSIS   Your healthcare provider will take a medical history and perform a physical examination to make the initial diagnosis.  Additional tests for COPD may include:   · Lung (pulmonary) function tests.  · Chest X-ray.  · CT scan.  · Blood tests.  TREATMENT   Treatment available to help you feel better when you have COPD include:   · Inhaler and nebulizer medicines. These help manage the symptoms of COPD and make your breathing more comfortable  · Supplemental oxygen. Supplemental oxygen is only helpful if you have a low oxygen level in your blood.    · Exercise and physical activity. These are beneficial for nearly all people with COPD. Some people may also benefit from a pulmonary rehabilitation program.  HOME CARE INSTRUCTIONS   · Take all medicines (inhaled or pills) as directed by your health care provider.  · Only take over-the-counter or prescription medicines  for pain, fever, or discomfort as directed by your health care provider.    · Avoid over-the-counter medicines or cough syrups that dry up your airway (such as antihistamines) and slow down the elimination of secretions unless instructed otherwise by your healthcare provider.    · If you are a smoker, the most important thing that you can do is stop smoking. Continuing to smoke will cause further lung damage and breathing trouble. Ask your health care provider for help with quitting smoking. He or she can direct you to community resources or hospitals that provide support.  · Avoid exposure to irritants such as smoke, chemicals, and fumes that aggravate your breathing.  · Use oxygen therapy and pulmonary rehabilitation if directed by your health care provider. If you require home oxygen therapy, ask your healthcare provider whether you should purchase a pulse oximeter to measure your oxygen level at home.    · Avoid contact with individuals who have a contagious illness.  · Avoid extreme temperature and humidity changes.  · Eat healthy foods. Eating smaller, more frequent meals and resting before meals may help you maintain your strength.  · Stay active, but balance activity with periods of rest. Exercise and physical activity will help you maintain your ability to do things you want to do.  · Preventing infection and hospitalization is very important when you have COPD. Make sure to receive all the vaccines your health care provider recommends, especially the pneumococcal and influenza vaccines. Ask your healthcare provider whether you   need a pneumonia vaccine.  · Learn and use relaxation techniques to manage stress.  · Learn and use controlled breathing techniques as directed by your health care provider. Controlled breathing techniques include:    · Pursed lip breathing. Start by breathing in (inhaling) through your nose for 1 second. Then, purse your lips as if you were going to whistle and breathe out (exhale)  through the pursed lips for 2 seconds.    · Diaphragmatic breathing. Start by putting one hand on your abdomen just above your waist. Inhale slowly through your nose. The hand on your abdomen should move out. Then purse your lips and exhale slowly. You should be able to feel the hand on your abdomen moving in as you exhale.    · Learn and use controlled coughing to clear mucus from your lungs. Controlled coughing is a series of short, progressive coughs. The steps of controlled coughing are:    1. Lean your head slightly forward.    2. Breathe in deeply using diaphragmatic breathing.    3. Try to hold your breath for 3 seconds.    4. Keep your mouth slightly open while coughing twice.    5. Spit any mucus out into a tissue.    6. Rest and repeat the steps once or twice as needed.  SEEK MEDICAL CARE IF:   · You are coughing up more mucus than usual.    · There is a change in the color or thickness of your mucus.    · Your breathing is more labored than usual.    · Your breathing is faster than usual.    SEEK IMMEDIATE MEDICAL CARE IF:   · You have shortness of breath while you are resting.    · You have shortness of breath that prevents you from:  · Being able to talk.    · Performing your usual physical activities.    · You have chest pain lasting longer than 5 minutes.    · Your skin color is more cyanotic than usual.  · You measure low oxygen saturations for longer than 5 minutes with a pulse oximeter.  MAKE SURE YOU:   · Understand these instructions.  · Will watch your condition.  · Will get help right away if you are not doing well or get worse.  Document Released: 12/12/2004 Document Revised: 12/23/2012 Document Reviewed: 10/29/2012  [employer]® Patient Information ©2014 [employer].

## 2013-08-23 ENCOUNTER — Telehealth: Payer: Self-pay | Admitting: Family Medicine

## 2013-08-23 NOTE — Telephone Encounter (Signed)
Probably due to alllergies

## 2013-08-23 NOTE — Telephone Encounter (Signed)
Patient aware told to take otc allergy med and some nasal spray if no better in a couple of days needs to be seen

## 2013-08-25 ENCOUNTER — Ambulatory Visit (INDEPENDENT_AMBULATORY_CARE_PROVIDER_SITE_OTHER): Payer: Medicare HMO | Admitting: Family Medicine

## 2013-08-25 ENCOUNTER — Encounter: Payer: Self-pay | Admitting: Family Medicine

## 2013-08-25 ENCOUNTER — Ambulatory Visit: Payer: Medicare HMO | Admitting: Family Medicine

## 2013-08-25 ENCOUNTER — Ambulatory Visit (INDEPENDENT_AMBULATORY_CARE_PROVIDER_SITE_OTHER): Payer: Medicare HMO

## 2013-08-25 VITALS — BP 90/53 | HR 75 | Temp 97.7°F | Ht 58.5 in | Wt 116.0 lb

## 2013-08-25 DIAGNOSIS — J441 Chronic obstructive pulmonary disease with (acute) exacerbation: Secondary | ICD-10-CM

## 2013-08-25 DIAGNOSIS — J029 Acute pharyngitis, unspecified: Secondary | ICD-10-CM

## 2013-08-25 DIAGNOSIS — B370 Candidal stomatitis: Secondary | ICD-10-CM

## 2013-08-25 LAB — POCT CBC
Granulocyte percent: 47.8 %G (ref 37–80)
HCT, POC: 31.8 % — AB (ref 37.7–47.9)
HEMOGLOBIN: 10 g/dL — AB (ref 12.2–16.2)
Lymph, poc: 2.4 (ref 0.6–3.4)
MCH, POC: 31.5 pg — AB (ref 27–31.2)
MCHC: 31.6 g/dL — AB (ref 31.8–35.4)
MCV: 99.7 fL — AB (ref 80–97)
MPV: 7.6 fL (ref 0–99.8)
POC GRANULOCYTE: 2.7 (ref 2–6.9)
POC LYMPH PERCENT: 41.4 %L (ref 10–50)
Platelet Count, POC: 191 10*3/uL (ref 142–424)
RBC: 3.2 M/uL — AB (ref 4.04–5.48)
RDW, POC: 14.6 %
WBC: 5.7 10*3/uL (ref 4.6–10.2)

## 2013-08-25 LAB — POCT SKIN KOH: SKIN KOH, POC: POSITIVE

## 2013-08-25 LAB — POCT RAPID STREP A (OFFICE): RAPID STREP A SCREEN: NEGATIVE

## 2013-08-25 MED ORDER — NYSTATIN 100000 UNIT/ML MT SUSP: OROMUCOSAL | Status: DC

## 2013-08-25 MED ORDER — METHYLPREDNISOLONE ACETATE 80 MG/ML IJ SUSP
60.0000 mg | Freq: Once | INTRAMUSCULAR | Status: AC
  Administered 2013-08-25: 60 mg via INTRAMUSCULAR

## 2013-08-25 NOTE — Progress Notes (Signed)
Subjective:    Patient ID: FLORENA KOZMA, female    DOB: 1945/12/27, 68 y.o.   MRN: [id number]  HPI Patient comes in today complaining of a sore throat for 2 days. She has been using warm salt water gargles and taking generic Zyrtec. Please see the review of systems below. The patient comes to the visit today with her husband and she is using oxygen and has a rolling walker.   Review of Systems  Constitutional: Negative.  Negative for fever.  HENT: Positive for sore throat, trouble swallowing and voice change. Negative for congestion, drooling, mouth sores, postnasal drip, rhinorrhea, sinus pressure and sneezing.   Eyes: Negative.   Respiratory: Positive for cough (mildly inreased from baseline), shortness of breath and wheezing (physical therapist noted wheezing on exam this morning). Negative for chest tightness. Choking: mild.   Cardiovascular: Positive for palpitations. Negative for chest pain and leg swelling.  Gastrointestinal: Negative.   Endocrine: Negative.   Genitourinary: Negative.   Musculoskeletal: Negative.   Skin: Negative.   Allergic/Immunologic: Negative.   Neurological: Negative.   Hematological: Negative.   Psychiatric/Behavioral: Negative.        Objective:   Physical Exam  Nursing note and vitals reviewed. Constitutional: She is oriented to person, place, and time. No distress.  The patient is alert but somewhat frail-looking for her age  HENT:  Right Ear: External ear normal.  Left Ear: External ear normal.  Nose: Nose normal.  Mouth/Throat: Oropharyngeal exudate (the soft palate and posterior throat had a heavy white coating. Erythema and edema in the back of the throat.) present.  Eyes: Conjunctivae and EOM are normal. Pupils are equal, round, and reactive to light. Right eye exhibits no discharge. Left eye exhibits no discharge. No scleral icterus.  Neck: Normal range of motion. Neck supple. No thyromegaly present.  No adenopathy  Cardiovascular:  Normal rate, regular rhythm and normal heart sounds.   No murmur heard. Pulmonary/Chest: Effort normal. No respiratory distress. She has wheezes. She has no rales. She exhibits no tenderness.  Breath sounds were distant. There was wheezing heard sparsely by laterally.  Abdominal: Soft. Bowel sounds are normal. She exhibits no mass. There is no guarding.  Musculoskeletal: Normal range of motion.  Lymphadenopathy:    She has no cervical adenopathy.  Neurological: She is alert and oriented to person, place, and time.  Skin: Skin is warm and dry. No rash noted.  Psychiatric: She has a normal mood and affect. Her behavior is normal. Judgment and thought content normal.   BP 90/53  Pulse 75  Temp(Src) 97.7 F (36.5 C) (Oral)  Ht 4' 10.5" (1.486 m)  Wt 116 lb (52.617 kg)  BMI 23.83 kg/m2   Results for orders placed in visit on 08/25/13  POCT RAPID STREP A (OFFICE)      Result Value Ref Range   Rapid Strep A Screen Negative  Negative  POCT SKIN KOH      Result Value Ref Range   Skin KOH, POC Positive      [facility] reading (PRIMARY) by  Dr.Jacquise Rarick--increased congestion right lung base  , we got a stat reading on the chest x-ray and the radiologist felt that the congestion and the lung was improved from previous readings. The patient was informed of this.                              The CBC was done and the white blood  cell count was not elevated and the hemoglobin was low but she indicates this has been the case in the past. The copy of the CBC will be sent to her dermatologist and her pulmonologist.  She was then given a shot of Depo-Medrol in order to help her wheezing. Face-to-face time with this patient was greater than 45 minutes    Assessment & Plan:  1. Sore throat - POCT rapid strep A - Strep A culture, throat  2. Thrush - POCT Skin KOH  3. COPD exacerbation - POCT CBC - BMP8+EGFR - DG Chest 2 View; Future - methylPREDNISolone acetate (DEPO-MEDROL) injection 60 mg; Inject  0.75 mLs (60 mg total) into the muscle once.  Patient Instructions  Please use the medication as directed--swish and swallow 1 teaspoon 4 times daily after meals and at bedtime Continue Mucinex for cough and congestion Drink plenty of fluids Take Tylenol for aches pains and fever Discontinue the Breo inhaler We will call you with the results of a chest x-ray and lab work is as results are available Continued nebulizer and Spiriva    Arrie Senate MD

## 2013-08-25 NOTE — Patient Instructions (Addendum)
Please use the medication as directed--swish and swallow 1 teaspoon 4 times daily after meals and at bedtime Continue Mucinex for cough and congestion Drink plenty of fluids Take Tylenol for aches pains and fever Discontinue the Breo inhaler We will call you with the results of a chest x-ray and lab work is as results are available Continued nebulizer and Spiriva  Thrush, Adult  Thrush, also called oral candidiasis, is a fungal infection that develops in the mouth and throat and on the tongue. It causes white patches to form on the mouth and tongue. Ritta Slot is most common in older adults, but it can occur at any age.  Many cases of thrush are mild, but this infection can also be more serious. Ritta Slot can be a recurring problem for people who have chronic illnesses or who take medicines that limit the body's ability to fight infection. Because these people have difficulty fighting infections, the fungus that causes thrush can spread throughout the body. This can cause life-threatening blood or organ infections. CAUSES  Ritta Slot is usually caused by a yeast called Candida albicans. This fungus is normally present in small amounts in the mouth and on other mucous membranes. It usually causes no harm. However, when conditions are present that allow the fungus to grow uncontrolled, it invades surrounding tissues and becomes an infection. Less often, other Candida species can also lead to thrush.  RISK FACTORS Ritta Slot is more likely to develop in the following people:  People with an impaired ability to fight infection (weakened immune system).   Older adults.   People with HIV.   People with diabetes.   People with dry mouth (xerostomia).   Pregnant women.   People with poor dental care, especially those who have false teeth.   People who use antibiotic medicines.  SIGNS AND SYMPTOMS  Ritta Slot can be a mild infection that causes no symptoms. If symptoms develop, they may include:   A  burning feeling in the mouth and throat. This can occur at the start of a thrush infection.   White patches that adhere to the mouth and tongue. The tissue around the patches may be red, raw, and painful. If rubbed (during tooth brushing, for example), the patches and the tissue of the mouth may bleed easily.   A bad taste in the mouth or difficulty tasting foods.   Cottony feeling in the mouth.   Pain during eating and swallowing. DIAGNOSIS  Your health care provider can usually diagnose thrush by looking in your mouth and asking you questions about your health.  TREATMENT  Medicines that help prevent the growth of fungi (antifungals) are the standard treatment for thrush. These medicines are either applied directly to the affected area (topical) or swallowed (oral). The treatment will depend on the severity of the condition.  Mild Thrush Mild cases of thrush may clear up with the use of an antifungal mouth rinse or lozenges. Treatment usually lasts about 14 days.  Moderate to Severe Thrush  More severe thrush infections that have spread to the esophagus are treated with an oral antifungal medicine. A topical antifungal medicine may also be used.   For some severe infections, a treatment period longer than 14 days may be needed.   Oral antifungal medicines are almost never used during pregnancy because the fetus may be harmed. However, if a pregnant woman has a rare, severe thrush infection that has spread to her blood, oral antifungal medicines may be used. In this case, the risk of harm  to the mother and fetus from the severe thrush infection may be greater than the risk posed by the use of antifungal medicines.  Persistent or Recurrent Thrush For cases of thrush that do not go away or keep coming back, treatment may involve the following:   Treatment may be needed twice as long as the symptoms last.   Treatment will include both oral and topical antifungal medicines.    People with weakened immune systems can take an antifungal medicine on a continuous basis to prevent thrush infections.  It is important to treat conditions that make you more likely to get thrush, such as diabetes or HIV.  HOME CARE INSTRUCTIONS   Only take over-the-counter or prescription medicine as directed by your health care provider. Talk to your health care provider about an over-the-counter medicine called gentian violet, which kills bacteria and fungi.   Eat plain, unflavored yogurt as directed by your health care provider. Check the label to make sure the yogurt contains live cultures. This yogurt can help healthy bacteria grow in the mouth that can stop the growth of the fungus that causes thrush.   Try these measures to help reduce the discomfort of thrush:   Drink cold liquids such as water or iced tea.   Try flavored ice treats or frozen juices.   Eat foods that are easy to swallow, such as gelatin, ice cream, or custard.   If the patches in your mouth are painful, try drinking from a straw.   Rinse your mouth several times a day with a warm saltwater rinse. You can make the saltwater mixture with 1 tsp (6 g) of salt in 8 fl oz (0.2 L) of warm water.   If you wear dentures, remove the dentures before going to bed, brush them vigorously, and soak them in a cleaning solution as directed by your health care provider.   Women who are breastfeeding should clean their nipples with an antifungal medicine as directed by their health care provider. Dry the nipples after breastfeeding. Applying lanolin-containing body lotion may help relieve nipple soreness.  SEEK MEDICAL CARE IF:  Your symptoms are getting worse or are not improving within 7 days of starting treatment.   You have symptoms of spreading infection, such as white patches on the skin outside of the mouth.   You are nursing and you have redness, burning, or pain in the nipples that is not relieved with  treatment.  MAKE SURE YOU:  Understand these instructions.  Will watch your condition.  Will get help right away if you are not doing well or get worse. Document Released: 11/28/2003 Document Revised: 12/23/2012 Document Reviewed: 10/05/2012 [facility] Patient Information 2014 [place].

## 2013-08-26 ENCOUNTER — Telehealth: Payer: Self-pay | Admitting: Family Medicine

## 2013-08-26 LAB — BMP8+EGFR
BUN / CREAT RATIO: 11 (ref 11–26)
BUN: 10 mg/dL (ref 8–27)
CO2: 35 mmol/L — ABNORMAL HIGH (ref 18–29)
Calcium: 9.7 mg/dL (ref 8.7–10.3)
Chloride: 90 mmol/L — ABNORMAL LOW (ref 97–108)
Creatinine, Ser: 0.87 mg/dL (ref 0.57–1.00)
GFR calc non Af Amer: 68 mL/min/{1.73_m2} (ref >59)
GFR, EST AFRICAN AMERICAN: 79 mL/min/{1.73_m2} (ref >59)
GLUCOSE: 92 mg/dL (ref 65–99)
Potassium: 3.6 mmol/L (ref 3.5–5.2)
Sodium: 145 mmol/L — ABNORMAL HIGH (ref 134–144)

## 2013-08-26 NOTE — Telephone Encounter (Signed)
Message copied by Waverly Ferrari on Thu Aug 26, 2013  3:13 PM ------      Message from: Chipper Herb      Created: Thu Aug 26, 2013  7:41 AM       The blood sugar is good at 92 the creatinine, the most important kidney function test is within normal limits. The sodium is slightly elevated but this is okay. The chloride is also slightly decreased and this is consistent with past readings. The CO2 is high but not as high as it has been in the past.      The throat culture is still pending ------

## 2013-08-27 ENCOUNTER — Telehealth: Payer: Self-pay | Admitting: Family Medicine

## 2013-08-27 LAB — STREP A CULTURE, THROAT: STREP A CULTURE: NEGATIVE

## 2013-08-30 ENCOUNTER — Telehealth: Payer: Self-pay | Admitting: Family Medicine

## 2013-08-30 NOTE — Telephone Encounter (Signed)
Patient aware in result notes

## 2013-09-02 ENCOUNTER — Other Ambulatory Visit: Payer: Self-pay | Admitting: Nurse Practitioner

## 2013-09-03 ENCOUNTER — Telehealth: Payer: Self-pay | Admitting: Nurse Practitioner

## 2013-09-04 MED ORDER — AZITHROMYCIN 250 MG PO TABS: ORAL_TABLET | ORAL | Status: DC

## 2013-09-04 NOTE — Telephone Encounter (Signed)
zpak sent to pharmacy 

## 2013-09-06 ENCOUNTER — Other Ambulatory Visit: Payer: Self-pay | Admitting: Nurse Practitioner

## 2013-09-06 NOTE — Telephone Encounter (Signed)
Taken care of in another encounter 

## 2013-09-07 ENCOUNTER — Telehealth: Payer: Self-pay | Admitting: Nurse Practitioner

## 2013-09-08 MED ORDER — ALPRAZOLAM 0.5 MG PO TABS: 0.5000 mg | ORAL_TABLET | Freq: Two times a day (BID) | ORAL | Status: DC

## 2013-09-08 MED ORDER — ALBUTEROL SULFATE (2.5 MG/3ML) 0.083% IN NEBU: INHALATION_SOLUTION | RESPIRATORY_TRACT | Status: DC

## 2013-09-08 NOTE — Telephone Encounter (Signed)
Please call in xaxax 0.5 1 po BID #[id number] with 0 refills-[facility] [place]

## 2013-09-14 DIAGNOSIS — I509 Heart failure, unspecified: Secondary | ICD-10-CM

## 2013-09-14 DIAGNOSIS — I10 Essential (primary) hypertension: Secondary | ICD-10-CM

## 2013-09-14 DIAGNOSIS — J441 Chronic obstructive pulmonary disease with (acute) exacerbation: Secondary | ICD-10-CM

## 2013-09-14 DIAGNOSIS — J189 Pneumonia, unspecified organism: Secondary | ICD-10-CM

## 2013-09-15 ENCOUNTER — Other Ambulatory Visit: Payer: Self-pay | Admitting: *Deleted

## 2013-09-15 MED ORDER — ALBUTEROL SULFATE (2.5 MG/3ML) 0.083% IN NEBU: INHALATION_SOLUTION | RESPIRATORY_TRACT | Status: DC

## 2013-09-20 ENCOUNTER — Other Ambulatory Visit: Payer: Self-pay | Admitting: Nurse Practitioner

## 2013-10-06 ENCOUNTER — Telehealth: Payer: Self-pay | Admitting: Nurse Practitioner

## 2013-10-06 MED ORDER — HYDROCODONE-ACETAMINOPHEN 10-325 MG PO TABS: 1.0000 | ORAL_TABLET | Freq: Two times a day (BID) | ORAL | Status: DC

## 2013-10-06 NOTE — Telephone Encounter (Signed)
rx ready for pick up Cannot do pain meds for 90 days

## 2013-10-07 NOTE — Telephone Encounter (Signed)
notified that script is available to be picked up with photo ID at front desk

## 2013-11-03 ENCOUNTER — Telehealth: Payer: Self-pay | Admitting: Nurse Practitioner

## 2013-11-03 DIAGNOSIS — F32A Depression, unspecified: Secondary | ICD-10-CM

## 2013-11-03 DIAGNOSIS — F329 Major depressive disorder, single episode, unspecified: Secondary | ICD-10-CM

## 2013-11-03 MED ORDER — BUPROPION HCL ER (XL) 150 MG PO TB24: 150.0000 mg | ORAL_TABLET | Freq: Every day | ORAL | Status: DC

## 2013-11-03 NOTE — Telephone Encounter (Signed)
rx sent to pharmacy

## 2013-11-08 ENCOUNTER — Telehealth: Payer: Self-pay | Admitting: Nurse Practitioner

## 2013-11-15 ENCOUNTER — Telehealth: Payer: Self-pay | Admitting: Nurse Practitioner

## 2013-11-15 MED ORDER — TIOTROPIUM BROMIDE MONOHYDRATE 18 MCG IN CAPS
18.0000 ug | ORAL_CAPSULE | Freq: Every morning | RESPIRATORY_TRACT | Status: DC
Start: 2013-11-15 — End: 2013-11-15

## 2013-11-15 MED ORDER — TIOTROPIUM BROMIDE MONOHYDRATE 18 MCG IN CAPS
18.0000 ug | ORAL_CAPSULE | Freq: Every morning | RESPIRATORY_TRACT | Status: DC
Start: 2013-11-15 — End: 2014-04-07

## 2013-11-15 NOTE — Telephone Encounter (Signed)
Script printed. Will fax once signed.

## 2013-11-15 NOTE — Telephone Encounter (Signed)
Okay  To fill spiriva and fax to number listed

## 2013-11-15 NOTE — Telephone Encounter (Signed)
Faxed per request.

## 2013-11-17 ENCOUNTER — Telehealth: Payer: Self-pay | Admitting: Family Medicine

## 2013-11-26 NOTE — Telephone Encounter (Signed)
Faxed rx to this number

## 2013-12-20 ENCOUNTER — Telehealth: Payer: Self-pay | Admitting: Nurse Practitioner

## 2013-12-20 NOTE — Telephone Encounter (Signed)
Just has to do with poor circulation- elevate legs when sitting and wear compression hose if have some.

## 2013-12-21 NOTE — Telephone Encounter (Signed)
Left message with husband she told hime to take a message she was sleeping

## 2013-12-29 ENCOUNTER — Telehealth: Payer: Self-pay | Admitting: Nurse Practitioner

## 2013-12-30 MED ORDER — HYDROCODONE-ACETAMINOPHEN 10-325 MG PO TABS
1.0000 | ORAL_TABLET | Freq: Two times a day (BID) | ORAL | Status: DC
Start: 2013-12-30 — End: 2014-03-04

## 2013-12-30 NOTE — Telephone Encounter (Signed)
Patient aware.  Put note on script that she would only write for #60.

## 2013-12-30 NOTE — Telephone Encounter (Signed)
X ready for pick up buttt will only do #60

## 2014-01-04 ENCOUNTER — Telehealth: Payer: Self-pay | Admitting: Family Medicine

## 2014-01-04 NOTE — Telephone Encounter (Signed)
Appt given for tomorrow

## 2014-01-05 ENCOUNTER — Ambulatory Visit (INDEPENDENT_AMBULATORY_CARE_PROVIDER_SITE_OTHER): Payer: Medicare HMO | Admitting: Nurse Practitioner

## 2014-01-05 ENCOUNTER — Encounter: Payer: Self-pay | Admitting: Nurse Practitioner

## 2014-01-05 VITALS — BP 118/72 | HR 103 | Temp 98.3°F | Ht 58.5 in | Wt 115.4 lb

## 2014-01-05 DIAGNOSIS — IMO0001 Reserved for inherently not codable concepts without codable children: Secondary | ICD-10-CM

## 2014-01-05 DIAGNOSIS — D649 Anemia, unspecified: Secondary | ICD-10-CM

## 2014-01-05 DIAGNOSIS — I509 Heart failure, unspecified: Secondary | ICD-10-CM

## 2014-01-05 DIAGNOSIS — J449 Chronic obstructive pulmonary disease, unspecified: Secondary | ICD-10-CM

## 2014-01-05 DIAGNOSIS — M069 Rheumatoid arthritis, unspecified: Secondary | ICD-10-CM

## 2014-01-05 DIAGNOSIS — G4700 Insomnia, unspecified: Secondary | ICD-10-CM

## 2014-01-05 DIAGNOSIS — Z23 Encounter for immunization: Secondary | ICD-10-CM

## 2014-01-05 DIAGNOSIS — E785 Hyperlipidemia, unspecified: Secondary | ICD-10-CM

## 2014-01-05 DIAGNOSIS — I10 Essential (primary) hypertension: Secondary | ICD-10-CM

## 2014-01-05 DIAGNOSIS — M8580 Other specified disorders of bone density and structure, unspecified site: Secondary | ICD-10-CM
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
## 2014-01-05 NOTE — Patient Instructions (Signed)

## 2014-01-06 ENCOUNTER — Telehealth: Payer: Self-pay | Admitting: Nurse Practitioner

## 2014-01-06 NOTE — Telephone Encounter (Signed)
Patient aware we have the paper work to fax.

## 2014-01-25 ENCOUNTER — Telehealth: Payer: Self-pay | Admitting: *Deleted

## 2014-01-25 NOTE — Telephone Encounter (Signed)
Shouldn't hurt anything

## 2014-01-25 NOTE — Telephone Encounter (Signed)
Patient states that she swallowed the plastic capsule that goes in her spiriva inhaler. What does she need to do

## 2014-01-25 NOTE — Telephone Encounter (Signed)
Patient aware.

## 2014-01-28 ENCOUNTER — Telehealth: Payer: Self-pay | Admitting: Nurse Practitioner

## 2014-01-31 NOTE — Telephone Encounter (Signed)
Wear compression stockings and can get them otc at pharmacy like [facility]

## 2014-01-31 NOTE — Telephone Encounter (Signed)
Aware.  If no improvement will ntbs.

## 2014-02-02 ENCOUNTER — Other Ambulatory Visit: Payer: Self-pay | Admitting: Nurse Practitioner

## 2014-02-11 ENCOUNTER — Other Ambulatory Visit: Payer: Self-pay | Admitting: Nurse Practitioner

## 2014-02-14 MED ORDER — ALPRAZOLAM 0.5 MG PO TABS: 0.5000 mg | ORAL_TABLET | Freq: Two times a day (BID) | ORAL | Status: DC

## 2014-02-14 NOTE — Telephone Encounter (Signed)
xanax rx ready for pick up

## 2014-02-14 NOTE — Telephone Encounter (Signed)
Aware, patient xanax script is ready.

## 2014-02-17 ENCOUNTER — Telehealth: Payer: Self-pay | Admitting: Nurse Practitioner

## 2014-02-18 NOTE — Telephone Encounter (Signed)
Stp and she uses 2 vials a day of the albuterol in the nebulizer, the original rx that was sent over would only be a 2 week supply. Called [employer] and verbal order for 180 vials to be dispensed so the pt receives 90 day supply. Pt is aware to call [employer] in a few days to make sure it has been shipped. Will close call.

## 2014-03-04 ENCOUNTER — Other Ambulatory Visit: Payer: Self-pay | Admitting: Nurse Practitioner

## 2014-03-04 MED ORDER — HYDROCODONE-ACETAMINOPHEN 10-325 MG PO TABS: 1.0000 | ORAL_TABLET | Freq: Two times a day (BID) | ORAL | Status: DC

## 2014-03-04 NOTE — Telephone Encounter (Signed)
Patient aware rx ready to be picked up 

## 2014-03-04 NOTE — Telephone Encounter (Signed)
hyrocodone rx ready for pick up

## 2014-03-31 ENCOUNTER — Emergency Department (HOSPITAL_COMMUNITY): Payer: Medicare Other

## 2014-03-31 ENCOUNTER — Encounter (HOSPITAL_COMMUNITY): Payer: Self-pay | Admitting: *Deleted

## 2014-03-31 ENCOUNTER — Emergency Department (HOSPITAL_COMMUNITY)
Admission: EM | Admit: 2014-03-31 | Discharge: 2014-03-31 | Disposition: A | Discharge status: Home / Self Care | Payer: Medicare Other | Attending: Emergency Medicine | Admitting: Emergency Medicine

## 2014-03-31 DIAGNOSIS — Z8639 Personal history of other endocrine, nutritional and metabolic disease: Secondary | ICD-10-CM | POA: Diagnosis not present

## 2014-03-31 DIAGNOSIS — S81011A Laceration without foreign body, right knee, initial encounter: Secondary | ICD-10-CM | POA: Diagnosis not present

## 2014-03-31 DIAGNOSIS — L409 Psoriasis, unspecified: Secondary | ICD-10-CM | POA: Diagnosis not present

## 2014-03-31 DIAGNOSIS — I10 Essential (primary) hypertension: Secondary | ICD-10-CM | POA: Diagnosis not present

## 2014-03-31 DIAGNOSIS — S4992XA Unspecified injury of left shoulder and upper arm, initial encounter: Secondary | ICD-10-CM | POA: Diagnosis not present

## 2014-03-31 DIAGNOSIS — S299XXA Unspecified injury of thorax, initial encounter: Secondary | ICD-10-CM | POA: Diagnosis not present

## 2014-03-31 DIAGNOSIS — S0181XA Laceration without foreign body of other part of head, initial encounter: Secondary | ICD-10-CM | POA: Diagnosis not present

## 2014-03-31 DIAGNOSIS — S0101XA Laceration without foreign body of scalp, initial encounter: Secondary | ICD-10-CM

## 2014-03-31 DIAGNOSIS — S41112A Laceration without foreign body of left upper arm, initial encounter: Secondary | ICD-10-CM | POA: Insufficient documentation

## 2014-03-31 DIAGNOSIS — S51012A Laceration without foreign body of left elbow, initial encounter: Secondary | ICD-10-CM | POA: Diagnosis not present

## 2014-03-31 DIAGNOSIS — S3993XA Unspecified injury of pelvis, initial encounter: Secondary | ICD-10-CM | POA: Diagnosis not present

## 2014-03-31 DIAGNOSIS — Z7982 Long term (current) use of aspirin: Secondary | ICD-10-CM | POA: Insufficient documentation

## 2014-03-31 DIAGNOSIS — Y9389 Activity, other specified: Secondary | ICD-10-CM | POA: Insufficient documentation

## 2014-03-31 DIAGNOSIS — S20212A Contusion of left front wall of thorax, initial encounter: Secondary | ICD-10-CM

## 2014-03-31 DIAGNOSIS — Y92410 Unspecified street and highway as the place of occurrence of the external cause: Secondary | ICD-10-CM | POA: Diagnosis not present

## 2014-03-31 DIAGNOSIS — S81812A Laceration without foreign body, left lower leg, initial encounter: Secondary | ICD-10-CM | POA: Insufficient documentation

## 2014-03-31 DIAGNOSIS — Z79899 Other long term (current) drug therapy: Secondary | ICD-10-CM | POA: Insufficient documentation

## 2014-03-31 DIAGNOSIS — Y998 Other external cause status: Secondary | ICD-10-CM | POA: Insufficient documentation

## 2014-03-31 DIAGNOSIS — J449 Chronic obstructive pulmonary disease, unspecified: Secondary | ICD-10-CM | POA: Insufficient documentation

## 2014-03-31 DIAGNOSIS — S199XXA Unspecified injury of neck, initial encounter: Secondary | ICD-10-CM | POA: Diagnosis not present

## 2014-03-31 DIAGNOSIS — S3991XA Unspecified injury of abdomen, initial encounter: Secondary | ICD-10-CM | POA: Diagnosis not present

## 2014-03-31 DIAGNOSIS — F419 Anxiety disorder, unspecified: Secondary | ICD-10-CM | POA: Insufficient documentation

## 2014-03-31 DIAGNOSIS — M79602 Pain in left arm: Secondary | ICD-10-CM | POA: Diagnosis not present

## 2014-03-31 DIAGNOSIS — S81012A Laceration without foreign body, left knee, initial encounter: Secondary | ICD-10-CM | POA: Diagnosis not present

## 2014-03-31 DIAGNOSIS — S2001XA Contusion of right breast, initial encounter: Secondary | ICD-10-CM

## 2014-03-31 DIAGNOSIS — T148 Other injury of unspecified body region: Secondary | ICD-10-CM | POA: Diagnosis not present

## 2014-03-31 DIAGNOSIS — M79642 Pain in left hand: Secondary | ICD-10-CM | POA: Diagnosis not present

## 2014-03-31 DIAGNOSIS — M7989 Other specified soft tissue disorders: Secondary | ICD-10-CM | POA: Diagnosis not present

## 2014-03-31 DIAGNOSIS — S2020XA Contusion of thorax, unspecified, initial encounter: Secondary | ICD-10-CM | POA: Insufficient documentation

## 2014-03-31 DIAGNOSIS — Z87891 Personal history of nicotine dependence: Secondary | ICD-10-CM | POA: Diagnosis not present

## 2014-03-31 DIAGNOSIS — S5012XA Contusion of left forearm, initial encounter: Secondary | ICD-10-CM | POA: Diagnosis not present

## 2014-03-31 DIAGNOSIS — S6992XA Unspecified injury of left wrist, hand and finger(s), initial encounter: Secondary | ICD-10-CM | POA: Diagnosis not present

## 2014-03-31 DIAGNOSIS — M25532 Pain in left wrist: Secondary | ICD-10-CM | POA: Diagnosis not present

## 2014-03-31 DIAGNOSIS — J439 Emphysema, unspecified: Secondary | ICD-10-CM | POA: Diagnosis not present

## 2014-03-31 LAB — COMPREHENSIVE METABOLIC PANEL
ALBUMIN: 3.3 g/dL — AB (ref 3.5–5.2)
ALT: 12 U/L (ref 0–35)
AST: 30 U/L (ref 0–37)
Alkaline Phosphatase: 40 U/L (ref 39–117)
Anion gap: 10 (ref 5–15)
BUN: 6 mg/dL (ref 6–23)
CALCIUM: 9.3 mg/dL (ref 8.4–10.5)
CO2: 44 mmol/L — AB (ref 19–32)
Chloride: 91 mEq/L — ABNORMAL LOW (ref 96–112)
Creatinine, Ser: 0.82 mg/dL (ref 0.50–1.10)
GFR, EST AFRICAN AMERICAN: 83 mL/min — AB (ref >90)
GFR, EST NON AFRICAN AMERICAN: 71 mL/min — AB (ref >90)
Glucose, Bld: 118 mg/dL — ABNORMAL HIGH (ref 70–99)
Potassium: 2.8 mmol/L — ABNORMAL LOW (ref 3.5–5.1)
Sodium: 145 mmol/L (ref 135–145)
Total Bilirubin: 0.2 mg/dL — ABNORMAL LOW (ref 0.3–1.2)
Total Protein: 6 g/dL (ref 6.0–8.3)

## 2014-03-31 LAB — PROTIME-INR
INR: 0.96 (ref 0.00–1.49)
PROTHROMBIN TIME: 12.9 s (ref 11.6–15.2)

## 2014-03-31 LAB — CBC
HCT: 34.1 % — ABNORMAL LOW (ref 36.0–46.0)
Hemoglobin: 10.4 g/dL — ABNORMAL LOW (ref 12.0–15.0)
MCH: 32.2 pg (ref 26.0–34.0)
MCHC: 30.5 g/dL (ref 30.0–36.0)
MCV: 105.6 fL — AB (ref 78.0–100.0)
Platelets: 167 10*3/uL (ref 150–400)
RBC: 3.23 MIL/uL — ABNORMAL LOW (ref 3.87–5.11)
RDW: 13.1 % (ref 11.5–15.5)
WBC: 5.3 10*3/uL (ref 4.0–10.5)

## 2014-03-31 LAB — SAMPLE TO BLOOD BANK

## 2014-03-31 LAB — CDS SEROLOGY

## 2014-03-31 LAB — ETHANOL

## 2014-03-31 MED ORDER — HYDROCODONE-ACETAMINOPHEN 5-325 MG PO TABS: 2.0000 | ORAL_TABLET | ORAL | Status: DC | PRN

## 2014-03-31 MED ORDER — MORPHINE SULFATE 2 MG/ML IJ SOLN
INTRAMUSCULAR | Status: AC
  Administered 2014-03-31: 4 mg via INTRAVENOUS
  Filled 2014-03-31: qty 2

## 2014-03-31 MED ORDER — TETANUS-DIPHTH-ACELL PERTUSSIS 5-2.5-18.5 LF-MCG/0.5 IM SUSP
0.5000 mL | Freq: Once | INTRAMUSCULAR | Status: AC
  Administered 2014-03-31: 0.5 mL via INTRAMUSCULAR

## 2014-03-31 MED ORDER — POTASSIUM CHLORIDE CRYS ER 20 MEQ PO TBCR: 40.0000 meq | EXTENDED_RELEASE_TABLET | Freq: Two times a day (BID) | ORAL | Status: DC

## 2014-03-31 MED ORDER — SODIUM CHLORIDE 0.9 % IV BOLUS (SEPSIS)
500.0000 mL | Freq: Once | INTRAVENOUS | Status: AC
  Administered 2014-03-31: 500 mL via INTRAVENOUS

## 2014-03-31 MED ORDER — TETANUS-DIPHTH-ACELL PERTUSSIS 5-2.5-18.5 LF-MCG/0.5 IM SUSP
INTRAMUSCULAR | Status: AC
  Administered 2014-03-31: 0.5 mL via INTRAMUSCULAR
  Filled 2014-03-31: qty 0.5

## 2014-03-31 MED ORDER — LIDOCAINE-EPINEPHRINE 1 %-1:100000 IJ SOLN
20.0000 mL | Freq: Once | INTRAMUSCULAR | Status: AC
  Administered 2014-03-31: 20 mL via INTRADERMAL
  Filled 2014-03-31: qty 1

## 2014-03-31 MED ORDER — IOHEXOL 300 MG/ML  SOLN: 80.0000 mL | Freq: Once | INTRAMUSCULAR | Status: DC | PRN

## 2014-03-31 MED ORDER — MORPHINE SULFATE 4 MG/ML IJ SOLN: 4.0000 mg | Freq: Once | INTRAMUSCULAR | Status: AC

## 2014-03-31 MED ORDER — POTASSIUM CHLORIDE CRYS ER 20 MEQ PO TBCR
40.0000 meq | EXTENDED_RELEASE_TABLET | Freq: Once | ORAL | Status: AC
  Administered 2014-03-31: 40 meq via ORAL
  Filled 2014-03-31: qty 2
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
## 2014-03-31 NOTE — ED Notes (Signed)
Performed in and out cath with assistance from [place]. pts urine specimen at bedside.
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
## 2014-03-31 NOTE — ED Notes (Signed)
MD suturing at bedside.

## 2014-03-31 NOTE — ED Notes (Signed)
MD at bedside. 

## 2014-04-04 ENCOUNTER — Telehealth: Payer: Self-pay | Admitting: Nurse Practitioner

## 2014-04-04 NOTE — Telephone Encounter (Signed)
Appointment given for Thursday at 2:00 with Ronnald Collum, FNP.

## 2014-04-07 ENCOUNTER — Ambulatory Visit (INDEPENDENT_AMBULATORY_CARE_PROVIDER_SITE_OTHER): Payer: Medicare Other | Admitting: Nurse Practitioner

## 2014-04-07 ENCOUNTER — Encounter: Payer: Self-pay | Admitting: Nurse Practitioner

## 2014-04-07 VITALS — BP 139/63 | HR 92 | Temp 96.8°F | Ht 59.0 in | Wt 112.0 lb

## 2014-04-07 DIAGNOSIS — S060X9D Concussion with loss of consciousness of unspecified duration, subsequent encounter: Secondary | ICD-10-CM

## 2014-04-07 DIAGNOSIS — Z4802 Encounter for removal of sutures: Secondary | ICD-10-CM | POA: Diagnosis not present

## 2014-04-07 MED ORDER — TIOTROPIUM BROMIDE MONOHYDRATE 18 MCG IN CAPS: 18.0000 ug | ORAL_CAPSULE | Freq: Every morning | RESPIRATORY_TRACT | Status: DC

## 2014-04-07 NOTE — Addendum Note (Signed)
Addended by: Chevis Pretty on: 04/07/2014 02:24 PM   Modules accepted: Orders

## 2014-04-07 NOTE — Addendum Note (Signed)
Addended by: Chevis Pretty on: 04/07/2014 02:26 PM   Modules accepted: Orders
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
## 2014-04-07 NOTE — Patient Instructions (Signed)
Facial Laceration  A facial laceration is a cut on the face. These injuries can be painful and cause bleeding. Lacerations usually heal quickly, but they need special care to reduce scarring. DIAGNOSIS  Your health care provider will take a medical history, ask for details about how the injury occurred, and examine the wound to determine how deep the cut is. TREATMENT  Some facial lacerations may not require closure. Others may not be able to be closed because of an increased risk of infection. The risk of infection and the chance for successful closure will depend on various factors, including the amount of time since the injury occurred. The wound may be cleaned to help prevent infection. If closure is appropriate, pain medicines may be given if needed. Your health care provider will use stitches (sutures), wound glue (adhesive), or skin adhesive strips to repair the laceration. These tools bring the skin edges together to allow for faster healing and a better cosmetic outcome. If needed, you may also be given a tetanus shot. HOME CARE INSTRUCTIONS  Only take over-the-counter or prescription medicines as directed by your health care provider.  Follow your health care provider's instructions for wound care. These instructions will vary depending on the technique used for closing the wound. For Sutures:  Keep the wound clean and dry.   If you were given a bandage (dressing), you should change it at least once a day. Also change the dressing if it becomes wet or dirty, or as directed by your health care provider.   Wash the wound with soap and water 2 times a day. Rinse the wound off with water to remove all soap. Pat the wound dry with a clean towel.   After cleaning, apply a thin layer of the antibiotic ointment recommended by your health care provider. This will help prevent infection and keep the dressing from sticking.   You may shower as usual after the first 24 hours. Do not soak the  wound in water until the sutures are removed.   Get your sutures removed as directed by your health care provider. With facial lacerations, sutures should usually be taken out after 4-5 days to avoid stitch marks.   Wait a few days after your sutures are removed before applying any makeup. For Skin Adhesive Strips:  Keep the wound clean and dry.   Do not get the skin adhesive strips wet. You may bathe carefully, using caution to keep the wound dry.   If the wound gets wet, pat it dry with a clean towel.   Skin adhesive strips will fall off on their own. You may trim the strips as the wound heals. Do not remove skin adhesive strips that are still stuck to the wound. They will fall off in time.  For Wound Adhesive:  You may briefly wet your wound in the shower or bath. Do not soak or scrub the wound. Do not swim. Avoid periods of heavy sweating until the skin adhesive has fallen off on its own. After showering or bathing, gently pat the wound dry with a clean towel.   Do not apply liquid medicine, cream medicine, ointment medicine, or makeup to your wound while the skin adhesive is in place. This may loosen the film before your wound is healed.   If a dressing is placed over the wound, be careful not to apply tape directly over the skin adhesive. This may cause the adhesive to be pulled off before the wound is healed.   Avoid   prolonged exposure to sunlight or tanning lamps while the skin adhesive is in place.  The skin adhesive will usually remain in place for 5-10 days, then naturally fall off the skin. Do not pick at the adhesive film.  After Healing: Once the wound has healed, cover the wound with sunscreen during the day for 1 full year. This can help minimize scarring. Exposure to ultraviolet light in the first year will darken the scar. It can take 1-2 years for the scar to lose its redness and to heal completely.  SEEK IMMEDIATE MEDICAL CARE IF:  You have redness, pain, or  swelling around the wound.   You see ayellowish-white fluid (pus) coming from the wound.   You have chills or a fever.  MAKE SURE YOU:  Understand these instructions.  Will watch your condition.  Will get help right away if you are not doing well or get worse. Document Released: 04/11/2004 Document Revised: 12/23/2012 Document Reviewed: 10/15/2012 Exit[employer] Patient Information 2015 [employer]. This information is not intended to replace advice given to you by your health care provider. Make sure you discuss any questions you have with your health care provider.  

## 2014-04-12 ENCOUNTER — Telehealth: Payer: Self-pay | Admitting: Nurse Practitioner

## 2014-04-12 NOTE — Telephone Encounter (Signed)
Appointment scheduled for Thursday at 1

## 2014-04-14 ENCOUNTER — Encounter: Payer: Self-pay | Admitting: Nurse Practitioner

## 2014-04-14 ENCOUNTER — Ambulatory Visit (INDEPENDENT_AMBULATORY_CARE_PROVIDER_SITE_OTHER): Payer: Medicare Other | Admitting: Nurse Practitioner

## 2014-04-14 VITALS — BP 126/84 | HR 88 | Temp 97.7°F | Ht 59.0 in | Wt 111.0 lb

## 2014-04-14 DIAGNOSIS — S81802D Unspecified open wound, left lower leg, subsequent encounter: Secondary | ICD-10-CM | POA: Diagnosis not present

## 2014-04-14 DIAGNOSIS — Z4802 Encounter for removal of sutures: Secondary | ICD-10-CM | POA: Diagnosis not present
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
## 2014-04-14 NOTE — Patient Instructions (Signed)
Sterile Tape Wound Care Some cuts and wounds can be closed using sterile tape, also called skin adhesive strips. Skin adhesive strips can be used for shallow (superficial) and simple cuts, wounds, lacerations, and surgical incisions. These strips act in place of stitches to hold the edges of the wound together, allowing for faster healing. Unlike stitches, the adhesive strips do not require needles or anesthetic medicine for placement. The strips will wear off naturally as the wound is healing. It is important to take proper care of your wound at home while it heals.  HOME CARE INSTRUCTIONS  Try to keep the area around your wound clean and dry. Do not allow the adhesive strips to get wet for the first 12 hours.   Do not use any soaps or ointments on the wound for the first 12 hours.   If a bandage (dressing) has been applied, follow your health care provider's instructions for how often to change the dressing. Keep the dressing dry if one has been applied.   Do not remove the adhesive strips. They will fall off on their own. If they do not, you may remove them gently after 10 days. You should gently wet the strips before removing them. For example, this can be done in the shower.  Do not scratch, pick, or rub the wound area.   Protect the wound from further injury until it is healed.   Protect the wound from sun and tanning bed exposure while it is healing and for several weeks after healing.   Only take over-the-counter or prescription medicines as directed by your health care provider.   Keep all follow-up appointments as directed by your health care provider.  SEEK MEDICAL CARE IF: Your adhesive strips become wet or soaked with blood before the wound has healed. The tape will need to be replaced.  SEEK IMMEDIATE MEDICAL CARE IF:  You have increasing pain in the wound.   You develop a rash after the strips are applied.  Your wound becomes red, swollen, hot, or tender.   You  have a red streak that goes away from the wound.   You have pus coming from the wound.   You have increased bleeding from the wound.  You notice a bad smell coming from the wound.   Your wound breaks open. MAKE SURE YOU:  Understand these instructions.  Will watch your condition.  Will get help right away if you are not doing well or get worse. Document Released: 04/11/2004 Document Revised: 12/23/2012 Document Reviewed: 09/23/2012 [facility] Patient Information 2015 [place], [place]. This information is not intended to replace advice given to you by your health care provider. Make sure you discuss any questions you have with your health care provider.

## 2014-04-22 ENCOUNTER — Telehealth: Payer: Self-pay | Admitting: Nurse Practitioner

## 2014-04-22 MED ORDER — AZITHROMYCIN 250 MG PO TABS: ORAL_TABLET | ORAL | Status: DC

## 2014-04-22 NOTE — Telephone Encounter (Signed)
rx called in

## 2014-04-25 ENCOUNTER — Telehealth: Payer: Self-pay | Admitting: Nurse Practitioner

## 2014-04-25 MED ORDER — AZITHROMYCIN 250 MG PO TABS: ORAL_TABLET | ORAL | Status: DC

## 2014-04-25 NOTE — Telephone Encounter (Signed)
Medication sent to [facility].

## 2014-04-27 ENCOUNTER — Other Ambulatory Visit: Payer: Self-pay | Admitting: Nurse Practitioner

## 2014-04-27 MED ORDER — AZITHROMYCIN 250 MG PO TABS: ORAL_TABLET | ORAL | Status: DC

## 2014-05-04 ENCOUNTER — Telehealth: Payer: Self-pay | Admitting: Nurse Practitioner

## 2014-05-05 NOTE — Telephone Encounter (Signed)
Spoke with pt, She will call company and get forms, we do not have. We have her husbands and have taken care of them.

## 2014-05-05 NOTE — Telephone Encounter (Signed)
Has this form been done? Med assiatance

## 2014-05-06 ENCOUNTER — Other Ambulatory Visit: Payer: Self-pay | Admitting: *Deleted

## 2014-05-06 MED ORDER — AMITRIPTYLINE HCL 50 MG PO TABS: 50.0000 mg | ORAL_TABLET | Freq: Every day | ORAL | Status: DC

## 2014-05-06 MED ORDER — FUROSEMIDE 20 MG PO TABS: 20.0000 mg | ORAL_TABLET | Freq: Every day | ORAL | Status: DC

## 2014-05-06 MED ORDER — METOPROLOL TARTRATE 50 MG PO TABS: 50.0000 mg | ORAL_TABLET | Freq: Two times a day (BID) | ORAL | Status: DC

## 2014-05-06 MED ORDER — ALBUTEROL SULFATE (2.5 MG/3ML) 0.083% IN NEBU: INHALATION_SOLUTION | RESPIRATORY_TRACT | Status: DC

## 2014-05-25 ENCOUNTER — Encounter: Payer: Self-pay | Admitting: Nurse Practitioner

## 2014-05-25 ENCOUNTER — Ambulatory Visit (INDEPENDENT_AMBULATORY_CARE_PROVIDER_SITE_OTHER): Payer: Medicare Other | Admitting: Nurse Practitioner

## 2014-05-25 ENCOUNTER — Ambulatory Visit (INDEPENDENT_AMBULATORY_CARE_PROVIDER_SITE_OTHER): Payer: Medicare Other

## 2014-05-25 VITALS — BP 128/66 | HR 76 | Temp 97.0°F | Ht 59.0 in | Wt 110.0 lb

## 2014-05-25 DIAGNOSIS — I739 Peripheral vascular disease, unspecified: Secondary | ICD-10-CM

## 2014-05-25 DIAGNOSIS — M79672 Pain in left foot: Secondary | ICD-10-CM
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
## 2014-05-25 NOTE — Addendum Note (Signed)
Addended by: Chevis Pretty on: 05/25/2014 01:51 PM   Modules accepted: Orders

## 2014-05-25 NOTE — Patient Instructions (Signed)

## 2014-05-26 ENCOUNTER — Telehealth: Payer: Self-pay | Admitting: Nurse Practitioner

## 2014-05-26 NOTE — Telephone Encounter (Signed)
Stp and she was confused about where she was supposed to go. Advised we are referring her to a vascular surgeon and when we make the appt wither we will call her or the vascular surgeons office will call to go over all of the details. Pt voiced understanding.

## 2014-05-31 ENCOUNTER — Other Ambulatory Visit: Payer: Self-pay

## 2014-05-31 DIAGNOSIS — R209 Unspecified disturbances of skin sensation: Secondary | ICD-10-CM

## 2014-05-31 DIAGNOSIS — R0989 Other specified symptoms and signs involving the circulatory and respiratory systems: Secondary | ICD-10-CM

## 2014-05-31 DIAGNOSIS — L98499 Non-pressure chronic ulcer of skin of other sites with unspecified severity: Secondary | ICD-10-CM

## 2014-06-06 ENCOUNTER — Telehealth: Payer: Self-pay | Admitting: Nurse Practitioner

## 2014-06-08 ENCOUNTER — Encounter: Payer: Self-pay | Admitting: Vascular Surgery

## 2014-06-09 ENCOUNTER — Ambulatory Visit (INDEPENDENT_AMBULATORY_CARE_PROVIDER_SITE_OTHER)
Admission: RE | Admit: 2014-06-09 | Discharge: 2014-06-09 | Disposition: A | Discharge status: Home / Self Care | Payer: Medicare Other | Source: Ambulatory Visit | Attending: Vascular Surgery | Admitting: Vascular Surgery

## 2014-06-09 ENCOUNTER — Ambulatory Visit (INDEPENDENT_AMBULATORY_CARE_PROVIDER_SITE_OTHER): Payer: Medicare Other | Admitting: Vascular Surgery

## 2014-06-09 ENCOUNTER — Encounter: Payer: Self-pay | Admitting: Vascular Surgery

## 2014-06-09 ENCOUNTER — Ambulatory Visit (HOSPITAL_COMMUNITY)
Admission: RE | Admit: 2014-06-09 | Discharge: 2014-06-09 | Disposition: A | Discharge status: Home / Self Care | Payer: Medicare Other | Source: Ambulatory Visit | Attending: Vascular Surgery | Admitting: Vascular Surgery

## 2014-06-09 VITALS — BP 119/57 | HR 83 | Ht 59.0 in | Wt 110.0 lb

## 2014-06-09 DIAGNOSIS — R208 Other disturbances of skin sensation: Secondary | ICD-10-CM | POA: Diagnosis not present

## 2014-06-09 DIAGNOSIS — L98499 Non-pressure chronic ulcer of skin of other sites with unspecified severity: Secondary | ICD-10-CM

## 2014-06-09 DIAGNOSIS — I70219 Atherosclerosis of native arteries of extremities with intermittent claudication, unspecified extremity: Secondary | ICD-10-CM

## 2014-06-09 DIAGNOSIS — I739 Peripheral vascular disease, unspecified: Secondary | ICD-10-CM

## 2014-06-09 DIAGNOSIS — R209 Unspecified disturbances of skin sensation: Secondary | ICD-10-CM

## 2014-06-09 DIAGNOSIS — R0989 Other specified symptoms and signs involving the circulatory and respiratory systems: Secondary | ICD-10-CM

## 2014-06-09 NOTE — Addendum Note (Signed)
Addended by: Mena Goes on: 06/09/2014 05:03 PM   Modules accepted: Orders
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
## 2014-06-22 ENCOUNTER — Telehealth: Payer: Self-pay | Admitting: Nurse Practitioner

## 2014-06-24 DIAGNOSIS — Z0279 Encounter for issue of other medical certificate: Secondary | ICD-10-CM

## 2014-06-27 ENCOUNTER — Telehealth: Payer: Self-pay | Admitting: Nurse Practitioner

## 2014-06-27 NOTE — Telephone Encounter (Signed)
Papers faxed.

## 2014-07-14 ENCOUNTER — Other Ambulatory Visit: Payer: Self-pay | Admitting: Nurse Practitioner

## 2014-07-15 MED ORDER — TIOTROPIUM BROMIDE MONOHYDRATE 18 MCG IN CAPS: 18.0000 ug | ORAL_CAPSULE | Freq: Every morning | RESPIRATORY_TRACT | Status: DC

## 2014-07-15 NOTE — Telephone Encounter (Signed)
Called pt, she confirmed it was [facility] 07/15/14

## 2014-07-25 ENCOUNTER — Other Ambulatory Visit: Payer: Self-pay | Admitting: Nurse Practitioner

## 2014-07-27 ENCOUNTER — Telehealth: Payer: Self-pay | Admitting: Nurse Practitioner

## 2014-07-27 NOTE — Telephone Encounter (Signed)
Called pt assistance company for pt, they will ship tomorrow

## 2014-08-08 ENCOUNTER — Other Ambulatory Visit: Payer: Self-pay

## 2014-08-08 ENCOUNTER — Telehealth: Payer: Self-pay | Admitting: Nurse Practitioner

## 2014-08-08 MED ORDER — HYDROCODONE-ACETAMINOPHEN 10-325 MG PO TABS: 1.0000 | ORAL_TABLET | Freq: Two times a day (BID) | ORAL | Status: DC

## 2014-08-08 MED ORDER — ALPRAZOLAM 0.5 MG PO TABS: 0.5000 mg | ORAL_TABLET | Freq: Two times a day (BID) | ORAL | Status: DC

## 2014-08-08 NOTE — Telephone Encounter (Signed)
Hydrocodone rx ready for pick up no more refills without being seen

## 2014-08-08 NOTE — Telephone Encounter (Signed)
Patient aware that rx is ready to be picked up.  

## 2014-08-08 NOTE — Telephone Encounter (Signed)
Last seen 05/25/14 MMM   This is for mail order  If approved print for patient

## 2014-08-08 NOTE — Telephone Encounter (Signed)
Spoke with Ins Co to get tier review on RX They will fax results

## 2014-08-08 NOTE — Telephone Encounter (Signed)
Please call in xanax with 1 refills 

## 2014-08-09 NOTE — Telephone Encounter (Signed)
rx given to patient

## 2014-08-11 ENCOUNTER — Telehealth: Payer: Self-pay

## 2014-08-11 MED ORDER — OXYCODONE-ACETAMINOPHEN 10-325 MG PO TABS: 1.0000 | ORAL_TABLET | Freq: Three times a day (TID) | ORAL | Status: DC | PRN

## 2014-08-11 NOTE — Telephone Encounter (Signed)
Patient aware.

## 2014-08-11 NOTE — Telephone Encounter (Signed)
Insurance denied prior authorization for hydroco/APAP 10-325   Needs to try Oxycodone hydrochloride Hydromorphone hydrochloride or acetaminiphen tramadol

## 2014-08-11 NOTE — Telephone Encounter (Signed)
norco changed to percocet due to insurance- rx ready for pick up

## 2014-08-19 ENCOUNTER — Other Ambulatory Visit: Payer: Self-pay | Admitting: Nurse Practitioner

## 2014-08-19 MED ORDER — AZITHROMYCIN 250 MG PO TABS: ORAL_TABLET | ORAL | Status: DC

## 2014-08-22 ENCOUNTER — Other Ambulatory Visit: Payer: Self-pay | Admitting: Nurse Practitioner

## 2014-08-22 ENCOUNTER — Other Ambulatory Visit: Payer: Self-pay

## 2014-08-22 MED ORDER — ALBUTEROL SULFATE (2.5 MG/3ML) 0.083% IN NEBU: INHALATION_SOLUTION | RESPIRATORY_TRACT | Status: DC

## 2014-08-22 MED ORDER — FUROSEMIDE 20 MG PO TABS: 20.0000 mg | ORAL_TABLET | Freq: Every day | ORAL | Status: DC

## 2014-08-22 MED ORDER — METOPROLOL TARTRATE 50 MG PO TABS: 50.0000 mg | ORAL_TABLET | Freq: Two times a day (BID) | ORAL | Status: DC

## 2014-08-23 ENCOUNTER — Telehealth: Payer: Self-pay

## 2014-08-23 NOTE — Telephone Encounter (Signed)
x

## 2014-08-23 NOTE — Telephone Encounter (Signed)
Insurance prior authorized Hydrocodone/Acetaminophen from Aug 08, 2014 through March 08, 2015  [id number]

## 2014-09-09 ENCOUNTER — Encounter (HOSPITAL_COMMUNITY): Payer: Medicare Other

## 2014-09-09 ENCOUNTER — Ambulatory Visit: Payer: Medicare Other | Admitting: Vascular Surgery

## 2014-09-09 ENCOUNTER — Other Ambulatory Visit: Payer: Self-pay | Admitting: Nurse Practitioner

## 2014-09-09 NOTE — Telephone Encounter (Signed)
Last seen 05/25/14 MMM  This med not on EPIC list

## 2014-09-16 ENCOUNTER — Ambulatory Visit: Payer: Medicare Other | Admitting: Vascular Surgery

## 2014-09-16 ENCOUNTER — Encounter (HOSPITAL_COMMUNITY): Payer: Medicare Other

## 2014-09-23 ENCOUNTER — Encounter (HOSPITAL_COMMUNITY): Payer: Medicare Other

## 2014-09-23 ENCOUNTER — Ambulatory Visit: Payer: Medicare Other | Admitting: Vascular Surgery

## 2014-10-04 ENCOUNTER — Telehealth: Payer: Self-pay | Admitting: Nurse Practitioner

## 2014-10-04 NOTE — Telephone Encounter (Signed)
Deny z pak has not been seen since March 2016

## 2014-10-04 NOTE — Telephone Encounter (Signed)
Pt does not want appt at this time

## 2014-10-05 DIAGNOSIS — L409 Psoriasis, unspecified: Secondary | ICD-10-CM | POA: Diagnosis not present

## 2014-10-20 ENCOUNTER — Encounter: Payer: Self-pay | Admitting: Vascular Surgery

## 2014-10-21 ENCOUNTER — Ambulatory Visit: Payer: Medicare Other | Admitting: Vascular Surgery

## 2014-10-21 ENCOUNTER — Encounter (HOSPITAL_COMMUNITY): Payer: Medicare Other

## 2014-10-25 ENCOUNTER — Telehealth: Payer: Self-pay | Admitting: Nurse Practitioner

## 2014-10-25 NOTE — Telephone Encounter (Signed)
Just forwarding FYI message

## 2014-10-28 ENCOUNTER — Other Ambulatory Visit: Payer: Medicare Other

## 2014-10-28 DIAGNOSIS — Z79899 Other long term (current) drug therapy: Secondary | ICD-10-CM

## 2014-10-28 DIAGNOSIS — J441 Chronic obstructive pulmonary disease with (acute) exacerbation: Secondary | ICD-10-CM | POA: Diagnosis not present

## 2014-10-28 DIAGNOSIS — IMO0001 Reserved for inherently not codable concepts without codable children: Secondary | ICD-10-CM

## 2014-10-29 LAB — CBC WITH DIFFERENTIAL/PLATELET
BASOS: 0 %
Basophils Absolute: 0 10*3/uL (ref 0.0–0.2)
EOS (ABSOLUTE): 0.4 10*3/uL (ref 0.0–0.4)
Eos: 5 %
Hematocrit: 33.4 % — ABNORMAL LOW (ref 34.0–46.6)
Hemoglobin: 11 g/dL — ABNORMAL LOW (ref 11.1–15.9)
IMMATURE GRANULOCYTES: 0 %
Immature Grans (Abs): 0 10*3/uL (ref 0.0–0.1)
Lymphocytes Absolute: 2.9 10*3/uL (ref 0.7–3.1)
Lymphs: 29 %
MCH: 33.7 pg — AB (ref 26.6–33.0)
MCHC: 32.9 g/dL (ref 31.5–35.7)
MCV: 103 fL — ABNORMAL HIGH (ref 79–97)
MONOCYTES: 11 %
Monocytes Absolute: 1 10*3/uL — ABNORMAL HIGH (ref 0.1–0.9)
Neutrophils Absolute: 5.4 10*3/uL (ref 1.4–7.0)
Neutrophils: 55 %
Platelets: 270 10*3/uL (ref 150–379)
RBC: 3.26 x10E6/uL — AB (ref 3.77–5.28)
RDW: 14.3 % (ref 12.3–15.4)
WBC: 9.8 10*3/uL (ref 3.4–10.8)

## 2014-10-29 LAB — CMP14+EGFR
ALBUMIN: 3.9 g/dL (ref 3.5–4.8)
ALT: 20 IU/L (ref 0–32)
AST: 27 IU/L (ref 0–40)
Albumin/Globulin Ratio: 1.4 (ref 1.1–2.5)
Alkaline Phosphatase: 58 IU/L (ref 39–117)
BUN / CREAT RATIO: 21 (ref 11–26)
BUN: 25 mg/dL (ref 8–27)
Bilirubin Total: 0.2 mg/dL (ref 0.0–1.2)
CO2: 36 mmol/L — ABNORMAL HIGH (ref 18–29)
Calcium: 9.7 mg/dL (ref 8.7–10.3)
Chloride: 87 mmol/L — ABNORMAL LOW (ref 97–108)
Creatinine, Ser: 1.17 mg/dL — ABNORMAL HIGH (ref 0.57–1.00)
GFR calc Af Amer: 55 mL/min/{1.73_m2} — ABNORMAL LOW (ref >59)
GFR calc non Af Amer: 47 mL/min/{1.73_m2} — ABNORMAL LOW (ref >59)
GLOBULIN, TOTAL: 2.7 g/dL (ref 1.5–4.5)
Glucose: 110 mg/dL — ABNORMAL HIGH (ref 65–99)
Potassium: 4.4 mmol/L (ref 3.5–5.2)
SODIUM: 139 mmol/L (ref 134–144)
Total Protein: 6.6 g/dL (ref 6.0–8.5)

## 2014-11-01 ENCOUNTER — Other Ambulatory Visit: Payer: Self-pay

## 2014-11-01 ENCOUNTER — Telehealth: Payer: Self-pay | Admitting: Nurse Practitioner

## 2014-11-01 LAB — QUANTIFERON IN TUBE
QFT TB AG MINUS NIL VALUE: 0.18 [IU]/mL
QUANTIFERON MITOGEN VALUE: >10 IU/mL
QUANTIFERON TB AG VALUE: 0.22 [IU]/mL
QUANTIFERON TB GOLD: NEGATIVE
Quantiferon Nil Value: 0.04 IU/mL

## 2014-11-01 LAB — QUANTIFERON TB GOLD ASSAY (BLOOD)

## 2014-11-01 MED ORDER — METOPROLOL TARTRATE 50 MG PO TABS: 50.0000 mg | ORAL_TABLET | Freq: Two times a day (BID) | ORAL | Status: DC

## 2014-11-01 MED ORDER — ALPRAZOLAM 0.5 MG PO TABS: 0.5000 mg | ORAL_TABLET | Freq: Two times a day (BID) | ORAL | Status: DC

## 2014-11-01 MED ORDER — FUROSEMIDE 20 MG PO TABS: 20.0000 mg | ORAL_TABLET | Freq: Every day | ORAL | Status: DC

## 2014-11-01 MED ORDER — OXYCODONE-ACETAMINOPHEN 10-325 MG PO TABS
1.0000 | ORAL_TABLET | Freq: Three times a day (TID) | ORAL | Status: DC | PRN
Start: 2014-11-01 — End: 2014-11-28

## 2014-11-01 NOTE — Telephone Encounter (Signed)
Last seen 05/25/14 MMM  If approved print for mail order

## 2014-11-10 ENCOUNTER — Encounter: Payer: Self-pay | Admitting: Vascular Surgery

## 2014-11-11 ENCOUNTER — Ambulatory Visit (HOSPITAL_COMMUNITY)
Admission: RE | Admit: 2014-11-11 | Discharge: 2014-11-11 | Disposition: A | Discharge status: Home / Self Care | Payer: Medicare Other | Source: Ambulatory Visit | Attending: Vascular Surgery | Admitting: Vascular Surgery

## 2014-11-11 ENCOUNTER — Encounter: Payer: Self-pay | Admitting: Vascular Surgery

## 2014-11-11 ENCOUNTER — Ambulatory Visit (INDEPENDENT_AMBULATORY_CARE_PROVIDER_SITE_OTHER): Payer: Medicare Other | Admitting: Vascular Surgery

## 2014-11-11 VITALS — BP 118/70 | HR 79 | Ht 59.0 in | Wt 102.0 lb

## 2014-11-11 DIAGNOSIS — I70219 Atherosclerosis of native arteries of extremities with intermittent claudication, unspecified extremity: Secondary | ICD-10-CM

## 2014-11-11 DIAGNOSIS — I739 Peripheral vascular disease, unspecified: Secondary | ICD-10-CM | POA: Insufficient documentation

## 2014-11-11 NOTE — Addendum Note (Signed)
Addended by: Dorthula Rue L on: 11/11/2014 05:04 PM   Modules accepted: Orders

## 2014-11-11 NOTE — Progress Notes (Signed)
Established Intermittent Claudication  History of Present Illness  [name] is a 69 y.o. (17-Dec-1945) female who presents with chief complaint: bruising in L foot.  This patient noted some easy bruising of L lateral shin with scratching.  She also notes some numbness and swelling in her feet.  The patient's symptoms have not progressed.  She continued to have limited activity due to being oxygen dependent due to COPD.  She does not have rest pain.  She does have some intermittent claudication.  The patient's treatment regimen currently included: maximal medical management.  She not able to tolerate the walking plan  The patient's PMH, PSH, and SH, and FamHx are unchanged from 06/09/14.  Current Outpatient Prescriptions  Medication Sig Dispense Refill  . adalimumab (HUMIRA PEN) 40 MG/0.8ML injection Inject 40 mg into the skin every 14 (fourteen) days.     Marland Kitchen albuterol (PROVENTIL) (2.5 MG/3ML) 0.083% nebulizer solution USE ONE VIAL IN NEBULIZER EVERY 6 HOURS AS NEEDED FOR WHEEZING AND FOR SHORTNESS OF BREATH 1080 mL 2  . ALPRAZolam (XANAX) 0.5 MG tablet Take 1 tablet (0.5 mg total) by mouth 2 (two) times daily. 180 tablet 0  . amitriptyline (ELAVIL) 50 MG tablet Take 1 tablet by mouth at  bedtime 90 tablet 0  . aspirin 325 MG tablet Take 325 mg by mouth daily.      Marland Kitchen azithromycin (ZITHROMAX Z-PAK) 250 MG tablet As directed 1 each 0  . buPROPion (WELLBUTRIN XL) 150 MG 24 hr tablet TAKE ONE TABLET BY MOUTH ONCE DAILY 90 tablet 0  . cetirizine (ALL DAY ALLERGY) 10 MG tablet Take 10 mg by mouth daily.    . cholecalciferol (VITAMIN D) 1000 UNITS tablet Take 1,000 Units by mouth daily.    Marland Kitchen EQL CALCIUM CITRATE/VITAMIN D PO Take by mouth. Vitamin D 500IU with Calcium Citrate 630mg     . Fluticasone Furoate-Vilanterol (BREO ELLIPTA) 100-25 MCG/INH AEPB Inhale into the lungs.    . furosemide (LASIX) 20 MG tablet Take 1 tablet (20 mg total) by mouth daily. 90 tablet 0  . guaifenesin (MUCUS  RELIEF) 400 MG TABS tablet Take 400 mg by mouth daily as needed (for ocngestion).    . metoprolol (LOPRESSOR) 50 MG tablet Take 1 tablet (50 mg total) by mouth 2 (two) times daily. 180 tablet 0  . oxyCODONE-acetaminophen (PERCOCET) 10-325 MG per tablet Take 1 tablet by mouth every 8 (eight) hours as needed for pain. 60 tablet 0  . PROAIR HFA 108 (90 BASE) MCG/ACT inhaler INHALE 2 PUFFS INTO THE LUNGS EVERY 6 HOURS AS NEEDED FOR WHEEZING OR SHORTNESS OF BREATH 3 Inhaler 11  . tiotropium (SPIRIVA) 18 MCG inhalation capsule Place 1 capsule (18 mcg total) into inhaler and inhale every morning. 90 capsule 1  . potassium chloride SA (K-DUR,KLOR-CON) 20 MEQ tablet Take 2 tablets (40 mEq total) by mouth 2 (two) times daily. 4 tablet 0   No current facility-administered medications for this visit.    Allergies  Allergen Reactions  . Celexa [Citalopram Hydrobromide]     Jerky movements  . Nickel   . Tramadol Hcl     REACTION: nausea and vomiting    On ROS today: no rest pain, ankle and foot swelling.   Physical Examination  Filed Vitals:   11/11/14 1543  BP: 118/70  Pulse: 79  Height: 4\' 11"  (1.499 m)  Weight: 102 lb (46.267 kg)  SpO2: 88%   Body mass index is 20.59 kg/(m^2).  General: A&O x 3,  WD, elderly, thin  Pulmonary: Sym exp, good air movt, CTAB, no rales, rhonchi, & wheezing  Cardiac: RRR, Nl S1, S2, no Murmurs, rubs or gallops  Vascular: Vessel Right Left  Radial Palpable Palpable  Brachial Palpable Palpable  Carotid Palpable, without bruit Palpable, without bruit  Aorta Not palpable N/A  Femoral Palpable Palpable  Popliteal Not palpable Not palpable  PT Palpable Not Palpable  DP Not Palpable Palpable   Gastrointestinal: soft, NTND, no G/R, no HSM, no masses, no CVAT B  Musculoskeletal: M/S 5/5 throughout , Extremities without ischemic changes , B cyanotic feet, no edema today, echymosis on L lateral calf  Neurologic: Pain and light touch intact in extremities ,  Motor exam as listed above   Non-Invasive Vascular Imaging ABI (Date: 11/11/2014)  R: 0.79 (0.89), DP: mono, PT: mono, TBI: 0.57  L: 0.77 (0.74), DP: tri, PT: mono, TBI: 0.57    Medical Decision Making  [name] is a 69 y.o. female who presents with:  bilateral leg intermittent claudication without evidence of critical limb ischemia, severe COPD   Again, her COPD limits her ability to ambulate, likely limiting her sx.  If her COPD were to improve, I would proceed with the walking plan as discussed with her.  Based on the patient's vascular studies and examination, I have offered the patient: q6 mon ABI.  I discussed in depth with the patient the nature of atherosclerosis, and emphasized the importance of maximal medical management including strict control of blood pressure, blood glucose, and lipid levels, antiplatelet agents, obtaining regular exercise, and cessation of smoking.    The patient is aware that without maximal medical management the underlying atherosclerotic disease process will progress, limiting the benefit of any interventions. The patient is currently not on a statin: as not medical indicated. The patient is currently on an anti-platelet: ASA.  Thank you for allowing [place] to participate in this patient's care.   Adele Barthel, MD [facility] Office: [phone] Pager: [phone]  11/11/2014, 4:06 PM

## 2014-11-22 ENCOUNTER — Other Ambulatory Visit: Payer: Self-pay | Admitting: Family Medicine

## 2014-11-25 ENCOUNTER — Other Ambulatory Visit: Payer: Self-pay

## 2014-11-25 ENCOUNTER — Other Ambulatory Visit: Payer: Self-pay | Admitting: *Deleted

## 2014-11-25 MED ORDER — ALPRAZOLAM 0.5 MG PO TABS: 0.5000 mg | ORAL_TABLET | Freq: Two times a day (BID) | ORAL | Status: DC

## 2014-11-25 NOTE — Telephone Encounter (Signed)
Patient aware script can be picked up at front desk with photo ID 

## 2014-11-25 NOTE — Telephone Encounter (Signed)
Last seen 05/25/14  MMM  This is for mail order

## 2014-11-25 NOTE — Telephone Encounter (Signed)
no more refills without being seen  

## 2014-11-25 NOTE — Telephone Encounter (Signed)
Requesting refill on pain medication. She was getting hydrocodone and it was last filled in 07/2014. She stretches it out. The EMR states that she was given a script for oxycodone in 10/2014 but she states that she doesn't taken oxycodone and that she doesn't have that prescription. [facility] desk staff checked and patient did not pickup a prescription for oxycodone last month.   Can you refill her hydrocodone?

## 2014-11-28 MED ORDER — HYDROCODONE-ACETAMINOPHEN 5-325 MG PO TABS: 1.0000 | ORAL_TABLET | Freq: Four times a day (QID) | ORAL | Status: DC | PRN

## 2014-11-28 NOTE — Telephone Encounter (Signed)
Pain rx ready for pick up no more refills without being seen  

## 2014-11-28 NOTE — Telephone Encounter (Signed)
Patient aware and she will schedule a appointment.

## 2014-11-29 ENCOUNTER — Telehealth: Payer: Self-pay | Admitting: Nurse Practitioner

## 2014-11-30 ENCOUNTER — Other Ambulatory Visit: Payer: Self-pay | Admitting: Nurse Practitioner

## 2014-11-30 MED ORDER — HYDROCODONE-ACETAMINOPHEN 10-325 MG PO TABS: 1.0000 | ORAL_TABLET | Freq: Three times a day (TID) | ORAL | Status: DC | PRN

## 2014-11-30 NOTE — Telephone Encounter (Signed)
Pt aware to bring old Rx back per Ronnald Collum, FNP's request.

## 2014-11-30 NOTE — Telephone Encounter (Signed)
Must bring old rx back- hydrocodone was no longer on her med list that is why she got that dose,

## 2014-12-01 ENCOUNTER — Other Ambulatory Visit: Payer: Self-pay | Admitting: *Deleted

## 2014-12-01 MED ORDER — ALPRAZOLAM 0.5 MG PO TABS: 0.5000 mg | ORAL_TABLET | Freq: Two times a day (BID) | ORAL | Status: DC

## 2014-12-01 MED ORDER — FUROSEMIDE 20 MG PO TABS: 20.0000 mg | ORAL_TABLET | Freq: Every day | ORAL | Status: DC

## 2014-12-01 MED ORDER — ALBUTEROL SULFATE (2.5 MG/3ML) 0.083% IN NEBU: INHALATION_SOLUTION | RESPIRATORY_TRACT | Status: DC

## 2014-12-01 MED ORDER — BUPROPION HCL ER (XL) 150 MG PO TB24: 150.0000 mg | ORAL_TABLET | Freq: Every day | ORAL | Status: DC

## 2014-12-01 MED ORDER — METOPROLOL TARTRATE 50 MG PO TABS: 50.0000 mg | ORAL_TABLET | Freq: Two times a day (BID) | ORAL | Status: DC

## 2014-12-01 NOTE — Telephone Encounter (Signed)
Refill called to [facility] & appt made for 10/7 w/ MMM

## 2014-12-01 NOTE — Telephone Encounter (Signed)
no more refills without being seen  

## 2014-12-23 ENCOUNTER — Encounter: Payer: Self-pay | Admitting: Nurse Practitioner

## 2014-12-23 ENCOUNTER — Ambulatory Visit (INDEPENDENT_AMBULATORY_CARE_PROVIDER_SITE_OTHER): Payer: Medicare Other | Admitting: Nurse Practitioner

## 2014-12-23 VITALS — BP 109/64 | HR 93 | Temp 97.2°F | Ht 59.0 in | Wt 101.0 lb

## 2014-12-23 DIAGNOSIS — IMO0001 Reserved for inherently not codable concepts without codable children: Secondary | ICD-10-CM

## 2014-12-23 DIAGNOSIS — F32A Depression, unspecified: Secondary | ICD-10-CM

## 2014-12-23 DIAGNOSIS — M8580 Other specified disorders of bone density and structure, unspecified site: Secondary | ICD-10-CM

## 2014-12-23 DIAGNOSIS — F411 Generalized anxiety disorder: Secondary | ICD-10-CM | POA: Diagnosis not present

## 2014-12-23 DIAGNOSIS — J449 Chronic obstructive pulmonary disease, unspecified: Secondary | ICD-10-CM

## 2014-12-23 DIAGNOSIS — F329 Major depressive disorder, single episode, unspecified: Secondary | ICD-10-CM | POA: Diagnosis not present

## 2014-12-23 DIAGNOSIS — I70219 Atherosclerosis of native arteries of extremities with intermittent claudication, unspecified extremity: Secondary | ICD-10-CM

## 2014-12-23 DIAGNOSIS — E785 Hyperlipidemia, unspecified: Secondary | ICD-10-CM

## 2014-12-23 DIAGNOSIS — I10 Essential (primary) hypertension: Secondary | ICD-10-CM | POA: Diagnosis not present

## 2014-12-23 DIAGNOSIS — Z23 Encounter for immunization: Secondary | ICD-10-CM

## 2014-12-23 DIAGNOSIS — G4700 Insomnia, unspecified: Secondary | ICD-10-CM

## 2014-12-23 DIAGNOSIS — I509 Heart failure, unspecified: Secondary | ICD-10-CM | POA: Diagnosis not present

## 2014-12-23 DIAGNOSIS — I739 Peripheral vascular disease, unspecified: Secondary | ICD-10-CM | POA: Diagnosis not present

## 2014-12-23 MED ORDER — ALPRAZOLAM 0.5 MG PO TABS: 0.5000 mg | ORAL_TABLET | Freq: Two times a day (BID) | ORAL | Status: DC

## 2014-12-23 MED ORDER — BUPROPION HCL ER (XL) 150 MG PO TB24: 150.0000 mg | ORAL_TABLET | Freq: Every day | ORAL | Status: DC

## 2014-12-23 MED ORDER — AMITRIPTYLINE HCL 50 MG PO TABS: ORAL_TABLET | ORAL | Status: AC

## 2014-12-23 MED ORDER — TIOTROPIUM BROMIDE MONOHYDRATE 18 MCG IN CAPS: 18.0000 ug | ORAL_CAPSULE | Freq: Every morning | RESPIRATORY_TRACT | Status: DC

## 2014-12-23 MED ORDER — METOPROLOL TARTRATE 50 MG PO TABS: 50.0000 mg | ORAL_TABLET | Freq: Two times a day (BID) | ORAL | Status: DC

## 2014-12-23 MED ORDER — FUROSEMIDE 20 MG PO TABS: 20.0000 mg | ORAL_TABLET | Freq: Every day | ORAL | Status: DC

## 2014-12-23 NOTE — Progress Notes (Signed)
Subjective:    Patient ID: [name], female    DOB: 12-11-1945, 69 y.o.   MRN: [id number]  HPI  Patient in today for follow up of chronic medical problems. Patient is doing well- she is on constant o2 via nasal cannulla. SHe has no real complaints today.  Patient Active Problem List   Diagnosis Date Noted  . Atherosclerotic peripheral vascular disease with intermittent claudication ([place]) 06/09/2014  . Acute respiratory failure with hypercapnia ([place]) 07/31/2013  . FTT (failure to thrive) in adult 07/31/2013  . CHF (congestive heart failure) ([place]) 06/10/2013  . 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 Osteopenia 06/16/2010  . Hyperlipidemia 06/16/2010  . HTN (hypertension) 06/16/2010  . Insomnia 06/16/2010  . RA (rheumatoid arthritis) ([place]) 06/16/2010  . RHEUMATIC FEVER, HX OF 04/20/2009  . COMPUTERIZED TOMOGRAPHY, CHEST, ABNORMAL 04/20/2009  . COPD bronchitis 04/18/2009   Outpatient Encounter Prescriptions as of 12/23/2014  Medication Sig  . adalimumab (HUMIRA PEN) 40 MG/0.8ML injection Inject 40 mg into the skin every 14 (fourteen) days.   Marland Kitchen albuterol (PROVENTIL) (2.5 MG/3ML) 0.083% nebulizer solution USE ONE VIAL IN NEBULIZER EVERY 6 HOURS AS NEEDED FOR WHEEZING AND FOR SHORTNESS OF BREATH  . ALPRAZolam (XANAX) 0.5 MG tablet Take 1 tablet (0.5 mg total) by mouth 2 (two) times daily.  Marland Kitchen amitriptyline (ELAVIL) 50 MG tablet Take 1 tablet by mouth at  bedtime  . aspirin 325 MG tablet Take 325 mg by mouth daily.    Marland Kitchen buPROPion (WELLBUTRIN XL) 150 MG 24 hr tablet Take 1 tablet (150 mg total) by mouth daily.  . cetirizine (ALL DAY ALLERGY) 10 MG tablet Take 10 mg by mouth daily.  . cholecalciferol (VITAMIN D) 1000 UNITS tablet Take 1,000 Units by mouth daily.  Marland Kitchen EQL CALCIUM CITRATE/VITAMIN D PO Take by mouth. Vitamin D 500IU with Calcium Citrate 630mg   . Fluticasone Furoate-Vilanterol (BREO ELLIPTA) 100-25 MCG/INH AEPB Inhale into the lungs.  . furosemide (LASIX) 20 MG tablet Take 1 tablet  (20 mg total) by mouth daily.  Marland Kitchen guaifenesin (MUCUS RELIEF) 400 MG TABS tablet Take 400 mg by mouth daily as needed (for ocngestion).  Marland Kitchen HYDROcodone-acetaminophen (NORCO) 10-325 MG per tablet Take 1 tablet by mouth every 8 (eight) hours as needed.  . metoprolol (LOPRESSOR) 50 MG tablet Take 1 tablet (50 mg total) by mouth 2 (two) times daily.  Marland Kitchen PROAIR HFA 108 (90 BASE) MCG/ACT inhaler INHALE 2 PUFFS INTO THE LUNGS EVERY 6 HOURS AS NEEDED FOR WHEEZING OR SHORTNESS OF BREATH  . tiotropium (SPIRIVA) 18 MCG inhalation capsule Place 1 capsule (18 mcg total) into inhaler and inhale every morning.  . [DISCONTINUED] azithromycin (ZITHROMAX Z-PAK) 250 MG tablet As directed  . potassium chloride SA (K-DUR,KLOR-CON) 20 MEQ tablet Take 2 tablets (40 mEq total) by mouth 2 (two) times daily.   No facility-administered encounter medications on file as of 12/23/2014.      Review of Systems  Constitutional: Positive for unexpected weight change. Negative for appetite change.  HENT: Positive for congestion. Negative for ear discharge, rhinorrhea and sinus pressure.   Respiratory: Positive for shortness of breath and wheezing.   Cardiovascular: Negative for chest pain, palpitations and leg swelling.  Gastrointestinal: Negative.   Genitourinary: Negative.   Neurological: Negative.   Psychiatric/Behavioral: Negative.   All other systems reviewed and are negative.      Objective:   Physical Exam  Constitutional: She is oriented to person, place, and time. She appears well-developed and well-nourished.  HENT:  Nose: Nose  normal.  Mouth/Throat: Oropharynx is clear and moist.  Eyes: EOM are normal.  Neck: Trachea normal, normal range of motion and full passive range of motion without pain. Neck supple. No JVD present. Carotid bruit is not present. No thyromegaly present.  Cardiovascular: Normal rate, regular rhythm, normal heart sounds and intact distal pulses.  Exam reveals no gallop and no friction rub.    No murmur heard. Pulmonary/Chest: Effort normal and breath sounds normal.  o2 via nasal cannulla  Abdominal: Soft. Bowel sounds are normal. She exhibits no distension and no mass. There is no tenderness.  Musculoskeletal: Normal range of motion.  Lymphadenopathy:    She has no cervical adenopathy.  Neurological: She is alert and oriented to person, place, and time. She has normal reflexes.  Skin: Skin is warm and dry.  Psychiatric: She has a normal mood and affect. Her behavior is normal. Judgment and thought content normal.    BP 109/64 mmHg  Pulse 93  Temp(Src) 97.2 F (36.2 C) (Oral)  Ht 4\' 11"  (1.499 m)  Wt 101 lb (45.813 kg)  BMI 20.39 kg/m2       Assessment & Plan:  1. Essential hypertension Do not add salt to diet  2. Chronic congestive heart failure, unspecified congestive heart failure type (HCC) - metoprolol (LOPRESSOR) 50 MG tablet; Take 1 tablet (50 mg total) by mouth 2 (two) times daily.  Dispense: 180 tablet; Refill: 1 - furosemide (LASIX) 20 MG tablet; Take 1 tablet (20 mg total) by mouth daily.  Dispense: 90 tablet; Refill: 1  3. Atherosclerotic peripheral vascular disease with intermittent claudication ([place])  4. Osteopenia Weight bearing exercises  5. Hyperlipidemia Low fat diet  6. Insomnia Bedtime ritual - amitriptyline (ELAVIL) 50 MG tablet; Take 1 tablet by mouth at  bedtime  Dispense: 90 tablet; Refill: 1  7. COPD bronchitis Avoid cigarette smoke - tiotropium (SPIRIVA) 18 MCG inhalation capsule; Place 1 capsule (18 mcg total) into inhaler and inhale every morning.  Dispense: 90 capsule; Refill: 1  8. GAD (generalized anxiety disorder) Stress management - ALPRAZolam (XANAX) 0.5 MG tablet; Take 1 tablet (0.5 mg total) by mouth 2 (two) times daily.  Dispense: 180 tablet; Refill: 0  9. Depression - buPROPion (WELLBUTRIN XL) 150 MG 24 hr tablet; Take 1 tablet (150 mg total) by mouth daily.  Dispense: 90 tablet; Refill: 1   Encouraged patient  to drink ensure daily due to weight loss and poor appetita Labs pending Health maintenance reviewed Diet and exercise encouraged Continue all meds Follow up  In 3 months   [place], FNP

## 2014-12-23 NOTE — Addendum Note (Signed)
Addended by: Rolena Infante on: 12/23/2014 05:12 PM   Modules accepted: Orders

## 2014-12-23 NOTE — Patient Instructions (Signed)
Bone Health Bones protect organs, store calcium, and anchor muscles. Good health habits, such as eating nutritious foods and exercising regularly, are important for maintaining healthy bones. They can also help to prevent a condition that causes bones to lose density and become weak and brittle (osteoporosis). WHY IS BONE MASS IMPORTANT? Bone mass refers to the amount of bone tissue that you have. The higher your bone mass, the stronger your bones. An important step toward having healthy bones throughout life is to have strong and dense bones during childhood. A young adult who has a high bone mass is more likely to have a high bone mass later in life. Bone mass at its greatest it is called peak bone mass. A large decline in bone mass occurs in older adults. In women, it occurs about the time of menopause. During this time, it is important to practice good health habits, because if more bone is lost than what is replaced, the bones will become less healthy and more likely to break (fracture). If you find that you have a low bone mass, you may be able to prevent osteoporosis or further bone loss by changing your diet and lifestyle. HOW CAN I FIND OUT IF MY BONE MASS IS LOW? Bone mass can be measured with an X-ray test that is called a bone mineral density (BMD) test. This test is recommended for all women who are age 65 or older. It may also be recommended for men who are age 70 or older, or for people who are more likely to develop osteoporosis due to:  Having bones that break easily.  Having a long-term disease that weakens bones, such as kidney disease or rheumatoid arthritis.  Having menopause earlier than normal.  Taking medicine that weakens bones, such as steroids, thyroid hormones, or hormone treatment for breast cancer or prostate cancer.  Smoking.  Drinking three or more alcoholic drinks each day. WHAT ARE THE NUTRITIONAL RECOMMENDATIONS FOR HEALTHY BONES? To have healthy bones, you need  to get enough of the right minerals and vitamins. Most nutrition experts recommend getting these nutrients from the foods that you eat. Nutritional recommendations vary from person to person. Ask your health care provider what is healthy for you. Here are some general guidelines. Calcium Recommendations Calcium is the most important (essential) mineral for bone health. Most people can get enough calcium from their diet, but supplements may be recommended for people who are at risk for osteoporosis. Good sources of calcium include:  Dairy products, such as low-fat or nonfat milk, cheese, and yogurt.  Dark green leafy vegetables, such as bok choy and broccoli.  Calcium-fortified foods, such as orange juice, cereal, bread, soy beverages, and tofu products.  Nuts, such as almonds. Follow these recommended amounts for daily calcium intake:  Children, age 1-3: 700 mg.  Children, age 4-8: 1,000 mg.  Children, age 9-13: 1,300 mg.  Teens, age 14-18: 1,300 mg.  Adults, age 19-50: 1,000 mg.  Adults, age 51-70:  Men: 1,000 mg.  Women: 1,200 mg.  Adults, age 71 or older: 1,200 mg.  Pregnant and breastfeeding females:  Teens: 1,300 mg.  Adults: 1,000 mg. Vitamin D Recommendations Vitamin D is the most essential vitamin for bone health. It helps the body to absorb calcium. Sunlight stimulates the skin to make vitamin D, so be sure to get enough sunlight. If you live in a cold climate or you do not get outside often, your health care provider may recommend that you take vitamin D supplements. Good   sources of vitamin D in your diet include:  Egg yolks.  Saltwater fish.  Milk and cereal fortified with vitamin D. Follow these recommended amounts for daily vitamin D intake:  Children and teens, age 1-18: 600 international units.  Adults, age 50 or younger: 400-800 international units.  Adults, age 51 or older: 800-1,000 international units. Other Nutrients Other nutrients for bone  health include:  Phosphorus. This mineral is found in meat, poultry, dairy foods, nuts, and legumes. The recommended daily intake for adult men and adult women is 700 mg.  Magnesium. This mineral is found in seeds, nuts, dark green vegetables, and legumes. The recommended daily intake for adult men is 400-420 mg. For adult women, it is 310-320 mg.  Vitamin K. This vitamin is found in green leafy vegetables. The recommended daily intake is 120 mg for adult men and 90 mg for adult women. WHAT TYPE OF PHYSICAL ACTIVITY IS BEST FOR BUILDING AND MAINTAINING HEALTHY BONES? Weight-bearing and strength-building activities are important for building and maintaining peak bone mass. Weight-bearing activities cause muscles and bones to work against gravity. Strength-building activities increases muscle strength that supports bones. Weight-bearing and muscle-building activities include:  Walking and hiking.  Jogging and running.  Dancing.  Gym exercises.  Lifting weights.  Tennis and racquetball.  Climbing stairs.  Aerobics. Adults should get at least 30 minutes of moderate physical activity on most days. Children should get at least 60 minutes of moderate physical activity on most days. Ask your health care provide what type of exercise is best for you. WHERE CAN I FIND MORE INFORMATION? For more information, check out the following websites:  National Osteoporosis Foundation: http://nof.org/learn/basics  National Institutes of Health: http://www.niams.nih.gov/Health_Info/Bone/Bone_Health/bone_health_for_life.asp   This information is not intended to replace advice given to you by your health care provider. Make sure you discuss any questions you have with your health care provider.   Document Released: 05/25/2003 Document Revised: 07/19/2014 Document Reviewed: 03/09/2014 Elsevier Interactive Patient Education 2016 [employer]  

## 2015-03-01 ENCOUNTER — Other Ambulatory Visit: Payer: Self-pay | Admitting: Nurse Practitioner

## 2015-03-01 NOTE — Telephone Encounter (Signed)
Last office visit was 12-23-14. Please advise on medications for sickness.

## 2015-03-02 NOTE — Telephone Encounter (Signed)
Pt is aware ntbs.

## 2015-03-02 NOTE — Telephone Encounter (Signed)
Will NTbS to get antibiotic

## 2015-03-06 ENCOUNTER — Other Ambulatory Visit: Payer: Self-pay | Admitting: Family Medicine

## 2015-03-07 ENCOUNTER — Other Ambulatory Visit: Payer: Self-pay | Admitting: Nurse Practitioner

## 2015-03-07 MED ORDER — ALBUTEROL SULFATE HFA 108 (90 BASE) MCG/ACT IN AERS: INHALATION_SPRAY | RESPIRATORY_TRACT | Status: AC

## 2015-03-07 NOTE — Telephone Encounter (Signed)
Refill denied, not out patient

## 2015-03-07 NOTE — Telephone Encounter (Signed)
done

## 2015-04-28 ENCOUNTER — Telehealth: Payer: Self-pay | Admitting: Nurse Practitioner

## 2015-05-15 DIAGNOSIS — Z79899 Other long term (current) drug therapy: Secondary | ICD-10-CM | POA: Diagnosis not present

## 2015-05-15 DIAGNOSIS — L409 Psoriasis, unspecified: Secondary | ICD-10-CM | POA: Diagnosis not present

## 2015-05-15 DIAGNOSIS — D692 Other nonthrombocytopenic purpura: Secondary | ICD-10-CM | POA: Diagnosis not present

## 2015-05-22 ENCOUNTER — Encounter: Payer: Self-pay | Admitting: Family

## 2015-05-24 ENCOUNTER — Other Ambulatory Visit: Payer: Self-pay | Admitting: Nurse Practitioner

## 2015-05-25 MED ORDER — HYDROCODONE-ACETAMINOPHEN 10-325 MG PO TABS: 1.0000 | ORAL_TABLET | Freq: Three times a day (TID) | ORAL | Status: DC | PRN

## 2015-05-25 NOTE — Telephone Encounter (Signed)
rx ready for pickup 

## 2015-05-25 NOTE — Telephone Encounter (Signed)
Pt aware written Rx is at front desk ready for pickup  

## 2015-05-25 NOTE — Telephone Encounter (Signed)
Last filled 11/30/14, last seen 12/23/14

## 2015-05-26 ENCOUNTER — Ambulatory Visit: Payer: Medicare Other | Admitting: Family

## 2015-05-26 ENCOUNTER — Encounter (HOSPITAL_COMMUNITY): Payer: Medicare Other

## 2015-06-02 ENCOUNTER — Ambulatory Visit: Payer: Medicare Other | Admitting: Nurse Practitioner

## 2015-06-20 ENCOUNTER — Other Ambulatory Visit: Payer: Self-pay | Admitting: Nurse Practitioner

## 2015-06-22 ENCOUNTER — Telehealth: Payer: Self-pay | Admitting: Nurse Practitioner

## 2015-06-22 DIAGNOSIS — IMO0001 Reserved for inherently not codable concepts without codable children: Secondary | ICD-10-CM

## 2015-06-23 MED ORDER — TIOTROPIUM BROMIDE MONOHYDRATE 18 MCG IN CAPS: 18.0000 ug | ORAL_CAPSULE | Freq: Every morning | RESPIRATORY_TRACT | Status: DC

## 2015-06-23 NOTE — Telephone Encounter (Signed)
Called pt, she said [facility] [place] a refill request. i went ahead and filled.

## 2015-06-26 ENCOUNTER — Other Ambulatory Visit: Payer: Self-pay | Admitting: *Deleted

## 2015-06-26 DIAGNOSIS — IMO0001 Reserved for inherently not codable concepts without codable children: Secondary | ICD-10-CM

## 2015-06-26 MED ORDER — TIOTROPIUM BROMIDE MONOHYDRATE 18 MCG IN CAPS: 18.0000 ug | ORAL_CAPSULE | Freq: Every morning | RESPIRATORY_TRACT | Status: AC

## 2015-07-14 ENCOUNTER — Other Ambulatory Visit: Payer: Self-pay | Admitting: Nurse Practitioner

## 2015-07-14 DIAGNOSIS — F411 Generalized anxiety disorder: Secondary | ICD-10-CM

## 2015-07-14 NOTE — Telephone Encounter (Signed)
Patient last seen in office on 12-23-14. Rx last filled on 12-23-14 for #[id number]. Please advise. If approved please route to Pool B

## 2015-07-14 NOTE — Telephone Encounter (Signed)
Request sent to provider. Awaiting response. 

## 2015-07-17 ENCOUNTER — Telehealth: Payer: Self-pay | Admitting: Nurse Practitioner

## 2015-07-17 MED ORDER — ALPRAZOLAM 0.5 MG PO TABS: 0.5000 mg | ORAL_TABLET | Freq: Two times a day (BID) | ORAL | Status: DC

## 2015-07-17 NOTE — Telephone Encounter (Signed)
Please call in xanax with 0 refills 

## 2015-08-16 ENCOUNTER — Telehealth: Payer: Self-pay | Admitting: Nurse Practitioner

## 2015-08-16 NOTE — Telephone Encounter (Signed)
Patient takes blood thinner and aspirin.  Is it ok to take over the counter  diactive which contains calcium,vitamin D and vitamin k?

## 2015-08-17 NOTE — Telephone Encounter (Signed)
Yes that is fine

## 2015-08-17 NOTE — Telephone Encounter (Signed)
Pt notified of MMM recommendation Verbalizes understanding  

## 2015-09-29 ENCOUNTER — Other Ambulatory Visit: Payer: Self-pay | Admitting: Nurse Practitioner

## 2015-09-29 NOTE — Telephone Encounter (Signed)
Spoke with pt and offered an appt but they declined and will call back next week if still need it.

## 2015-09-29 NOTE — Telephone Encounter (Signed)
NTBS.

## 2015-10-03 ENCOUNTER — Telehealth: Payer: Self-pay | Admitting: Nurse Practitioner

## 2015-10-03 NOTE — Telephone Encounter (Signed)
Denied.

## 2015-10-04 ENCOUNTER — Other Ambulatory Visit: Payer: Self-pay | Admitting: Nurse Practitioner

## 2015-10-04 NOTE — Telephone Encounter (Signed)
Last refill without being seen 

## 2015-10-04 NOTE — Telephone Encounter (Signed)
Pt last seen by [facility] 12/2014 Will route to mail order if approved

## 2015-10-15 ENCOUNTER — Inpatient Hospital Stay (HOSPITAL_COMMUNITY)
Admission: EM | Admit: 2015-10-15 | Discharge: 2015-10-25 | DRG: 208 | Disposition: A | Discharge status: Skilled Nursing Facility | Payer: Medicare Other | Attending: Internal Medicine | Admitting: Internal Medicine

## 2015-10-15 ENCOUNTER — Emergency Department (HOSPITAL_COMMUNITY): Payer: Medicare Other

## 2015-10-15 ENCOUNTER — Encounter (HOSPITAL_COMMUNITY): Payer: Self-pay | Admitting: Emergency Medicine

## 2015-10-15 DIAGNOSIS — J9622 Acute and chronic respiratory failure with hypercapnia: Secondary | ICD-10-CM | POA: Diagnosis not present

## 2015-10-15 DIAGNOSIS — J9690 Respiratory failure, unspecified, unspecified whether with hypoxia or hypercapnia: Secondary | ICD-10-CM

## 2015-10-15 DIAGNOSIS — J9620 Acute and chronic respiratory failure, unspecified whether with hypoxia or hypercapnia: Secondary | ICD-10-CM

## 2015-10-15 DIAGNOSIS — I4891 Unspecified atrial fibrillation: Secondary | ICD-10-CM | POA: Diagnosis not present

## 2015-10-15 DIAGNOSIS — Z87891 Personal history of nicotine dependence: Secondary | ICD-10-CM

## 2015-10-15 DIAGNOSIS — F411 Generalized anxiety disorder: Secondary | ICD-10-CM

## 2015-10-15 DIAGNOSIS — F419 Anxiety disorder, unspecified: Secondary | ICD-10-CM | POA: Diagnosis present

## 2015-10-15 DIAGNOSIS — J9621 Acute and chronic respiratory failure with hypoxia: Secondary | ICD-10-CM | POA: Diagnosis not present

## 2015-10-15 DIAGNOSIS — E785 Hyperlipidemia, unspecified: Secondary | ICD-10-CM | POA: Diagnosis not present

## 2015-10-15 DIAGNOSIS — J9601 Acute respiratory failure with hypoxia: Secondary | ICD-10-CM | POA: Diagnosis not present

## 2015-10-15 DIAGNOSIS — I5032 Chronic diastolic (congestive) heart failure: Secondary | ICD-10-CM | POA: Diagnosis not present

## 2015-10-15 DIAGNOSIS — R05 Cough: Secondary | ICD-10-CM | POA: Diagnosis not present

## 2015-10-15 DIAGNOSIS — Z820 Family history of epilepsy and other diseases of the nervous system: Secondary | ICD-10-CM | POA: Diagnosis not present

## 2015-10-15 DIAGNOSIS — E876 Hypokalemia: Secondary | ICD-10-CM | POA: Diagnosis not present

## 2015-10-15 DIAGNOSIS — R000 Tachycardia, unspecified: Secondary | ICD-10-CM | POA: Diagnosis not present

## 2015-10-15 DIAGNOSIS — G4700 Insomnia, unspecified: Secondary | ICD-10-CM | POA: Diagnosis present

## 2015-10-15 DIAGNOSIS — Z9981 Dependence on supplemental oxygen: Secondary | ICD-10-CM | POA: Diagnosis not present

## 2015-10-15 DIAGNOSIS — Z978 Presence of other specified devices: Secondary | ICD-10-CM

## 2015-10-15 DIAGNOSIS — Z8249 Family history of ischemic heart disease and other diseases of the circulatory system: Secondary | ICD-10-CM

## 2015-10-15 DIAGNOSIS — Z96641 Presence of right artificial hip joint: Secondary | ICD-10-CM | POA: Diagnosis present

## 2015-10-15 DIAGNOSIS — Y95 Nosocomial condition: Secondary | ICD-10-CM | POA: Diagnosis present

## 2015-10-15 DIAGNOSIS — M069 Rheumatoid arthritis, unspecified: Secondary | ICD-10-CM | POA: Diagnosis not present

## 2015-10-15 DIAGNOSIS — Z833 Family history of diabetes mellitus: Secondary | ICD-10-CM

## 2015-10-15 DIAGNOSIS — E559 Vitamin D deficiency, unspecified: Secondary | ICD-10-CM | POA: Diagnosis not present

## 2015-10-15 DIAGNOSIS — J440 Chronic obstructive pulmonary disease with acute lower respiratory infection: Principal | ICD-10-CM | POA: Diagnosis present

## 2015-10-15 DIAGNOSIS — J441 Chronic obstructive pulmonary disease with (acute) exacerbation: Secondary | ICD-10-CM | POA: Diagnosis not present

## 2015-10-15 DIAGNOSIS — E43 Unspecified severe protein-calorie malnutrition: Secondary | ICD-10-CM | POA: Diagnosis not present

## 2015-10-15 DIAGNOSIS — Z743 Need for continuous supervision: Secondary | ICD-10-CM | POA: Diagnosis not present

## 2015-10-15 DIAGNOSIS — I110 Hypertensive heart disease with heart failure: Secondary | ICD-10-CM | POA: Diagnosis not present

## 2015-10-15 DIAGNOSIS — J302 Other seasonal allergic rhinitis: Secondary | ICD-10-CM | POA: Diagnosis not present

## 2015-10-15 DIAGNOSIS — F329 Major depressive disorder, single episode, unspecified: Secondary | ICD-10-CM | POA: Diagnosis present

## 2015-10-15 DIAGNOSIS — I10 Essential (primary) hypertension: Secondary | ICD-10-CM

## 2015-10-15 DIAGNOSIS — Z7982 Long term (current) use of aspirin: Secondary | ICD-10-CM

## 2015-10-15 DIAGNOSIS — J9602 Acute respiratory failure with hypercapnia: Secondary | ICD-10-CM

## 2015-10-15 DIAGNOSIS — E873 Alkalosis: Secondary | ICD-10-CM | POA: Diagnosis present

## 2015-10-15 DIAGNOSIS — R0602 Shortness of breath: Secondary | ICD-10-CM | POA: Diagnosis not present

## 2015-10-15 DIAGNOSIS — D539 Nutritional anemia, unspecified: Secondary | ICD-10-CM | POA: Diagnosis present

## 2015-10-15 DIAGNOSIS — D649 Anemia, unspecified: Secondary | ICD-10-CM | POA: Diagnosis present

## 2015-10-15 DIAGNOSIS — J449 Chronic obstructive pulmonary disease, unspecified: Secondary | ICD-10-CM | POA: Diagnosis not present

## 2015-10-15 DIAGNOSIS — R279 Unspecified lack of coordination: Secondary | ICD-10-CM | POA: Diagnosis not present

## 2015-10-15 DIAGNOSIS — Z7951 Long term (current) use of inhaled steroids: Secondary | ICD-10-CM

## 2015-10-15 DIAGNOSIS — J189 Pneumonia, unspecified organism: Secondary | ICD-10-CM | POA: Diagnosis present

## 2015-10-15 DIAGNOSIS — R2681 Unsteadiness on feet: Secondary | ICD-10-CM | POA: Diagnosis not present

## 2015-10-15 DIAGNOSIS — E58 Dietary calcium deficiency: Secondary | ICD-10-CM | POA: Diagnosis not present

## 2015-10-15 DIAGNOSIS — J90 Pleural effusion, not elsewhere classified: Secondary | ICD-10-CM | POA: Diagnosis not present

## 2015-10-15 DIAGNOSIS — R0600 Dyspnea, unspecified: Secondary | ICD-10-CM | POA: Diagnosis not present

## 2015-10-15 DIAGNOSIS — M6281 Muscle weakness (generalized): Secondary | ICD-10-CM | POA: Diagnosis not present

## 2015-10-15 DIAGNOSIS — R55 Syncope and collapse: Secondary | ICD-10-CM | POA: Diagnosis not present

## 2015-10-15 HISTORY — DX: Dependence on supplemental oxygen: Z99.81

## 2015-10-15 HISTORY — DX: Heart failure, unspecified: I50.9

## 2015-10-15 LAB — MRSA PCR SCREENING: MRSA by PCR: NEGATIVE

## 2015-10-15 LAB — BASIC METABOLIC PANEL
Anion gap: 5 (ref 5–15)
BUN: 17 mg/dL (ref 6–20)
CO2: 54 mmol/L — ABNORMAL HIGH (ref 22–32)
Calcium: 11 mg/dL — ABNORMAL HIGH (ref 8.9–10.3)
Chloride: 83 mmol/L — ABNORMAL LOW (ref 101–111)
Creatinine, Ser: 0.71 mg/dL (ref 0.44–1.00)
GFR calc Af Amer: >60 mL/min (ref >60)
Glucose, Bld: 109 mg/dL — ABNORMAL HIGH (ref 65–99)
POTASSIUM: 3.6 mmol/L (ref 3.5–5.1)
SODIUM: 142 mmol/L (ref 135–145)

## 2015-10-15 LAB — ETHANOL: Alcohol, Ethyl (B): <5 mg/dL (ref <5)

## 2015-10-15 LAB — BLOOD GAS, ARTERIAL
Acid-Base Excess: 26.6 mmol/L — ABNORMAL HIGH (ref 0.0–2.0)
BICARBONATE: 49.8 meq/L — AB (ref 20.0–24.0)
DELIVERY SYSTEMS: POSITIVE
DRAWN BY: 21310
Expiratory PAP: 6
FIO2: 35
Inspiratory PAP: 14
O2 SAT: 92.1 %
PATIENT TEMPERATURE: 37
PH ART: 7.383 (ref 7.350–7.450)
TCO2: 11.9 mmol/L (ref 0–100)
pCO2 arterial: 92.1 mmHg (ref 35.0–45.0)
pO2, Arterial: 65.4 mmHg — ABNORMAL LOW (ref 80.0–100.0)

## 2015-10-15 LAB — TROPONIN I

## 2015-10-15 LAB — CBC WITH DIFFERENTIAL/PLATELET
BASOS PCT: 0 %
Basophils Absolute: 0 10*3/uL (ref 0.0–0.1)
EOS ABS: 0 10*3/uL (ref 0.0–0.7)
Eosinophils Relative: 0 %
HCT: 34.6 % — ABNORMAL LOW (ref 36.0–46.0)
Hemoglobin: 9.7 g/dL — ABNORMAL LOW (ref 12.0–15.0)
Lymphocytes Relative: 11 %
Lymphs Abs: 0.7 10*3/uL (ref 0.7–4.0)
MCH: 29.7 pg (ref 26.0–34.0)
MCHC: 28 g/dL — AB (ref 30.0–36.0)
MCV: 105.8 fL — ABNORMAL HIGH (ref 78.0–100.0)
MONO ABS: 1.1 10*3/uL — AB (ref 0.1–1.0)
MONOS PCT: 17 %
NEUTROS PCT: 73 %
Neutro Abs: 4.8 10*3/uL (ref 1.7–7.7)
PLATELETS: 216 10*3/uL (ref 150–400)
RBC: 3.27 MIL/uL — ABNORMAL LOW (ref 3.87–5.11)
RDW: 12.9 % (ref 11.5–15.5)
WBC: 6.6 10*3/uL (ref 4.0–10.5)

## 2015-10-15 LAB — LACTIC ACID, PLASMA
LACTIC ACID, VENOUS: 0.9 mmol/L (ref 0.5–1.9)
Lactic Acid, Venous: 1.1 mmol/L (ref 0.5–1.9)

## 2015-10-15 LAB — BRAIN NATRIURETIC PEPTIDE: B Natriuretic Peptide: 181 pg/mL — ABNORMAL HIGH (ref 0.0–100.0)

## 2015-10-15 MED ORDER — LORAZEPAM 2 MG/ML IJ SOLN
0.5000 mg | Freq: Once | INTRAMUSCULAR | Status: AC
  Administered 2015-10-15: 0.5 mg via INTRAVENOUS
  Filled 2015-10-15: qty 1

## 2015-10-15 MED ORDER — IPRATROPIUM BROMIDE 0.02 % IN SOLN
1.0000 mg | Freq: Once | RESPIRATORY_TRACT | Status: AC
  Administered 2015-10-15: 1 mg via RESPIRATORY_TRACT
  Filled 2015-10-15: qty 5

## 2015-10-15 MED ORDER — TIOTROPIUM BROMIDE MONOHYDRATE 18 MCG IN CAPS
18.0000 ug | ORAL_CAPSULE | Freq: Every morning | RESPIRATORY_TRACT | Status: DC
  Filled 2015-10-15: qty 5

## 2015-10-15 MED ORDER — ALBUTEROL (5 MG/ML) CONTINUOUS INHALATION SOLN
10.0000 mg/h | INHALATION_SOLUTION | Freq: Once | RESPIRATORY_TRACT | Status: AC
  Administered 2015-10-15: 10 mg/h via RESPIRATORY_TRACT
  Filled 2015-10-15: qty 20

## 2015-10-15 MED ORDER — METHYLPREDNISOLONE SODIUM SUCC 125 MG IJ SOLR
60.0000 mg | Freq: Four times a day (QID) | INTRAMUSCULAR | Status: DC
  Administered 2015-10-16 – 2015-10-20 (×19): 60 mg via INTRAVENOUS
  Filled 2015-10-15 (×19): qty 2

## 2015-10-15 MED ORDER — FLUTICASONE FUROATE-VILANTEROL 100-25 MCG/INH IN AEPB
1.0000 | INHALATION_SPRAY | Freq: Every day | RESPIRATORY_TRACT | Status: DC
  Administered 2015-10-20 – 2015-10-25 (×6): 1 via RESPIRATORY_TRACT
  Filled 2015-10-15: qty 28

## 2015-10-15 MED ORDER — ASPIRIN 325 MG PO TABS
325.0000 mg | ORAL_TABLET | Freq: Every day | ORAL | Status: DC
  Administered 2015-10-21 – 2015-10-25 (×5): 325 mg via ORAL
  Filled 2015-10-15 (×5): qty 1

## 2015-10-15 MED ORDER — AMITRIPTYLINE HCL 25 MG PO TABS
50.0000 mg | ORAL_TABLET | Freq: Every day | ORAL | Status: DC
  Administered 2015-10-16 – 2015-10-24 (×7): 50 mg via ORAL
  Filled 2015-10-15 (×7): qty 2

## 2015-10-15 MED ORDER — ALBUTEROL SULFATE (2.5 MG/3ML) 0.083% IN NEBU: 2.5000 mg | INHALATION_SOLUTION | RESPIRATORY_TRACT | Status: DC

## 2015-10-15 MED ORDER — LEVALBUTEROL HCL 0.63 MG/3ML IN NEBU
INHALATION_SOLUTION | RESPIRATORY_TRACT | Status: AC
  Administered 2015-10-15: 0.63 mg
  Filled 2015-10-15: qty 3

## 2015-10-15 MED ORDER — DEXTROSE 5 % IV SOLN
1.0000 g | INTRAVENOUS | Status: DC
  Administered 2015-10-16 – 2015-10-19 (×5): 1 g via INTRAVENOUS
  Filled 2015-10-15 (×7): qty 10

## 2015-10-15 MED ORDER — LORAZEPAM 2 MG/ML IJ SOLN
1.0000 mg | INTRAMUSCULAR | Status: DC | PRN
  Administered 2015-10-15 – 2015-10-24 (×7): 1 mg via INTRAVENOUS
  Filled 2015-10-15 (×7): qty 1

## 2015-10-15 MED ORDER — DEXTROSE 5 % IV SOLN
1.0000 g | Freq: Once | INTRAVENOUS | Status: AC
  Administered 2015-10-15: 1 g via INTRAVENOUS
  Filled 2015-10-15: qty 10

## 2015-10-15 MED ORDER — LORATADINE 10 MG PO TABS
10.0000 mg | ORAL_TABLET | Freq: Every day | ORAL | Status: DC
  Administered 2015-10-21 – 2015-10-25 (×5): 10 mg via ORAL
  Filled 2015-10-15 (×5): qty 1

## 2015-10-15 MED ORDER — ENOXAPARIN SODIUM 40 MG/0.4ML ~~LOC~~ SOLN
40.0000 mg | SUBCUTANEOUS | Status: DC
  Administered 2015-10-16 – 2015-10-25 (×10): 40 mg via SUBCUTANEOUS
  Filled 2015-10-15 (×10): qty 0.4

## 2015-10-15 MED ORDER — DEXTROSE 5 % IV SOLN
500.0000 mg | Freq: Once | INTRAVENOUS | Status: AC
  Administered 2015-10-15: 500 mg via INTRAVENOUS
  Filled 2015-10-15: qty 500

## 2015-10-15 MED ORDER — METHYLPREDNISOLONE SODIUM SUCC 125 MG IJ SOLR
125.0000 mg | Freq: Once | INTRAMUSCULAR | Status: AC
  Administered 2015-10-15: 125 mg via INTRAVENOUS
  Filled 2015-10-15: qty 2

## 2015-10-15 MED ORDER — SODIUM CHLORIDE 0.9 % IV SOLN
INTRAVENOUS | Status: AC
  Administered 2015-10-16: 03:00:00 via INTRAVENOUS

## 2015-10-15 MED ORDER — GUAIFENESIN 200 MG PO TABS
400.0000 mg | ORAL_TABLET | Freq: Every day | ORAL | Status: DC | PRN
  Filled 2015-10-15: qty 2

## 2015-10-15 MED ORDER — HYDROCODONE-ACETAMINOPHEN 10-325 MG PO TABS
1.0000 | ORAL_TABLET | Freq: Four times a day (QID) | ORAL | Status: DC | PRN
  Administered 2015-10-22 – 2015-10-24 (×3): 1 via ORAL
  Filled 2015-10-15 (×3): qty 1

## 2015-10-15 MED ORDER — IPRATROPIUM-ALBUTEROL 0.5-2.5 (3) MG/3ML IN SOLN
3.0000 mL | RESPIRATORY_TRACT | Status: DC | PRN
  Administered 2015-10-16: 3 mL via RESPIRATORY_TRACT
  Filled 2015-10-15: qty 3

## 2015-10-15 MED ORDER — SODIUM CHLORIDE 0.9 % IV SOLN
INTRAVENOUS | Status: DC
  Administered 2015-10-15: 22:00:00 via INTRAVENOUS

## 2015-10-15 MED ORDER — VITAMIN D 1000 UNITS PO TABS
1000.0000 [IU] | ORAL_TABLET | Freq: Every day | ORAL | Status: DC
  Administered 2015-10-21 – 2015-10-25 (×5): 1000 [IU] via ORAL
  Filled 2015-10-15 (×5): qty 1

## 2015-10-15 MED ORDER — SODIUM CHLORIDE 0.9 % IV SOLN
INTRAVENOUS | Status: DC
Start: 2015-10-15 — End: 2015-10-15
  Administered 2015-10-15: 23:00:00 via INTRAVENOUS

## 2015-10-15 MED ORDER — METOPROLOL TARTRATE 50 MG PO TABS
50.0000 mg | ORAL_TABLET | Freq: Two times a day (BID) | ORAL | Status: DC
Start: 2015-10-15 — End: 2015-10-25
  Administered 2015-10-16 – 2015-10-25 (×13): 50 mg via ORAL
  Filled 2015-10-15 (×13): qty 1

## 2015-10-15 MED ORDER — BUPROPION HCL ER (XL) 150 MG PO TB24
150.0000 mg | ORAL_TABLET | Freq: Every day | ORAL | Status: DC
  Administered 2015-10-22 – 2015-10-24 (×3): 150 mg via ORAL
  Filled 2015-10-15 (×13): qty 1

## 2015-10-15 MED ORDER — DEXTROSE 5 % IV SOLN
500.0000 mg | INTRAVENOUS | Status: AC
  Administered 2015-10-16 – 2015-10-22 (×7): 500 mg via INTRAVENOUS
  Filled 2015-10-15 (×7): qty 500

## 2015-10-15 NOTE — ED Provider Notes (Signed)
[facility] DEPT Provider Note   CSN: [id number] Arrival date & time: 10/15/15  1548  First Provider Contact:  None       History   Chief Complaint Chief Complaint  Patient presents with  . Shortness of Breath    HPI [name] is a 70 y.o. female.  HPI  Pt was seen at 1600.  Per EMS, pt's husband, and pt, c/o gradual onset and worsening of persistent cough, wheezing and SOB for the past 2 days.  Describes her cough as "congested." Has been using home MDI without relief. Pt's husband states pt "passed out twice" today.  Denies CP/palpitations, no back pain, no abd pain, no N/V/D, no fevers, no rash.    Past Medical History:  Diagnosis Date  . Anxiety disorder   . Atrial fibrillation ([place])   . Avascular necrosis of hip ([place])    left  . CHF (congestive heart failure) ([place])   . COPD (chronic obstructive pulmonary disease) ([place])   . ETOH abuse   . HTN (hypertension)   . Hyperglycemia   . On home O2    3L N/C  . Psoriasis     Patient Active Problem List   Diagnosis Date Noted  . Atherosclerotic peripheral vascular disease with intermittent claudication ([place]) 06/09/2014  . FTT (failure to thrive) in adult 07/31/2013  . CHF (congestive heart failure) ([place]) 06/10/2013  . 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 Osteopenia 06/16/2010  . Hyperlipidemia 06/16/2010  . HTN (hypertension) 06/16/2010  . Insomnia 06/16/2010  . RA (rheumatoid arthritis) ([place]) 06/16/2010  . RHEUMATIC FEVER, HX OF 04/20/2009  . COMPUTERIZED TOMOGRAPHY, CHEST, ABNORMAL 04/20/2009  . COPD bronchitis 04/18/2009    Past Surgical History:  Procedure Laterality Date  . JOINT REPLACEMENT    . TONSILLECTOMY    . TOTAL HIP ARTHROPLASTY  05-15-08   r hip     Home Medications    Prior to Admission medications   Medication Sig Start Date End Date Taking? Authorizing Provider  adalimumab (HUMIRA PEN) 40 MG/0.8ML injection Inject 40 mg into the skin every 14 (fourteen) days.     Historical Provider,  MD  albuterol (PROAIR HFA) 108 (90 BASE) MCG/ACT inhaler INHALE 2 PUFFS INTO THE LUNGS EVERY 6 HOURS AS NEEDED FOR WHEEZING OR SHORTNESS OF BREATH 03/07/15   Mary-Margaret Hassell Done, FNP  albuterol (PROVENTIL) (2.5 MG/3ML) 0.083% nebulizer solution USE ONE VIAL IN NEBULIZER EVERY 6 HOURS AS NEEDED FOR WHEEZING AND FOR SHORTNESS OF BREATH 12/01/14   Mary-Margaret Hassell Done, FNP  ALPRAZolam Duanne Moron) 0.5 MG tablet Take 1 tablet (0.5 mg total) by mouth 2 (two) times daily. 07/17/15   Mary-Margaret Hassell Done, FNP  amitriptyline (ELAVIL) 50 MG tablet Take 1 tablet by mouth at  bedtime 12/23/14   Mary-Margaret Hassell Done, FNP  aspirin 325 MG tablet Take 325 mg by mouth daily.      Historical Provider, MD  buPROPion (WELLBUTRIN XL) 150 MG 24 hr tablet Take 1 tablet by mouth  daily 10/04/15   Mary-Margaret Hassell Done, FNP  cetirizine (ALL DAY ALLERGY) 10 MG tablet Take 10 mg by mouth daily.    Historical Provider, MD  cholecalciferol (VITAMIN D) 1000 UNITS tablet Take 1,000 Units by mouth daily.    Historical Provider, MD  EQL CALCIUM CITRATE/VITAMIN D PO Take by mouth. Vitamin D 500IU with Calcium Citrate 630mg     Historical Provider, MD  Fluticasone Furoate-Vilanterol (BREO ELLIPTA) 100-25 MCG/INH AEPB Inhale into the lungs.    Historical Provider, MD  furosemide (LASIX) 20 MG tablet  Take 1 tablet by mouth  daily 10/04/15   Mary-Margaret Hassell Done, FNP  guaifenesin (MUCUS RELIEF) 400 MG TABS tablet Take 400 mg by mouth daily as needed (for ocngestion).    Historical Provider, MD  HYDROcodone-acetaminophen (NORCO) 10-325 MG tablet Take 1 tablet by mouth every 8 (eight) hours as needed. 05/25/15   Mary-Margaret Hassell Done, FNP  metoprolol (LOPRESSOR) 50 MG tablet Take 1 tablet by mouth two  times daily 10/04/15   Mary-Margaret Hassell Done, FNP  potassium chloride SA (K-DUR,KLOR-CON) 20 MEQ tablet Take 2 tablets (40 mEq total) by mouth 2 (two) times daily. 03/31/14 04/01/14  Katheren Shams, MD  tiotropium (SPIRIVA) 18 MCG inhalation capsule Place 1  capsule (18 mcg total) into inhaler and inhale every morning. 06/26/15   Mary-Margaret Hassell Done, FNP    Family History Family History  Problem Relation Age of Onset  . Heart disease Father   . Hyperlipidemia Father   . Hypertension Father   . Heart attack Father   . Cancer Paternal Grandfather   . Alzheimer's disease Mother   . Diabetes Sister   . Hyperlipidemia Sister   . Hypertension Sister   . Heart attack Sister   . Peripheral vascular disease Sister   . Heart disease Brother   . Hyperlipidemia Brother   . Heart attack Brother     Social History Social History  Substance Use Topics  . Smoking status: Former Smoker    Packs/day: 1.00    Years: 35.00    Quit date: 02/15/2009  . Smokeless tobacco: Never Used  . Alcohol use 0.0 oz/week     Comment: hx of abuse and withdrawal     Allergies   Celexa [citalopram hydrobromide]; Nickel; and Tramadol hcl   Review of Systems Review of Systems ROS: Statement: All systems negative except as marked or noted in the HPI; Constitutional: Negative for fever and chills. ; ; Eyes: Negative for eye pain, redness and discharge. ; ; ENMT: Negative for ear pain, hoarseness, nasal congestion, sinus pressure and sore throat. ; ; Cardiovascular: Negative for chest pain, palpitations, diaphoresis, and peripheral edema. ; ; Respiratory: +cough, wheezing, SOB. Negative for stridor. ; ; Gastrointestinal: Negative for nausea, vomiting, diarrhea, abdominal pain, blood in stool, hematemesis, jaundice and rectal bleeding. . ; ; Genitourinary: Negative for dysuria, flank pain and hematuria. ; ; Musculoskeletal: Negative for back pain and neck pain. Negative for swelling and trauma.; ; Skin: Negative for pruritus, rash, abrasions, blisters, bruising and skin lesion.; ; Neuro: Negative for headache, lightheadedness and neck stiffness. Negative for weakness, extremity weakness, paresthesias, involuntary movement, seizure and +syncope.      Physical  Exam Updated Vital Signs BP 140/68   Pulse 116   Temp 98 F (36.7 C) (Oral)   Resp 21   Ht 5' (1.524 m)   Wt 100 lb (45.4 kg)   SpO2 99%   BMI 19.53 kg/m   Physical Exam 1605: Physical examination:  Nursing notes reviewed; Vital signs and O2 SAT reviewed;  Constitutional: Well developed, Well nourished, Well hydrated, Uncomfortable appearing.; Head:  Normocephalic, atraumatic; Eyes: EOMI, PERRL, No scleral icterus; ENMT: Mouth and pharynx normal, Mucous membranes moist; Neck: Supple, Full range of motion, No lymphadenopathy; Cardiovascular: Regular rate and rhythm, No gallop; Respiratory: Breath sounds diminished & equal bilaterally, faint wheezes. Speaking few words, tachypneic, sitting upright. +coughing up thick yellow-green sputum.;;; Chest: Nontender, Movement normal; Abdomen: Soft, Nontender, Nondistended, Normal bowel sounds; Genitourinary: No CVA tenderness; Extremities: Pulses normal, No tenderness, No edema, No calf edema or  asymmetry.; Neuro: AA&Ox3, Major CN grossly intact.  Speech clear. No gross focal motor deficits in extremities.; Skin: Color normal, Warm, Dry.   ED Treatments / Results  Labs (all labs ordered are listed, but only abnormal results are displayed)   EKG  EKG Interpretation  Date/Time:  Sunday October 15 2015 15:58:57 EDT Ventricular Rate:  130 PR Interval:    QRS Duration: 85 QT Interval:  310 QTC Calculation: 456 R Axis:   78 Text Interpretation:  Age not entered, assumed to be  70 years old for purpose of ECG interpretation Sinus tachycardia Multiform ventricular premature complexes Nonspecific T abnormalities, lateral leads Baseline wander Artifact When compared with ECG of 07/29/2013 Rate faster Confirmed by [facility]  MD, Nunzio Cory [phone]) on 10/15/2015 4:48:50 PM       Radiology   Procedures Procedures (including critical care time)  Medications Ordered in ED Medications  albuterol (PROVENTIL,VENTOLIN) solution continuous neb (not  administered)  ipratropium (ATROVENT) nebulizer solution 1 mg (not administered)  methylPREDNISolone sodium succinate (SOLU-MEDROL) 125 mg/2 mL injection 125 mg (125 mg Intravenous Given 10/15/15 1648)     Initial Impression / Assessment and Plan / ED Course  I have reviewed the triage vital signs and the nursing notes.  Pertinent labs & imaging results that were available during my care of the patient were reviewed by me and considered in my medical decision making (see chart for details).  MDM Reviewed: previous chart, nursing note and vitals Reviewed previous: labs and ECG Interpretation: labs, x-ray and ECG Total time providing critical care: 30-74 minutes. This excludes time spent performing separately reportable procedures and services. Consults: admitting MD   CRITICAL CARE Performed by: Alfonzo Feller Total critical care time: 35 minutes Critical care time was exclusive of separately billable procedures and treating other patients. Critical care was necessary to treat or prevent imminent or life-threatening deterioration. Critical care was time spent personally by me on the following activities: development of treatment plan with patient and/or surrogate as well as nursing, discussions with consultants, evaluation of patient's response to treatment, examination of patient, obtaining history from patient or surrogate, ordering and performing treatments and interventions, ordering and review of laboratory studies, ordering and review of radiographic studies, pulse oximetry and re-evaluation of patient's condition.   Results for orders placed or performed during the hospital encounter of [id number]  Basic metabolic panel  Result Value Ref Range   Sodium 142 135 - 145 mmol/L   Potassium 3.6 3.5 - 5.1 mmol/L   Chloride 83 (L) 101 - 111 mmol/L   CO2 54 (H) 22 - 32 mmol/L   Glucose, Bld 109 (H) 65 - 99 mg/dL   BUN 17 6 - 20 mg/dL   Creatinine, Ser 0.71 0.44 - 1.00 mg/dL    Calcium 11.0 (H) 8.9 - 10.3 mg/dL   GFR calc non Af Amer >60 >60 mL/min   GFR calc Af Amer >60 >60 mL/min   Anion gap 5 5 - 15  Troponin I  Result Value Ref Range   Troponin I <0.03 <0.03 ng/mL  Brain natriuretic peptide  Result Value Ref Range   B Natriuretic Peptide 181.0 (H) 0.0 - 100.0 pg/mL  CBC with Differential  Result Value Ref Range   WBC 6.6 4.0 - 10.5 K/uL   RBC 3.27 (L) 3.87 - 5.11 MIL/uL   Hemoglobin 9.7 (L) 12.0 - 15.0 g/dL   HCT 34.6 (L) 36.0 - 46.0 %   MCV 105.8 (H) 78.0 - 100.0 fL   MCH 29.7  26.0 - 34.0 pg   MCHC 28.0 (L) 30.0 - 36.0 g/dL   RDW 12.9 11.5 - 15.5 %   Platelets 216 150 - 400 K/uL   Neutrophils Relative % 73 %   Neutro Abs 4.8 1.7 - 7.7 K/uL   Lymphocytes Relative 11 %   Lymphs Abs 0.7 0.7 - 4.0 K/uL   Monocytes Relative 17 %   Monocytes Absolute 1.1 (H) 0.1 - 1.0 K/uL   Eosinophils Relative 0 %   Eosinophils Absolute 0.0 0.0 - 0.7 K/uL   Basophils Relative 0 %   Basophils Absolute 0.0 0.0 - 0.1 K/uL   WBC Morphology ATYPICAL LYMPHOCYTES   Lactic acid, plasma  Result Value Ref Range   Lactic Acid, Venous 0.9 0.5 - 1.9 mmol/L  Ethanol  Result Value Ref Range   Alcohol, Ethyl (B) <5 <5 mg/dL   Dg Chest Port 1 View Result Date: 10/15/2015 CLINICAL DATA:  Shortness of Breath EXAM: PORTABLE CHEST 1 VIEW COMPARISON:  03/31/2014 FINDINGS: Cardiac shadow is stable. Biapical scarring is again seen. Increased density is noted in the right apex. This is new from the prior exam and may represent acute on chronic infiltrate. Short-term follow-up is recommended. Chronic changes are seen in the right lung base. No acute bony abnormality is noted. IMPRESSION: Increased density in the right upper lobe likely representing acute on chronic infiltrate. Followup PA and lateral chest X-ray is recommended in 3-4 weeks following trial of antibiotic therapy to ensure resolution and exclude underlying malignancy. Electronically Signed   By: Inez Catalina M.D.   On:  10/15/2015 16:52   1825:  On arrival: pt sitting upright, tachypneic, tachycardic, Sats 97 % on her usual O2 3L N/C, lungs diminished. IV solumedrol and hour long neb started. After neb: pt tachycardic, Sats 98 % on O2 3L N/C, lungs continue diminished. Pt continues to sit up right and appear tachypneic; will start Bipap. Pt's daughter states pt "is going to get claustrophobic and is going to need something for her nerves." Pt takes xanax daily; will give low dose of ativan.   1840:  Pt appears comfortable on bipap after IV ativan. Less tachypneic, Sats 98% on bipap. IV abx ordered for CAP. Dx and testing d/w pt and family.  Questions answered.  Verb understanding, agreeable to admit.  T/C to [facility] Dr. Myna Hidalgo, case discussed, including:  HPI, pertinent PM/SHx, VS/PE, dx testing, ED course and treatment:  Agreeable to admit, requests to write temporary orders, obtain ICU bed to team APAdmits.     Final Clinical Impressions(s) / ED Diagnoses   Final diagnoses:  None    New Prescriptions New Prescriptions   No medications on file     Francine Graven, DO 10/18/15 1302

## 2015-10-15 NOTE — Progress Notes (Signed)
eLink Physician-Brief Progress Note Patient Name: [name] DOB: [birth year] MRN: [id number]   Date of Service  10/15/2015  HPI/Events of Note  70 yr old female with history of COPD admitted with right upper lobe infiltrate consistent with pneumonia.  Patient noted on prior CT of chest to have significant upper lobe emphysema.  Patient presently being placed on Bipap.    eICU Interventions  ABG ordered now.  Await further eval by admitting service.     Intervention Category Evaluation Type: New Patient Evaluation  [name], P 10/15/2015, 9:55 PM

## 2015-10-15 NOTE — H&P (Signed)
History and Physical    [name] [id number] DOB: 01/07/1946 DOA: 10/15/2015  PCP: Chevis Pretty, FNP   Patient coming from: Home   Chief Complaint: Dyspnea, cough  HPI: [name] is a 70 y.o. female with medical history significant for COPD on 3 L/m nasal cannula at baseline, chronic diastolic CHF, hypertension, rheumatoid arthritis, depression, anxiety, and insomnia who presents to the emergency department with a couple days of progressive worsening dyspnea and new productive cough. Patient was reportedly in her usual state of health until approximately 2 days ago when she noticed worsening in her chronic dyspnea, particularly with any exertion. She also developed a cough at this time, productive of thick green sputum. Over the ensuing 2 days leading up to the admission, her symptoms progressively worsened and her husband reports that she "passed out" twice today. Patient denies passing out, reporting that she wasn't talking because she couldn't catch her breath. She denies chest pain, palpitations, lower extremity edema or tenderness, or orthopnea. There is been no recent long distance travel or sick contacts. She denies hemoptysis or pleuritic pain. She has no personal or family history of VTE.   ED Course: Upon arrival to the ED, patient is found to be afebrile, saturating adequately on 3 L/m nasal cannula, tachycardic to 1:30, and with vitals otherwise stable. EKG demonstrates sinus tachycardia with rate 1:30, PVCs, and nonspecific T-wave abnormality in the lateral leads. Chest x-ray demonstrates an increased density in the right upper lobe consistent with an acute on chronic infiltrate. Chemistry panels notable for a serum bicarbonate of 54 and hypercalcemia at 11.0. CBC is notable for a macrocytic anemia with hemoglobin of 9.7 and MCV of 105.8. Lactic acid is reassuring at 0.9, troponin is undetectable, and BNP is mildly elevated at 181. Patient was given 125 mg IV  Solu-Medrol, nebulized albuterol and ipratropium, and empiric Rocephin and azithromycin in the emergency department. She was placed on BiPAP in the ED, continued be tachycardic but with stable blood pressure and good oxygenation. She will be admitted to the ICU for ongoing evaluation and management of pneumonia with COPD exacerbation and acute on chronic hypercarbic respiratory failure.  Review of Systems:  All other systems reviewed and apart from HPI, are negative.  Past Medical History:  Diagnosis Date  . Anxiety disorder   . Atrial fibrillation ([place])   . Avascular necrosis of hip ([place])    left  . CHF (congestive heart failure) ([place])   . COPD (chronic obstructive pulmonary disease) ([place])   . ETOH abuse   . HTN (hypertension)   . Hyperglycemia   . On home O2    3L N/C  . Psoriasis     Past Surgical History:  Procedure Laterality Date  . JOINT REPLACEMENT    . TONSILLECTOMY    . TOTAL HIP ARTHROPLASTY  05-15-08   r hip     reports that she quit smoking about 6 years ago. She has a 35.00 pack-year smoking history. She has never used smokeless tobacco. She reports that she drinks alcohol. She reports that she does not use drugs.  Allergies  Allergen Reactions  . Celexa [Citalopram Hydrobromide]     Jerky movements  . Nickel   . Tramadol Hcl     REACTION: nausea and vomiting    Family History  Problem Relation Age of Onset  . Heart disease Father   . Hyperlipidemia Father   . Hypertension Father   . Heart attack Father   . Cancer Paternal  Grandfather   . Alzheimer's disease Mother   . Diabetes Sister   . Hyperlipidemia Sister   . Hypertension Sister   . Heart attack Sister   . Peripheral vascular disease Sister   . Heart disease Brother   . Hyperlipidemia Brother   . Heart attack Brother      Prior to Admission medications   Medication Sig Start Date End Date Taking? Authorizing Provider  adalimumab (HUMIRA PEN) 40 MG/0.8ML injection Inject 40 mg into the  skin every 14 (fourteen) days.     Historical Provider, MD  albuterol (PROAIR HFA) 108 (90 BASE) MCG/ACT inhaler INHALE 2 PUFFS INTO THE LUNGS EVERY 6 HOURS AS NEEDED FOR WHEEZING OR SHORTNESS OF BREATH 03/07/15   Mary-Margaret Hassell Done, FNP  albuterol (PROVENTIL) (2.5 MG/3ML) 0.083% nebulizer solution USE ONE VIAL IN NEBULIZER EVERY 6 HOURS AS NEEDED FOR WHEEZING AND FOR SHORTNESS OF BREATH 12/01/14   Mary-Margaret Hassell Done, FNP  ALPRAZolam Duanne Moron) 0.5 MG tablet Take 1 tablet (0.5 mg total) by mouth 2 (two) times daily. 07/17/15   Mary-Margaret Hassell Done, FNP  amitriptyline (ELAVIL) 50 MG tablet Take 1 tablet by mouth at  bedtime 12/23/14   Mary-Margaret Hassell Done, FNP  aspirin 325 MG tablet Take 325 mg by mouth daily.      Historical Provider, MD  buPROPion (WELLBUTRIN XL) 150 MG 24 hr tablet Take 1 tablet by mouth  daily 10/04/15   Mary-Margaret Hassell Done, FNP  cetirizine (ALL DAY ALLERGY) 10 MG tablet Take 10 mg by mouth daily.    Historical Provider, MD  cholecalciferol (VITAMIN D) 1000 UNITS tablet Take 1,000 Units by mouth daily.    Historical Provider, MD  EQL CALCIUM CITRATE/VITAMIN D PO Take by mouth. Vitamin D 500IU with Calcium Citrate 630mg     Historical Provider, MD  Fluticasone Furoate-Vilanterol (BREO ELLIPTA) 100-25 MCG/INH AEPB Inhale into the lungs.    Historical Provider, MD  furosemide (LASIX) 20 MG tablet Take 1 tablet by mouth  daily 10/04/15   Mary-Margaret Hassell Done, FNP  guaifenesin (MUCUS RELIEF) 400 MG TABS tablet Take 400 mg by mouth daily as needed (for ocngestion).    Historical Provider, MD  HYDROcodone-acetaminophen (NORCO) 10-325 MG tablet Take 1 tablet by mouth every 8 (eight) hours as needed. 05/25/15   Mary-Margaret Hassell Done, FNP  metoprolol (LOPRESSOR) 50 MG tablet Take 1 tablet by mouth two  times daily 10/04/15   Mary-Margaret Hassell Done, FNP  potassium chloride SA (K-DUR,KLOR-CON) 20 MEQ tablet Take 2 tablets (40 mEq total) by mouth 2 (two) times daily. 03/31/14 04/01/14  Katheren Shams, MD    tiotropium (SPIRIVA) 18 MCG inhalation capsule Place 1 capsule (18 mcg total) into inhaler and inhale every morning. 06/26/15   Mary-Margaret Hassell Done, FNP    Physical Exam: Vitals:   10/15/15 1931 10/15/15 2000 10/15/15 2124 10/15/15 2159  BP: 141/68 139/79    Pulse: 94 (!) 130  (!) 140  Resp: (!) 30 26  (!) 29  Temp:  98.7 F (37.1 C)    TempSrc:  Oral    SpO2: 91% 95% (P) 90% 94%  Weight:      Height:          Constitutional: In acute respiratory distress with tachypnea, accessory muscle recruitment  Eyes: PERTLA, lids and conjunctivae normal ENMT: Mucous membranes are dry. Posterior pharynx clear of any exudate or lesions.   Neck: normal, supple, no masses, no thyromegaly Respiratory: Diminished bilaterally, increased WOB, no crackles, rhonchi, or wheeze, no cyanosis.  Cardiovascular: Rate ~120 and regular. No extremity  edema. No significant JVD. Abdomen: No distension, no tenderness, no masses palpated. Bowel sounds normal.  Musculoskeletal: no clubbing / cyanosis. No joint deformity upper and lower extremities. Normal muscle tone.  Skin: no significant rashes, lesions, ulcers. Warm, dry, well-perfused. Neurologic: CN 2-12 grossly intact. Sensation intact, DTR normal. Strength 5/5 in all 4 limbs.  Psychiatric: Normal judgment and insight. Alert and oriented x 3. Normal mood and affect.     Labs on Admission: I have personally reviewed following labs and imaging studies  CBC:  Recent Labs Lab 10/15/15 1629  WBC 6.6  NEUTROABS 4.8  HGB 9.7*  HCT 34.6*  MCV 105.8*  PLT 123XX123   Basic Metabolic Panel:  Recent Labs Lab 10/15/15 1629  NA 142  K 3.6  CL 83*  CO2 54*  GLUCOSE 109*  BUN 17  CREATININE 0.71  CALCIUM 11.0*   GFR: Estimated Creatinine Clearance: 46.2 mL/min (by C-G formula based on SCr of 0.8 mg/dL). Liver Function Tests: No results for input(s): AST, ALT, ALKPHOS, BILITOT, PROT, ALBUMIN in the last 168 hours. No results for input(s): LIPASE,  AMYLASE in the last 168 hours. No results for input(s): AMMONIA in the last 168 hours. Coagulation Profile: No results for input(s): INR, PROTIME in the last 168 hours. Cardiac Enzymes:  Recent Labs Lab 10/15/15 1629  TROPONINI <0.03   BNP (last 3 results) No results for input(s): PROBNP in the last 8760 hours. HbA1C: No results for input(s): HGBA1C in the last 72 hours. CBG: No results for input(s): GLUCAP in the last 168 hours. Lipid Profile: No results for input(s): CHOL, HDL, LDLCALC, TRIG, CHOLHDL, LDLDIRECT in the last 72 hours. Thyroid Function Tests: No results for input(s): TSH, T4TOTAL, FREET4, T3FREE, THYROIDAB in the last 72 hours. Anemia Panel: No results for input(s): VITAMINB12, FOLATE, FERRITIN, TIBC, IRON, RETICCTPCT in the last 72 hours. Urine analysis:    Component Value Date/Time   COLORURINE YELLOW 07/29/2013 1915   APPEARANCEUR CLEAR 07/29/2013 1915   LABSPEC 1.020 07/29/2013 1915   PHURINE 6.0 07/29/2013 1915   GLUCOSEU NEGATIVE 07/29/2013 1915   HGBUR SMALL (A) 07/29/2013 1915   BILIRUBINUR NEGATIVE 07/29/2013 [place] 07/29/2013 1915   PROTEINUR TRACE (A) 07/29/2013 1915   UROBILINOGEN 0.2 07/29/2013 1915   NITRITE NEGATIVE 07/29/2013 1915   LEUKOCYTESUR NEGATIVE 07/29/2013 1915   Sepsis Labs: @LABRCNTIP (procalcitonin:4,lacticidven:4) )No results found for this or any previous visit (from the past 240 hour(s)).   Radiological Exams on Admission: Dg Chest Port 1 View  Result Date: 10/15/2015 CLINICAL DATA:  Shortness of Breath EXAM: PORTABLE CHEST 1 VIEW COMPARISON:  03/31/2014 FINDINGS: Cardiac shadow is stable. Biapical scarring is again seen. Increased density is noted in the right apex. This is new from the prior exam and may represent acute on chronic infiltrate. Short-term follow-up is recommended. Chronic changes are seen in the right lung base. No acute bony abnormality is noted. IMPRESSION: Increased density in the right  upper lobe likely representing acute on chronic infiltrate. Followup PA and lateral chest X-ray is recommended in 3-4 weeks following trial of antibiotic therapy to ensure resolution and exclude underlying malignancy. Electronically Signed   By: Inez Catalina M.D.   On: 10/15/2015 16:52   EKG: Independently reviewed. Sinus tachycardia (rate 130), PVC's, non-specific T-wave abnormality in lateral leads   Assessment/Plan  1. CAP  - CXR findings suggest PNA, correlates with clinical exam though no fever or leukocytosis present  - Lactic acid reassuring at 0.9; will trend PCT  -  Check sputum culture and GS, urine antigens for strep pneumo and legionella  - Continue empiric treatment with azithromycin and Rocephin while awaiting culture data  - Manage associated respiratory failure and COPD exacerbation as below    2. COPD with acute exacerbation and AoC hypercarbic respiratory failure  - Requires 3 Lpm supplemental oxygen at baseline  - Serum bicarb 54 on admission, up from prior measurements in 40-range  - Requiring BiPAP currently, admitted to ICU - Mentating well  - Given Solu-Medrol 125 mg IV in ED, will continue with 60 mg q6h  - Continue DuoNebs q2h prn  - Continue home inhalers, Spiriva and Breo Ellipta  - Continuous pulse oximetry with titration of FiO2 to maintain sats 92%   3. Hypertension  - At goal currently  - Continue current management with Lopressor    4. Anxiety, depression, insomnia  - Anxiety exacerbated by current illness; will treat with prn Ativan IV while NPO d/t BiPAP - Continue home-dose Wellbutrin and Elavil    5. Chronic diastolic CHF  - Appears dry on admission, gently hydrating with NS  - TTE (08/02/13) with EF [id number], grade 1 diastolic dysfunction  - Hold Lasix while hydrating, continue Lopressor  - Follow daily wts and I/O's; resume Lasix as appropriate   6. Rheumatoid arthritis  - Stable - Managed with Humira   7. Macrocytic anemia  - Hgb 9.7 on  admission with MCV of 105.8  - No suggestion of active blood-loss  - Check B12, folate, and iron studies; supplement prn    8. Tachycardia  - Rate has been in 130-range since presentation  - Suspect this is secondary to high-doses of albuterol, dehydration, and anxiety  - Providing a gentle IV hydration and treating anxiety prn  - If tachycardia persists despite hydration and treatment of anxiety, consider ruling-out PE with d-dimer as pre-test probability low      DVT prophylaxis: sq Lovenox  Code Status: Full  Family Communication: Husband and daughter updated at bedside  Disposition Plan: Admit to ICU  Consults called: None  Admission status: Inpatient    Vianne Bulls, MD [facility] Pager [phone]  If 7PM-7AM, please contact night-coverage [email or web address] Password [email or web address]  10/15/2015, 11:14 PM

## 2015-10-15 NOTE — ED Triage Notes (Addendum)
Per EMS called out in reference to shortness of breath. At time of their arrival to pt residence, pt husband reported pt had two syncopal episodes. pt reports ever since waking up pt breathing has felt different. Productive congested cough noted at time of arrival. Thick sputum production noted in emesis bag. nad noted. Pt denies any pain. Pt alert and able to speak clear sentences. Pursed lip breathing noted at time of arrival. Pt reports is normally on 3liters at home.

## 2015-10-16 ENCOUNTER — Inpatient Hospital Stay (HOSPITAL_COMMUNITY): Payer: Medicare Other

## 2015-10-16 ENCOUNTER — Inpatient Hospital Stay (HOSPITAL_COMMUNITY): Payer: Medicare Other | Admitting: Anesthesiology

## 2015-10-16 LAB — BASIC METABOLIC PANEL
ANION GAP: 7 (ref 5–15)
BUN: 17 mg/dL (ref 6–20)
CHLORIDE: 86 mmol/L — AB (ref 101–111)
CO2: 50 mmol/L — ABNORMAL HIGH (ref 22–32)
Calcium: 10.4 mg/dL — ABNORMAL HIGH (ref 8.9–10.3)
Creatinine, Ser: 0.69 mg/dL (ref 0.44–1.00)
GFR calc Af Amer: >60 mL/min (ref >60)
Glucose, Bld: 153 mg/dL — ABNORMAL HIGH (ref 65–99)
POTASSIUM: 3.7 mmol/L (ref 3.5–5.1)
SODIUM: 143 mmol/L (ref 135–145)

## 2015-10-16 LAB — CBC WITH DIFFERENTIAL/PLATELET
BASOS ABS: 0 10*3/uL (ref 0.0–0.1)
Basophils Relative: 0 %
EOS PCT: 0 %
Eosinophils Absolute: 0 10*3/uL (ref 0.0–0.7)
HEMATOCRIT: 31 % — AB (ref 36.0–46.0)
HEMOGLOBIN: 9.1 g/dL — AB (ref 12.0–15.0)
LYMPHS PCT: 11 %
Lymphs Abs: 0.6 10*3/uL — ABNORMAL LOW (ref 0.7–4.0)
MCH: 30.5 pg (ref 26.0–34.0)
MCHC: 29.4 g/dL — ABNORMAL LOW (ref 30.0–36.0)
MCV: 104 fL — AB (ref 78.0–100.0)
MONOS PCT: 4 %
Monocytes Absolute: 0.2 10*3/uL (ref 0.1–1.0)
Neutro Abs: 4.2 10*3/uL (ref 1.7–7.7)
Neutrophils Relative %: 85 %
Platelets: 215 10*3/uL (ref 150–400)
RBC: 2.98 MIL/uL — AB (ref 3.87–5.11)
RDW: 12.9 % (ref 11.5–15.5)
WBC: 5 10*3/uL (ref 4.0–10.5)

## 2015-10-16 LAB — BLOOD GAS, ARTERIAL
Acid-Base Excess: 23.1 mmol/L — ABNORMAL HIGH (ref 0.0–2.0)
BICARBONATE: 46.5 meq/L — AB (ref 20.0–24.0)
DRAWN BY: 23534
FIO2: 0.5
LHR: 14 {breaths}/min
MECHVT: 500 mL
O2 CONTENT: 50 L/min
O2 Saturation: 99.8 %
PCO2 ART: 67.1 mmHg — AB (ref 35.0–45.0)
PEEP: 5 cmH2O
pH, Arterial: 7.475 — ABNORMAL HIGH (ref 7.350–7.450)
pO2, Arterial: 214 mmHg — ABNORMAL HIGH (ref 80.0–100.0)

## 2015-10-16 LAB — PROCALCITONIN: Procalcitonin: 0.22 ng/mL

## 2015-10-16 LAB — IRON AND TIBC
IRON: 21 ug/dL — AB (ref 28–170)
Saturation Ratios: 11 % (ref 10.4–31.8)
TIBC: 192 ug/dL — ABNORMAL LOW (ref 250–450)
UIBC: 171 ug/dL

## 2015-10-16 LAB — EXPECTORATED SPUTUM ASSESSMENT W GRAM STAIN, RFLX TO RESP C

## 2015-10-16 LAB — VITAMIN B12: VITAMIN B 12: 295 pg/mL (ref 180–914)

## 2015-10-16 LAB — STREP PNEUMONIAE URINARY ANTIGEN: Strep Pneumo Urinary Antigen: NEGATIVE

## 2015-10-16 LAB — EXPECTORATED SPUTUM ASSESSMENT W REFEX TO RESP CULTURE

## 2015-10-16 MED ORDER — FENTANYL CITRATE (PF) 100 MCG/2ML IJ SOLN: 25.0000 ug | INTRAMUSCULAR | Status: DC | PRN

## 2015-10-16 MED ORDER — SODIUM CHLORIDE 0.9 % IV SOLN
25.0000 ug/h | INTRAVENOUS | Status: DC
  Administered 2015-10-16: 25 ug/h via INTRAVENOUS
  Administered 2015-10-18: 50 ug/h via INTRAVENOUS
  Filled 2015-10-16 (×3): qty 50

## 2015-10-16 MED ORDER — IPRATROPIUM-ALBUTEROL 0.5-2.5 (3) MG/3ML IN SOLN: 3.0000 mL | RESPIRATORY_TRACT | Status: DC

## 2015-10-16 MED ORDER — ETOMIDATE 2 MG/ML IV SOLN
INTRAVENOUS | Status: DC | PRN
  Administered 2015-10-16: 8 mg via INTRAVENOUS

## 2015-10-16 MED ORDER — PROPOFOL 1000 MG/100ML IV EMUL
0.0000 ug/kg/min | INTRAVENOUS | Status: DC
  Administered 2015-10-16: 10 ug/kg/min via INTRAVENOUS
  Administered 2015-10-16: 30 ug/kg/min via INTRAVENOUS
  Administered 2015-10-17 – 2015-10-19 (×4): 20 ug/kg/min via INTRAVENOUS
  Filled 2015-10-16: qty 100
  Filled 2015-10-16: qty 200
  Filled 2015-10-16 (×5): qty 100

## 2015-10-16 MED ORDER — DEXTROSE 5 % IV SOLN
INTRAVENOUS | Status: AC
  Filled 2015-10-16: qty 10

## 2015-10-16 MED ORDER — ANTISEPTIC ORAL RINSE SOLUTION (CORINZ)
7.0000 mL | Freq: Four times a day (QID) | OROMUCOSAL | Status: DC
  Administered 2015-10-16: 7 mL via OROMUCOSAL

## 2015-10-16 MED ORDER — GUAIFENESIN ER 600 MG PO TB12: 600.0000 mg | ORAL_TABLET | Freq: Every day | ORAL | Status: DC | PRN

## 2015-10-16 MED ORDER — FENTANYL CITRATE (PF) 100 MCG/2ML IJ SOLN
50.0000 ug | Freq: Once | INTRAMUSCULAR | Status: AC
  Administered 2015-10-16: 50 ug via INTRAVENOUS

## 2015-10-16 MED ORDER — IPRATROPIUM BROMIDE 0.02 % IN SOLN
0.5000 mg | Freq: Four times a day (QID) | RESPIRATORY_TRACT | Status: DC
  Administered 2015-10-16 – 2015-10-22 (×25): 0.5 mg via RESPIRATORY_TRACT
  Filled 2015-10-16 (×25): qty 2.5

## 2015-10-16 MED ORDER — FENTANYL CITRATE (PF) 100 MCG/2ML IJ SOLN
100.0000 ug | INTRAMUSCULAR | Status: AC | PRN
  Administered 2015-10-16 (×3): 100 ug via INTRAVENOUS
  Filled 2015-10-16 (×3): qty 2

## 2015-10-16 MED ORDER — ACETAMINOPHEN 10 MG/ML IV SOLN
1000.0000 mg | Freq: Four times a day (QID) | INTRAVENOUS | Status: AC
  Administered 2015-10-17 (×3): 1000 mg via INTRAVENOUS
  Filled 2015-10-16 (×4): qty 100

## 2015-10-16 MED ORDER — METOPROLOL TARTRATE 5 MG/5ML IV SOLN
5.0000 mg | Freq: Once | INTRAVENOUS | Status: AC
  Administered 2015-10-16: 5 mg via INTRAVENOUS
  Filled 2015-10-16: qty 5

## 2015-10-16 MED ORDER — SUCCINYLCHOLINE CHLORIDE 20 MG/ML IJ SOLN
INTRAMUSCULAR | Status: DC | PRN
  Administered 2015-10-16: 60 mg via INTRAVENOUS

## 2015-10-16 MED ORDER — ANTISEPTIC ORAL RINSE SOLUTION (CORINZ)
7.0000 mL | Freq: Four times a day (QID) | OROMUCOSAL | Status: DC
  Administered 2015-10-16 – 2015-10-20 (×17): 7 mL via OROMUCOSAL

## 2015-10-16 MED ORDER — FENTANYL CITRATE (PF) 100 MCG/2ML IJ SOLN: 100.0000 ug | INTRAMUSCULAR | Status: DC | PRN

## 2015-10-16 MED ORDER — CHLORHEXIDINE GLUCONATE 0.12% ORAL RINSE (MEDLINE KIT)
15.0000 mL | Freq: Two times a day (BID) | OROMUCOSAL | Status: DC
  Administered 2015-10-16: 15 mL via OROMUCOSAL

## 2015-10-16 MED ORDER — LEVALBUTEROL HCL 0.63 MG/3ML IN NEBU
0.6300 mg | INHALATION_SOLUTION | Freq: Four times a day (QID) | RESPIRATORY_TRACT | Status: DC
  Administered 2015-10-16 – 2015-10-22 (×25): 0.63 mg via RESPIRATORY_TRACT
  Filled 2015-10-16 (×26): qty 3

## 2015-10-16 MED ORDER — LEVALBUTEROL HCL 0.63 MG/3ML IN NEBU: 0.6300 mg | INHALATION_SOLUTION | RESPIRATORY_TRACT | Status: DC | PRN

## 2015-10-16 MED ORDER — CHLORHEXIDINE GLUCONATE 0.12% ORAL RINSE (MEDLINE KIT)
15.0000 mL | Freq: Two times a day (BID) | OROMUCOSAL | Status: DC
  Administered 2015-10-16 – 2015-10-20 (×9): 15 mL via OROMUCOSAL

## 2015-10-16 MED ORDER — FAMOTIDINE IN NACL 20-0.9 MG/50ML-% IV SOLN
20.0000 mg | Freq: Two times a day (BID) | INTRAVENOUS | Status: DC
  Administered 2015-10-16 – 2015-10-25 (×18): 20 mg via INTRAVENOUS
  Filled 2015-10-16 (×21): qty 50

## 2015-10-16 NOTE — Anesthesia Procedure Notes (Signed)
Procedure Name: Intubation Performed by: Geanine Vandekamp A Pre-anesthesia Checklist: Patient identified, Emergency Drugs available, Suction available, Patient being monitored and Timeout performed Patient Re-evaluated:Patient Re-evaluated prior to inductionOxygen Delivery Method: Ambu bag Preoxygenation: Pre-oxygenation with 100% oxygen Intubation Type: IV induction Laryngoscope Size: Miller and 3 Grade View: Grade I Tube type: Oral Tube size: 7.0 mm Number of attempts: 1 Airway Equipment and Method: Stylet Placement Confirmation: ETT inserted through vocal cords under direct vision,  CO2 detector and breath sounds checked- equal and bilateral Secured at: 22 cm Tube secured with: Tape Dental Injury: Teeth and Oropharynx as per pre-operative assessment

## 2015-10-16 NOTE — Consult Note (Signed)
NAMESALMAI, CREGAN              ACCOUNT NO.:  [id number]  MEDICAL RECORD NO.:  [id number]  LOCATION:  IC10                          FACILITY:  [facility]  PHYSICIAN:  Hikeem Andersson L. Luan Pulling, M.D.DATE OF BIRTH:  Aug 22, 1945  DATE OF CONSULTATION: DATE OF DISCHARGE:                                CONSULTATION   The patient in the intensive care unit.  REASON FOR CONSULTATION:  Respiratory failure.  HISTORY:  This is a 70 year old, who has a long known history of COPD, chronic hypoxic respiratory failure, on 3 L of oxygen at home, chronic diastolic heart failure, hypertension, rheumatoid arthritis, anxiety, depression, insomnia, and significant weight loss.  She had been doing fairly well, but about 2 days prior to admission, she had increased shortness of breath and a cough.  She brought up thick green sputum. According to family, she passed out twice on the day of admission, and this apparently was related to being short of breath.  She had been started on BiPAP after admission and although her blood gas seemed to have improved, she was unresponsive, so she was intubated and Pulmonary consultation was requested to help with management of the ventilator and management of her COPD exacerbation, pneumonia, and respiratory failure. She is intubated, so she is not able to give any history.  I am getting most of the history from her son at bedside and from the medical record.  REVIEW OF SYSTEMS:  Not obtainable.  Her son says that he is not there all the time, but she has lost a significant amount of weight.  PAST MEDICAL HISTORY:  Positive for anxiety, atrial fibrillation, avascular necrosis of the hip, CHF, COPD, hypertension, psoriasis, and there was some question about arthritis.  She has had joint complaints with tonsillectomy.  She stopped smoking about 4 years ago according to family with about 70 pack year smoking history.  She does use alcohol occasionally.  Based on the medical  record, she does not use any other drugs.  ALLERGIES:  She is allergic to CITALOPRAM, NICKEL, and TRAMADOL.  FAMILY HISTORY:  Father had heart disease, grandfather had cancer, her mother had Alzheimer disease.  There is also diabetes in other family members and heart disease in other family members.  She is on multiple medications at home,which are listed and I have reviewed.  I have also reviewed her medications here in the hospital.  PHYSICAL EXAMINATION:  GENERAL:  Show that she is intubated on a ventilator, unresponsive, and sedated. HEENT:  Her pupils are reactive.  Nose and throat are clear.  Mucous membranes are moist. NECK:  Supple without masses. CHEST:  Clear with end expiratory wheezing. HEART:  Regular without gallop. ABDOMEN:  Soft, without masses. EXTREMITIES:  Showed no edema. CENTRAL NERVOUS SYSTEM:  Grossly intact.  IMAGING:  Chest x-ray is consistent with pneumonia.  Her endotracheal tube looks okay.  Blood gas post intubation shows pH 7.47, pCO2 of 67, PO2 of 214, on 50% oxygen.  ASSESSMENT:  She has respiratory failure on the basis of chronic obstructive pulmonary disease, respiratory failure, and pneumonia.  She is being appropriately treated for all of this.  I discussed with her son and told him that  I did not think that she would be ready to come off the ventilator tomorrow, but that we would go ahead and check,  but it would be unlikely that she would be ready.  I have adjusted her medications slightly.  She is on appropriate treatment.  Thanks for allowing me to see her with you.     Carianne Taira L. Luan Pulling, M.D.     ELH/MEDQ  D:  10/16/2015  T:  10/16/2015  Job:  [id number]

## 2015-10-16 NOTE — Progress Notes (Signed)
PT Cancellation Note  Patient Details Name: [name] MRN: [id number] DOB: 01-23-1946   Cancelled Treatment:    Reason Eval/Treat Not Completed: Patient not medically ready (Pt has just been intubated due to her elevated PCO2, as well as unresponsiveness.  PT will sign off at this time.  Please re-order when pt is able to participate in therapeutic interventions. )   Beth Cadie Sorci, PT, DPT X: [phone]

## 2015-10-16 NOTE — Clinical Social Work Note (Signed)
CSW received consult for COPD gold protocol. Pt does not meet criteria of 3 or more admissions in last 6 months. CSW will sign off, but can be reconsulted if needed.   Benay Pike, [place]

## 2015-10-16 NOTE — Progress Notes (Addendum)
[place] for Renal Adjustment of ABX if needed ABX:  Rocephin and Zithromax  Allergies  Allergen Reactions  . Celexa [Citalopram Hydrobromide]     Jerky movements  . Nickel   . Tramadol Hcl     REACTION: nausea and vomiting   Patient Measurements: Height: 5' (152.4 cm) Weight: 91 lb 7.9 oz (41.5 kg) IBW/kg (Calculated) : 45.5  Vital Signs: Temp: 98.5 F (36.9 C) (07/31 0800) Temp Source: Axillary (07/31 0800) BP: 162/113 (07/31 1110) Pulse Rate: 83 (07/31 1116)  Labs:  Recent Labs  10/15/15 1629 10/16/15 0433  WBC 6.6 5.0  HGB 9.7* 9.1*  PLT 216 215  CREATININE 0.71 0.69   No results for input(s): VANCOTROUGH, VANCOPEAK, VANCORANDOM, GENTTROUGH, GENTPEAK, GENTRANDOM, TOBRATROUGH, TOBRAPEAK, TOBRARND, AMIKACINPEAK, AMIKACINTROU, AMIKACIN in the last 72 hours.   Medical History: Past Medical History:  Diagnosis Date  . Anxiety disorder   . Atrial fibrillation ([place])   . Avascular necrosis of hip ([place])    left  . CHF (congestive heart failure) ([place])   . COPD (chronic obstructive pulmonary disease) ([place])   . ETOH abuse   . HTN (hypertension)   . Hyperglycemia   . On home O2    3L N/C  . Psoriasis    Assessment: 70yo female started on Rocephin and Zithromax.  No renal adjustment needed.  Estimated Creatinine Clearance: 42.3 mL/min (by C-G formula based on SCr of 0.8 mg/dL).  Plan: Continue current Rx F/U to switch Zithromax to PO  Hart Robinsons A, [facility] 10/16/2015  Addum:  No renal adjustment need for antibiotics.  Pharmacy will sign off.  Please reconsult as needed. Thank you, Excell Seltzer, PharmD

## 2015-10-16 NOTE — Care Management Important Message (Signed)
Important Message  Patient Details  Name: [name] MRN: [id number] Date of Birth: Oct 22, 1945   Medicare Important Message Given:  Yes    Sherald Barge, RN 10/16/2015, 3:04 PM

## 2015-10-16 NOTE — Progress Notes (Signed)
Patient is very restless on BiPAP, Hr increase 130's  Saturation is good 95, Patient is known PCO2 retainer on 3 lpm oxygen at home PCO2 runs high 70's baseline. Patient has been given sedation by nurse. Will continue to monitor.

## 2015-10-16 NOTE — Progress Notes (Signed)
PROGRESS NOTE    [name]  BTD:[id number] DOB: 1945-10-05 DOA: 10/15/2015 PCP: Chevis Pretty, FNP     Brief Narrative:  70 year old woman admitted on 7/30 from home with complaints of shortness of breath and cough. She was found to have community-acquired pneumonia. She does have a history of hypoxic/hypercarbic chronic respiratory failure due to COPD. She remains pretty much unresponsive with an elevated PCO2 despite being on BiPAP overnight, decision has been made to intubate her on 7/31. Dr. Luan Pulling is aware of patient, have discussed case with patient's daughter Clydene Laming.   Assessment & Plan:   Principal Problem:   CAP (community acquired pneumonia) Active Problems:   HTN (hypertension)   RA (rheumatoid arthritis) (HCC)   Acute and chronic respiratory failure with hypercapnia (HCC)   COPD with acute exacerbation (HCC)   Anxiety   Chronic diastolic CHF (congestive heart failure) (HCC)   Macrocytic anemia   Acute respiratory failure with hypoxia and hypercapnia (HCC)   Acute on chronic respiratory failure with hypoxia and hypercapnia -ABG on admission shows a pH of 7.38, PCO2 of 92, PO2 of 65 and a bicarbonate of 50. -This ABG shows that most of her respiratory failure is chronic, however given her unresponsiveness and non improvement on BiPAP, decision has been made to proceed with intubation. -Check CXR and ABG status post intubation.  Community-acquired pneumonia -Continue Rocephin/azithromycin. -Culture data is currently pending.  COPD with acute exacerbation -Continue steroids, nebs.   Chronic diastolic CHF -This appears compensated at present.   DVT prophylaxis: Lovenox Code Status: Full Code Family Communication: Discussed with son Sherren Mocha at bedside and daughter Helene Kelp via phone Disposition Plan: Keep in ICU, proceed with intubation  Consultants:   Pulmonary  Procedures:   Intubation 7/31  Antimicrobials:   Rocephin/Azithromycin     Subjective: On NIPPV, grimaces to sternal rub, otherwise unresponsive.  Objective: Vitals:   10/16/15 0700 10/16/15 0800 10/16/15 0900 10/16/15 1000  BP: (!) 144/79 (!) 152/75 (!) 154/84 (!) 159/79  Pulse: (!) 107 (!) 114 (!) 106 (!) 107  Resp: (!) 27 (!) 29 (!) 26 (!) 29  Temp:  98.5 F (36.9 C)    TempSrc:  Axillary    SpO2: 98% 100% 97% 99%  Weight:      Height:        Intake/Output Summary (Last 24 hours) at 10/16/15 1056 Last data filed at 10/16/15 0800  Gross per 24 hour  Intake              650 ml  Output                0 ml  Net              650 ml   Filed Weights   10/15/15 1551 10/16/15 0508  Weight: 45.4 kg (100 lb) 41.5 kg (91 lb 7.9 oz)    Examination:  General exam: unresponsive Respiratory system: Poor air movement, intercostal retractions Cardiovascular system:tachycardic, regular. No murmurs, rubs, gallops. Gastrointestinal system: Abdomen is nondistended, soft and nontender. No organomegaly or masses felt. Normal bowel sounds heard. Central nervous system: Unable to assess given current mental state Extremities: No C/C/E, +pedal pulses Skin: No rashes, lesions or ulcers Psychiatry: Unable to assess given current mental state    Data Reviewed: I have personally reviewed following labs and imaging studies  CBC:  Recent Labs Lab 10/15/15 1629 10/16/15 0433  WBC 6.6 5.0  NEUTROABS 4.8 4.2  HGB 9.7* 9.1*  HCT 34.6* 31.0*  MCV 105.8* 104.0*  PLT 216 008   Basic Metabolic Panel:  Recent Labs Lab 10/15/15 1629 10/16/15 0433  NA 142 143  K 3.6 3.7  CL 83* 86*  CO2 54* 50*  GLUCOSE 109* 153*  BUN 17 17  CREATININE 0.71 0.69  CALCIUM 11.0* 10.4*   GFR: Estimated Creatinine Clearance: 42.3 mL/min (by C-G formula based on SCr of 0.8 mg/dL). Liver Function Tests: No results for input(s): AST, ALT, ALKPHOS, BILITOT, PROT, ALBUMIN in the last 168 hours. No results for input(s): LIPASE, AMYLASE in the last 168 hours. No results for  input(s): AMMONIA in the last 168 hours. Coagulation Profile: No results for input(s): INR, PROTIME in the last 168 hours. Cardiac Enzymes:  Recent Labs Lab 10/15/15 1629  TROPONINI <0.03   BNP (last 3 results) No results for input(s): PROBNP in the last 8760 hours. HbA1C: No results for input(s): HGBA1C in the last 72 hours. CBG: No results for input(s): GLUCAP in the last 168 hours. Lipid Profile: No results for input(s): CHOL, HDL, LDLCALC, TRIG, CHOLHDL, LDLDIRECT in the last 72 hours. Thyroid Function Tests: No results for input(s): TSH, T4TOTAL, FREET4, T3FREE, THYROIDAB in the last 72 hours. Anemia Panel: No results for input(s): VITAMINB12, FOLATE, FERRITIN, TIBC, IRON, RETICCTPCT in the last 72 hours. Urine analysis:    Component Value Date/Time   COLORURINE YELLOW 07/29/2013 1915   APPEARANCEUR CLEAR 07/29/2013 1915   LABSPEC 1.020 07/29/2013 1915   PHURINE 6.0 07/29/2013 1915   GLUCOSEU NEGATIVE 07/29/2013 1915   HGBUR SMALL (A) 07/29/2013 1915   BILIRUBINUR NEGATIVE 07/29/2013 [place] 07/29/2013 1915   PROTEINUR TRACE (A) 07/29/2013 1915   UROBILINOGEN 0.2 07/29/2013 1915   NITRITE NEGATIVE 07/29/2013 1915   LEUKOCYTESUR NEGATIVE 07/29/2013 1915   Sepsis Labs: '@LABRCNTIP'$ (procalcitonin:4,lacticidven:4)  ) Recent Results (from the past 240 hour(s))  MRSA PCR Screening     Status: None   Collection Time: 10/15/15  9:54 PM  Result Value Ref Range Status   MRSA by PCR NEGATIVE NEGATIVE Final    Comment:        The GeneXpert MRSA Assay (FDA approved for NASAL specimens only), is one component of a comprehensive MRSA colonization surveillance program. It is not intended to diagnose MRSA infection nor to guide or monitor treatment for MRSA infections.          Radiology Studies: Dg Chest Port 1 View  Result Date: 10/15/2015 CLINICAL DATA:  Shortness of Breath EXAM: PORTABLE CHEST 1 VIEW COMPARISON:  03/31/2014 FINDINGS: Cardiac  shadow is stable. Biapical scarring is again seen. Increased density is noted in the right apex. This is new from the prior exam and may represent acute on chronic infiltrate. Short-term follow-up is recommended. Chronic changes are seen in the right lung base. No acute bony abnormality is noted. IMPRESSION: Increased density in the right upper lobe likely representing acute on chronic infiltrate. Followup PA and lateral chest X-ray is recommended in 3-4 weeks following trial of antibiotic therapy to ensure resolution and exclude underlying malignancy. Electronically Signed   By: Inez Catalina M.D.   On: 10/15/2015 16:52       Scheduled Meds: . amitriptyline  50 mg Oral QHS  . antiseptic oral rinse  7 mL Mouth Rinse QID  . aspirin  325 mg Oral Daily  . azithromycin  500 mg Intravenous Q24H  . buPROPion  150 mg Oral Daily  . cefTRIAXone (ROCEPHIN)  IV  1 g Intravenous Q24H  . chlorhexidine gluconate (  SAGE KIT)  15 mL Mouth Rinse BID  . cholecalciferol  1,000 Units Oral Daily  . enoxaparin (LOVENOX) injection  40 mg Subcutaneous Q24H  . fluticasone furoate-vilanterol  1 puff Inhalation Daily  . loratadine  10 mg Oral Daily  . methylPREDNISolone (SOLU-MEDROL) injection  60 mg Intravenous Q6H  . metoprolol  50 mg Oral BID  . tiotropium  18 mcg Inhalation q morning - 10a   Continuous Infusions:    LOS: 1 day    Time spent: 45 minutes. Greater than 50% of this time was spent in direct contact with the patient coordinating care.     Lelon Frohlich, MD [facility] Pager [phone]  If 7PM-7AM, please contact night-coverage [email or web address] Password [email or web address] 10/16/2015, 10:56 AM

## 2015-10-16 NOTE — Consult Note (Signed)
Full note dictated. Pt. Seen and eamined

## 2015-10-16 NOTE — Addendum Note (Signed)
Addendum  created 10/16/15 1243 by Mickel Baas, CRNA   Charge Capture section accepted

## 2015-10-16 NOTE — Progress Notes (Signed)
Initial Nutrition Assessment  INTERVENTION:  If pt is unable to wean within the goal of 24-48 hr recommend start tube feeds: Vital High Protein @ 40 ml (960 ml per day) which will provide 960 kcal, 84 gr protein and 802 ml water and which meets 102% of energy goal.  NUTRITION DIAGNOSIS:   Inadequate oral intake related to acute respiratory failure, pneumonia AEB pt failed BiPAP and has been intbuated  GOAL: Pt to meet est needs according to ASPEN/SCCM     MONITOR: respiratory status, tube feeding initiation, weight trends    REASON FOR ASSESSMENT:   Ventilator, Consult Assessment of nutrition requirement/status  ASSESSMENT:  [name] is a 70 y.o. female with medical history significant for COPD on 3 L/m nasal cannula at baseline, chronic diastolic CHF, hypertension, rheumatoid arthritis, depression, anxiety, and insomnia who presents to the emergency department with a couple days of progressive worsening dyspnea and new productive cough. Patient was reportedly in her usual state of health until approximately 2 days ago when she noticed worsening in her chronic dyspnea, particularly with any exertion. She also developed a cough at this time, productive of thick green sputum. Patient is currently intubated on ventilator support MV: 10.1 L/min Temp (24hrs), Avg:98 F (36.7 C), Min:97.3 F (36.3 C), Max:98.7 F (37.1 C)  Propofol has been titrated to: 6.2 ml/hr currently (which is providing 164 kcal daily lipids) if rate is unchanged for 24 hrs.   Recent Labs Lab 10/15/15 1629 10/16/15 0433  NA 142 143  K 3.6 3.7  CL 83* 86*  CO2 54* 50*  BUN 17 17  CREATININE 0.71 0.69  CALCIUM 11.0* 10.4*  GLUCOSE 109* 153*   Labs : reviewed  Unable to complete Nutrition-Focused physical exam at this time.    Diet Order:  Diet NPO time specified  Skin:   dry but has a blister noted by nursing.  Last BM:   prior to admission  Height:   Ht Readings from Last 1 Encounters:   10/15/15 5' (1.524 m)    Weight:   Wt Readings from Last 1 Encounters:  10/16/15 91 lb 7.9 oz (41.5 kg)    Ideal Body Weight:  45 kg  BMI:  Body mass index is 17.87 kg/m.  Estimated Nutritional Needs:   Kcal:  1098  Protein:  71-80 gr   Fluid:  1.1 liters daily  EDUCATION NEEDS: not appropriate at this time    Colman Cater MS,RD,CSG,LDN Office: [phone] Pager: [phone]

## 2015-10-16 NOTE — Progress Notes (Signed)
Patient awakens still very restless ; improvement is very little if any. Hr has slowed to 100's  Respirations 29 -32 . Will continue to monitor

## 2015-10-16 NOTE — Anesthesia Preprocedure Evaluation (Signed)
Anesthesia Evaluation  Patient identified by MRN, date of birth, ID band Patient confused    Reviewed: Allergy & Precautions, Patient's Chart, lab work & pertinent test resultsPreop documentation limited or incomplete due to emergent nature of procedure.  Airway Mallampati: I  TM Distance: >3 FB Neck ROM: Full    Dental  (+) Edentulous Upper, Missing   Pulmonary shortness of breath, pneumonia, COPD,  COPD inhaler, former smoker,     + wheezing      Cardiovascular + Peripheral Vascular Disease and +CHF   Rhythm:Regular     Neuro/Psych PSYCHIATRIC DISORDERS Anxiety    GI/Hepatic negative GI ROS, Neg liver ROS,   Endo/Other    Renal/GU Renal disease     Musculoskeletal  (+) Arthritis ,   Abdominal   Peds  Hematology  (+) anemia ,   Anesthesia Other Findings BiPAP since admission with hypoxia and hypercarbia (90s) in setting of suspected pneumonia  Reproductive/Obstetrics                             Anesthesia Physical Anesthesia Plan  ASA: IV and emergent  Anesthesia Plan: General   Post-op Pain Management:    Induction: Intravenous  Airway Management Planned: Oral ETT  Additional Equipment:   Intra-op Plan:   Post-operative Plan: Post-operative intubation/ventilation  Informed Consent:   Only emergency history available  Plan Discussed with: CRNA  Anesthesia Plan Comments:         Anesthesia Quick Evaluation

## 2015-10-17 ENCOUNTER — Inpatient Hospital Stay (HOSPITAL_COMMUNITY): Payer: Medicare Other

## 2015-10-17 LAB — BLOOD GAS, ARTERIAL
Acid-Base Excess: 20.3 mmol/L — ABNORMAL HIGH (ref 0.0–2.0)
Acid-Base Excess: 20 mmol/L — ABNORMAL HIGH (ref 0.0–2.0)
Allens test (pass/fail): POSITIVE — AB
Bicarbonate: 44.5 mEq/L — ABNORMAL HIGH (ref 20.0–24.0)
Bicarbonate: 44.3 mEq/L — ABNORMAL HIGH (ref 20.0–24.0)
Drawn by: 234301
FIO2: 0.35
FIO2: 35
LHR: 10 {breaths}/min
O2 SAT: 99.4 %
O2 Saturation: 99.6 %
PCO2 ART: 38.8 mmHg (ref 35.0–45.0)
PEEP: 5 cmH2O
PH ART: 7.658 — AB (ref 7.350–7.450)
PO2 ART: 142 mmHg — AB (ref 80.0–100.0)
Patient temperature: 37
VT: 400 mL
pCO2 arterial: 37.7 mmHg (ref 35.0–45.0)
pH, Arterial: 7.666 (ref 7.350–7.450)
pO2, Arterial: 146 mmHg — ABNORMAL HIGH (ref 80.0–100.0)

## 2015-10-17 LAB — BASIC METABOLIC PANEL
Anion gap: 11 (ref 5–15)
BUN: 30 mg/dL — ABNORMAL HIGH (ref 6–20)
CHLORIDE: 91 mmol/L — AB (ref 101–111)
CO2: 40 mmol/L — ABNORMAL HIGH (ref 22–32)
CREATININE: 1.11 mg/dL — AB (ref 0.44–1.00)
Calcium: 10.5 mg/dL — ABNORMAL HIGH (ref 8.9–10.3)
GFR calc non Af Amer: 49 mL/min — ABNORMAL LOW (ref >60)
GFR, EST AFRICAN AMERICAN: 57 mL/min — AB (ref >60)
Glucose, Bld: 133 mg/dL — ABNORMAL HIGH (ref 65–99)
Potassium: 2.8 mmol/L — ABNORMAL LOW (ref 3.5–5.1)
SODIUM: 142 mmol/L (ref 135–145)

## 2015-10-17 LAB — TRIGLYCERIDES: Triglycerides: 303 mg/dL — ABNORMAL HIGH (ref <150)

## 2015-10-17 LAB — CBC
HCT: 29.2 % — ABNORMAL LOW (ref 36.0–46.0)
Hemoglobin: 8.5 g/dL — ABNORMAL LOW (ref 12.0–15.0)
MCH: 30.4 pg (ref 26.0–34.0)
MCHC: 29.1 g/dL — ABNORMAL LOW (ref 30.0–36.0)
MCV: 104.3 fL — AB (ref 78.0–100.0)
PLATELETS: 241 10*3/uL (ref 150–400)
RBC: 2.8 MIL/uL — AB (ref 3.87–5.11)
RDW: 13.1 % (ref 11.5–15.5)
WBC: 7.5 10*3/uL (ref 4.0–10.5)

## 2015-10-17 LAB — LEGIONELLA PNEUMOPHILA SEROGP 1 UR AG: L. PNEUMOPHILA SEROGP 1 UR AG: NEGATIVE

## 2015-10-17 LAB — MAGNESIUM: MAGNESIUM: 1 mg/dL — AB (ref 1.7–2.4)

## 2015-10-17 LAB — HIV ANTIBODY (ROUTINE TESTING W REFLEX): HIV SCREEN 4TH GENERATION: NONREACTIVE

## 2015-10-17 LAB — PROCALCITONIN: Procalcitonin: 0.21 ng/mL

## 2015-10-17 LAB — FOLATE RBC
FOLATE, HEMOLYSATE: 428.6 ng/mL
FOLATE, RBC: 1424 ng/mL (ref >498)
HEMATOCRIT: 30.1 % — AB (ref 34.0–46.6)

## 2015-10-17 MED ORDER — POTASSIUM CHLORIDE 10 MEQ/100ML IV SOLN
10.0000 meq | INTRAVENOUS | Status: AC
Start: 2015-10-17 — End: 2015-10-17
  Administered 2015-10-17 (×6): 10 meq via INTRAVENOUS
  Filled 2015-10-17 (×2): qty 100

## 2015-10-17 MED ORDER — ACETAMINOPHEN 10 MG/ML IV SOLN
INTRAVENOUS | Status: AC
  Filled 2015-10-17: qty 200

## 2015-10-17 MED ORDER — ACETAZOLAMIDE SODIUM 500 MG IJ SOLR
500.0000 mg | Freq: Once | INTRAMUSCULAR | Status: AC
  Administered 2015-10-17: 500 mg via INTRAVENOUS
  Filled 2015-10-17: qty 500

## 2015-10-17 NOTE — Progress Notes (Signed)
PROGRESS NOTE    [name]  LEX:[id number] DOB: 1945-05-15 DOA: 10/15/2015 PCP: Chevis Pretty, FNP     Brief Narrative:  70 year old woman admitted on 7/30 from home with complaints of shortness of breath and cough. She was found to have community-acquired pneumonia. She does have a history of hypoxic/hypercarbic chronic respiratory failure due to COPD. was intubated on 7/31 due to unresponsiveness and a PCO2 of 92.  Assessment & Plan:   Principal Problem:   CAP (community acquired pneumonia) Active Problems:   HTN (hypertension)   RA (rheumatoid arthritis) (HCC)   Acute and chronic respiratory failure with hypercapnia (HCC)   COPD with acute exacerbation (HCC)   Anxiety   Chronic diastolic CHF (congestive heart failure) (HCC)   Macrocytic anemia   Acute respiratory failure with hypoxia and hypercapnia (HCC)   Acute on chronic ventilatory dependent respiratory failure with hypoxia and hypercapnia -Patient remains intubated today, Dr. Luan Pulling assisting with ventilator management. -ABG today shows significant metabolic alkalosis likely due to [place] reducing her PCO2 with her elevated bicarbonate. We'll give her 1 dose of Diamox today to help drive bicarbonate down and improved pH.  Community-acquired pneumonia -Continue Rocephin/azithromycin. -Culture data is currently pending.  COPD with acute exacerbation -Continue steroids, nebs.   Chronic diastolic CHF -This appears compensated at present.  Hypokalemia -Replace IV, check magnesium level.   DVT prophylaxis: Lovenox Code Status: Full Code Family Communication: Discussed with son Sherren Mocha at bedside and daughter Helene Kelp via phone on 7/31. Disposition Plan: Keep in ICU, patient in critical condition. If no improvement over next 48 hours, consider palliative care consultation for goals of care.  Consultants:   Pulmonary  Procedures:   Intubation 7/31  Antimicrobials:   Rocephin/Azithromycin     Subjective: Completely sedated on vent.  Objective: Vitals:   10/17/15 0515 10/17/15 0530 10/17/15 0822 10/17/15 0838  BP: 122/72 124/69    Pulse: 76 71  70  Resp: '13 13  18  '$ Temp:   (!) 100.9 F (38.3 C)   TempSrc:   Axillary   SpO2: 100% 100% 100% 100%  Weight:      Height:        Intake/Output Summary (Last 24 hours) at 10/17/15 1021 Last data filed at 10/17/15 0900  Gross per 24 hour  Intake           827.61 ml  Output              450 ml  Net           377.61 ml   Filed Weights   10/15/15 1551 10/16/15 0508 10/17/15 0500  Weight: 45.4 kg (100 lb) 41.5 kg (91 lb 7.9 oz) 41.9 kg (92 lb 6 oz)    Examination:  General exam: Sedated Respiratory system: No wheezes, fair air movement Cardiovascular system:tachycardic, regular. No murmurs, rubs, gallops. Gastrointestinal system: Abdomen is nondistended, soft and nontender. No organomegaly or masses felt. Normal bowel sounds heard. Central nervous system: Unable to assess given current mental state Extremities: No C/C/E, +pedal pulses Skin: No rashes, lesions or ulcers Psychiatry: Unable to assess given current mental state    Data Reviewed: I have personally reviewed following labs and imaging studies  CBC:  Recent Labs Lab 10/15/15 1629 10/16/15 0433 10/17/15 0458  WBC 6.6 5.0 7.5  NEUTROABS 4.8 4.2  --   HGB 9.7* 9.1* 8.5*  HCT 34.6* 31.0* 29.2*  MCV 105.8* 104.0* 104.3*  PLT 216 215 449   Basic Metabolic Panel:  Recent  Labs Lab 10/15/15 1629 10/16/15 0433 10/17/15 0458  NA 142 143 142  K 3.6 3.7 2.8*  CL 83* 86* 91*  CO2 54* 50* 40*  GLUCOSE 109* 153* 133*  BUN 17 17 30*  CREATININE 0.71 0.69 1.11*  CALCIUM 11.0* 10.4* 10.5*   GFR: Estimated Creatinine Clearance: 30.7 mL/min (by C-G formula based on SCr of 1.11 mg/dL). Liver Function Tests: No results for input(s): AST, ALT, ALKPHOS, BILITOT, PROT, ALBUMIN in the last 168 hours. No results for input(s): LIPASE, AMYLASE in the last  168 hours. No results for input(s): AMMONIA in the last 168 hours. Coagulation Profile: No results for input(s): INR, PROTIME in the last 168 hours. Cardiac Enzymes:  Recent Labs Lab 10/15/15 1629  TROPONINI <0.03   BNP (last 3 results) No results for input(s): PROBNP in the last 8760 hours. HbA1C: No results for input(s): HGBA1C in the last 72 hours. CBG: No results for input(s): GLUCAP in the last 168 hours. Lipid Profile:  Recent Labs  10/17/15 0458  TRIG 303*   Thyroid Function Tests: No results for input(s): TSH, T4TOTAL, FREET4, T3FREE, THYROIDAB in the last 72 hours. Anemia Panel:  Recent Labs  10/16/15 0434  VITAMINB12 295  TIBC 192*  IRON 21*   Urine analysis:    Component Value Date/Time   COLORURINE YELLOW 07/29/2013 1915   APPEARANCEUR CLEAR 07/29/2013 1915   LABSPEC 1.020 07/29/2013 1915   PHURINE 6.0 07/29/2013 1915   GLUCOSEU NEGATIVE 07/29/2013 1915   HGBUR SMALL (A) 07/29/2013 1915   BILIRUBINUR NEGATIVE 07/29/2013 [place] 07/29/2013 1915   PROTEINUR TRACE (A) 07/29/2013 1915   UROBILINOGEN 0.2 07/29/2013 1915   NITRITE NEGATIVE 07/29/2013 1915   LEUKOCYTESUR NEGATIVE 07/29/2013 1915   Sepsis Labs: '@LABRCNTIP'$ (procalcitonin:4,lacticidven:4)  ) Recent Results (from the past 240 hour(s))  MRSA PCR Screening     Status: None   Collection Time: 10/15/15  9:54 PM  Result Value Ref Range Status   MRSA by PCR NEGATIVE NEGATIVE Final    Comment:        The GeneXpert MRSA Assay (FDA approved for NASAL specimens only), is one component of a comprehensive MRSA colonization surveillance program. It is not intended to diagnose MRSA infection nor to guide or monitor treatment for MRSA infections.   Culture, expectorated sputum-assessment     Status: None   Collection Time: 10/16/15  1:43 PM  Result Value Ref Range Status   Specimen Description TRACHEAL ASPIRATE  Final   Special Requests NONE  Final   Sputum evaluation    Final    THIS SPECIMEN IS ACCEPTABLE. RESPIRATORY CULTURE REPORT TO FOLLOW. Performed at [facility]    Report Status 10/16/2015 FINAL  Final  Culture, respiratory (NON-Expectorated)     Status: None (Preliminary result)   Collection Time: 10/16/15  1:43 PM  Result Value Ref Range Status   Specimen Description TRACHEAL ASPIRATE  Final   Special Requests NONE  Final   Gram Stain   Final    ABUNDANT WBC PRESENT,BOTH PMN AND MONONUCLEAR NO ORGANISMS SEEN Performed at [facility]    Culture PENDING  Incomplete   Report Status PENDING  Incomplete         Radiology Studies: Dg Chest Port 1 View  Result Date: 10/17/2015 CLINICAL DATA:  Worsening shortness of breath and productive cough for 2 days. Acute respiratory failure. EXAM: PORTABLE CHEST 1 VIEW COMPARISON:  10/16/2015 FINDINGS: Endotracheal tube tip is not well seen. Enteric tube courses into  the upper abdomen with tip not imaged. The cardiac silhouette is normal in size. Scarring and architectural distortion with volume loss are again seen in the upper lobes. More confluent opacity in the right upper lobe is unchanged, as is patchy right basilar opacity. There may be small pleural effusions. No pneumothorax is identified. 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 Electronically Signed   By: Logan Bores M.D.   On: 10/17/2015 07:21   Dg Chest Port 1 View  Result Date: 10/16/2015 CLINICAL DATA:  Hypoxia EXAM: PORTABLE CHEST 1 VIEW COMPARISON:  October 15, 2015 chest radiograph and chest CT March 31, 2014 FINDINGS: Endotracheal tube tip is 1.8 cm above the carina. Nasogastric tube tip and side port are below the diaphragm. No pneumothorax. There is scarring with volume loss in both upper lobes, slightly more severe on the right than on the left. There is also consolidation with effusion in the right base. No new opacity is evident. 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 There is atherosclerotic calcification in the aorta. No adenopathy evident. IMPRESSION: Persistent volume loss and fibrosis in the upper lobes. Superimposed pneumonia in the upper lobes function on right, is of concern. There is patchy infiltrate in the right base with small right effusion. The overall appearance of the lungs is stable compared to 1 day prior. Tube positions are as described without pneumothorax. Cardiac silhouette is within normal limits and stable. Electronically Signed   By: Lowella Grip III M.D.   On: 10/16/2015 11:51  Dg Chest Port 1 View  Result Date: 10/15/2015 CLINICAL DATA:  Shortness of Breath EXAM: PORTABLE CHEST 1 VIEW COMPARISON:  03/31/2014 FINDINGS: Cardiac shadow is stable. Biapical scarring is again seen. Increased density is noted in the right apex. This is new from the prior exam and may represent acute on chronic infiltrate. Short-term follow-up is recommended. Chronic changes are seen in the right lung base. No acute bony abnormality is noted. IMPRESSION: Increased density in the right upper lobe likely representing acute on chronic infiltrate. Followup PA and lateral chest X-ray is recommended in 3-4 weeks following trial of antibiotic therapy to ensure resolution and exclude underlying malignancy. Electronically Signed   By: Inez Catalina M.D.   On: 10/15/2015 16:52       Scheduled Meds: . acetaminophen  1,000 mg Intravenous Q6H  . acetaZOLAMIDE  500 mg Intravenous Once  . amitriptyline  50 mg Oral QHS  . antiseptic oral rinse  7 mL Mouth Rinse QID  . aspirin  325 mg Oral Daily  . azithromycin  500 mg Intravenous Q24H  . buPROPion  150 mg Oral Daily  . cefTRIAXone (ROCEPHIN)  IV  1 g Intravenous Q24H  . chlorhexidine gluconate (SAGE KIT)  15 mL Mouth Rinse BID  . cholecalciferol  1,000 Units Oral Daily  . enoxaparin  (LOVENOX) injection  40 mg Subcutaneous Q24H  . famotidine (PEPCID) IV  20 mg Intravenous Q12H  . fluticasone furoate-vilanterol  1 puff Inhalation Daily  . ipratropium  0.5 mg Nebulization Q6H  . levalbuterol  0.63 mg Nebulization Q6H  . loratadine  10 mg Oral Daily  . methylPREDNISolone (SOLU-MEDROL) injection  60 mg Intravenous Q6H  . metoprolol  50 mg Oral BID   Continuous Infusions: . fentaNYL infusion INTRAVENOUS 30 mcg/hr (10/17/15 0900)  . propofol (DIPRIVAN) infusion 20 mcg/kg/min (10/17/15 0900)  LOS: 2 days    Critical care time spent: 45 minutes. Greater than 50% of this time was spent in direct contact with the patient coordinating care.     Lelon Frohlich, MD [facility] Pager [phone]  If 7PM-7AM, please contact night-coverage [email or web address] Password [email or web address] 10/17/2015, 10:21 AM

## 2015-10-17 NOTE — Progress Notes (Addendum)
Subjective: She remains on the ventilator with sedation. She is significantly over ventilated on current settings.  Objective: Vital signs in last 24 hours: Temp:  [98.5 F (36.9 C)-101.7 F (38.7 C)] 101.5 F (38.6 C) (08/01 0400) Pulse Rate:  [39-122] 71 (08/01 0530) Resp:  [0-32] 13 (08/01 0530) BP: (97-162)/(52-113) 124/69 (08/01 0530) SpO2:  [97 %-100 %] 100 % (08/01 0530) FiO2 (%):  [35 %-50 %] 35 % (08/01 0311) Weight:  [41.9 kg (92 lb 6 oz)] 41.9 kg (92 lb 6 oz) (08/01 0500) Weight change: -3.46 kg (-7 lb 10 oz)    Intake/Output from previous day: 07/31 0701 - 08/01 0700 In: 984.2 [I.V.:384.2; IV Piggyback:600] Out: 450 [Urine:450]  PHYSICAL EXAM General appearance: Intubated sedated on mechanical ventilation Resp: clear to auscultation bilaterally Cardio: regular rate and rhythm, S1, S2 normal, no murmur, click, rub or gallop GI: soft, non-tender; bowel sounds normal; no masses,  no organomegaly Extremities: extremities normal, atraumatic, no cyanosis or edema  Lab Results:  Results for orders placed or performed during the hospital encounter of 10/15/15 (from the past 48 hour(s))  Basic metabolic panel     Status: Abnormal   Collection Time: 10/15/15  4:29 PM  Result Value Ref Range   Sodium 142 135 - 145 mmol/L   Potassium 3.6 3.5 - 5.1 mmol/L   Chloride 83 (L) 101 - 111 mmol/L   CO2 54 (H) 22 - 32 mmol/L   Glucose, Bld 109 (H) 65 - 99 mg/dL   BUN 17 6 - 20 mg/dL   Creatinine, Ser 0.71 0.44 - 1.00 mg/dL   Calcium 11.0 (H) 8.9 - 10.3 mg/dL   GFR calc non Af Amer >60 >60 mL/min   GFR calc Af Amer >60 >60 mL/min    Comment: (NOTE) The eGFR has been calculated using the CKD EPI equation. This calculation has not been validated in all clinical situations. eGFR's persistently <60 mL/min signify possible Chronic Kidney Disease.    Anion gap 5 5 - 15  Troponin I     Status: None   Collection Time: 10/15/15  4:29 PM  Result Value Ref Range   Troponin I <0.03  <0.03 ng/mL  Brain natriuretic peptide     Status: Abnormal   Collection Time: 10/15/15  4:29 PM  Result Value Ref Range   B Natriuretic Peptide 181.0 (H) 0.0 - 100.0 pg/mL  CBC with Differential     Status: Abnormal   Collection Time: 10/15/15  4:29 PM  Result Value Ref Range   WBC 6.6 4.0 - 10.5 K/uL   RBC 3.27 (L) 3.87 - 5.11 MIL/uL   Hemoglobin 9.7 (L) 12.0 - 15.0 g/dL   HCT 34.6 (L) 36.0 - 46.0 %   MCV 105.8 (H) 78.0 - 100.0 fL   MCH 29.7 26.0 - 34.0 pg   MCHC 28.0 (L) 30.0 - 36.0 g/dL   RDW 12.9 11.5 - 15.5 %   Platelets 216 150 - 400 K/uL   Neutrophils Relative % 73 %   Neutro Abs 4.8 1.7 - 7.7 K/uL   Lymphocytes Relative 11 %   Lymphs Abs 0.7 0.7 - 4.0 K/uL   Monocytes Relative 17 %   Monocytes Absolute 1.1 (H) 0.1 - 1.0 K/uL   Eosinophils Relative 0 %   Eosinophils Absolute 0.0 0.0 - 0.7 K/uL   Basophils Relative 0 %   Basophils Absolute 0.0 0.0 - 0.1 K/uL   WBC Morphology ATYPICAL LYMPHOCYTES   Lactic acid, plasma  Status: None   Collection Time: 10/15/15  4:29 PM  Result Value Ref Range   Lactic Acid, Venous 0.9 0.5 - 1.9 mmol/L  Ethanol     Status: None   Collection Time: 10/15/15  4:29 PM  Result Value Ref Range   Alcohol, Ethyl (B) <5 <5 mg/dL    Comment:        LOWEST DETECTABLE LIMIT FOR SERUM ALCOHOL IS 5 mg/dL FOR MEDICAL PURPOSES ONLY   Lactic acid, plasma     Status: None   Collection Time: 10/15/15  7:31 PM  Result Value Ref Range   Lactic Acid, Venous 1.1 0.5 - 1.9 mmol/L  MRSA PCR Screening     Status: None   Collection Time: 10/15/15  9:54 PM  Result Value Ref Range   MRSA by PCR NEGATIVE NEGATIVE    Comment:        The GeneXpert MRSA Assay (FDA approved for NASAL specimens only), is one component of a comprehensive MRSA colonization surveillance program. It is not intended to diagnose MRSA infection nor to guide or monitor treatment for MRSA infections.   Blood gas, arterial     Status: Abnormal   Collection Time: 10/15/15 10:10  PM  Result Value Ref Range   FIO2 35.00    Delivery systems BILEVEL POSITIVE AIRWAY PRESSURE    Inspiratory PAP 14.0    Expiratory PAP 6.0    pH, Arterial 7.383 7.350 - 7.450   pCO2 arterial 92.1 (HH) 35.0 - 45.0 mmHg    Comment: CRITICAL RESULT CALLED TO, READ BACK BY AND VERIFIED WITH: DANIELS, J. RN AT 2223 10/15/15 BY PITTMAN, S RRT/RCP    pO2, Arterial 65.4 (L) 80.0 - 100.0 mmHg   Bicarbonate 49.8 (H) 20.0 - 24.0 mEq/L   TCO2 11.9 0 - 100 mmol/L   Acid-Base Excess 26.6 (H) 0.0 - 2.0 mmol/L   O2 Saturation 92.1 %   Patient temperature 37.0    Collection site RIGHT RADIAL    Drawn by [id number]    Sample type ARTERIAL    Allens test (pass/fail) PASS PASS  HIV antibody     Status: None   Collection Time: 10/16/15  4:33 AM  Result Value Ref Range   HIV Screen 4th Generation wRfx Non Reactive Non Reactive    Comment: (NOTE) Performed At: [facility] [place], [place] [id number] Lindon Romp MD TD:[id number]   Basic metabolic panel     Status: Abnormal   Collection Time: 10/16/15  4:33 AM  Result Value Ref Range   Sodium 143 135 - 145 mmol/L   Potassium 3.7 3.5 - 5.1 mmol/L   Chloride 86 (L) 101 - 111 mmol/L   CO2 50 (H) 22 - 32 mmol/L   Glucose, Bld 153 (H) 65 - 99 mg/dL   BUN 17 6 - 20 mg/dL   Creatinine, Ser 0.69 0.44 - 1.00 mg/dL   Calcium 10.4 (H) 8.9 - 10.3 mg/dL   GFR calc non Af Amer >60 >60 mL/min   GFR calc Af Amer >60 >60 mL/min    Comment: (NOTE) The eGFR has been calculated using the CKD EPI equation. This calculation has not been validated in all clinical situations. eGFR's persistently <60 mL/min signify possible Chronic Kidney Disease.    Anion gap 7 5 - 15  CBC WITH DIFFERENTIAL     Status: Abnormal   Collection Time: 10/16/15  4:33 AM  Result Value Ref Range   WBC 5.0 4.0 - 10.5 K/uL  RBC 2.98 (L) 3.87 - 5.11 MIL/uL   Hemoglobin 9.1 (L) 12.0 - 15.0 g/dL   HCT 31.0 (L) 36.0 - 46.0 %   MCV 104.0 (H) 78.0 - 100.0 fL   MCH  30.5 26.0 - 34.0 pg   MCHC 29.4 (L) 30.0 - 36.0 g/dL   RDW 12.9 11.5 - 15.5 %   Platelets 215 150 - 400 K/uL   Neutrophils Relative % 85 %   Lymphocytes Relative 11 %   Monocytes Relative 4 %   Eosinophils Relative 0 %   Basophils Relative 0 %   Neutro Abs 4.2 1.7 - 7.7 K/uL   Lymphs Abs 0.6 (L) 0.7 - 4.0 K/uL   Monocytes Absolute 0.2 0.1 - 1.0 K/uL   Eosinophils Absolute 0.0 0.0 - 0.7 K/uL   Basophils Absolute 0.0 0.0 - 0.1 K/uL   WBC Morphology ATYPICAL LYMPHOCYTES   Procalcitonin - Baseline     Status: None   Collection Time: 10/16/15  4:33 AM  Result Value Ref Range   Procalcitonin 0.22 ng/mL    Comment:        Interpretation: PCT (Procalcitonin) <= 0.5 ng/mL: Systemic infection (sepsis) is not likely. Local bacterial infection is possible. (NOTE)         ICU PCT Algorithm               Non ICU PCT Algorithm    ----------------------------     ------------------------------         PCT < 0.25 ng/mL                 PCT < 0.1 ng/mL     Stopping of antibiotics            Stopping of antibiotics       strongly encouraged.               strongly encouraged.    ----------------------------     ------------------------------       PCT level decrease by               PCT < 0.25 ng/mL       >= 80% from peak PCT       OR PCT 0.25 - 0.5 ng/mL          Stopping of antibiotics                                             encouraged.     Stopping of antibiotics           encouraged.    ----------------------------     ------------------------------       PCT level decrease by              PCT >= 0.25 ng/mL       < 80% from peak PCT        AND PCT >= 0.5 ng/mL            Continuin g antibiotics                                              encouraged.       Continuing antibiotics            encouraged.    ----------------------------     ------------------------------  PCT level increase compared          PCT > 0.5 ng/mL         with peak PCT AND          PCT >= 0.5 ng/mL              Escalation of antibiotics                                          strongly encouraged.      Escalation of antibiotics        strongly encouraged.   Vitamin B12     Status: None   Collection Time: 10/16/15  4:34 AM  Result Value Ref Range   Vitamin B-12 295 180 - 914 pg/mL    Comment: (NOTE) This assay is not validated for testing neonatal or myeloproliferative syndrome specimens for Vitamin B12 levels. Performed at [facility]   Iron and TIBC     Status: Abnormal   Collection Time: 10/16/15  4:34 AM  Result Value Ref Range   Iron 21 (L) 28 - 170 ug/dL   TIBC 192 (L) 250 - 450 ug/dL   Saturation Ratios 11 10.4 - 31.8 %   UIBC 171 ug/dL    Comment: Performed at [facility]  Draw ABG 1 hour after initiation of ventilator     Status: Abnormal   Collection Time: 10/16/15 12:48 PM  Result Value Ref Range   FIO2 0.50    O2 Content 50.0 L/min   Delivery systems VENTILATOR    Mode PRESSURE REGULATED VOLUME CONTROL    VT 500 mL   LHR 14 resp/min   Peep/cpap 5.0 cm H20   pH, Arterial 7.475 (H) 7.350 - 7.450   pCO2 arterial 67.1 (HH) 35.0 - 45.0 mmHg    Comment: CRITICAL RESULT CALLED TO, READ BACK BY AND VERIFIED WITH: Jaxen Samples,MD [id number] BY PEVIANY LAWSON,RRT ON 10/16/2015.    pO2, Arterial 214 (H) 80.0 - 100.0 mmHg   Bicarbonate 46.5 (H) 20.0 - 24.0 mEq/L   Acid-Base Excess 23.1 (H) 0.0 - 2.0 mmol/L   O2 Saturation 99.8 %   Collection site RIGHT RADIAL    Drawn by [phone]    Sample type ARTERIAL    Allens test (pass/fail) PASS PASS  Culture, expectorated sputum-assessment     Status: None   Collection Time: 10/16/15  1:43 PM  Result Value Ref Range   Specimen Description TRACHEAL ASPIRATE    Special Requests NONE    Sputum evaluation      THIS SPECIMEN IS ACCEPTABLE. RESPIRATORY CULTURE REPORT TO FOLLOW. Performed at [facility]    Report Status 10/16/2015 FINAL   Culture, respiratory (NON-Expectorated)     Status: None (Preliminary  result)   Collection Time: 10/16/15  1:43 PM  Result Value Ref Range   Specimen Description TRACHEAL ASPIRATE    Special Requests NONE    Gram Stain      ABUNDANT WBC PRESENT,BOTH PMN AND MONONUCLEAR NO ORGANISMS SEEN Performed at [facility]    Culture PENDING    Report Status PENDING   Strep pneumoniae urinary antigen     Status: None   Collection Time: 10/16/15  3:30 PM  Result Value Ref Range   Strep Pneumo Urinary Antigen NEGATIVE NEGATIVE    Comment: Performed at [facility]  Procalcitonin  Status: None   Collection Time: 10/17/15  4:58 AM  Result Value Ref Range   Procalcitonin 0.21 ng/mL  Basic metabolic panel     Status: Abnormal   Collection Time: 10/17/15  4:58 AM  Result Value Ref Range   Sodium 142 135 - 145 mmol/L   Potassium 2.8 (L) 3.5 - 5.1 mmol/L    Comment: DELTA CHECK NOTED   Chloride 91 (L) 101 - 111 mmol/L   CO2 40 (H) 22 - 32 mmol/L   Glucose, Bld 133 (H) 65 - 99 mg/dL   BUN 30 (H) 6 - 20 mg/dL   Creatinine, Ser 1.11 (H) 0.44 - 1.00 mg/dL   Calcium 10.5 (H) 8.9 - 10.3 mg/dL   GFR calc non Af Amer 49 (L) >60 mL/min   GFR calc Af Amer 57 (L) >60 mL/min    Comment: (NOTE) The eGFR has been calculated using the CKD EPI equation. This calculation has not been validated in all clinical situations. eGFR's persistently <60 mL/min signify possible Chronic Kidney Disease.    Anion gap 11 5 - 15  CBC     Status: Abnormal   Collection Time: 10/17/15  4:58 AM  Result Value Ref Range   WBC 7.5 4.0 - 10.5 K/uL   RBC 2.80 (L) 3.87 - 5.11 MIL/uL   Hemoglobin 8.5 (L) 12.0 - 15.0 g/dL   HCT 29.2 (L) 36.0 - 46.0 %   MCV 104.3 (H) 78.0 - 100.0 fL   MCH 30.4 26.0 - 34.0 pg   MCHC 29.1 (L) 30.0 - 36.0 g/dL   RDW 13.1 11.5 - 15.5 %   Platelets 241 150 - 400 K/uL  Triglycerides     Status: Abnormal   Collection Time: 10/17/15  4:58 AM  Result Value Ref Range   Triglycerides 303 (H) <150 mg/dL  Blood gas, arterial     Status: Abnormal    Collection Time: 10/17/15  6:00 AM  Result Value Ref Range   FIO2 0.35    pH, Arterial 7.658 (HH) 7.350 - 7.450    Comment: CRITICAL RESULT CALLED TO, READ BACK BY AND VERIFIED WITH: R.WAGONER,RN BY B.SMITH,RRT,RCP ON 10/17/15 AT 06:07    pCO2 arterial 38.8 35.0 - 45.0 mmHg   pO2, Arterial 142 (H) 80.0 - 100.0 mmHg   Bicarbonate 44.5 (H) 20.0 - 24.0 mEq/L   Acid-Base Excess 20.3 (H) 0.0 - 2.0 mmol/L   O2 Saturation 99.4 %   Patient temperature 37.0    Allens test (pass/fail) POSITIVE (A) PASS    ABGS  Recent Labs  10/15/15 2210  10/17/15 0600  PHART 7.383  < > 7.658*  PO2ART 65.4*  < > 142*  TCO2 11.9  --   --   HCO3 49.8*  < > 44.5*  < > = values in this interval not displayed. CULTURES Recent Results (from the past 240 hour(s))  MRSA PCR Screening     Status: None   Collection Time: 10/15/15  9:54 PM  Result Value Ref Range Status   MRSA by PCR NEGATIVE NEGATIVE Final    Comment:        The GeneXpert MRSA Assay (FDA approved for NASAL specimens only), is one component of a comprehensive MRSA colonization surveillance program. It is not intended to diagnose MRSA infection nor to guide or monitor treatment for MRSA infections.   Culture, expectorated sputum-assessment     Status: None   Collection Time: 10/16/15  1:43 PM  Result Value Ref Range Status   Specimen  Description TRACHEAL ASPIRATE  Final   Special Requests NONE  Final   Sputum evaluation   Final    THIS SPECIMEN IS ACCEPTABLE. RESPIRATORY CULTURE REPORT TO FOLLOW. Performed at [facility]    Report Status 10/16/2015 FINAL  Final  Culture, respiratory (NON-Expectorated)     Status: None (Preliminary result)   Collection Time: 10/16/15  1:43 PM  Result Value Ref Range Status   Specimen Description TRACHEAL ASPIRATE  Final   Special Requests NONE  Final   Gram Stain   Final    ABUNDANT WBC PRESENT,BOTH PMN AND MONONUCLEAR NO ORGANISMS SEEN Performed at [facility]    Culture  PENDING  Incomplete   Report Status PENDING  Incomplete   Studies/Results: Dg Chest Port 1 View  Result Date: 10/17/2015 CLINICAL DATA:  Worsening shortness of breath and productive cough for 2 days. Acute respiratory failure. EXAM: PORTABLE CHEST 1 VIEW COMPARISON:  10/16/2015 FINDINGS: Endotracheal tube tip is not well seen. Enteric tube courses into the upper abdomen with tip not imaged. The cardiac silhouette is normal in size. Scarring and architectural distortion with volume loss are again seen in the upper lobes. More confluent opacity in the right upper lobe is unchanged, as is patchy right basilar opacity. There may be small pleural effusions. No pneumothorax is identified. 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 Electronically Signed   By: Logan Bores M.D.   On: 10/17/2015 07:21   Dg Chest Port 1 View  Result Date: 10/16/2015 CLINICAL DATA:  Hypoxia EXAM: PORTABLE CHEST 1 VIEW COMPARISON:  October 15, 2015 chest radiograph and chest CT March 31, 2014 FINDINGS: Endotracheal tube tip is 1.8 cm above the carina. Nasogastric tube tip and side port are below the diaphragm. No pneumothorax. There is scarring with volume loss in both upper lobes, slightly more severe on the right than on the left. There is also consolidation with effusion in the right base. No new opacity is evident. 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 There is atherosclerotic calcification in the aorta. No adenopathy evident. IMPRESSION: Persistent volume loss and fibrosis in the upper lobes. Superimposed pneumonia in the upper lobes function on right, is of concern. There is patchy infiltrate in the right base with small right effusion. The overall appearance of the lungs is stable compared to 1 day prior. Tube positions are as described without  pneumothorax. Cardiac silhouette is within normal limits and stable. Electronically Signed   By: Lowella Grip III M.D.   On: 10/16/2015 11:51  Dg Chest Port 1 View  Result Date: 10/15/2015 CLINICAL DATA:  Shortness of Breath EXAM: PORTABLE CHEST 1 VIEW COMPARISON:  03/31/2014 FINDINGS: Cardiac shadow is stable. Biapical scarring is again seen. Increased density is noted in the right apex. This is new from the prior exam and may represent acute on chronic infiltrate. Short-term follow-up is recommended. Chronic changes are seen in the right lung base. No acute bony abnormality is noted. IMPRESSION: Increased density in the right upper lobe likely representing acute on chronic infiltrate. Followup PA and lateral chest X-ray is recommended in 3-4 weeks following trial of antibiotic therapy to ensure resolution and exclude underlying malignancy. Electronically Signed   By: Inez Catalina M.D.   On: 10/15/2015 16:52   Medications:  Prior to Admission:  Prescriptions Prior to Admission  Medication Sig Dispense Refill  Last Dose  . adalimumab (HUMIRA PEN) 40 MG/0.8ML injection Inject 40 mg into the skin every 14 (fourteen) days.    Taking  . albuterol (PROAIR HFA) 108 (90 BASE) MCG/ACT inhaler INHALE 2 PUFFS INTO THE LUNGS EVERY 6 HOURS AS NEEDED FOR WHEEZING OR SHORTNESS OF BREATH 1 Inhaler 2   . albuterol (PROVENTIL) (2.5 MG/3ML) 0.083% nebulizer solution USE ONE VIAL IN NEBULIZER EVERY 6 HOURS AS NEEDED FOR WHEEZING AND FOR SHORTNESS OF BREATH 1080 mL 2 Taking  . ALPRAZolam (XANAX) 0.5 MG tablet Take 1 tablet (0.5 mg total) by mouth 2 (two) times daily. 180 tablet 0   . amitriptyline (ELAVIL) 50 MG tablet Take 1 tablet by mouth at  bedtime 90 tablet 1   . aspirin 325 MG tablet Take 325 mg by mouth daily.     Taking  . buPROPion (WELLBUTRIN XL) 150 MG 24 hr tablet Take 1 tablet by mouth  daily 90 tablet 0   . cetirizine (ALL DAY ALLERGY) 10 MG tablet Take 10 mg by mouth daily.   Taking  .  cholecalciferol (VITAMIN D) 1000 UNITS tablet Take 1,000 Units by mouth daily.   Taking  . EQL CALCIUM CITRATE/VITAMIN D PO Take by mouth. Vitamin D 500IU with Calcium Citrate '630mg'$    Taking  . Fluticasone Furoate-Vilanterol (BREO ELLIPTA) 100-25 MCG/INH AEPB Inhale into the lungs.   Taking  . furosemide (LASIX) 20 MG tablet Take 1 tablet by mouth  daily 90 tablet 0   . guaifenesin (MUCUS RELIEF) 400 MG TABS tablet Take 400 mg by mouth daily as needed (for ocngestion).   Taking  . HYDROcodone-acetaminophen (NORCO) 10-325 MG tablet Take 1 tablet by mouth every 8 (eight) hours as needed. 60 tablet 0   . metoprolol (LOPRESSOR) 50 MG tablet Take 1 tablet by mouth two  times daily 180 tablet 0   . potassium chloride SA (K-DUR,KLOR-CON) 20 MEQ tablet Take 2 tablets (40 mEq total) by mouth 2 (two) times daily. 4 tablet 0   . tiotropium (SPIRIVA) 18 MCG inhalation capsule Place 1 capsule (18 mcg total) into inhaler and inhale every morning. 90 capsule 0    Scheduled: . acetaminophen  1,000 mg Intravenous Q6H  . amitriptyline  50 mg Oral QHS  . antiseptic oral rinse  7 mL Mouth Rinse QID  . aspirin  325 mg Oral Daily  . azithromycin  500 mg Intravenous Q24H  . buPROPion  150 mg Oral Daily  . cefTRIAXone (ROCEPHIN)  IV  1 g Intravenous Q24H  . chlorhexidine gluconate (SAGE KIT)  15 mL Mouth Rinse BID  . cholecalciferol  1,000 Units Oral Daily  . enoxaparin (LOVENOX) injection  40 mg Subcutaneous Q24H  . famotidine (PEPCID) IV  20 mg Intravenous Q12H  . fluticasone furoate-vilanterol  1 puff Inhalation Daily  . ipratropium  0.5 mg Nebulization Q6H  . levalbuterol  0.63 mg Nebulization Q6H  . loratadine  10 mg Oral Daily  . methylPREDNISolone (SOLU-MEDROL) injection  60 mg Intravenous Q6H  . metoprolol  50 mg Oral BID   Continuous: . fentaNYL infusion INTRAVENOUS 30 mcg/hr (10/17/15 0715)  . propofol (DIPRIVAN) infusion 20 mcg/kg/min (10/17/15 0715)   TDV:VOHYWVPX (SUBLIMAZE) injection,  fentaNYL, guaiFENesin, HYDROcodone-acetaminophen, levalbuterol, LORazepam  Assesment: She was admitted with community-acquired pneumonia and acute on chronic hypoxic and hypercapnic respiratory failure requiring intubation and ventilator support. She had markedly elevated PCO2 and I think our current alkalosis is related to reducing her CO2.  She has other problems including rheumatoid  arthritis and she is immunocompromised from medication. I think this may actually be psoriatic arthritis but I'm not totally sure of the diagnosis.  She has hypertension which is stable  She has chronic diastolic heart failure which also seems to be stable Principal Problem:   CAP (community acquired pneumonia) Active Problems:   HTN (hypertension)   RA (rheumatoid arthritis) (HCC)   Acute and chronic respiratory failure with hypercapnia (HCC)   COPD with acute exacerbation (HCC)   Anxiety   Chronic diastolic CHF (congestive heart failure) (HCC)   Macrocytic anemia   Acute respiratory failure with hypoxia and hypercapnia ([place])    Plan: Continue current treatment. Adjust ventilator. We can see if she is between a ball today although I don't think that's likely. Repeat blood gas after ventilator adjustment    LOS: 2 days   Akhilesh Sassone L 10/17/2015, 7:44 AM

## 2015-10-17 NOTE — Care Management (Signed)
CM consult received. Pt critically ill and unable to be assessed at this time. Will cont to follow.

## 2015-10-17 NOTE — Progress Notes (Signed)
OT Cancellation Note  Patient Details Name: [name] MRN: [id number] DOB: 1945-05-12   Cancelled Treatment:      Reason Eval/Treat Not Completed: Patient not medically ready. Pt has been intubated and is unresponsive, therefore is not appropriate for OT services at this time.  Please re-order when pt is able to participate in therapeutic interventions.    Guadelupe Sabin, OTR/L  [phone] 10/17/2015, 7:59 AM

## 2015-10-18 ENCOUNTER — Inpatient Hospital Stay (HOSPITAL_COMMUNITY): Payer: Medicare Other

## 2015-10-18 DIAGNOSIS — E43 Unspecified severe protein-calorie malnutrition: Secondary | ICD-10-CM | POA: Insufficient documentation

## 2015-10-18 LAB — CBC
HEMATOCRIT: 31.1 % — AB (ref 36.0–46.0)
Hemoglobin: 9.4 g/dL — ABNORMAL LOW (ref 12.0–15.0)
MCH: 29.6 pg (ref 26.0–34.0)
MCHC: 30.2 g/dL (ref 30.0–36.0)
MCV: 97.8 fL (ref 78.0–100.0)
Platelets: 249 10*3/uL (ref 150–400)
RBC: 3.18 MIL/uL — AB (ref 3.87–5.11)
RDW: 13.5 % (ref 11.5–15.5)
WBC: 9.9 10*3/uL (ref 4.0–10.5)

## 2015-10-18 LAB — BLOOD GAS, ARTERIAL
ACID-BASE EXCESS: 11.5 mmol/L — AB (ref 0.0–2.0)
ALLENS TEST (PASS/FAIL): POSITIVE — AB
Bicarbonate: 34.8 mEq/L — ABNORMAL HIGH (ref 20.0–24.0)
DRAWN BY: 382351
FIO2: 0.35
MECHVT: 350 mL
O2 Saturation: 99 %
PEEP: 5 cmH2O
PO2 ART: 144 mmHg — AB (ref 80.0–100.0)
Patient temperature: 37
RATE: 10 resp/min
pCO2 arterial: 45.8 mmHg — ABNORMAL HIGH (ref 35.0–45.0)
pH, Arterial: 7.501 — ABNORMAL HIGH (ref 7.350–7.450)

## 2015-10-18 LAB — BASIC METABOLIC PANEL
ANION GAP: 11 (ref 5–15)
BUN: 37 mg/dL — ABNORMAL HIGH (ref 6–20)
CHLORIDE: 94 mmol/L — AB (ref 101–111)
CO2: 33 mmol/L — ABNORMAL HIGH (ref 22–32)
Calcium: 9.8 mg/dL (ref 8.9–10.3)
Creatinine, Ser: 0.99 mg/dL (ref 0.44–1.00)
GFR, EST NON AFRICAN AMERICAN: 56 mL/min — AB (ref >60)
Glucose, Bld: 160 mg/dL — ABNORMAL HIGH (ref 65–99)
POTASSIUM: 3 mmol/L — AB (ref 3.5–5.1)
SODIUM: 138 mmol/L (ref 135–145)

## 2015-10-18 LAB — TRIGLYCERIDES: Triglycerides: 242 mg/dL — ABNORMAL HIGH (ref <150)

## 2015-10-18 MED ORDER — ACETAZOLAMIDE SODIUM 500 MG IJ SOLR
500.0000 mg | Freq: Two times a day (BID) | INTRAMUSCULAR | Status: AC
  Administered 2015-10-18 – 2015-10-19 (×3): 500 mg via INTRAVENOUS
  Filled 2015-10-18 (×4): qty 500

## 2015-10-18 MED ORDER — POTASSIUM CHLORIDE 10 MEQ/100ML IV SOLN
10.0000 meq | INTRAVENOUS | Status: AC
  Administered 2015-10-18 (×4): 10 meq via INTRAVENOUS
  Filled 2015-10-18: qty 100

## 2015-10-18 NOTE — Progress Notes (Signed)
Subjective: She remains intubated and on mechanical ventilation. She failed weaning yesterday. She has been markedly alkalotic which I think is post hypercapnic alkalosis. We have adjusted her ventilator to allow her PCO2 to rise but she does breathe over the ventilator. I think she probably has a baseline PCO2 of around 60. She received Diamox yesterday and will receive some more today. She probably has some metabolic component as well as her potassium has been somewhat low.  Objective: Vital signs in last 24 hours: Temp:  [97.7 F (36.5 C)-100.9 F (38.3 C)] 98 F (36.7 C) (08/02 0747) Pulse Rate:  [41-120] 119 (08/02 0730) Resp:  [8-20] 18 (08/02 0730) BP: (115-162)/(61-96) 162/96 (08/02 0730) SpO2:  [79 %-100 %] 100 % (08/02 0730) FiO2 (%):  [35 %] 35 % (08/02 0730) Weight:  [42.5 kg (93 lb 11.1 oz)] 42.5 kg (93 lb 11.1 oz) (08/02 0500) Weight change: 0.6 kg (1 lb 5.2 oz)    Intake/Output from previous day: 08/01 0701 - 08/02 0700 In: 157.5 [I.V.:57.5; IV Piggyback:100] Out: 975 [Urine:975]  PHYSICAL EXAM General appearance: alert, cooperative and moderate distress Resp: rhonchi bilaterally Cardio: regular rate and rhythm, S1, S2 normal, no murmur, click, rub or gallop GI: soft, non-tender; bowel sounds normal; no masses,  no organomegaly Extremities: extremities normal, atraumatic, no cyanosis or edema  Lab Results:  Results for orders placed or performed during the hospital encounter of 10/15/15 (from the past 48 hour(s))  Draw ABG 1 hour after initiation of ventilator     Status: Abnormal   Collection Time: 10/16/15 12:48 PM  Result Value Ref Range   FIO2 0.50    O2 Content 50.0 L/min   Delivery systems VENTILATOR    Mode PRESSURE REGULATED VOLUME CONTROL    VT 500 mL   LHR 14 resp/min   Peep/cpap 5.0 cm H20   pH, Arterial 7.475 (H) 7.350 - 7.450   pCO2 arterial 67.1 (HH) 35.0 - 45.0 mmHg    Comment: CRITICAL RESULT CALLED TO, READ BACK BY AND VERIFIED  WITH: Kayna Suppa,MD [id number] BY PEVIANY LAWSON,RRT ON 10/16/2015.    pO2, Arterial 214 (H) 80.0 - 100.0 mmHg   Bicarbonate 46.5 (H) 20.0 - 24.0 mEq/L   Acid-Base Excess 23.1 (H) 0.0 - 2.0 mmol/L   O2 Saturation 99.8 %   Collection site RIGHT RADIAL    Drawn by [phone]    Sample type ARTERIAL    Allens test (pass/fail) PASS PASS  Culture, expectorated sputum-assessment     Status: None   Collection Time: 10/16/15  1:43 PM  Result Value Ref Range   Specimen Description TRACHEAL ASPIRATE    Special Requests NONE    Sputum evaluation      THIS SPECIMEN IS ACCEPTABLE. RESPIRATORY CULTURE REPORT TO FOLLOW. Performed at [facility]    Report Status 10/16/2015 FINAL   Culture, respiratory (NON-Expectorated)     Status: None (Preliminary result)   Collection Time: 10/16/15  1:43 PM  Result Value Ref Range   Specimen Description TRACHEAL ASPIRATE    Special Requests NONE    Gram Stain      ABUNDANT WBC PRESENT,BOTH PMN AND MONONUCLEAR NO ORGANISMS SEEN    Culture      CULTURE REINCUBATED FOR BETTER GROWTH Performed at [facility]    Report Status PENDING   Strep pneumoniae urinary antigen     Status: None   Collection Time: 10/16/15  3:30 PM  Result Value Ref Range   Strep Pneumo Urinary Antigen NEGATIVE  NEGATIVE    Comment: Performed at [facility]  Legionella Pneumophila Serogp 1 Ur Ag     Status: None   Collection Time: 10/16/15  3:30 PM  Result Value Ref Range   L. pneumophila Serogp 1 Ur Ag Negative Negative    Comment: (NOTE) Presumptive negative for L. pneumophila serogroup 1 antigen in urine, suggesting no recent or current infection. Legionnaires' disease cannot be ruled out since other serogroups and species may also cause disease. Performed At: [facility] [place], [place] [id number] Lindon Romp MD DC:[id number]    Source of Sample URINE, RANDOM   Procalcitonin     Status: None   Collection Time: 10/17/15   4:58 AM  Result Value Ref Range   Procalcitonin 0.21 ng/mL  Basic metabolic panel     Status: Abnormal   Collection Time: 10/17/15  4:58 AM  Result Value Ref Range   Sodium 142 135 - 145 mmol/L   Potassium 2.8 (L) 3.5 - 5.1 mmol/L    Comment: DELTA CHECK NOTED   Chloride 91 (L) 101 - 111 mmol/L   CO2 40 (H) 22 - 32 mmol/L   Glucose, Bld 133 (H) 65 - 99 mg/dL   BUN 30 (H) 6 - 20 mg/dL   Creatinine, Ser 1.11 (H) 0.44 - 1.00 mg/dL   Calcium 10.5 (H) 8.9 - 10.3 mg/dL   GFR calc non Af Amer 49 (L) >60 mL/min   GFR calc Af Amer 57 (L) >60 mL/min    Comment: (NOTE) The eGFR has been calculated using the CKD EPI equation. This calculation has not been validated in all clinical situations. eGFR's persistently <60 mL/min signify possible Chronic Kidney Disease.    Anion gap 11 5 - 15  CBC     Status: Abnormal   Collection Time: 10/17/15  4:58 AM  Result Value Ref Range   WBC 7.5 4.0 - 10.5 K/uL   RBC 2.80 (L) 3.87 - 5.11 MIL/uL   Hemoglobin 8.5 (L) 12.0 - 15.0 g/dL   HCT 29.2 (L) 36.0 - 46.0 %   MCV 104.3 (H) 78.0 - 100.0 fL   MCH 30.4 26.0 - 34.0 pg   MCHC 29.1 (L) 30.0 - 36.0 g/dL   RDW 13.1 11.5 - 15.5 %   Platelets 241 150 - 400 K/uL  Triglycerides     Status: Abnormal   Collection Time: 10/17/15  4:58 AM  Result Value Ref Range   Triglycerides 303 (H) <150 mg/dL  Magnesium     Status: Abnormal   Collection Time: 10/17/15  4:58 AM  Result Value Ref Range   Magnesium 1.0 (L) 1.7 - 2.4 mg/dL  Blood gas, arterial     Status: Abnormal   Collection Time: 10/17/15  6:00 AM  Result Value Ref Range   FIO2 0.35    pH, Arterial 7.658 (HH) 7.350 - 7.450    Comment: CRITICAL RESULT CALLED TO, READ BACK BY AND VERIFIED WITH: R.WAGONER,RN BY B.SMITH,RRT,RCP ON 10/17/15 AT 06:07    pCO2 arterial 38.8 35.0 - 45.0 mmHg   pO2, Arterial 142 (H) 80.0 - 100.0 mmHg   Bicarbonate 44.5 (H) 20.0 - 24.0 mEq/L   Acid-Base Excess 20.3 (H) 0.0 - 2.0 mmol/L   O2 Saturation 99.4 %   Patient  temperature 37.0    Allens test (pass/fail) POSITIVE (A) PASS  Blood gas, arterial     Status: Abnormal   Collection Time: 10/17/15  9:45 AM  Result Value Ref Range  FIO2 35.00    Delivery systems VENTILATOR    Mode PRESSURE REGULATED VOLUME CONTROL    VT 400 mL   LHR 10 resp/min   Peep/cpap 5.0 cm H20   pH, Arterial 7.666 (HH) 7.350 - 7.450    Comment: CRITICAL RESULT CALLED TO, READ BACK BY AND VERIFIED WITH: DAVIS,S.RN AT 1004 10/17/15 BY BROADNAX,L.RRT    pCO2 arterial 37.7 35.0 - 45.0 mmHg   pO2, Arterial 146.0 (H) 80.0 - 100.0 mmHg   Bicarbonate 44.3 (H) 20.0 - 24.0 mEq/L   Acid-Base Excess 20.0 (H) 0.0 - 2.0 mmol/L   O2 Saturation 99.6 %   Collection site LEFT BRACHIAL    Drawn by [id number]    Sample type ARTERIAL   Basic metabolic panel     Status: Abnormal   Collection Time: 10/18/15  4:27 AM  Result Value Ref Range   Sodium 138 135 - 145 mmol/L   Potassium 3.0 (L) 3.5 - 5.1 mmol/L   Chloride 94 (L) 101 - 111 mmol/L   CO2 33 (H) 22 - 32 mmol/L   Glucose, Bld 160 (H) 65 - 99 mg/dL   BUN 37 (H) 6 - 20 mg/dL   Creatinine, Ser 0.99 0.44 - 1.00 mg/dL   Calcium 9.8 8.9 - 10.3 mg/dL   GFR calc non Af Amer 56 (L) >60 mL/min   GFR calc Af Amer >60 >60 mL/min    Comment: (NOTE) The eGFR has been calculated using the CKD EPI equation. This calculation has not been validated in all clinical situations. eGFR's persistently <60 mL/min signify possible Chronic Kidney Disease.    Anion gap 11 5 - 15  CBC     Status: Abnormal   Collection Time: 10/18/15  4:27 AM  Result Value Ref Range   WBC 9.9 4.0 - 10.5 K/uL   RBC 3.18 (L) 3.87 - 5.11 MIL/uL   Hemoglobin 9.4 (L) 12.0 - 15.0 g/dL   HCT 31.1 (L) 36.0 - 46.0 %   MCV 97.8 78.0 - 100.0 fL   MCH 29.6 26.0 - 34.0 pg   MCHC 30.2 30.0 - 36.0 g/dL   RDW 13.5 11.5 - 15.5 %   Platelets 249 150 - 400 K/uL  Triglycerides     Status: Abnormal   Collection Time: 10/18/15  4:27 AM  Result Value Ref Range   Triglycerides 242 (H) <150  mg/dL  Blood gas, arterial     Status: Abnormal   Collection Time: 10/18/15  5:54 AM  Result Value Ref Range   FIO2 0.35    Delivery systems VENTILATOR    Mode PRESSURE REGULATED VOLUME CONTROL    VT 350 mL   LHR 10 resp/min   Peep/cpap 5.0 cm H20   pH, Arterial 7.501 (H) 7.350 - 7.450   pCO2 arterial 45.8 (H) 35.0 - 45.0 mmHg   pO2, Arterial 144 (H) 80.0 - 100.0 mmHg   Bicarbonate 34.8 (H) 20.0 - 24.0 mEq/L   Acid-Base Excess 11.5 (H) 0.0 - 2.0 mmol/L   O2 Saturation 99.0 %   Patient temperature 37.0    Collection site LEFT RADIAL    Drawn by [id number]    Sample type ARTERIAL DRAW    Allens test (pass/fail) POSITIVE (A) PASS    ABGS  Recent Labs  10/15/15 2210  10/18/15 0554  PHART 7.383  < > 7.501*  PO2ART 65.4*  < > 144*  TCO2 11.9  --   --   HCO3 49.8*  < > 34.8*  < > =  values in this interval not displayed. CULTURES Recent Results (from the past 240 hour(s))  MRSA PCR Screening     Status: None   Collection Time: 10/15/15  9:54 PM  Result Value Ref Range Status   MRSA by PCR NEGATIVE NEGATIVE Final    Comment:        The GeneXpert MRSA Assay (FDA approved for NASAL specimens only), is one component of a comprehensive MRSA colonization surveillance program. It is not intended to diagnose MRSA infection nor to guide or monitor treatment for MRSA infections.   Culture, expectorated sputum-assessment     Status: None   Collection Time: 10/16/15  1:43 PM  Result Value Ref Range Status   Specimen Description TRACHEAL ASPIRATE  Final   Special Requests NONE  Final   Sputum evaluation   Final    THIS SPECIMEN IS ACCEPTABLE. RESPIRATORY CULTURE REPORT TO FOLLOW. Performed at [facility]    Report Status 10/16/2015 FINAL  Final  Culture, respiratory (NON-Expectorated)     Status: None (Preliminary result)   Collection Time: 10/16/15  1:43 PM  Result Value Ref Range Status   Specimen Description TRACHEAL ASPIRATE  Final   Special Requests NONE  Final    Gram Stain   Final    ABUNDANT WBC PRESENT,BOTH PMN AND MONONUCLEAR NO ORGANISMS SEEN    Culture   Final    CULTURE REINCUBATED FOR BETTER GROWTH Performed at [facility]    Report Status PENDING  Incomplete   Studies/Results: Dg Chest Port 1 View  Result Date: 10/18/2015 CLINICAL DATA:  Respiratory failure EXAM: PORTABLE CHEST 1 VIEW COMPARISON:  10/17/2015 FINDINGS: Endotracheal tube in good position.  NG tube in the stomach Fibro cavitary lesions in the apices bilaterally are stable. No air-fluid levels. Pleural thickening right lung base unchanged. No new area of infiltrate. Negative for heart failure. IMPRESSION: Endotracheal tube in good position.  No change from yesterday. Electronically Signed   By: Franchot Gallo M.D.   On: 10/18/2015 07:04   Dg Chest Port 1 View  Result Date: 10/17/2015 CLINICAL DATA:  Worsening shortness of breath and productive cough for 2 days. Acute respiratory failure. EXAM: PORTABLE CHEST 1 VIEW COMPARISON:  10/16/2015 FINDINGS: Endotracheal tube tip is not well seen. Enteric tube courses into the upper abdomen with tip not imaged. The cardiac silhouette is normal in size. Scarring and architectural distortion with volume loss are again seen in the upper lobes. More confluent opacity in the right upper lobe is unchanged, as is patchy right basilar opacity. There may be small pleural effusions. No pneumothorax is identified. 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 Electronically Signed   By: Logan Bores M.D.   On: 10/17/2015 07:21   Dg Chest Port 1 View  Result Date: 10/16/2015 CLINICAL DATA:  Hypoxia EXAM: PORTABLE CHEST 1 VIEW COMPARISON:  October 15, 2015 chest radiograph and chest CT March 31, 2014 FINDINGS: Endotracheal tube tip is 1.8 cm above the carina. Nasogastric tube tip and side port are below the diaphragm. No pneumothorax. There is scarring with volume loss in both  upper lobes, slightly more severe on the right than on the left. There is also consolidation with effusion in the right base. No new opacity is evident. 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 There is atherosclerotic calcification in the aorta. No adenopathy evident. IMPRESSION:  Persistent volume loss and fibrosis in the upper lobes. Superimposed pneumonia in the upper lobes function on right, is of concern. There is patchy infiltrate in the right base with small right effusion. The overall appearance of the lungs is stable compared to 1 day prior. Tube positions are as described without pneumothorax. Cardiac silhouette is within normal limits and stable. Electronically Signed   By: Lowella Grip III M.D.   On: 10/16/2015 11:51   Medications:  Prior to Admission:  Prescriptions Prior to Admission  Medication Sig Dispense Refill Last Dose  . adalimumab (HUMIRA PEN) 40 MG/0.8ML injection Inject 40 mg into the skin every 14 (fourteen) days.    Past Month at Unknown time  . albuterol (PROAIR HFA) 108 (90 BASE) MCG/ACT inhaler INHALE 2 PUFFS INTO THE LUNGS EVERY 6 HOURS AS NEEDED FOR WHEEZING OR SHORTNESS OF BREATH 1 Inhaler 2 Past Week at Unknown time  . albuterol (PROVENTIL) (2.5 MG/3ML) 0.083% nebulizer solution USE ONE VIAL IN NEBULIZER EVERY 6 HOURS AS NEEDED FOR WHEEZING AND FOR SHORTNESS OF BREATH 1080 mL 2 Past Week at Unknown time  . ALPRAZolam (XANAX) 0.5 MG tablet Take 1 tablet (0.5 mg total) by mouth 2 (two) times daily. 180 tablet 0 Past Week at Unknown time  . amitriptyline (ELAVIL) 50 MG tablet Take 1 tablet by mouth at  bedtime 90 tablet 1 Past Week at Unknown time  . aspirin 325 MG tablet Take 325 mg by mouth daily.     Past Week at Unknown time  . buPROPion (WELLBUTRIN XL) 150 MG 24 hr tablet Take 1 tablet by mouth  daily 90 tablet 0 Past Week at Unknown time  . cetirizine (ALL DAY  ALLERGY) 10 MG tablet Take 10 mg by mouth daily.   Past Week at Unknown time  . cholecalciferol (VITAMIN D) 1000 UNITS tablet Take 1,000 Units by mouth daily.   Past Week at Unknown time  . EQL CALCIUM CITRATE/VITAMIN D PO Take by mouth. Vitamin D 500IU with Calcium Citrate 644m   Past Week at Unknown time  . Fluticasone Furoate-Vilanterol (BREO ELLIPTA) 100-25 MCG/INH AEPB Inhale 1 puff into the lungs daily.    Past Week at Unknown time  . furosemide (LASIX) 20 MG tablet Take 1 tablet by mouth  daily 90 tablet 0 Past Week at Unknown time  . guaifenesin (MUCUS RELIEF) 400 MG TABS tablet Take 400 mg by mouth daily as needed (for ocngestion).   unknown  . HYDROcodone-acetaminophen (NORCO) 10-325 MG tablet Take 1 tablet by mouth every 8 (eight) hours as needed. (Patient taking differently: Take 1 tablet by mouth every 8 (eight) hours as needed for moderate pain. ) 60 tablet 0 Past Week at Unknown time  . metoprolol (LOPRESSOR) 50 MG tablet Take 1 tablet by mouth two  times daily 180 tablet 0 Past Week at Unknown time  . tiotropium (SPIRIVA) 18 MCG inhalation capsule Place 1 capsule (18 mcg total) into inhaler and inhale every morning. 90 capsule 0 Past Week at Unknown time   Scheduled: . amitriptyline  50 mg Oral QHS  . antiseptic oral rinse  7 mL Mouth Rinse QID  . aspirin  325 mg Oral Daily  . azithromycin  500 mg Intravenous Q24H  . buPROPion  150 mg Oral Daily  . cefTRIAXone (ROCEPHIN)  IV  1 g Intravenous Q24H  . chlorhexidine gluconate (SAGE KIT)  15 mL Mouth Rinse BID  . cholecalciferol  1,000 Units Oral Daily  . enoxaparin (LOVENOX) injection  40 mg Subcutaneous Q24H  . famotidine (PEPCID) IV  20 mg Intravenous Q12H  . fluticasone furoate-vilanterol  1 puff Inhalation Daily  . ipratropium  0.5 mg Nebulization Q6H  . levalbuterol  0.63 mg Nebulization Q6H  . loratadine  10 mg Oral Daily  . methylPREDNISolone (SOLU-MEDROL) injection  60 mg Intravenous Q6H  . metoprolol  50 mg Oral BID    Continuous: . fentaNYL infusion INTRAVENOUS 50 mcg/hr (10/17/15 1619)  . propofol (DIPRIVAN) infusion 20 mcg/kg/min (10/18/15 0700)   QMK:JIZXYOFV (SUBLIMAZE) injection, fentaNYL, guaiFENesin, HYDROcodone-acetaminophen, levalbuterol, LORazepam  Assesment: She was admitted with community-acquired pneumonia. She then developed increasing problems with acute hypoxic and hypercapnic respiratory failure requiring intubation and mechanical ventilation. At baseline she has severe COPD. She also has some element of diastolic heart failure. We will try weaning again today. Ventilator adjustments have been made and she would be okay to come off the ventilator if she is okay clinically. She is still alkalemia but better than yesterday. Principal Problem:   CAP (community acquired pneumonia) Active Problems:   HTN (hypertension)   RA (rheumatoid arthritis) (HCC)   Acute and chronic respiratory failure with hypercapnia (HCC)   COPD with acute exacerbation (HCC)   Anxiety   Chronic diastolic CHF (congestive heart failure) (HCC)   Macrocytic anemia   Acute respiratory failure with hypoxia and hypercapnia ([place])    Plan: Continue current treatments. Discussed with Dr. Marin Comment and he plans to give her some more Diamox and replace potassium.    LOS: 3 days   Early Steel L 10/18/2015, 7:55 AM

## 2015-10-18 NOTE — Progress Notes (Signed)
[facility] PROGRESS NOTE  [name] [id number] DOB: 1945/08/20    PCP:   Chevis Pretty, FNP   HPI:  70 year old woman admitted on 7/30 from home with complaints of shortness of breath and cough. She was found to have community-acquired pneumonia. She does have a history of hypoxic/hypercarbic chronic respiratory failure due to COPD. was intubated on 7/31 due to unresponsiveness and a PCO2 of 92.  She was given one dose of Diamox IV, and has some improvement.  Dr Standley Dakins was consulted, and felt she may be able to be wean off today.    Rewiew of Systems: Unable.   Past Medical History:  Diagnosis Date  . Anxiety disorder   . Atrial fibrillation ([place])   . Avascular necrosis of hip ([place])    left  . CHF (congestive heart failure) ([place])   . COPD (chronic obstructive pulmonary disease) ([place])   . ETOH abuse   . HTN (hypertension)   . Hyperglycemia   . On home O2    3L N/C  . Psoriasis     Past Surgical History:  Procedure Laterality Date  . JOINT REPLACEMENT    . TONSILLECTOMY    . TOTAL HIP ARTHROPLASTY  05-15-08   r hip    Medications:  HOME MEDS: Prior to Admission medications   Medication Sig Start Date End Date Taking? Authorizing Provider  adalimumab (HUMIRA PEN) 40 MG/0.8ML injection Inject 40 mg into the skin every 14 (fourteen) days.    Yes Historical Provider, MD  albuterol (PROAIR HFA) 108 (90 BASE) MCG/ACT inhaler INHALE 2 PUFFS INTO THE LUNGS EVERY 6 HOURS AS NEEDED FOR WHEEZING OR SHORTNESS OF BREATH 03/07/15  Yes Mary-Margaret Hassell Done, FNP  albuterol (PROVENTIL) (2.5 MG/3ML) 0.083% nebulizer solution USE ONE VIAL IN NEBULIZER EVERY 6 HOURS AS NEEDED FOR WHEEZING AND FOR SHORTNESS OF BREATH 12/01/14  Yes Mary-Margaret Hassell Done, FNP  ALPRAZolam Duanne Moron) 0.5 MG tablet Take 1 tablet (0.5 mg total) by mouth 2 (two) times daily. 07/17/15  Yes Mary-Margaret Hassell Done, FNP  amitriptyline (ELAVIL) 50 MG tablet Take 1 tablet by mouth at  bedtime 12/23/14  Yes  Mary-Margaret Hassell Done, FNP  aspirin 325 MG tablet Take 325 mg by mouth daily.     Yes Historical Provider, MD  buPROPion (WELLBUTRIN XL) 150 MG 24 hr tablet Take 1 tablet by mouth  daily 10/04/15  Yes Mary-Margaret Hassell Done, FNP  cetirizine (ALL DAY ALLERGY) 10 MG tablet Take 10 mg by mouth daily.   Yes Historical Provider, MD  cholecalciferol (VITAMIN D) 1000 UNITS tablet Take 1,000 Units by mouth daily.   Yes Historical Provider, MD  EQL CALCIUM CITRATE/VITAMIN D PO Take by mouth. Vitamin D 500IU with Calcium Citrate 630mg    Yes Historical Provider, MD  Fluticasone Furoate-Vilanterol (BREO ELLIPTA) 100-25 MCG/INH AEPB Inhale 1 puff into the lungs daily.    Yes Historical Provider, MD  furosemide (LASIX) 20 MG tablet Take 1 tablet by mouth  daily 10/04/15  Yes Mary-Margaret Hassell Done, FNP  guaifenesin (MUCUS RELIEF) 400 MG TABS tablet Take 400 mg by mouth daily as needed (for ocngestion).   Yes Historical Provider, MD  HYDROcodone-acetaminophen (NORCO) 10-325 MG tablet Take 1 tablet by mouth every 8 (eight) hours as needed. Patient taking differently: Take 1 tablet by mouth every 8 (eight) hours as needed for moderate pain.  05/25/15  Yes Mary-Margaret Hassell Done, FNP  metoprolol (LOPRESSOR) 50 MG tablet Take 1 tablet by mouth two  times daily 10/04/15  Yes Mary-Margaret Hassell Done, FNP  tiotropium [facility])  18 MCG inhalation capsule Place 1 capsule (18 mcg total) into inhaler and inhale every morning. 06/26/15  Yes Mary-Margaret Hassell Done, FNP     Allergies:  Allergies  Allergen Reactions  . Celexa [Citalopram Hydrobromide]     Jerky movements  . Nickel   . Tramadol Hcl     REACTION: nausea and vomiting    Social History:   reports that she quit smoking about 6 years ago. She has a 35.00 pack-year smoking history. She has never used smokeless tobacco. She reports that she drinks alcohol. She reports that she does not use drugs.  Family History: Family History  Problem Relation Age of Onset  . Heart  disease Father   . Hyperlipidemia Father   . Hypertension Father   . Heart attack Father   . Cancer Paternal Grandfather   . Alzheimer's disease Mother   . Diabetes Sister   . Hyperlipidemia Sister   . Hypertension Sister   . Heart attack Sister   . Peripheral vascular disease Sister   . Heart disease Brother   . Hyperlipidemia Brother   . Heart attack Brother      Physical Exam: Vitals:   10/18/15 0700 10/18/15 0730 10/18/15 0747 10/18/15 0800  BP: (!) 150/82 (!) 162/96  (!) 160/87  Pulse: (!) 42 (!) 119  64  Resp: (!) 8 18    Temp:   98 F (36.7 C)   TempSrc:   Axillary   SpO2: 100% 100%  100%  Weight:      Height:       Blood pressure (!) 160/87, pulse 64, temperature 98 F (36.7 C), temperature source Axillary, resp. rate 18, height 5' (1.524 m), weight 42.5 kg (93 lb 11.1 oz), SpO2 100 %.  GEN: patient lying in the stretcher in no acute distress; intubated.  HEENT: Mucous membranes pink and anicteric; PERRLA; EOM intact; no cervical lymphadenopathy nor thyromegaly or carotid bruit; no JVD; There were no stridor. Neck is very supple. Breasts:: Not examined CHEST WALL: No tenderness CHEST: mechanically ventilated.  HEART: Regular rate and rhythm.  There are no murmur, rub, or gallops.   BACK: No kyphosis or scoliosis; no CVA tenderness ABDOMEN: soft and non-tender; no masses, no organomegaly, normal abdominal bowel sounds; no pannus; no intertriginous candida. There is no rebound and no distention. Rectal Exam: Not done EXTREMITIES: No bone or joint deformity; age-appropriate arthropathy of the hands and knees; no edema; no ulcerations.  There is no calf tenderness. Genitalia: not examined PULSES: 2+ and symmetric SKIN: Normal hydration no rash or ulceration CNS: Opened eyes, sedated.   Labs on Admission:  Basic Metabolic Panel:  Recent Labs Lab 10/15/15 1629 10/16/15 0433 10/17/15 0458 10/18/15 0427  NA 142 143 142 138  K 3.6 3.7 2.8* 3.0*  CL 83* 86*  91* 94*  CO2 54* 50* 40* 33*  GLUCOSE 109* 153* 133* 160*  BUN 17 17 30* 37*  CREATININE 0.71 0.69 1.11* 0.99  CALCIUM 11.0* 10.4* 10.5* 9.8  MG  --   --  1.0*  --    CBC:  Recent Labs Lab 10/15/15 1629 10/16/15 0433 10/16/15 0434 10/17/15 0458 10/18/15 0427  WBC 6.6 5.0  --  7.5 9.9  NEUTROABS 4.8 4.2  --   --   --   HGB 9.7* 9.1*  --  8.5* 9.4*  HCT 34.6* 31.0* 30.1* 29.2* 31.1*  MCV 105.8* 104.0*  --  104.3* 97.8  PLT 216 215  --  241 249  Cardiac Enzymes:  Recent Labs Lab 10/15/15 1629  TROPONINI <0.03   Radiological Exams on Admission: Dg Chest Port 1 View  Result Date: 10/18/2015 CLINICAL DATA:  Respiratory failure EXAM: PORTABLE CHEST 1 VIEW COMPARISON:  10/17/2015 FINDINGS: Endotracheal tube in good position.  NG tube in the stomach Fibro cavitary lesions in the apices bilaterally are stable. No air-fluid levels. Pleural thickening right lung base unchanged. No new area of infiltrate. Negative for heart failure. IMPRESSION: Endotracheal tube in good position.  No change from yesterday. Electronically Signed   By: Franchot Gallo M.D.   On: 10/18/2015 07:04   Dg Chest Port 1 View  Result Date: 10/17/2015 CLINICAL DATA:  Worsening shortness of breath and productive cough for 2 days. Acute respiratory failure. EXAM: PORTABLE CHEST 1 VIEW COMPARISON:  10/16/2015 FINDINGS: Endotracheal tube tip is not well seen. Enteric tube courses into the upper abdomen with tip not imaged. The cardiac silhouette is normal in size. Scarring and architectural distortion with volume loss are again seen in the upper lobes. More confluent opacity in the right upper lobe is unchanged, as is patchy right basilar opacity. There may be small pleural effusions. No pneumothorax is identified. 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 Electronically Signed   By: Logan Bores M.D.   On: 10/17/2015 07:21   Dg Chest Port 1  View  Result Date: 10/16/2015 CLINICAL DATA:  Hypoxia EXAM: PORTABLE CHEST 1 VIEW COMPARISON:  October 15, 2015 chest radiograph and chest CT March 31, 2014 FINDINGS: Endotracheal tube tip is 1.8 cm above the carina. Nasogastric tube tip and side port are below the diaphragm. No pneumothorax. There is scarring with volume loss in both upper lobes, slightly more severe on the right than on the left. There is also consolidation with effusion in the right base. No new opacity is evident. 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 There is atherosclerotic calcification in the aorta. No adenopathy evident. IMPRESSION: Persistent volume loss and fibrosis in the upper lobes. Superimposed pneumonia in the upper lobes function on right, is of concern. There is patchy infiltrate in the right base with small right effusion. The overall appearance of the lungs is stable compared to 1 day prior. Tube positions are as described without pneumothorax. Cardiac silhouette is within normal limits and stable. Electronically Signed   By: Lowella Grip III M.D.   On: 10/16/2015 11:51   EKG: Independently reviewed.   Assessment/Plan Present on Admission: . Acute and chronic respiratory failure with hypercapnia ([place]) . COPD with acute exacerbation ([place]) . HTN (hypertension) . Anxiety . RA (rheumatoid arthritis) ([place]) . Chronic diastolic CHF (congestive heart failure) ([place]) . CAP (community acquired pneumonia) . Macrocytic anemia   PLAN:  COPD with acute on chronic respiratory failure:  Wean and extubate as per pulmonary.  Continue with current antibiotics of IV Zithromax and Rocephin.  Metabolic alkalosis post intubation:  Will continue with IV Diamox for another 2 days. Follow bicarb.  Chronic diastolic CHF:  Compensated.  Hypokalemia:  Will continue with supplement.  Follow level in am.   Other plans as  per orders. Code Status: FULL Haskel Khan, MD.  FACP [facility] Pager [phone] 7pm to 7am.  10/18/2015, 8:12 AM

## 2015-10-18 NOTE — Care Management Important Message (Signed)
Important Message  Patient Details  Name: [name] MRN: [id number] Date of Birth: [birth year]   Medicare Important Message Given:  Yes    Sherald Barge, RN 10/18/2015, 11:38 AM

## 2015-10-18 NOTE — Progress Notes (Signed)
Nutrition Follow-up   DOCUMENTATION CODES:  Non-Severe (moderate) malnutrition in context of chronic illness, underweight  Pt meets criteria for at least MODERATE MALNUTRITION in the context of Chronic Illness as evidenced by Severe muscle wasting and mild-mod fat wasting  INTERVENTION:  Pt has atleast chronic malnutrition of moderate degree at baseline, however she is nearing criteria for severe malnutrition in acute illness given no nutrition x 3 days.   Unless it is very strongly anticipated that pt will be extubated in next 24 hours, highly recommend initiation of TF   Vital High Protein @ 40 ml (960 ml per day) which will provide 960 kcal, 84 gr protein and 802 ml water   NUTRITION DIAGNOSIS:  Inadequate oral intake related to inability to eat as evidenced by NPO status  GOAL:  Patient will meet greater than or equal to 90% of their needs   MONITOR: Vent status, Weight trends, Labs, Diet advancement, I & O's, TF tolerance  ASSESSMENT:   70 y.o. female PMHx  COPD on O2 at baseline, CHF, HTN, RA, Depression/Anxiety. Presented to ED w/ a couple days of progressive worsening dyspnea and new productive cough. Admitted for PNA and COPD exacerbation. Initially placed on Bipap, but ultimately required intubation on 7/31  Abdomen soft, non distended  Patient is currently intubated on ventilator support MV: 5 L/min Temp (24hrs), Avg:98.1 F (36.7 C), Min:97.7 F (36.5 C), Max:98.8 F (37.1 C)  Propofol: 5 ml/hr = 132 Kcal daily  Physical exam conduct. Mild fat wasting of orbital region and thoracic cavity. Mild muscle wasting to accromion/deltoid region, Moderate wasting of muscle surrounding clavicle. Severe muscle wasting of quadriceps, gastrocnemius and those in patellar region.   Labs reviewed: CBGs: 130-160, hypokalemia, TG:242 hypomagnesemia, WBC: 9.9, RBC: 3.18, H/H:9.4/31.1  Medications: Diamox, ABx, Vit D, methylprednisolone, KCL   Recent Labs Lab 10/16/15 0433  10/17/15 0458 10/18/15 0427  NA 143 142 138  K 3.7 2.8* 3.0*  CL 86* 91* 94*  CO2 50* 40* 33*  BUN 17 30* 37*  CREATININE 0.69 1.11* 0.99  CALCIUM 10.4* 10.5* 9.8  MG  --  1.0*  --   GLUCOSE 153* 133* 160*   Diet Order:  Diet NPO time specified  Skin: Dry, blister to ankle  Last BM:  Unknown  Height:  Ht Readings from Last 1 Encounters:  10/16/15 5' (1.524 m)   Weight:  Wt Readings from Last 1 Encounters:  10/18/15 93 lb 11.1 oz (42.5 kg)   Wt Readings from Last 10 Encounters:  10/18/15 93 lb 11.1 oz (42.5 kg)  12/23/14 101 lb (45.8 kg)  11/11/14 102 lb (46.3 kg)  06/09/14 110 lb (49.9 kg)  05/25/14 110 lb (49.9 kg)  04/14/14 111 lb (50.3 kg)  04/07/14 112 lb (50.8 kg)  03/31/14 110 lb (49.9 kg)  01/05/14 115 lb 6.4 oz (52.3 kg)  08/25/13 116 lb (52.6 kg)  Admit weight: 91.5 lbs.   Ideal Body Weight:  45 kg  BMI:  Body mass index is 17.9 kg/m.  Estimated Nutritional Needs:  Kcal:  1030 Protein:  62-75 g pro (1.5-1.8 g/kg bw) Fluid:  >1.2 liters daily  EDUCATION NEEDS:No education needs identified at this time   Burtis Junes RD, LDN, CNSC Clinical Nutrition Pager: [phone] 10/18/2015 1:34 PM

## 2015-10-19 LAB — GLUCOSE, CAPILLARY
GLUCOSE-CAPILLARY: 147 mg/dL — AB (ref 65–99)
Glucose-Capillary: 161 mg/dL — ABNORMAL HIGH (ref 65–99)
Glucose-Capillary: 192 mg/dL — ABNORMAL HIGH (ref 65–99)

## 2015-10-19 LAB — BASIC METABOLIC PANEL
Anion gap: 4 — ABNORMAL LOW (ref 5–15)
BUN: 36 mg/dL — AB (ref 6–20)
CHLORIDE: 98 mmol/L — AB (ref 101–111)
CO2: 35 mmol/L — ABNORMAL HIGH (ref 22–32)
Calcium: 9 mg/dL (ref 8.9–10.3)
Creatinine, Ser: 0.89 mg/dL (ref 0.44–1.00)
GFR calc Af Amer: >60 mL/min (ref >60)
GFR calc non Af Amer: >60 mL/min (ref >60)
Glucose, Bld: 144 mg/dL — ABNORMAL HIGH (ref 65–99)
POTASSIUM: 3.1 mmol/L — AB (ref 3.5–5.1)
SODIUM: 137 mmol/L (ref 135–145)

## 2015-10-19 LAB — PHOSPHORUS
PHOSPHORUS: 2.9 mg/dL (ref 2.5–4.6)
PHOSPHORUS: 3.2 mg/dL (ref 2.5–4.6)

## 2015-10-19 LAB — BLOOD GAS, ARTERIAL
Acid-Base Excess: 6.2 mmol/L — ABNORMAL HIGH (ref 0.0–2.0)
BICARBONATE: 28.4 meq/L — AB (ref 20.0–24.0)
DRAWN BY: 2223
FIO2: 35
LHR: 10 {breaths}/min
MECHVT: 350 mL
O2 Saturation: 96.6 %
PEEP/CPAP: 5 cmH2O
TCO2: 16.5 mmol/L (ref 0–100)
pCO2 arterial: 67.5 mmHg (ref 35.0–45.0)
pH, Arterial: 7.301 — ABNORMAL LOW (ref 7.350–7.450)
pO2, Arterial: 105 mmHg — ABNORMAL HIGH (ref 80.0–100.0)

## 2015-10-19 LAB — MAGNESIUM
MAGNESIUM: 1.4 mg/dL — AB (ref 1.7–2.4)
MAGNESIUM: 1.7 mg/dL (ref 1.7–2.4)
Magnesium: 2.3 mg/dL (ref 1.7–2.4)

## 2015-10-19 LAB — PROCALCITONIN: PROCALCITONIN: 14.81 ng/mL

## 2015-10-19 MED ORDER — VITAL HIGH PROTEIN PO LIQD
1000.0000 mL | ORAL | Status: DC
  Administered 2015-10-19: 1000 mL
  Filled 2015-10-19 (×2): qty 1000

## 2015-10-19 MED ORDER — POTASSIUM CHLORIDE 10 MEQ/100ML IV SOLN
10.0000 meq | INTRAVENOUS | Status: AC
  Administered 2015-10-19 (×4): 10 meq via INTRAVENOUS
  Filled 2015-10-19 (×4): qty 100

## 2015-10-19 MED ORDER — VITAL HIGH PROTEIN PO LIQD
1000.0000 mL | ORAL | Status: DC
  Filled 2015-10-19 (×4): qty 1000

## 2015-10-19 MED ORDER — MAGNESIUM SULFATE 2 GM/50ML IV SOLN
2.0000 g | Freq: Once | INTRAVENOUS | Status: AC
  Administered 2015-10-19: 2 g via INTRAVENOUS
  Filled 2015-10-19: qty 50

## 2015-10-19 MED ORDER — PRO-STAT SUGAR FREE PO LIQD
30.0000 mL | Freq: Two times a day (BID) | ORAL | Status: DC
  Administered 2015-10-19: 30 mL
  Filled 2015-10-19: qty 30

## 2015-10-19 NOTE — Progress Notes (Signed)
Nutrition Follow-up   DOCUMENTATION CODES:  Non-Severe (moderate) malnutrition in context of chronic illness, underweight  Pt meets criteria for at least MODERATE MALNUTRITION in the context of Chronic Illness as evidenced by Severe muscle wasting and mild-mod fat wasting  INTERVENTION:  To prevent overfeeding and hypercapnia, Decrease Vital High Protein to 30 ml (720 ml per day) which will provide 720 kcal (+132 from propofol), 63 gr protein and 602 ml water.   If IVF to be stopped, Flush 100cc q 4 hrs  NUTRITION DIAGNOSIS:  Inadequate oral intake related to inability to eat as evidenced by NPO status  GOAL:  Patient will meet greater than or equal to 90% of their needs   MONITOR: Vent status, Weight trends, Labs, Diet advancement, I & O's, TF tolerance  ASSESSMENT:   70 y.o. female PMHx  COPD on O2 at baseline, CHF, HTN, RA, Depression/Anxiety. Presented to ED w/ a couple days of progressive worsening dyspnea and new productive cough. Admitted for PNA and COPD exacerbation. Initially placed on Bipap, but ultimately required intubation on 7/31  Abdomen soft, non distended. Vital HP running at 40  Noted MVE and recent t max have decreased and needs will decrease accordingly.   Patient is currently intubated on ventilator support MV: 4.3 L/min Temp (24hrs), Avg:97.2 F (36.2 C), Min:96.7 F (35.9 C), Max:97.6 F (36.4 C)  Propofol: 5 ml/hr = 132 Kcal daily  Labs reviewed: CBGs: 130-160, hypokalemia, TG:242 WBC: 9.9, RBC: 3.18, H/H:9.4/31.1  Medications:  ABx, Vit D, methylprednisolone   Recent Labs Lab 10/17/15 0458 10/18/15 0427 10/19/15 0420 10/19/15 0846  NA 142 138 137  --   K 2.8* 3.0* 3.1*  --   CL 91* 94* 98*  --   CO2 40* 33* 35*  --   BUN 30* 37* 36*  --   CREATININE 1.11* 0.99 0.89  --   CALCIUM 10.5* 9.8 9.0  --   MG 1.0*  --  1.4* 1.7  PHOS  --   --   --  2.9  GLUCOSE 133* 160* 144*  --    Diet Order:  Diet NPO time specified  Skin: Dry, blister  to ankle  Last BM:  8/2  Height:  Ht Readings from Last 1 Encounters:  10/16/15 5' (1.524 m)   Weight:  Wt Readings from Last 1 Encounters:  10/19/15 95 lb 0.3 oz (43.1 kg)   Wt Readings from Last 10 Encounters:  10/19/15 95 lb 0.3 oz (43.1 kg)  12/23/14 101 lb (45.8 kg)  11/11/14 102 lb (46.3 kg)  06/09/14 110 lb (49.9 kg)  05/25/14 110 lb (49.9 kg)  04/14/14 111 lb (50.3 kg)  04/07/14 112 lb (50.8 kg)  03/31/14 110 lb (49.9 kg)  01/05/14 115 lb 6.4 oz (52.3 kg)  08/25/13 116 lb (52.6 kg)  Admit weight: 91.5 lbs.   Ideal Body Weight:  45 kg  BMI:  Body mass index is 17.9 kg/m.  Estimated Nutritional Needs:  Kcal:  824 kcals Protein:  62-75 g pro (1.5-1.8 g/kg bw) Fluid:  >1.2 liters daily  EDUCATION NEEDS:No education needs identified at this time   Burtis Junes RD, LDN, [place] [facility] Pager: [phone] 10/19/2015 12:40 PM

## 2015-10-19 NOTE — Progress Notes (Signed)
**Note De-Identified  Obfuscation** Patient placed back onto full support due to anxiety, HR in 190s, RR in 40s and SAT 80%.  Will attempt to wean later in the am.

## 2015-10-19 NOTE — Progress Notes (Signed)
[facility] PROGRESS NOTE  [name] [id number] DOB: 1945-07-17    PCP:   Chevis Pretty, FNP   HPI:  69 year old woman admitted on 7/30 from home with complaints of shortness of breath and cough. She was found to have community-acquired pneumonia. She does have a history of hypoxic/hypercarbic chronic respiratory failure due to COPD. was intubated on 7/31 due to unresponsiveness and a PCO2 of 92.  She was given one dose of Diamox IV, and has some improvement.  Dr Standley Dakins was consulted, and tried weaning yesterday, but she wasn't able to be extubated.  She still has hypomagnesemic hypokalemia.  Rewiew of Systems: Unable.   Past Medical History:  Diagnosis Date  . Anxiety disorder   . Atrial fibrillation ([place])   . Avascular necrosis of hip ([place])    left  . CHF (congestive heart failure) ([place])   . COPD (chronic obstructive pulmonary disease) ([place])   . ETOH abuse   . HTN (hypertension)   . Hyperglycemia   . On home O2    3L N/C  . Psoriasis     Past Surgical History:  Procedure Laterality Date  . JOINT REPLACEMENT    . TONSILLECTOMY    . TOTAL HIP ARTHROPLASTY  05-15-08   r hip    Medications:  HOME MEDS: Prior to Admission medications   Medication Sig Start Date End Date Taking? Authorizing Provider  adalimumab (HUMIRA PEN) 40 MG/0.8ML injection Inject 40 mg into the skin every 14 (fourteen) days.    Yes Historical Provider, MD  albuterol (PROAIR HFA) 108 (90 BASE) MCG/ACT inhaler INHALE 2 PUFFS INTO THE LUNGS EVERY 6 HOURS AS NEEDED FOR WHEEZING OR SHORTNESS OF BREATH 03/07/15  Yes Mary-Margaret Hassell Done, FNP  albuterol (PROVENTIL) (2.5 MG/3ML) 0.083% nebulizer solution USE ONE VIAL IN NEBULIZER EVERY 6 HOURS AS NEEDED FOR WHEEZING AND FOR SHORTNESS OF BREATH 12/01/14  Yes Mary-Margaret Hassell Done, FNP  ALPRAZolam Duanne Moron) 0.5 MG tablet Take 1 tablet (0.5 mg total) by mouth 2 (two) times daily. 07/17/15  Yes Mary-Margaret Hassell Done, FNP  amitriptyline (ELAVIL) 50 MG  tablet Take 1 tablet by mouth at  bedtime 12/23/14  Yes Mary-Margaret Hassell Done, FNP  aspirin 325 MG tablet Take 325 mg by mouth daily.     Yes Historical Provider, MD  buPROPion (WELLBUTRIN XL) 150 MG 24 hr tablet Take 1 tablet by mouth  daily 10/04/15  Yes Mary-Margaret Hassell Done, FNP  cetirizine (ALL DAY ALLERGY) 10 MG tablet Take 10 mg by mouth daily.   Yes Historical Provider, MD  cholecalciferol (VITAMIN D) 1000 UNITS tablet Take 1,000 Units by mouth daily.   Yes Historical Provider, MD  EQL CALCIUM CITRATE/VITAMIN D PO Take by mouth. Vitamin D 500IU with Calcium Citrate 630mg    Yes Historical Provider, MD  Fluticasone Furoate-Vilanterol (BREO ELLIPTA) 100-25 MCG/INH AEPB Inhale 1 puff into the lungs daily.    Yes Historical Provider, MD  furosemide (LASIX) 20 MG tablet Take 1 tablet by mouth  daily 10/04/15  Yes Mary-Margaret Hassell Done, FNP  guaifenesin (MUCUS RELIEF) 400 MG TABS tablet Take 400 mg by mouth daily as needed (for ocngestion).   Yes Historical Provider, MD  HYDROcodone-acetaminophen (NORCO) 10-325 MG tablet Take 1 tablet by mouth every 8 (eight) hours as needed. Patient taking differently: Take 1 tablet by mouth every 8 (eight) hours as needed for moderate pain.  05/25/15  Yes Mary-Margaret Hassell Done, FNP  metoprolol (LOPRESSOR) 50 MG tablet Take 1 tablet by mouth two  times daily 10/04/15  Yes Mary-Margaret Hassell Done,  FNP  tiotropium (SPIRIVA) 18 MCG inhalation capsule Place 1 capsule (18 mcg total) into inhaler and inhale every morning. 06/26/15  Yes Mary-Margaret Hassell Done, FNP     Allergies:  Allergies  Allergen Reactions  . Celexa [Citalopram Hydrobromide]     Jerky movements  . Nickel   . Tramadol Hcl     REACTION: nausea and vomiting    Social History:   reports that she quit smoking about 6 years ago. She has a 35.00 pack-year smoking history. She has never used smokeless tobacco. She reports that she drinks alcohol. She reports that she does not use drugs.  Family History: Family  History  Problem Relation Age of Onset  . Heart disease Father   . Hyperlipidemia Father   . Hypertension Father   . Heart attack Father   . Cancer Paternal Grandfather   . Alzheimer's disease Mother   . Diabetes Sister   . Hyperlipidemia Sister   . Hypertension Sister   . Heart attack Sister   . Peripheral vascular disease Sister   . Heart disease Brother   . Hyperlipidemia Brother   . Heart attack Brother      Physical Exam: Vitals:   10/19/15 0530 10/19/15 0545 10/19/15 0600 10/19/15 0738  BP: 125/83 (!) 151/84 133/89   Pulse: 61 62 (!) 59   Resp: 11 11 11    Temp:    97.6 F (36.4 C)  TempSrc:    Axillary  SpO2: 100% 100% 100%   Weight:      Height:       Blood pressure 133/89, pulse (!) 59, temperature 97.6 F (36.4 C), temperature source Axillary, resp. rate 11, height 5' (1.524 m), weight 43.1 kg (95 lb 0.3 oz), SpO2 100 %.  GEN:  Awake, intubated, trying to talk.  HEENT: Mucous membranes pink and anicteric; PERRLA; EOM intact; no cervical lymphadenopathy nor thyromegaly or carotid bruit; no JVD; There were no stridor. Neck is very supple. Breasts:: Not examined CHEST WALL: No tenderness CHEST: Normal respiration, clear to auscultation bilaterally.  HEART: Regular rate and rhythm.  There are no murmur, rub, or gallops.   BACK: No kyphosis or scoliosis; no CVA tenderness ABDOMEN: soft and non-tender; no masses, no organomegaly, normal abdominal bowel sounds; no pannus; no intertriginous candida. There is no rebound and no distention. Rectal Exam: Not done EXTREMITIES: No bone or joint deformity; age-appropriate arthropathy of the hands and knees; no edema; no ulcerations.  There is no calf tenderness. Genitalia: not examined PULSES: 2+ and symmetric SKIN: Normal hydration no rash or ulceration CNS: Cranial nerves 2-12 grossly intact no focal lateralizing neurologic deficit.  Speech is fluent; uvula elevated with phonation, facial symmetry and tongue midline. DTR  are normal bilaterally, cerebella exam is intact, barbinski is negative and strengths are equaled bilaterally.  No sensory loss.   Labs on Admission:  Basic Metabolic Panel:  Recent Labs Lab 10/15/15 1629 10/16/15 0433 10/17/15 0458 10/18/15 0427 10/19/15 0420  NA 142 143 142 138 137  K 3.6 3.7 2.8* 3.0* 3.1*  CL 83* 86* 91* 94* 98*  CO2 54* 50* 40* 33* 35*  GLUCOSE 109* 153* 133* 160* 144*  BUN 17 17 30* 37* 36*  CREATININE 0.71 0.69 1.11* 0.99 0.89  CALCIUM 11.0* 10.4* 10.5* 9.8 9.0  MG  --   --  1.0*  --  1.4*   CBC:  Recent Labs Lab 10/15/15 1629 10/16/15 0433 10/16/15 0434 10/17/15 0458 10/18/15 0427  WBC 6.6 5.0  --  7.5 9.9  NEUTROABS 4.8 4.2  --   --   --   HGB 9.7* 9.1*  --  8.5* 9.4*  HCT 34.6* 31.0* 30.1* 29.2* 31.1*  MCV 105.8* 104.0*  --  104.3* 97.8  PLT 216 215  --  241 249   Cardiac Enzymes:  Recent Labs Lab 10/15/15 1629  TROPONINI <0.03   Radiological Exams on Admission: Dg Chest Port 1 View  Result Date: 10/18/2015 CLINICAL DATA:  Respiratory failure EXAM: PORTABLE CHEST 1 VIEW COMPARISON:  10/17/2015 FINDINGS: Endotracheal tube in good position.  NG tube in the stomach Fibro cavitary lesions in the apices bilaterally are stable. No air-fluid levels. Pleural thickening right lung base unchanged. No new area of infiltrate. Negative for heart failure. IMPRESSION: Endotracheal tube in good position.  No change from yesterday. Electronically Signed   By: Franchot Gallo M.D.   On: 10/18/2015 07:04    EKG: Independently reviewed.   Assessment/Plan Present on Admission: . Acute and chronic respiratory failure with hypercapnia ([place]) . COPD with acute exacerbation ([place]) . HTN (hypertension) . Anxiety . RA (rheumatoid arthritis) ([place]) . Chronic diastolic CHF (congestive heart failure) ([place]) . CAP (community acquired pneumonia) . Macrocytic anemia  PLAN:  COPD with acute on chronic respiratory failure:  Continue to wean and hopefully she can  be extubated today.   Continue with current antibiotics of IV Zithromax and Rocephin.    Metabolic alkalosis post intubation:  She was given IV diamox, and I think her pCO2 and Bicarb is at her baseline at this time.   Chronic diastolic CHF:  Compensated.  Hypokalemia:  Still low today.  Will continue to supplement.   Other plans as per orders. Code Status: FULL Haskel Khan, MD.  FACP [facility] Pager [phone] 7pm to 7am.  10/19/2015, 7:49 AM

## 2015-10-19 NOTE — Progress Notes (Addendum)
Subjective: She remains intubated on the ventilator. She is more alert this morning.  Objective: Vital signs in last 24 hours: Temp:  [96.7 F (35.9 C)-98.1 F (36.7 C)] 97.6 F (36.4 C) (08/03 0738) Pulse Rate:  [35-126] 59 (08/03 0600) Resp:  [10-25] 11 (08/03 0600) BP: (86-153)/(49-121) 133/89 (08/03 0600) SpO2:  [93 %-100 %] 100 % (08/03 0600) FiO2 (%):  [35 %] 35 % (08/03 0423) Weight:  [43.1 kg (95 lb 0.3 oz)] 43.1 kg (95 lb 0.3 oz) (08/03 0400) Weight change: 0.6 kg (1 lb 5.2 oz) Last BM Date: 10/18/15  Intake/Output from previous day: 08/02 0701 - 08/03 0700 In: 649.2 [I.V.:149.2; IV Piggyback:500] Out: 1200 [Urine:1200]  PHYSICAL EXAM General appearance: Intubated sedated on the ventilator but more alert today Resp: rhonchi bilaterally and Generally diminished breath sounds Cardio: regular rate and rhythm, S1, S2 normal, no murmur, click, rub or gallop GI: soft, non-tender; bowel sounds normal; no masses,  no organomegaly Extremities: extremities normal, atraumatic, no cyanosis or edema  Lab Results:  Results for orders placed or performed during the hospital encounter of 10/15/15 (from the past 48 hour(s))  Blood gas, arterial     Status: Abnormal   Collection Time: 10/17/15  9:45 AM  Result Value Ref Range   FIO2 35.00    Delivery systems VENTILATOR    Mode PRESSURE REGULATED VOLUME CONTROL    VT 400 mL   LHR 10 resp/min   Peep/cpap 5.0 cm H20   pH, Arterial 7.666 (HH) 7.350 - 7.450    Comment: CRITICAL RESULT CALLED TO, READ BACK BY AND VERIFIED WITH: DAVIS,S.RN AT 1004 10/17/15 BY BROADNAX,L.RRT    pCO2 arterial 37.7 35.0 - 45.0 mmHg   pO2, Arterial 146.0 (H) 80.0 - 100.0 mmHg   Bicarbonate 44.3 (H) 20.0 - 24.0 mEq/L   Acid-Base Excess 20.0 (H) 0.0 - 2.0 mmol/L   O2 Saturation 99.6 %   Collection site LEFT BRACHIAL    Drawn by [id number]    Sample type ARTERIAL   Basic metabolic panel     Status: Abnormal   Collection Time: 10/18/15  4:27 AM  Result  Value Ref Range   Sodium 138 135 - 145 mmol/L   Potassium 3.0 (L) 3.5 - 5.1 mmol/L   Chloride 94 (L) 101 - 111 mmol/L   CO2 33 (H) 22 - 32 mmol/L   Glucose, Bld 160 (H) 65 - 99 mg/dL   BUN 37 (H) 6 - 20 mg/dL   Creatinine, Ser 0.99 0.44 - 1.00 mg/dL   Calcium 9.8 8.9 - 10.3 mg/dL   GFR calc non Af Amer 56 (L) >60 mL/min   GFR calc Af Amer >60 >60 mL/min    Comment: (NOTE) The eGFR has been calculated using the CKD EPI equation. This calculation has not been validated in all clinical situations. eGFR's persistently <60 mL/min signify possible Chronic Kidney Disease.    Anion gap 11 5 - 15  CBC     Status: Abnormal   Collection Time: 10/18/15  4:27 AM  Result Value Ref Range   WBC 9.9 4.0 - 10.5 K/uL   RBC 3.18 (L) 3.87 - 5.11 MIL/uL   Hemoglobin 9.4 (L) 12.0 - 15.0 g/dL   HCT 31.1 (L) 36.0 - 46.0 %   MCV 97.8 78.0 - 100.0 fL   MCH 29.6 26.0 - 34.0 pg   MCHC 30.2 30.0 - 36.0 g/dL   RDW 13.5 11.5 - 15.5 %   Platelets 249 150 - 400 K/uL  Triglycerides     Status: Abnormal   Collection Time: 10/18/15  4:27 AM  Result Value Ref Range   Triglycerides 242 (H) <150 mg/dL  Blood gas, arterial     Status: Abnormal   Collection Time: 10/18/15  5:54 AM  Result Value Ref Range   FIO2 0.35    Delivery systems VENTILATOR    Mode PRESSURE REGULATED VOLUME CONTROL    VT 350 mL   LHR 10 resp/min   Peep/cpap 5.0 cm H20   pH, Arterial 7.501 (H) 7.350 - 7.450   pCO2 arterial 45.8 (H) 35.0 - 45.0 mmHg   pO2, Arterial 144 (H) 80.0 - 100.0 mmHg   Bicarbonate 34.8 (H) 20.0 - 24.0 mEq/L   Acid-Base Excess 11.5 (H) 0.0 - 2.0 mmol/L   O2 Saturation 99.0 %   Patient temperature 37.0    Collection site LEFT RADIAL    Drawn by [id number]    Sample type ARTERIAL DRAW    Allens test (pass/fail) POSITIVE (A) PASS  Procalcitonin     Status: None   Collection Time: 10/19/15  4:20 AM  Result Value Ref Range   Procalcitonin 14.81 ng/mL    Comment:        Interpretation: PCT >= 10 ng/mL: Important  systemic inflammatory response, almost exclusively due to severe bacterial sepsis or septic shock. (NOTE)         ICU PCT Algorithm               Non ICU PCT Algorithm    ----------------------------     ------------------------------         PCT < 0.25 ng/mL                 PCT < 0.1 ng/mL     Stopping of antibiotics            Stopping of antibiotics       strongly encouraged.               strongly encouraged.    ----------------------------     ------------------------------       PCT level decrease by               PCT < 0.25 ng/mL       >= 80% from peak PCT       OR PCT 0.25 - 0.5 ng/mL          Stopping of antibiotics                                             encouraged.     Stopping of antibiotics           encouraged.    ----------------------------     ------------------------------       PCT level decrease by              PCT >= 0.25 ng/mL       < 80% from peak PCT        AND PCT >= 0.5 ng/mL             Continuing antibiotics                                              encouraged.  Continuing antibiotics            encouraged.    ----------------------------     ------------------------------     PCT level increase compared          PCT > 0.5 ng/mL         with peak PCT AND          PCT >= 0.5 ng/mL             Escalation of antibiotics                                          strongly encouraged.      Escalation of antibiotics        strongly encouraged.   Basic metabolic panel     Status: Abnormal   Collection Time: 10/19/15  4:20 AM  Result Value Ref Range   Sodium 137 135 - 145 mmol/L   Potassium 3.1 (L) 3.5 - 5.1 mmol/L   Chloride 98 (L) 101 - 111 mmol/L   CO2 35 (H) 22 - 32 mmol/L   Glucose, Bld 144 (H) 65 - 99 mg/dL   BUN 36 (H) 6 - 20 mg/dL   Creatinine, Ser 0.89 0.44 - 1.00 mg/dL   Calcium 9.0 8.9 - 10.3 mg/dL   GFR calc non Af Amer >60 >60 mL/min   GFR calc Af Amer >60 >60 mL/min    Comment: (NOTE) The eGFR has been calculated using the CKD  EPI equation. This calculation has not been validated in all clinical situations. eGFR's persistently <60 mL/min signify possible Chronic Kidney Disease.    Anion gap 4 (L) 5 - 15  Magnesium     Status: Abnormal   Collection Time: 10/19/15  4:20 AM  Result Value Ref Range   Magnesium 1.4 (L) 1.7 - 2.4 mg/dL  Blood gas, arterial     Status: Abnormal   Collection Time: 10/19/15  4:31 AM  Result Value Ref Range   FIO2 35.00    Delivery systems VENTILATOR    Mode PRESSURE REGULATED VOLUME CONTROL    VT 350 mL   LHR 10.0 resp/min   Peep/cpap 5.0 cm H20   pH, Arterial 7.301 (L) 7.350 - 7.450   pCO2 arterial 67.5 (HH) 35.0 - 45.0 mmHg    Comment: CRITICAL RESULT CALLED TO, READ BACK BY AND VERIFIED WITH: CINDY PHILLIPS,RN BY K KNICK RRT,RCP ON 10/19/2015 AT 0440    pO2, Arterial 105.0 (H) 80.0 - 100.0 mmHg   Bicarbonate 28.4 (H) 20.0 - 24.0 mEq/L   TCO2 16.5 0 - 100 mmol/L   Acid-Base Excess 6.2 (H) 0.0 - 2.0 mmol/L   O2 Saturation 96.6 %   Collection site RIGHT RADIAL    Drawn by [id number]    Sample type ARTERIAL    Allens test (pass/fail) PASS PASS    ABGS  Recent Labs  10/19/15 0431  PHART 7.301*  PO2ART 105.0*  TCO2 16.5  HCO3 28.4*   CULTURES Recent Results (from the past 240 hour(s))  MRSA PCR Screening     Status: None   Collection Time: 10/15/15  9:54 PM  Result Value Ref Range Status   MRSA by PCR NEGATIVE NEGATIVE Final    Comment:        The GeneXpert MRSA Assay (FDA approved for NASAL specimens only), is one component of a comprehensive MRSA colonization surveillance program. It  is not intended to diagnose MRSA infection nor to guide or monitor treatment for MRSA infections.   Culture, expectorated sputum-assessment     Status: None   Collection Time: 10/16/15  1:43 PM  Result Value Ref Range Status   Specimen Description TRACHEAL ASPIRATE  Final   Special Requests NONE  Final   Sputum evaluation   Final    THIS SPECIMEN IS ACCEPTABLE. RESPIRATORY  CULTURE REPORT TO FOLLOW. Performed at [facility]    Report Status 10/16/2015 FINAL  Final  Culture, respiratory (NON-Expectorated)     Status: None (Preliminary result)   Collection Time: 10/16/15  1:43 PM  Result Value Ref Range Status   Specimen Description TRACHEAL ASPIRATE  Final   Special Requests NONE  Final   Gram Stain   Final    ABUNDANT WBC PRESENT,BOTH PMN AND MONONUCLEAR NO ORGANISMS SEEN    Culture   Final    MODERATE GRAM NEGATIVE RODS CULTURE REINCUBATED FOR BETTER GROWTH Performed at [facility]    Report Status PENDING  Incomplete   Studies/Results: Dg Chest Port 1 View  Result Date: 10/18/2015 CLINICAL DATA:  Respiratory failure EXAM: PORTABLE CHEST 1 VIEW COMPARISON:  10/17/2015 FINDINGS: Endotracheal tube in good position.  NG tube in the stomach Fibro cavitary lesions in the apices bilaterally are stable. No air-fluid levels. Pleural thickening right lung base unchanged. No new area of infiltrate. Negative for heart failure. IMPRESSION: Endotracheal tube in good position.  No change from yesterday. Electronically Signed   By: Franchot Gallo M.D.   On: 10/18/2015 07:04    Medications:  Prior to Admission:  Prescriptions Prior to Admission  Medication Sig Dispense Refill Last Dose  . adalimumab (HUMIRA PEN) 40 MG/0.8ML injection Inject 40 mg into the skin every 14 (fourteen) days.    Past Month at Unknown time  . albuterol (PROAIR HFA) 108 (90 BASE) MCG/ACT inhaler INHALE 2 PUFFS INTO THE LUNGS EVERY 6 HOURS AS NEEDED FOR WHEEZING OR SHORTNESS OF BREATH 1 Inhaler 2 Past Week at Unknown time  . albuterol (PROVENTIL) (2.5 MG/3ML) 0.083% nebulizer solution USE ONE VIAL IN NEBULIZER EVERY 6 HOURS AS NEEDED FOR WHEEZING AND FOR SHORTNESS OF BREATH 1080 mL 2 Past Week at Unknown time  . ALPRAZolam (XANAX) 0.5 MG tablet Take 1 tablet (0.5 mg total) by mouth 2 (two) times daily. 180 tablet 0 Past Week at Unknown time  . amitriptyline (ELAVIL) 50 MG tablet  Take 1 tablet by mouth at  bedtime 90 tablet 1 Past Week at Unknown time  . aspirin 325 MG tablet Take 325 mg by mouth daily.     Past Week at Unknown time  . buPROPion (WELLBUTRIN XL) 150 MG 24 hr tablet Take 1 tablet by mouth  daily 90 tablet 0 Past Week at Unknown time  . cetirizine (ALL DAY ALLERGY) 10 MG tablet Take 10 mg by mouth daily.   Past Week at Unknown time  . cholecalciferol (VITAMIN D) 1000 UNITS tablet Take 1,000 Units by mouth daily.   Past Week at Unknown time  . EQL CALCIUM CITRATE/VITAMIN D PO Take by mouth. Vitamin D 500IU with Calcium Citrate '630mg'$    Past Week at Unknown time  . Fluticasone Furoate-Vilanterol (BREO ELLIPTA) 100-25 MCG/INH AEPB Inhale 1 puff into the lungs daily.    Past Week at Unknown time  . furosemide (LASIX) 20 MG tablet Take 1 tablet by mouth  daily 90 tablet 0 Past Week at Unknown time  . guaifenesin (MUCUS  RELIEF) 400 MG TABS tablet Take 400 mg by mouth daily as needed (for ocngestion).   unknown  . HYDROcodone-acetaminophen (NORCO) 10-325 MG tablet Take 1 tablet by mouth every 8 (eight) hours as needed. (Patient taking differently: Take 1 tablet by mouth every 8 (eight) hours as needed for moderate pain. ) 60 tablet 0 Past Week at Unknown time  . metoprolol (LOPRESSOR) 50 MG tablet Take 1 tablet by mouth two  times daily 180 tablet 0 Past Week at Unknown time  . tiotropium (SPIRIVA) 18 MCG inhalation capsule Place 1 capsule (18 mcg total) into inhaler and inhale every morning. 90 capsule 0 Past Week at Unknown time   Scheduled: . acetaZOLAMIDE  500 mg Intravenous Q12H  . amitriptyline  50 mg Oral QHS  . antiseptic oral rinse  7 mL Mouth Rinse QID  . aspirin  325 mg Oral Daily  . azithromycin  500 mg Intravenous Q24H  . buPROPion  150 mg Oral Daily  . cefTRIAXone (ROCEPHIN)  IV  1 g Intravenous Q24H  . chlorhexidine gluconate (SAGE KIT)  15 mL Mouth Rinse BID  . cholecalciferol  1,000 Units Oral Daily  . enoxaparin (LOVENOX) injection  40 mg  Subcutaneous Q24H  . famotidine (PEPCID) IV  20 mg Intravenous Q12H  . fluticasone furoate-vilanterol  1 puff Inhalation Daily  . ipratropium  0.5 mg Nebulization Q6H  . levalbuterol  0.63 mg Nebulization Q6H  . loratadine  10 mg Oral Daily  . magnesium sulfate 1 - 4 g bolus IVPB  2 g Intravenous Once  . methylPREDNISolone (SOLU-MEDROL) injection  60 mg Intravenous Q6H  . metoprolol  50 mg Oral BID  . potassium chloride  10 mEq Intravenous Q1 Hr x 4   Continuous: . fentaNYL infusion INTRAVENOUS 50 mcg/hr (10/19/15 0600)  . propofol (DIPRIVAN) infusion 20 mcg/kg/min (10/19/15 0600)   YFV:CBSWHQPR (SUBLIMAZE) injection, fentaNYL, guaiFENesin, HYDROcodone-acetaminophen, levalbuterol, LORazepam  Assesment: She was admitted with community-acquired pneumonia. She had markedly elevated PCO2 and then became unresponsive and was intubated because of that. She remains on the ventilator. Weaning has been attempted but failed. She has had low potassium and had very high PH with a post hypercapnic metabolic alkalosis. This has been treated with Diamox Principal Problem:   CAP (community acquired pneumonia) Active Problems:   HTN (hypertension)   RA (rheumatoid arthritis) ([place])   Acute and chronic respiratory failure with hypercapnia ([place])   COPD with acute exacerbation ([facility])   Anxiety   Chronic diastolic CHF (congestive heart failure) ([facility])   Macrocytic anemia   Acute respiratory failure with hypoxia and hypercapnia ([facility])   Protein-calorie malnutrition, severe    Plan: Continue current treatments. I think we may have more opportunity to wean today. If she's not able to be extubated today we need to start tube feedings   LOS: 4 days   [name] L 10/19/2015, 8:31 AM

## 2015-10-20 LAB — BLOOD GAS, ARTERIAL
ACID-BASE EXCESS: 5.7 mmol/L — AB (ref 0.0–2.0)
BICARBONATE: 29.3 meq/L — AB (ref 20.0–24.0)
Drawn by: 22223
FIO2: 35
LHR: 14 {breaths}/min
O2 SAT: 99 %
PCO2 ART: 48.6 mmHg — AB (ref 35.0–45.0)
PEEP: 5 cmH2O
PH ART: 7.41 (ref 7.350–7.450)
TCO2: 13 mmol/L (ref 0–100)
VT: 350 mL
pO2, Arterial: 150 mmHg — ABNORMAL HIGH (ref 80.0–100.0)

## 2015-10-20 LAB — BASIC METABOLIC PANEL
BUN: 40 mg/dL — AB (ref 6–20)
CO2: 32 mmol/L (ref 22–32)
Calcium: 9.6 mg/dL (ref 8.9–10.3)
Chloride: 104 mmol/L (ref 101–111)
Creatinine, Ser: 0.76 mg/dL (ref 0.44–1.00)
GFR calc Af Amer: >60 mL/min (ref >60)
GLUCOSE: 121 mg/dL — AB (ref 65–99)
POTASSIUM: 3 mmol/L — AB (ref 3.5–5.1)
Sodium: 138 mmol/L (ref 135–145)

## 2015-10-20 LAB — MAGNESIUM
MAGNESIUM: 1.7 mg/dL (ref 1.7–2.4)
Magnesium: 1.9 mg/dL (ref 1.7–2.4)

## 2015-10-20 LAB — GLUCOSE, CAPILLARY
GLUCOSE-CAPILLARY: 143 mg/dL — AB (ref 65–99)
Glucose-Capillary: 132 mg/dL — ABNORMAL HIGH (ref 65–99)
Glucose-Capillary: 151 mg/dL — ABNORMAL HIGH (ref 65–99)
Glucose-Capillary: 258 mg/dL — ABNORMAL HIGH (ref 65–99)

## 2015-10-20 LAB — CBC
HEMATOCRIT: 29.9 % — AB (ref 36.0–46.0)
Hemoglobin: 9.5 g/dL — ABNORMAL LOW (ref 12.0–15.0)
MCH: 31.3 pg (ref 26.0–34.0)
MCHC: 31.8 g/dL (ref 30.0–36.0)
MCV: 98.4 fL (ref 78.0–100.0)
Platelets: 257 10*3/uL (ref 150–400)
RBC: 3.04 MIL/uL — ABNORMAL LOW (ref 3.87–5.11)
RDW: 13.5 % (ref 11.5–15.5)
WBC: 12.3 10*3/uL — ABNORMAL HIGH (ref 4.0–10.5)

## 2015-10-20 LAB — CULTURE, RESPIRATORY

## 2015-10-20 LAB — PHOSPHORUS
Phosphorus: 2.3 mg/dL — ABNORMAL LOW (ref 2.5–4.6)
Phosphorus: 2.6 mg/dL (ref 2.5–4.6)

## 2015-10-20 LAB — CULTURE, RESPIRATORY W GRAM STAIN

## 2015-10-20 MED ORDER — POTASSIUM CHLORIDE CRYS ER 20 MEQ PO TBCR
40.0000 meq | EXTENDED_RELEASE_TABLET | Freq: Every day | ORAL | Status: DC
  Administered 2015-10-20 – 2015-10-25 (×6): 40 meq via ORAL
  Filled 2015-10-20: qty 2
  Filled 2015-10-20: qty 4
  Filled 2015-10-20 (×4): qty 2

## 2015-10-20 MED ORDER — PREDNISONE 20 MG PO TABS
40.0000 mg | ORAL_TABLET | Freq: Every day | ORAL | Status: AC
  Administered 2015-10-21 – 2015-10-23 (×3): 40 mg via ORAL
  Filled 2015-10-20 (×3): qty 2

## 2015-10-20 MED ORDER — POTASSIUM CHLORIDE 10 MEQ/100ML IV SOLN
10.0000 meq | INTRAVENOUS | Status: AC
  Administered 2015-10-20 (×2): 10 meq via INTRAVENOUS
  Filled 2015-10-20 (×2): qty 100

## 2015-10-20 MED ORDER — INSULIN ASPART 100 UNIT/ML ~~LOC~~ SOLN
0.0000 [IU] | Freq: Four times a day (QID) | SUBCUTANEOUS | Status: DC
Start: 2015-10-20 — End: 2015-10-25
  Administered 2015-10-20: 1 [IU] via SUBCUTANEOUS
  Administered 2015-10-20: 2 [IU] via SUBCUTANEOUS
  Administered 2015-10-21: 3 [IU] via SUBCUTANEOUS
  Administered 2015-10-21 – 2015-10-25 (×5): 1 [IU] via SUBCUTANEOUS

## 2015-10-20 MED ORDER — DEXTROSE 5 % IV SOLN
2.0000 g | Freq: Two times a day (BID) | INTRAVENOUS | Status: DC
  Administered 2015-10-20 – 2015-10-25 (×11): 2 g via INTRAVENOUS
  Filled 2015-10-20 (×13): qty 2

## 2015-10-20 MED ORDER — INSULIN ASPART 100 UNIT/ML ~~LOC~~ SOLN
0.0000 [IU] | SUBCUTANEOUS | Status: DC
  Administered 2015-10-20: 1 [IU] via SUBCUTANEOUS
  Administered 2015-10-20: 3 [IU] via SUBCUTANEOUS

## 2015-10-20 MED ORDER — K PHOS MONO-SOD PHOS DI & MONO 155-852-130 MG PO TABS
500.0000 mg | ORAL_TABLET | Freq: Two times a day (BID) | ORAL | Status: DC
  Administered 2015-10-20 – 2015-10-25 (×10): 500 mg via ORAL
  Filled 2015-10-20 (×14): qty 2

## 2015-10-20 MED ORDER — CETYLPYRIDINIUM CHLORIDE 0.05 % MT LIQD
7.0000 mL | Freq: Two times a day (BID) | OROMUCOSAL | Status: DC
  Administered 2015-10-20 – 2015-10-25 (×10): 7 mL via OROMUCOSAL

## 2015-10-20 MED ORDER — INSULIN ASPART 100 UNIT/ML ~~LOC~~ SOLN: 0.0000 [IU] | SUBCUTANEOUS | Status: DC

## 2015-10-20 NOTE — Progress Notes (Signed)
DR DAVID CALLED CONCERNING CBG 245. ORDERS RECEIVED TO START SSI COVERAGTE W/ CBG'S Q6HR.

## 2015-10-20 NOTE — Procedures (Signed)
**Note De-Identified Shaunta Oncale Obfuscation** Extubation Procedure Note  Patient Details:   Name: [name] DOB: 04/03/1945 MRN: [id number]   Airway Documentation:     Evaluation  O2 sats: stable throughout Complications: No apparent complications Patient did tolerate procedure well. Bilateral Breath Sounds: Rhonchi   Yes  + leak, no stridor noted. Parameters WNL  [name], Penni Bombard 10/20/2015, 10:24 AM

## 2015-10-20 NOTE — Care Management Important Message (Signed)
Important Message  Patient Details  Name: [name] MRN: [id number] Date of Birth: 1945/03/26   Medicare Important Message Given:  Yes    Sherald Barge, RN 10/20/2015, 8:41 AM

## 2015-10-20 NOTE — Progress Notes (Signed)
Subjective: She is overall about the same. She is still on the ventilator. She seems to be doing better with weaning this morning.  Objective: Vital signs in last 24 hours: Temp:  [96.9 F (36.1 C)-98.6 F (37 C)] 96.9 F (36.1 C) (08/04 0400) Pulse Rate:  [32-197] 88 (08/04 0745) Resp:  [7-27] 19 (08/04 0745) BP: (85-176)/(48-128) 142/71 (08/04 0745) SpO2:  [90 %-100 %] 100 % (08/04 0745) FiO2 (%):  [35 %] 35 % (08/04 0726) Weight:  [41.9 kg (92 lb 6 oz)-44.5 kg (98 lb 1.7 oz)] 41.9 kg (92 lb 6 oz) (08/04 0530) Weight change: 1.4 kg (3 lb 1.4 oz) Last BM Date: 10/18/15  Intake/Output from previous day: 08/03 0701 - 08/04 0700 In: 1622 [I.V.:250; NG/GT:722; IV Piggyback:650] Out: 1152 [Urine:1150; Stool:2]  PHYSICAL EXAM General appearance: Intubated sedated on the ventilator but undergoing wakeup assessment and able to respond Resp: rhonchi bilaterally Cardio: regular rate and rhythm, S1, S2 normal, no murmur, click, rub or gallop GI: soft, non-tender; bowel sounds normal; no masses,  no organomegaly Extremities: extremities normal, atraumatic, no cyanosis or edema  Lab Results:  Results for orders placed or performed during the hospital encounter of 10/15/15 (from the past 48 hour(s))  Procalcitonin     Status: None   Collection Time: 10/19/15  4:20 AM  Result Value Ref Range   Procalcitonin 14.81 ng/mL    Comment:        Interpretation: PCT >= 10 ng/mL: Important systemic inflammatory response, almost exclusively due to severe bacterial sepsis or septic shock. (NOTE)         ICU PCT Algorithm               Non ICU PCT Algorithm    ----------------------------     ------------------------------         PCT < 0.25 ng/mL                 PCT < 0.1 ng/mL     Stopping of antibiotics            Stopping of antibiotics       strongly encouraged.               strongly encouraged.    ----------------------------     ------------------------------       PCT level decrease  by               PCT < 0.25 ng/mL       >= 80% from peak PCT       OR PCT 0.25 - 0.5 ng/mL          Stopping of antibiotics                                             encouraged.     Stopping of antibiotics           encouraged.    ----------------------------     ------------------------------       PCT level decrease by              PCT >= 0.25 ng/mL       < 80% from peak PCT        AND PCT >= 0.5 ng/mL             Continuing antibiotics  encouraged.       Continuing antibiotics            encouraged.    ----------------------------     ------------------------------     PCT level increase compared          PCT > 0.5 ng/mL         with peak PCT AND          PCT >= 0.5 ng/mL             Escalation of antibiotics                                          strongly encouraged.      Escalation of antibiotics        strongly encouraged.   Basic metabolic panel     Status: Abnormal   Collection Time: 10/19/15  4:20 AM  Result Value Ref Range   Sodium 137 135 - 145 mmol/L   Potassium 3.1 (L) 3.5 - 5.1 mmol/L   Chloride 98 (L) 101 - 111 mmol/L   CO2 35 (H) 22 - 32 mmol/L   Glucose, Bld 144 (H) 65 - 99 mg/dL   BUN 36 (H) 6 - 20 mg/dL   Creatinine, Ser 0.89 0.44 - 1.00 mg/dL   Calcium 9.0 8.9 - 10.3 mg/dL   GFR calc non Af Amer >60 >60 mL/min   GFR calc Af Amer >60 >60 mL/min    Comment: (NOTE) The eGFR has been calculated using the CKD EPI equation. This calculation has not been validated in all clinical situations. eGFR's persistently <60 mL/min signify possible Chronic Kidney Disease.    Anion gap 4 (L) 5 - 15  Magnesium     Status: Abnormal   Collection Time: 10/19/15  4:20 AM  Result Value Ref Range   Magnesium 1.4 (L) 1.7 - 2.4 mg/dL  Blood gas, arterial     Status: Abnormal   Collection Time: 10/19/15  4:31 AM  Result Value Ref Range   FIO2 35.00    Delivery systems VENTILATOR    Mode PRESSURE REGULATED VOLUME CONTROL    VT  350 mL   LHR 10.0 resp/min   Peep/cpap 5.0 cm H20   pH, Arterial 7.301 (L) 7.350 - 7.450   pCO2 arterial 67.5 (HH) 35.0 - 45.0 mmHg    Comment: CRITICAL RESULT CALLED TO, READ BACK BY AND VERIFIED WITH: CINDY PHILLIPS,RN BY K KNICK RRT,RCP ON 10/19/2015 AT 0440    pO2, Arterial 105.0 (H) 80.0 - 100.0 mmHg   Bicarbonate 28.4 (H) 20.0 - 24.0 mEq/L   TCO2 16.5 0 - 100 mmol/L   Acid-Base Excess 6.2 (H) 0.0 - 2.0 mmol/L   O2 Saturation 96.6 %   Collection site RIGHT RADIAL    Drawn by [id number]    Sample type ARTERIAL    Allens test (pass/fail) PASS PASS  Magnesium     Status: None   Collection Time: 10/19/15  8:46 AM  Result Value Ref Range   Magnesium 1.7 1.7 - 2.4 mg/dL  Phosphorus     Status: None   Collection Time: 10/19/15  8:46 AM  Result Value Ref Range   Phosphorus 2.9 2.5 - 4.6 mg/dL  Glucose, capillary     Status: Abnormal   Collection Time: 10/19/15 11:26 AM  Result Value Ref Range   Glucose-Capillary 147 (H) 65 - 99 mg/dL  Comment 1 Notify RN    Comment 2 Document in Chart   Glucose, capillary     Status: Abnormal   Collection Time: 10/19/15  4:01 PM  Result Value Ref Range   Glucose-Capillary 161 (H) 65 - 99 mg/dL   Comment 1 Notify RN    Comment 2 Document in Chart   Magnesium     Status: None   Collection Time: 10/19/15  4:52 PM  Result Value Ref Range   Magnesium 2.3 1.7 - 2.4 mg/dL  Phosphorus     Status: None   Collection Time: 10/19/15  4:52 PM  Result Value Ref Range   Phosphorus 3.2 2.5 - 4.6 mg/dL  Glucose, capillary     Status: Abnormal   Collection Time: 10/19/15  8:39 PM  Result Value Ref Range   Glucose-Capillary 192 (H) 65 - 99 mg/dL   Comment 1 Notify RN    Comment 2 Document in Chart   Glucose, capillary     Status: Abnormal   Collection Time: 10/20/15 12:00 AM  Result Value Ref Range   Glucose-Capillary 258 (H) 65 - 99 mg/dL   Comment 1 Notify RN    Comment 2 Document in Chart   Basic metabolic panel     Status: Abnormal   Collection  Time: 10/20/15  4:37 AM  Result Value Ref Range   Sodium 138 135 - 145 mmol/L   Potassium 3.0 (L) 3.5 - 5.1 mmol/L   Chloride 104 101 - 111 mmol/L   CO2 32 22 - 32 mmol/L   Glucose, Bld 121 (H) 65 - 99 mg/dL   BUN 40 (H) 6 - 20 mg/dL   Creatinine, Ser 0.76 0.44 - 1.00 mg/dL   Calcium 9.6 8.9 - 10.3 mg/dL   GFR calc non Af Amer >60 >60 mL/min   GFR calc Af Amer >60 >60 mL/min    Comment: (NOTE) The eGFR has been calculated using the CKD EPI equation. This calculation has not been validated in all clinical situations. eGFR's persistently <60 mL/min signify possible Chronic Kidney Disease.   CBC     Status: Abnormal   Collection Time: 10/20/15  4:37 AM  Result Value Ref Range   WBC 12.3 (H) 4.0 - 10.5 K/uL   RBC 3.04 (L) 3.87 - 5.11 MIL/uL   Hemoglobin 9.5 (L) 12.0 - 15.0 g/dL   HCT 29.9 (L) 36.0 - 46.0 %   MCV 98.4 78.0 - 100.0 fL   MCH 31.3 26.0 - 34.0 pg   MCHC 31.8 30.0 - 36.0 g/dL   RDW 13.5 11.5 - 15.5 %   Platelets 257 150 - 400 K/uL  Magnesium     Status: None   Collection Time: 10/20/15  4:37 AM  Result Value Ref Range   Magnesium 1.9 1.7 - 2.4 mg/dL  Phosphorus     Status: Abnormal   Collection Time: 10/20/15  4:37 AM  Result Value Ref Range   Phosphorus 2.3 (L) 2.5 - 4.6 mg/dL  Blood gas, arterial     Status: Abnormal   Collection Time: 10/20/15  4:50 AM  Result Value Ref Range   FIO2 35.00    Delivery systems VENTILATOR    Mode PRESSURE REGULATED VOLUME CONTROL    VT 350 mL   LHR 14 resp/min   Peep/cpap 5.0 cm H20   pH, Arterial 7.410 7.350 - 7.450   pCO2 arterial 48.6 (H) 35.0 - 45.0 mmHg   pO2, Arterial 150.00 (H) 80.0 - 100.0 mmHg   Bicarbonate  29.3 (H) 20.0 - 24.0 mEq/L   TCO2 13.0 0 - 100 mmol/L   Acid-Base Excess 5.7 (H) 0.0 - 2.0 mmol/L   O2 Saturation 99.0 %   Collection site RIGHT BRACHIAL    Drawn by [id number]    Sample type ARTERIAL    Allens test (pass/fail) NOT INDICATED (A) PASS  Glucose, capillary     Status: Abnormal   Collection Time:  10/20/15  5:24 AM  Result Value Ref Range   Glucose-Capillary 143 (H) 65 - 99 mg/dL   Comment 1 Notify RN    Comment 2 Document in Chart     ABGS  Recent Labs  10/20/15 0450  PHART 7.410  PO2ART 150.00*  TCO2 13.0  HCO3 29.3*   CULTURES Recent Results (from the past 240 hour(s))  MRSA PCR Screening     Status: None   Collection Time: 10/15/15  9:54 PM  Result Value Ref Range Status   MRSA by PCR NEGATIVE NEGATIVE Final    Comment:        The GeneXpert MRSA Assay (FDA approved for NASAL specimens only), is one component of a comprehensive MRSA colonization surveillance program. It is not intended to diagnose MRSA infection nor to guide or monitor treatment for MRSA infections.   Culture, expectorated sputum-assessment     Status: None   Collection Time: 10/16/15  1:43 PM  Result Value Ref Range Status   Specimen Description TRACHEAL ASPIRATE  Final   Special Requests NONE  Final   Sputum evaluation   Final    THIS SPECIMEN IS ACCEPTABLE. RESPIRATORY CULTURE REPORT TO FOLLOW. Performed at [facility]    Report Status 10/16/2015 FINAL  Final  Culture, respiratory (NON-Expectorated)     Status: None (Preliminary result)   Collection Time: 10/16/15  1:43 PM  Result Value Ref Range Status   Specimen Description TRACHEAL ASPIRATE  Final   Special Requests NONE  Final   Gram Stain   Final    ABUNDANT WBC PRESENT,BOTH PMN AND MONONUCLEAR NO ORGANISMS SEEN    Culture   Final    MODERATE PSEUDOMONAS AERUGINOSA SUSCEPTIBILITIES TO FOLLOW Performed at [facility]    Report Status PENDING  Incomplete   Studies/Results: No results found.  Medications:  Prior to Admission:  Prescriptions Prior to Admission  Medication Sig Dispense Refill Last Dose  . adalimumab (HUMIRA PEN) 40 MG/0.8ML injection Inject 40 mg into the skin every 14 (fourteen) days.    Past Month at Unknown time  . albuterol (PROAIR HFA) 108 (90 BASE) MCG/ACT inhaler INHALE 2 PUFFS  INTO THE LUNGS EVERY 6 HOURS AS NEEDED FOR WHEEZING OR SHORTNESS OF BREATH 1 Inhaler 2 Past Week at Unknown time  . albuterol (PROVENTIL) (2.5 MG/3ML) 0.083% nebulizer solution USE ONE VIAL IN NEBULIZER EVERY 6 HOURS AS NEEDED FOR WHEEZING AND FOR SHORTNESS OF BREATH 1080 mL 2 Past Week at Unknown time  . ALPRAZolam (XANAX) 0.5 MG tablet Take 1 tablet (0.5 mg total) by mouth 2 (two) times daily. 180 tablet 0 Past Week at Unknown time  . amitriptyline (ELAVIL) 50 MG tablet Take 1 tablet by mouth at  bedtime 90 tablet 1 Past Week at Unknown time  . aspirin 325 MG tablet Take 325 mg by mouth daily.     Past Week at Unknown time  . buPROPion (WELLBUTRIN XL) 150 MG 24 hr tablet Take 1 tablet by mouth  daily 90 tablet 0 Past Week at Unknown time  . cetirizine (ALL DAY  ALLERGY) 10 MG tablet Take 10 mg by mouth daily.   Past Week at Unknown time  . cholecalciferol (VITAMIN D) 1000 UNITS tablet Take 1,000 Units by mouth daily.   Past Week at Unknown time  . EQL CALCIUM CITRATE/VITAMIN D PO Take by mouth. Vitamin D 500IU with Calcium Citrate '630mg'$    Past Week at Unknown time  . Fluticasone Furoate-Vilanterol (BREO ELLIPTA) 100-25 MCG/INH AEPB Inhale 1 puff into the lungs daily.    Past Week at Unknown time  . furosemide (LASIX) 20 MG tablet Take 1 tablet by mouth  daily 90 tablet 0 Past Week at Unknown time  . guaifenesin (MUCUS RELIEF) 400 MG TABS tablet Take 400 mg by mouth daily as needed (for ocngestion).   unknown  . HYDROcodone-acetaminophen (NORCO) 10-325 MG tablet Take 1 tablet by mouth every 8 (eight) hours as needed. (Patient taking differently: Take 1 tablet by mouth every 8 (eight) hours as needed for moderate pain. ) 60 tablet 0 Past Week at Unknown time  . metoprolol (LOPRESSOR) 50 MG tablet Take 1 tablet by mouth two  times daily 180 tablet 0 Past Week at Unknown time  . tiotropium (SPIRIVA) 18 MCG inhalation capsule Place 1 capsule (18 mcg total) into inhaler and inhale every morning. 90 capsule  0 Past Week at Unknown time   Scheduled: . amitriptyline  50 mg Oral QHS  . antiseptic oral rinse  7 mL Mouth Rinse QID  . aspirin  325 mg Oral Daily  . azithromycin  500 mg Intravenous Q24H  . buPROPion  150 mg Oral Daily  . cefTRIAXone (ROCEPHIN)  IV  1 g Intravenous Q24H  . chlorhexidine gluconate (SAGE KIT)  15 mL Mouth Rinse BID  . cholecalciferol  1,000 Units Oral Daily  . enoxaparin (LOVENOX) injection  40 mg Subcutaneous Q24H  . famotidine (PEPCID) IV  20 mg Intravenous Q12H  . feeding supplement (VITAL HIGH PROTEIN)  1,000 mL Per Tube Q24H  . fluticasone furoate-vilanterol  1 puff Inhalation Daily  . insulin aspart  0-9 Units Subcutaneous Q6H  . ipratropium  0.5 mg Nebulization Q6H  . levalbuterol  0.63 mg Nebulization Q6H  . loratadine  10 mg Oral Daily  . methylPREDNISolone (SOLU-MEDROL) injection  60 mg Intravenous Q6H  . metoprolol  50 mg Oral BID   Continuous: . fentaNYL infusion INTRAVENOUS 50 mcg/hr (10/20/15 0700)  . propofol (DIPRIVAN) infusion 20 mcg/kg/min (10/20/15 0700)   YTK:PTWSFKCL (SUBLIMAZE) injection, fentaNYL, guaiFENesin, HYDROcodone-acetaminophen, levalbuterol, LORazepam  Assesment: She was admitted with acute on chronic hypoxic and hypercapnic respiratory failure with community-acquired pneumonia and COPD exacerbation. She had very high PCO2 and was unresponsive and was intubated for airway protection and for treatment of respiratory failure. She has failed weaning so far. She developed post-hypercapnic metabolic alkalosis which is been treated with Diamox. She is slowly improving.  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

## 2015-10-20 NOTE — Progress Notes (Signed)
[facility] PROGRESS NOTE  [name] KVQ:[id number] DOB: 22-Dec-1945    PCP:   Chevis Pretty, FNP   HPI:   70 year old woman admitted on 7/30 from home with complaints of shortness of breath and cough. She was found to have community-acquired pneumonia. She does have a history of hypoxic/hypercarbic chronic respiratory failure due to COPD. was intubated on 7/31 due to unresponsiveness and a PCO2 of 92.  She was given IV Diamox, with improvement of her met alkalosis,  and has some improvement.  Dr Standley Dakins was consulted, and help with extubation, and she was subsequently sucessfully extubated on October 20, 2015.  Her sputum came back with pseudomonas pan sensitive, and her Rocephin/Zithromax was changed to IV Fortaz and IV Zithromax.  No other events subsequently, and her NGT was pulled, and she will be given oral food.    Rewiew of Systems: Unable prior, due to intubation.   Past Medical History:  Diagnosis Date  . Anxiety disorder   . Atrial fibrillation ([place])   . Avascular necrosis of hip ([place])    left  . CHF (congestive heart failure) ([place])   . COPD (chronic obstructive pulmonary disease) ([place])   . ETOH abuse   . HTN (hypertension)   . Hyperglycemia   . On home O2    3L N/C  . Psoriasis     Past Surgical History:  Procedure Laterality Date  . JOINT REPLACEMENT    . TONSILLECTOMY    . TOTAL HIP ARTHROPLASTY  05-15-08   r hip    Medications:  HOME MEDS: Prior to Admission medications   Medication Sig Start Date End Date Taking? Authorizing Provider  adalimumab (HUMIRA PEN) 40 MG/0.8ML injection Inject 40 mg into the skin every 14 (fourteen) days.    Yes Historical Provider, MD  albuterol (PROAIR HFA) 108 (90 BASE) MCG/ACT inhaler INHALE 2 PUFFS INTO THE LUNGS EVERY 6 HOURS AS NEEDED FOR WHEEZING OR SHORTNESS OF BREATH 03/07/15  Yes Mary-Margaret Hassell Done, FNP  albuterol (PROVENTIL) (2.5 MG/3ML) 0.083% nebulizer solution USE ONE VIAL IN NEBULIZER EVERY 6 HOURS  AS NEEDED FOR WHEEZING AND FOR SHORTNESS OF BREATH 12/01/14  Yes Mary-Margaret Hassell Done, FNP  ALPRAZolam Duanne Moron) 0.5 MG tablet Take 1 tablet (0.5 mg total) by mouth 2 (two) times daily. 07/17/15  Yes Mary-Margaret Hassell Done, FNP  amitriptyline (ELAVIL) 50 MG tablet Take 1 tablet by mouth at  bedtime 12/23/14  Yes Mary-Margaret Hassell Done, FNP  aspirin 325 MG tablet Take 325 mg by mouth daily.     Yes Historical Provider, MD  buPROPion (WELLBUTRIN XL) 150 MG 24 hr tablet Take 1 tablet by mouth  daily 10/04/15  Yes Mary-Margaret Hassell Done, FNP  cetirizine (ALL DAY ALLERGY) 10 MG tablet Take 10 mg by mouth daily.   Yes Historical Provider, MD  cholecalciferol (VITAMIN D) 1000 UNITS tablet Take 1,000 Units by mouth daily.   Yes Historical Provider, MD  EQL CALCIUM CITRATE/VITAMIN D PO Take by mouth. Vitamin D 500IU with Calcium Citrate '630mg'$    Yes Historical Provider, MD  Fluticasone Furoate-Vilanterol (BREO ELLIPTA) 100-25 MCG/INH AEPB Inhale 1 puff into the lungs daily.    Yes Historical Provider, MD  furosemide (LASIX) 20 MG tablet Take 1 tablet by mouth  daily 10/04/15  Yes Mary-Margaret Hassell Done, FNP  guaifenesin (MUCUS RELIEF) 400 MG TABS tablet Take 400 mg by mouth daily as needed (for ocngestion).   Yes Historical Provider, MD  HYDROcodone-acetaminophen (NORCO) 10-325 MG tablet Take 1 tablet by mouth every 8 (eight) hours as  needed. Patient taking differently: Take 1 tablet by mouth every 8 (eight) hours as needed for moderate pain.  05/25/15  Yes Mary-Margaret Daphine Deutscher, FNP  metoprolol (LOPRESSOR) 50 MG tablet Take 1 tablet by mouth two  times daily 10/04/15  Yes Mary-Margaret Daphine Deutscher, FNP  tiotropium (SPIRIVA) 18 MCG inhalation capsule Place 1 capsule (18 mcg total) into inhaler and inhale every morning. 06/26/15  Yes Mary-Margaret Daphine Deutscher, FNP     Allergies:  Allergies  Allergen Reactions  . Celexa [Citalopram Hydrobromide]     Jerky movements  . Nickel   . Tramadol Hcl     REACTION: nausea and vomiting     Social History:   reports that she quit smoking about 6 years ago. She has a 35.00 pack-year smoking history. She has never used smokeless tobacco. She reports that she drinks alcohol. She reports that she does not use drugs.  Family History: Family History  Problem Relation Age of Onset  . Heart disease Father   . Hyperlipidemia Father   . Hypertension Father   . Heart attack Father   . Cancer Paternal Grandfather   . Alzheimer's disease Mother   . Diabetes Sister   . Hyperlipidemia Sister   . Hypertension Sister   . Heart attack Sister   . Peripheral vascular disease Sister   . Heart disease Brother   . Hyperlipidemia Brother   . Heart attack Brother      Physical Exam: Vitals:   10/20/15 1200 10/20/15 1300 10/20/15 1400 10/20/15 1403  BP: (!) 159/72 (!) 160/71 (!) 169/87   Pulse: 65 79 (!) 104   Resp: (!) 23 11 15    Temp:      TempSrc:      SpO2: 100% 97% 100% 100%  Weight:      Height:       Blood pressure (!) 169/87, pulse (!) 104, temperature 97.3 F (36.3 C), temperature source Axillary, resp. rate 15, height 5' (1.524 m), weight 41.9 kg (92 lb 6 oz), SpO2 100 %.  GEN:  Pleasant  patient lying in the stretcher in no acute distress; cooperative with exam. PSYCH:  alert and oriented x4; does not appear anxious or depressed; affect is appropriate. HEENT: Mucous membranes pink and anicteric; PERRLA; EOM intact; no cervical lymphadenopathy nor thyromegaly or carotid bruit; no JVD; There were no stridor. Neck is very supple. Breasts:: Not examined CHEST WALL: No tenderness CHEST: Normal respiration, clear to auscultation bilaterally.  HEART: Regular rate and rhythm.  There are no murmur, rub, or gallops.   BACK: No kyphosis or scoliosis; no CVA tenderness ABDOMEN: soft and non-tender; no masses, no organomegaly, normal abdominal bowel sounds; no pannus; no intertriginous candida. There is no rebound and no distention. Rectal Exam: Not done EXTREMITIES: No bone  or joint deformity; age-appropriate arthropathy of the hands and knees; no edema; no ulcerations.  There is no calf tenderness. Genitalia: not examined PULSES: 2+ and symmetric SKIN: Normal hydration no rash or ulceration CNS: Cranial nerves 2-12 grossly intact no focal lateralizing neurologic deficit.  Speech is fluent; uvula elevated with phonation, facial symmetry and tongue midline. DTR are normal bilaterally, cerebella exam is intact, barbinski is negative and strengths are equaled bilaterally.  No sensory loss.   Labs on Admission:  Basic Metabolic Panel:  Recent Labs Lab 10/16/15 0433 10/17/15 0458 10/18/15 0427 10/19/15 0420 10/19/15 0846 10/19/15 1652 10/20/15 0437  NA 143 142 138 137  --   --  138  K 3.7 2.8* 3.0* 3.1*  --   --  3.0*  CL 86* 91* 94* 98*  --   --  104  CO2 50* 40* 33* 35*  --   --  32  GLUCOSE 153* 133* 160* 144*  --   --  121*  BUN 17 30* 37* 36*  --   --  40*  CREATININE 0.69 1.11* 0.99 0.89  --   --  0.76  CALCIUM 10.4* 10.5* 9.8 9.0  --   --  9.6  MG  --  1.0*  --  1.4* 1.7 2.3 1.9  PHOS  --   --   --   --  2.9 3.2 2.3*   Liver Function Tests: CBC:  Recent Labs Lab 10/15/15 1629 10/16/15 0433 10/16/15 0434 10/17/15 0458 10/18/15 0427 10/20/15 0437  WBC 6.6 5.0  --  7.5 9.9 12.3*  NEUTROABS 4.8 4.2  --   --   --   --   HGB 9.7* 9.1*  --  8.5* 9.4* 9.5*  HCT 34.6* 31.0* 30.1* 29.2* 31.1* 29.9*  MCV 105.8* 104.0*  --  104.3* 97.8 98.4  PLT 216 215  --  241 249 257   Cardiac Enzymes:  Recent Labs Lab 10/15/15 1629  TROPONINI <0.03    CBG:  Recent Labs Lab 10/19/15 2039 10/20/15 0000 10/20/15 0524 10/20/15 1124 10/20/15 1701  GLUCAP 192* 258* 143* 151* 132*   Assessment/Plan Present on Admission: . Acute and chronic respiratory failure with hypercapnia ([place]) . COPD with acute exacerbation ([place]) . HTN (hypertension) . Anxiety . RA (rheumatoid arthritis) ([place]) . Chronic diastolic CHF (congestive heart failure) ([place]) .  CAP (community acquired pneumonia) . Macrocytic anemia  PLAN:  COPD with acute on chronic respiratory failure:  She was extubated today, and her sputum grew pan sensitive pseudomonas, so antibiotics was changed to Ceftazidime and IV ZIthromax.      Metabolic alkalosis post intubation:  She was given IV diamox, and I think her pCO2 and Bicarb is at her baseline at this time.  She is doing well.   Chronic diastolic CHF: Compensated.  Hypokalemia:  Still low today.  Will continue to supplement.  Will give K phos for low phosphorous as well.    Other plans as per orders. Code Status: FULL Haskel Khan, MD.  FACP [facility] Pager [phone] 7pm to 7am.  10/20/2015, 5:21 PM

## 2015-10-20 NOTE — Progress Notes (Signed)
Pharmacy Antibiotic Note  TRISTAN KAMMERMAN is a 70 y.o. female admitted on 10/15/2015 with pneumonia.  Pharmacy has been consulted for fortaz dosing for Pseudomonas in sputum culture  Plan: D/c rocephin and start fortaz 2 gm IV q12 hours F/u renal function, cultures and clinical course  Height: 5' (152.4 cm) Weight: 92 lb 6 oz (41.9 kg) IBW/kg (Calculated) : 45.5  Temp (24hrs), Avg:97.4 F (36.3 C), Min:96.9 F (36.1 C), Max:98.6 F (37 C)   Recent Labs Lab 10/15/15 1629 10/15/15 1931 10/16/15 0433 10/17/15 0458 10/18/15 0427 10/19/15 0420 10/20/15 0437  WBC 6.6  --  5.0 7.5 9.9  --  12.3*  CREATININE 0.71  --  0.69 1.11* 0.99 0.89 0.76  LATICACIDVEN 0.9 1.1  --   --   --   --   --     Estimated Creatinine Clearance: 42.7 mL/min (by C-G formula based on SCr of 0.8 mg/dL).    Allergies  Allergen Reactions  . Celexa [Citalopram Hydrobromide]     Jerky movements  . Nickel   . Tramadol Hcl     REACTION: nausea and vomiting    Antimicrobials this admission: fortaz 8/4 >>  rocephin 7/30 >> 8/4 Azithromycin 7/30>>8/4   Microbiology results: 7/31Sputum: Pseudomonas, pan S  7/30 MRSA PCR: (-)  Thank you for allowing pharmacy to be a part of this patient's care.  Excell Seltzer P[place]et 10/20/2015 1:32 PM

## 2015-10-20 NOTE — Progress Notes (Addendum)
Wasted 50 cc Fentanyl in med room sink with Burna Sis, RN as witness.   Witnessed waste of Fentanyl in sink with [employer], Therapist, sports.

## 2015-10-21 LAB — BASIC METABOLIC PANEL
ANION GAP: 5 (ref 5–15)
BUN: 30 mg/dL — ABNORMAL HIGH (ref 6–20)
CALCIUM: 10.2 mg/dL (ref 8.9–10.3)
CHLORIDE: 102 mmol/L (ref 101–111)
CO2: 36 mmol/L — ABNORMAL HIGH (ref 22–32)
CREATININE: 0.69 mg/dL (ref 0.44–1.00)
GFR calc non Af Amer: >60 mL/min (ref >60)
Glucose, Bld: 71 mg/dL (ref 65–99)
Potassium: 3.2 mmol/L — ABNORMAL LOW (ref 3.5–5.1)
SODIUM: 143 mmol/L (ref 135–145)

## 2015-10-21 LAB — GLUCOSE, CAPILLARY
GLUCOSE-CAPILLARY: 101 mg/dL — AB (ref 65–99)
GLUCOSE-CAPILLARY: 109 mg/dL — AB (ref 65–99)
GLUCOSE-CAPILLARY: 234 mg/dL — AB (ref 65–99)
GLUCOSE-CAPILLARY: 89 mg/dL (ref 65–99)
Glucose-Capillary: 84 mg/dL (ref 65–99)

## 2015-10-21 LAB — TRIGLYCERIDES: Triglycerides: 259 mg/dL — ABNORMAL HIGH (ref <150)

## 2015-10-21 NOTE — Progress Notes (Signed)
Wasted 250cc Fentanyl in sink with Burna Sis, RN

## 2015-10-21 NOTE — Progress Notes (Signed)
Subjective: She was able to be extubated yesterday. She was admitted with community-acquired pneumonia hypoxic/hyper Respiratory failure. She is awake and alert. She is confused.  Objective: Vital signs in last 24 hours: Temp:  [97 F (36.1 C)-97.9 F (36.6 C)] 97 F (36.1 C) (08/05 0800) Pulse Rate:  [65-134] 85 (08/05 0700) Resp:  [11-23] 13 (08/05 0700) BP: (115-189)/(53-100) 139/62 (08/05 0700) SpO2:  [90 %-100 %] 100 % (08/05 0751) Weight change:  Last BM Date: 10/20/15  Intake/Output from previous day: 08/04 0701 - 08/05 0700 In: 80 [I.V.:30; IV Piggyback:50] Out: 950 [Urine:950]  PHYSICAL EXAM General appearance: alert, mild distress and Confused Resp: rhonchi bilaterally Cardio: regular rate and rhythm, S1, S2 normal, no murmur, click, rub or gallop GI: soft, non-tender; bowel sounds normal; no masses,  no organomegaly Extremities: extremities normal, atraumatic, no cyanosis or edema  Lab Results:  Results for orders placed or performed during the hospital encounter of 10/15/15 (from the past 48 hour(s))  Glucose, capillary     Status: Abnormal   Collection Time: 10/19/15 11:26 AM  Result Value Ref Range   Glucose-Capillary 147 (H) 65 - 99 mg/dL   Comment 1 Notify RN    Comment 2 Document in Chart   Glucose, capillary     Status: Abnormal   Collection Time: 10/19/15  4:01 PM  Result Value Ref Range   Glucose-Capillary 161 (H) 65 - 99 mg/dL   Comment 1 Notify RN    Comment 2 Document in Chart   Magnesium     Status: None   Collection Time: 10/19/15  4:52 PM  Result Value Ref Range   Magnesium 2.3 1.7 - 2.4 mg/dL  Phosphorus     Status: None   Collection Time: 10/19/15  4:52 PM  Result Value Ref Range   Phosphorus 3.2 2.5 - 4.6 mg/dL  Glucose, capillary     Status: Abnormal   Collection Time: 10/19/15  8:39 PM  Result Value Ref Range   Glucose-Capillary 192 (H) 65 - 99 mg/dL   Comment 1 Notify RN    Comment 2 Document in Chart   Glucose, capillary      Status: Abnormal   Collection Time: 10/20/15 12:00 AM  Result Value Ref Range   Glucose-Capillary 258 (H) 65 - 99 mg/dL   Comment 1 Notify RN    Comment 2 Document in Chart   Basic metabolic panel     Status: Abnormal   Collection Time: 10/20/15  4:37 AM  Result Value Ref Range   Sodium 138 135 - 145 mmol/L   Potassium 3.0 (L) 3.5 - 5.1 mmol/L   Chloride 104 101 - 111 mmol/L   CO2 32 22 - 32 mmol/L   Glucose, Bld 121 (H) 65 - 99 mg/dL   BUN 40 (H) 6 - 20 mg/dL   Creatinine, Ser 0.76 0.44 - 1.00 mg/dL   Calcium 9.6 8.9 - 10.3 mg/dL   GFR calc non Af Amer >60 >60 mL/min   GFR calc Af Amer >60 >60 mL/min    Comment: (NOTE) The eGFR has been calculated using the CKD EPI equation. This calculation has not been validated in all clinical situations. eGFR's persistently <60 mL/min signify possible Chronic Kidney Disease.   CBC     Status: Abnormal   Collection Time: 10/20/15  4:37 AM  Result Value Ref Range   WBC 12.3 (H) 4.0 - 10.5 K/uL   RBC 3.04 (L) 3.87 - 5.11 MIL/uL   Hemoglobin 9.5 (L) 12.0 -  15.0 g/dL   HCT 29.9 (L) 36.0 - 46.0 %   MCV 98.4 78.0 - 100.0 fL   MCH 31.3 26.0 - 34.0 pg   MCHC 31.8 30.0 - 36.0 g/dL   RDW 13.5 11.5 - 15.5 %   Platelets 257 150 - 400 K/uL  Magnesium     Status: None   Collection Time: 10/20/15  4:37 AM  Result Value Ref Range   Magnesium 1.9 1.7 - 2.4 mg/dL  Phosphorus     Status: Abnormal   Collection Time: 10/20/15  4:37 AM  Result Value Ref Range   Phosphorus 2.3 (L) 2.5 - 4.6 mg/dL  Blood gas, arterial     Status: Abnormal   Collection Time: 10/20/15  4:50 AM  Result Value Ref Range   FIO2 35.00    Delivery systems VENTILATOR    Mode PRESSURE REGULATED VOLUME CONTROL    VT 350 mL   LHR 14 resp/min   Peep/cpap 5.0 cm H20   pH, Arterial 7.410 7.350 - 7.450   pCO2 arterial 48.6 (H) 35.0 - 45.0 mmHg   pO2, Arterial 150.00 (H) 80.0 - 100.0 mmHg   Bicarbonate 29.3 (H) 20.0 - 24.0 mEq/L   TCO2 13.0 0 - 100 mmol/L   Acid-Base Excess 5.7  (H) 0.0 - 2.0 mmol/L   O2 Saturation 99.0 %   Collection site RIGHT BRACHIAL    Drawn by [id number]    Sample type ARTERIAL    Allens test (pass/fail) NOT INDICATED (A) PASS  Glucose, capillary     Status: Abnormal   Collection Time: 10/20/15  5:24 AM  Result Value Ref Range   Glucose-Capillary 143 (H) 65 - 99 mg/dL   Comment 1 Notify RN    Comment 2 Document in Chart   Glucose, capillary     Status: Abnormal   Collection Time: 10/20/15 11:24 AM  Result Value Ref Range   Glucose-Capillary 151 (H) 65 - 99 mg/dL   Comment 1 Notify RN    Comment 2 Document in Chart   Glucose, capillary     Status: Abnormal   Collection Time: 10/20/15  5:01 PM  Result Value Ref Range   Glucose-Capillary 132 (H) 65 - 99 mg/dL   Comment 1 Notify RN   Magnesium     Status: None   Collection Time: 10/20/15  5:07 PM  Result Value Ref Range   Magnesium 1.7 1.7 - 2.4 mg/dL  Phosphorus     Status: None   Collection Time: 10/20/15  5:07 PM  Result Value Ref Range   Phosphorus 2.6 2.5 - 4.6 mg/dL  Glucose, capillary     Status: Abnormal   Collection Time: 10/21/15 12:23 AM  Result Value Ref Range   Glucose-Capillary 234 (H) 65 - 99 mg/dL   Comment 1 Notify RN    Comment 2 Document in Chart   Triglycerides     Status: Abnormal   Collection Time: 10/21/15  5:28 AM  Result Value Ref Range   Triglycerides 259 (H) <150 mg/dL  Basic metabolic panel     Status: Abnormal   Collection Time: 10/21/15  5:28 AM  Result Value Ref Range   Sodium 143 135 - 145 mmol/L   Potassium 3.2 (L) 3.5 - 5.1 mmol/L   Chloride 102 101 - 111 mmol/L   CO2 36 (H) 22 - 32 mmol/L   Glucose, Bld 71 65 - 99 mg/dL   BUN 30 (H) 6 - 20 mg/dL   Creatinine, Ser 0.69 0.44 -  1.00 mg/dL   Calcium 10.2 8.9 - 10.3 mg/dL   GFR calc non Af Amer >60 >60 mL/min   GFR calc Af Amer >60 >60 mL/min    Comment: (NOTE) The eGFR has been calculated using the CKD EPI equation. This calculation has not been validated in all clinical situations. eGFR's  persistently <60 mL/min signify possible Chronic Kidney Disease.    Anion gap 5 5 - 15  Glucose, capillary     Status: None   Collection Time: 10/21/15  6:30 AM  Result Value Ref Range   Glucose-Capillary 84 65 - 99 mg/dL    ABGS  Recent Labs  10/20/15 0450  PHART 7.410  PO2ART 150.00*  TCO2 13.0  HCO3 29.3*   CULTURES Recent Results (from the past 240 hour(s))  MRSA PCR Screening     Status: None   Collection Time: 10/15/15  9:54 PM  Result Value Ref Range Status   MRSA by PCR NEGATIVE NEGATIVE Final    Comment:        The GeneXpert MRSA Assay (FDA approved for NASAL specimens only), is one component of a comprehensive MRSA colonization surveillance program. It is not intended to diagnose MRSA infection nor to guide or monitor treatment for MRSA infections.   Culture, expectorated sputum-assessment     Status: None   Collection Time: 10/16/15  1:43 PM  Result Value Ref Range Status   Specimen Description TRACHEAL ASPIRATE  Final   Special Requests NONE  Final   Sputum evaluation   Final    THIS SPECIMEN IS ACCEPTABLE. RESPIRATORY CULTURE REPORT TO FOLLOW. Performed at [facility]    Report Status 10/16/2015 FINAL  Final  Culture, respiratory (NON-Expectorated)     Status: None   Collection Time: 10/16/15  1:43 PM  Result Value Ref Range Status   Specimen Description TRACHEAL ASPIRATE  Final   Special Requests NONE  Final   Gram Stain   Final    ABUNDANT WBC PRESENT,BOTH PMN AND MONONUCLEAR NO ORGANISMS SEEN Performed at [place]  Final   Report Status 10/20/2015 FINAL  Final   Organism ID, Bacteria PSEUDOMONAS AERUGINOSA  Final      Susceptibility   Pseudomonas aeruginosa - MIC*    CEFTAZIDIME 2 SENSITIVE Sensitive     CIPROFLOXACIN <=0.25 SENSITIVE Sensitive     GENTAMICIN <=1 SENSITIVE Sensitive     IMIPENEM 2 SENSITIVE Sensitive     PIP/TAZO <=4 SENSITIVE Sensitive     CEFEPIME <=1  SENSITIVE Sensitive     * MODERATE PSEUDOMONAS AERUGINOSA   Studies/Results: No results found.  Medications:  Prior to Admission:  Prescriptions Prior to Admission  Medication Sig Dispense Refill Last Dose  . adalimumab (HUMIRA PEN) 40 MG/0.8ML injection Inject 40 mg into the skin every 14 (fourteen) days.    Past Month at Unknown time  . albuterol (PROAIR HFA) 108 (90 BASE) MCG/ACT inhaler INHALE 2 PUFFS INTO THE LUNGS EVERY 6 HOURS AS NEEDED FOR WHEEZING OR SHORTNESS OF BREATH 1 Inhaler 2 Past Week at Unknown time  . albuterol (PROVENTIL) (2.5 MG/3ML) 0.083% nebulizer solution USE ONE VIAL IN NEBULIZER EVERY 6 HOURS AS NEEDED FOR WHEEZING AND FOR SHORTNESS OF BREATH 1080 mL 2 Past Week at Unknown time  . ALPRAZolam (XANAX) 0.5 MG tablet Take 1 tablet (0.5 mg total) by mouth 2 (two) times daily. 180 tablet 0 Past Week at Unknown time  . amitriptyline (ELAVIL) 50 MG tablet Take 1  tablet by mouth at  bedtime 90 tablet 1 Past Week at Unknown time  . aspirin 325 MG tablet Take 325 mg by mouth daily.     Past Week at Unknown time  . buPROPion (WELLBUTRIN XL) 150 MG 24 hr tablet Take 1 tablet by mouth  daily 90 tablet 0 Past Week at Unknown time  . cetirizine (ALL DAY ALLERGY) 10 MG tablet Take 10 mg by mouth daily.   Past Week at Unknown time  . cholecalciferol (VITAMIN D) 1000 UNITS tablet Take 1,000 Units by mouth daily.   Past Week at Unknown time  . EQL CALCIUM CITRATE/VITAMIN D PO Take by mouth. Vitamin D 500IU with Calcium Citrate 639m   Past Week at Unknown time  . Fluticasone Furoate-Vilanterol (BREO ELLIPTA) 100-25 MCG/INH AEPB Inhale 1 puff into the lungs daily.    Past Week at Unknown time  . furosemide (LASIX) 20 MG tablet Take 1 tablet by mouth  daily 90 tablet 0 Past Week at Unknown time  . guaifenesin (MUCUS RELIEF) 400 MG TABS tablet Take 400 mg by mouth daily as needed (for ocngestion).   unknown  . HYDROcodone-acetaminophen (NORCO) 10-325 MG tablet Take 1 tablet by mouth every 8  (eight) hours as needed. (Patient taking differently: Take 1 tablet by mouth every 8 (eight) hours as needed for moderate pain. ) 60 tablet 0 Past Week at Unknown time  . metoprolol (LOPRESSOR) 50 MG tablet Take 1 tablet by mouth two  times daily 180 tablet 0 Past Week at Unknown time  . tiotropium (SPIRIVA) 18 MCG inhalation capsule Place 1 capsule (18 mcg total) into inhaler and inhale every morning. 90 capsule 0 Past Week at Unknown time   Scheduled: . amitriptyline  50 mg Oral QHS  . antiseptic oral rinse  7 mL Mouth Rinse BID  . aspirin  325 mg Oral Daily  . azithromycin  500 mg Intravenous Q24H  . buPROPion  150 mg Oral Daily  . cefTAZidime (FORTAZ)  IV  2 g Intravenous Q12H  . cholecalciferol  1,000 Units Oral Daily  . enoxaparin (LOVENOX) injection  40 mg Subcutaneous Q24H  . famotidine (PEPCID) IV  20 mg Intravenous Q12H  . feeding supplement (VITAL HIGH PROTEIN)  1,000 mL Per Tube Q24H  . fluticasone furoate-vilanterol  1 puff Inhalation Daily  . insulin aspart  0-9 Units Subcutaneous Q6H  . ipratropium  0.5 mg Nebulization Q6H  . levalbuterol  0.63 mg Nebulization Q6H  . loratadine  10 mg Oral Daily  . metoprolol  50 mg Oral BID  . phosphorus  500 mg Oral BID  . potassium chloride  40 mEq Oral Daily  . predniSONE  40 mg Oral QAC breakfast   Continuous: . fentaNYL infusion INTRAVENOUS Stopped (10/20/15 1000)   POIN:OMVEHMCN(SUBLIMAZE) injection, fentaNYL, guaiFENesin, HYDROcodone-acetaminophen, levalbuterol, LORazepam  Assesment: She was admitted with community-acquired pneumonia. She had acute on chronic hypoxic and hypercapnic respiratory failure. She required intubation and mechanical ventilation beginning on 10/16/2015 and she was extubated yesterday. She is confused. She has a inflammatory arthritis on a monoclonal antibody treatment so she is somewhat immunocompromised. She has severe protein calorie malnutrition and had been on tube feedings while intubated. Principal  Problem:   CAP (community acquired pneumonia) Active Problems:   HTN (hypertension)   RA (rheumatoid arthritis) (HCC)   Acute and chronic respiratory failure with hypercapnia (HCC)   COPD with acute exacerbation (HCC)   Anxiety   Chronic diastolic CHF (congestive heart failure) ([place]  Macrocytic anemia   Acute respiratory failure with hypoxia and hypercapnia (HCC)   Protein-calorie malnutrition, severe    Plan: Continue current treatments.    LOS: 6 days   Elise Knobloch L 10/21/2015, 9:50 AM

## 2015-10-21 NOTE — Progress Notes (Signed)
[facility] PROGRESS NOTE  [name] PNT:[id number] DOB: December 31, 1945    PCP:   Chevis Pretty, FNP   HPI:  70 year old woman admitted on 7/30 from home with complaints of shortness of breath and cough. She was found to have community-acquired pneumonia. She does have a history of hypoxic/hypercarbic chronic respiratory failure due to COPD. was intubated on 7/31 due to unresponsiveness and a PCO2 of 92.She was given IV Diamox, with improvement of her met alkalosisDr Hawkin was consulted, and helped with extubation, and she was subsequently sucessfully extubated on October 20, 2015.  Her sputum came back with pseudomonas pan sensitive, and her Rocephin/Zithromax was changed to IV Fortaz and IV Zithromax.  No other events subsequently, and her NGT was pulled, and she was started on a diet.   Rewiew of Systems:  Constitutional: Negative for malaise, fever and chills. No significant weight loss or weight gain Eyes: Negative for eye pain, redness and discharge, diplopia, visual changes, or flashes of light. ENMT: Negative for ear pain, hoarseness, nasal congestion, sinus pressure and sore throat. No headaches; tinnitus, drooling, or problem swallowing. Cardiovascular: Negative for chest pain, palpitations, diaphoresis, dyspnea and peripheral edema. ; No orthopnea, PND Respiratory: Negative for cough, hemoptysis, wheezing and stridor. No pleuritic chestpain. Gastrointestinal: Negative for nausea, vomiting, diarrhea, constipation, abdominal pain, melena, blood in stool, hematemesis, jaundice and rectal bleeding.    Genitourinary: Negative for frequency, dysuria, incontinence,flank pain and hematuria; Musculoskeletal: Negative for back pain and neck pain. Negative for swelling and trauma.;  Skin: . Negative for pruritus, rash, abrasions, bruising and skin lesion.; ulcerations Neuro: Negative for headache, lightheadedness and neck stiffness. Negative for weakness, altered level of  consciousness , altered mental status, extremity weakness, burning feet, involuntary movement, seizure and syncope.  Psych: negative for anxiety, depression, insomnia, tearfulness, panic attacks, hallucinations, paranoia, suicidal or homicidal ideation   Past Medical History:  Diagnosis Date  . Anxiety disorder   . Atrial fibrillation ([place])   . Avascular necrosis of hip ([place])    left  . CHF (congestive heart failure) ([place])   . COPD (chronic obstructive pulmonary disease) ([place])   . ETOH abuse   . HTN (hypertension)   . Hyperglycemia   . On home O2    3L N/C  . Psoriasis     Past Surgical History:  Procedure Laterality Date  . JOINT REPLACEMENT    . TONSILLECTOMY    . TOTAL HIP ARTHROPLASTY  05-15-08   r hip    Medications:  HOME MEDS: Prior to Admission medications   Medication Sig Start Date End Date Taking? Authorizing Provider  adalimumab (HUMIRA PEN) 40 MG/0.8ML injection Inject 40 mg into the skin every 14 (fourteen) days.    Yes Historical Provider, MD  albuterol (PROAIR HFA) 108 (90 BASE) MCG/ACT inhaler INHALE 2 PUFFS INTO THE LUNGS EVERY 6 HOURS AS NEEDED FOR WHEEZING OR SHORTNESS OF BREATH 03/07/15  Yes Mary-Margaret Hassell Done, FNP  albuterol (PROVENTIL) (2.5 MG/3ML) 0.083% nebulizer solution USE ONE VIAL IN NEBULIZER EVERY 6 HOURS AS NEEDED FOR WHEEZING AND FOR SHORTNESS OF BREATH 12/01/14  Yes Mary-Margaret Hassell Done, FNP  ALPRAZolam Duanne Moron) 0.5 MG tablet Take 1 tablet (0.5 mg total) by mouth 2 (two) times daily. 07/17/15  Yes Mary-Margaret Hassell Done, FNP  amitriptyline (ELAVIL) 50 MG tablet Take 1 tablet by mouth at  bedtime 12/23/14  Yes Mary-Margaret Hassell Done, FNP  aspirin 325 MG tablet Take 325 mg by mouth daily.     Yes Historical Provider, MD  buPROPion (WELLBUTRIN XL)  150 MG 24 hr tablet Take 1 tablet by mouth  daily 10/04/15  Yes Mary-Margaret Hassell Done, FNP  cetirizine (ALL DAY ALLERGY) 10 MG tablet Take 10 mg by mouth daily.   Yes Historical Provider, MD  cholecalciferol  (VITAMIN D) 1000 UNITS tablet Take 1,000 Units by mouth daily.   Yes Historical Provider, MD  EQL CALCIUM CITRATE/VITAMIN D PO Take by mouth. Vitamin D 500IU with Calcium Citrate '630mg'$    Yes Historical Provider, MD  Fluticasone Furoate-Vilanterol (BREO ELLIPTA) 100-25 MCG/INH AEPB Inhale 1 puff into the lungs daily.    Yes Historical Provider, MD  furosemide (LASIX) 20 MG tablet Take 1 tablet by mouth  daily 10/04/15  Yes Mary-Margaret Hassell Done, FNP  guaifenesin (MUCUS RELIEF) 400 MG TABS tablet Take 400 mg by mouth daily as needed (for ocngestion).   Yes Historical Provider, MD  HYDROcodone-acetaminophen (NORCO) 10-325 MG tablet Take 1 tablet by mouth every 8 (eight) hours as needed. Patient taking differently: Take 1 tablet by mouth every 8 (eight) hours as needed for moderate pain.  05/25/15  Yes Mary-Margaret Hassell Done, FNP  metoprolol (LOPRESSOR) 50 MG tablet Take 1 tablet by mouth two  times daily 10/04/15  Yes Mary-Margaret Hassell Done, FNP  tiotropium (SPIRIVA) 18 MCG inhalation capsule Place 1 capsule (18 mcg total) into inhaler and inhale every morning. 06/26/15  Yes Mary-Margaret Hassell Done, FNP     Allergies:  Allergies  Allergen Reactions  . Celexa [Citalopram Hydrobromide]     Jerky movements  . Nickel   . Tramadol Hcl     REACTION: nausea and vomiting    Social History:   reports that she quit smoking about 6 years ago. She has a 35.00 pack-year smoking history. She has never used smokeless tobacco. She reports that she drinks alcohol. She reports that she does not use drugs.  Family History: Family History  Problem Relation Age of Onset  . Heart disease Father   . Hyperlipidemia Father   . Hypertension Father   . Heart attack Father   . Cancer Paternal Grandfather   . Alzheimer's disease Mother   . Diabetes Sister   . Hyperlipidemia Sister   . Hypertension Sister   . Heart attack Sister   . Peripheral vascular disease Sister   . Heart disease Brother   . Hyperlipidemia Brother    . Heart attack Brother      Physical Exam: Vitals:   10/21/15 0400 10/21/15 0700 10/21/15 0736 10/21/15 0751  BP:  139/62    Pulse:  85    Resp:  13    Temp: 97.9 F (36.6 C)     TempSrc: Axillary     SpO2:  90% 100% 100%  Weight:      Height:       Blood pressure 139/62, pulse 85, temperature 97.9 F (36.6 C), temperature source Axillary, resp. rate 13, height 5' (1.524 m), weight 41.9 kg (92 lb 6 oz), SpO2 100 %.  GEN:  Pleasant  patient lying in the stretcher in no acute distress; cooperative with exam. PSYCH:  alert and oriented x4; does not appear anxious or depressed; affect is appropriate. HEENT: Mucous membranes pink and anicteric; PERRLA; EOM intact; no cervical lymphadenopathy nor thyromegaly or carotid bruit; no JVD; There were no stridor. Neck is very supple. Breasts:: Not examined CHEST WALL: No tenderness CHEST: Normal respiration, clear to auscultation bilaterally.  HEART: Regular rate and rhythm.  There are no murmur, rub, or gallops.   BACK: No kyphosis or scoliosis; no CVA tenderness  ABDOMEN: soft and non-tender; no masses, no organomegaly, normal abdominal bowel sounds; no pannus; no intertriginous candida. There is no rebound and no distention. Rectal Exam: Not done EXTREMITIES: No bone or joint deformity; age-appropriate arthropathy of the hands and knees; no edema; no ulcerations.  There is no calf tenderness. Genitalia: not examined PULSES: 2+ and symmetric SKIN: Normal hydration no rash or ulceration CNS: Cranial nerves 2-12 grossly intact no focal lateralizing neurologic deficit.  Speech is fluent; uvula elevated with phonation, facial symmetry and tongue midline. DTR are normal bilaterally, cerebella exam is intact, barbinski is negative and strengths are equaled bilaterally.  No sensory loss.   Labs on Admission:  Basic Metabolic Panel:  Recent Labs Lab 10/17/15 0458 10/18/15 0427 10/19/15 0420 10/19/15 0846 10/19/15 1652 10/20/15 0437  10/20/15 1707 10/21/15 0528  NA 142 138 137  --   --  138  --  143  K 2.8* 3.0* 3.1*  --   --  3.0*  --  3.2*  CL 91* 94* 98*  --   --  104  --  102  CO2 40* 33* 35*  --   --  32  --  36*  GLUCOSE 133* 160* 144*  --   --  121*  --  71  BUN 30* 37* 36*  --   --  40*  --  30*  CREATININE 1.11* 0.99 0.89  --   --  0.76  --  0.69  CALCIUM 10.5* 9.8 9.0  --   --  9.6  --  10.2  MG 1.0*  --  1.4* 1.7 2.3 1.9 1.7  --   PHOS  --   --   --  2.9 3.2 2.3* 2.6  --    CBC:  Recent Labs Lab 10/15/15 1629 10/16/15 0433 10/16/15 0434 10/17/15 0458 10/18/15 0427 10/20/15 0437  WBC 6.6 5.0  --  7.5 9.9 12.3*  NEUTROABS 4.8 4.2  --   --   --   --   HGB 9.7* 9.1*  --  8.5* 9.4* 9.5*  HCT 34.6* 31.0* 30.1* 29.2* 31.1* 29.9*  MCV 105.8* 104.0*  --  104.3* 97.8 98.4  PLT 216 215  --  241 249 257   Cardiac Enzymes:  Recent Labs Lab 10/15/15 1629  TROPONINI <0.03    CBG:  Recent Labs Lab 10/20/15 0524 10/20/15 1124 10/20/15 1701 10/21/15 0023 10/21/15 0630  GLUCAP 143* 151* 132* 234* 84   Assessment/Plan Present on Admission: . Acute and chronic respiratory failure with hypercapnia ([place]) . COPD with acute exacerbation ([place]) . HTN (hypertension) . Anxiety . RA (rheumatoid arthritis) ([place]) . Chronic diastolic CHF (congestive heart failure) ([place]) . CAP (community acquired pneumonia) . Macrocytic anemia   PLAN:  COPD with acute on chronic respiratory failure:  She continue to do better, and her sputum grew pan sensitive pseudomonas, so antibiotics was changed to Ceftazidime and IV ZIthromax.  Will continue same. Will transfer her to the floor today.   Metabolic alkalosis post intubation: She was given IV diamox, and I think her pCO2 and Bicarb is at her baseline at this time.  She is doing well.   Chronic diastolic CHF: Compensated.  Hypokalemia: Still low today. Will continue to supplement.  Will give K phos for low phosphorous as well.   Other plans as per  orders. Code Status: FULL Haskel Khan, MD.  FACP [facility] Pager [phone] 7pm to 7am.  10/21/2015, 8:17 AM

## 2015-10-22 LAB — GLUCOSE, CAPILLARY
GLUCOSE-CAPILLARY: 89 mg/dL (ref 65–99)
GLUCOSE-CAPILLARY: 88 mg/dL (ref 65–99)
Glucose-Capillary: 105 mg/dL — ABNORMAL HIGH (ref 65–99)
Glucose-Capillary: 130 mg/dL — ABNORMAL HIGH (ref 65–99)
Glucose-Capillary: 139 mg/dL — ABNORMAL HIGH (ref 65–99)
Glucose-Capillary: 84 mg/dL (ref 65–99)

## 2015-10-22 LAB — CREATININE, SERUM
CREATININE: 0.76 mg/dL (ref 0.44–1.00)
GFR calc Af Amer: >60 mL/min (ref >60)

## 2015-10-22 NOTE — Progress Notes (Signed)
[facility] PROGRESS NOTE  [name] OFB:[id number] DOB: 08-14-1945    PCP:   Chevis Pretty, FNP   HPI:  70 year old woman admitted on 7/30 from home with complaints of shortness of breath and cough. She was found to have community-acquired pneumonia. She does have a history of hypoxic/hypercarbic chronic respiratory failure due to COPD. was intubated on 7/31 due to unresponsiveness and a PCO2 of 92.She was given IV Diamox, with improvement of her met alkalosisDr Hawkin was consulted, and helped with extubation, and she was subsequently sucessfully extubated on October 20, 2015. Her sputum came back with pseudomonas pan sensitive, and her Rocephin/Zithromax was changed to IV [place] with continuation of her IV Zithromax.  She continues to improve, but she copious sputum coughs.  After extubation, her NGT was pulled and she tolerated diet OK.     Rewiew of Systems:  Constitutional: Negative for malaise, fever and chills. No significant weight loss or weight gain Eyes: Negative for eye pain, redness and discharge, diplopia, visual changes, or flashes of light. ENMT: Negative for ear pain, hoarseness, nasal congestion, sinus pressure and sore throat. No headaches; tinnitus, drooling, or problem swallowing. Cardiovascular: Negative for chest pain, palpitations, diaphoresis, dyspnea and peripheral edema. ; No orthopnea, PND Respiratory: Negative for cough, hemoptysis, wheezing and stridor. No pleuritic chestpain. Gastrointestinal: Negative for nausea, vomiting, diarrhea, constipation, abdominal pain, melena, blood in stool, hematemesis, jaundice and rectal bleeding.    Genitourinary: Negative for frequency, dysuria, incontinence,flank pain and hematuria; Musculoskeletal: Negative for back pain and neck pain. Negative for swelling and trauma.;  Skin: . Negative for pruritus, rash, abrasions, bruising and skin lesion.; ulcerations Neuro: Negative for headache, lightheadedness and  neck stiffness. Negative for weakness, altered level of consciousness , altered mental status, extremity weakness, burning feet, involuntary movement, seizure and syncope.  Psych: negative for anxiety, depression, insomnia, tearfulness, panic attacks, hallucinations, paranoia, suicidal or homicidal ideation   Past Medical History:  Diagnosis Date  . Anxiety disorder   . Atrial fibrillation ([place])   . Avascular necrosis of hip ([place])    left  . CHF (congestive heart failure) ([place])   . COPD (chronic obstructive pulmonary disease) ([place])   . ETOH abuse   . HTN (hypertension)   . Hyperglycemia   . On home O2    3L N/C  . Psoriasis     Past Surgical History:  Procedure Laterality Date  . JOINT REPLACEMENT    . TONSILLECTOMY    . TOTAL HIP ARTHROPLASTY  05-15-08   r hip    Medications:  HOME MEDS: Prior to Admission medications   Medication Sig Start Date End Date Taking? Authorizing Provider  adalimumab (HUMIRA PEN) 40 MG/0.8ML injection Inject 40 mg into the skin every 14 (fourteen) days.    Yes Historical Provider, MD  albuterol (PROAIR HFA) 108 (90 BASE) MCG/ACT inhaler INHALE 2 PUFFS INTO THE LUNGS EVERY 6 HOURS AS NEEDED FOR WHEEZING OR SHORTNESS OF BREATH 03/07/15  Yes Mary-Margaret Hassell Done, FNP  albuterol (PROVENTIL) (2.5 MG/3ML) 0.083% nebulizer solution USE ONE VIAL IN NEBULIZER EVERY 6 HOURS AS NEEDED FOR WHEEZING AND FOR SHORTNESS OF BREATH 12/01/14  Yes Mary-Margaret Hassell Done, FNP  ALPRAZolam Duanne Moron) 0.5 MG tablet Take 1 tablet (0.5 mg total) by mouth 2 (two) times daily. 07/17/15  Yes Mary-Margaret Hassell Done, FNP  amitriptyline (ELAVIL) 50 MG tablet Take 1 tablet by mouth at  bedtime 12/23/14  Yes Mary-Margaret Hassell Done, FNP  aspirin 325 MG tablet Take 325 mg by mouth daily.  Yes Historical Provider, MD  buPROPion (WELLBUTRIN XL) 150 MG 24 hr tablet Take 1 tablet by mouth  daily 10/04/15  Yes Mary-Margaret Hassell Done, FNP  cetirizine (ALL DAY ALLERGY) 10 MG tablet Take 10 mg by mouth  daily.   Yes Historical Provider, MD  cholecalciferol (VITAMIN D) 1000 UNITS tablet Take 1,000 Units by mouth daily.   Yes Historical Provider, MD  EQL CALCIUM CITRATE/VITAMIN D PO Take by mouth. Vitamin D 500IU with Calcium Citrate '630mg'$    Yes Historical Provider, MD  Fluticasone Furoate-Vilanterol (BREO ELLIPTA) 100-25 MCG/INH AEPB Inhale 1 puff into the lungs daily.    Yes Historical Provider, MD  furosemide (LASIX) 20 MG tablet Take 1 tablet by mouth  daily 10/04/15  Yes Mary-Margaret Hassell Done, FNP  guaifenesin (MUCUS RELIEF) 400 MG TABS tablet Take 400 mg by mouth daily as needed (for ocngestion).   Yes Historical Provider, MD  HYDROcodone-acetaminophen (NORCO) 10-325 MG tablet Take 1 tablet by mouth every 8 (eight) hours as needed. Patient taking differently: Take 1 tablet by mouth every 8 (eight) hours as needed for moderate pain.  05/25/15  Yes Mary-Margaret Hassell Done, FNP  metoprolol (LOPRESSOR) 50 MG tablet Take 1 tablet by mouth two  times daily 10/04/15  Yes Mary-Margaret Hassell Done, FNP  tiotropium (SPIRIVA) 18 MCG inhalation capsule Place 1 capsule (18 mcg total) into inhaler and inhale every morning. 06/26/15  Yes Mary-Margaret Hassell Done, FNP     Allergies:  Allergies  Allergen Reactions  . Celexa [Citalopram Hydrobromide]     Jerky movements  . Nickel   . Tramadol Hcl     REACTION: nausea and vomiting    Social History:   reports that she quit smoking about 6 years ago. She has a 35.00 pack-year smoking history. She has never used smokeless tobacco. She reports that she drinks alcohol. She reports that she does not use drugs.  Family History: Family History  Problem Relation Age of Onset  . Heart disease Father   . Hyperlipidemia Father   . Hypertension Father   . Heart attack Father   . Cancer Paternal Grandfather   . Alzheimer's disease Mother   . Diabetes Sister   . Hyperlipidemia Sister   . Hypertension Sister   . Heart attack Sister   . Peripheral vascular disease Sister    . Heart disease Brother   . Hyperlipidemia Brother   . Heart attack Brother      Physical Exam: Vitals:   10/21/15 1956 10/21/15 2252 10/22/15 0809 10/22/15 0822  BP:  121/81    Pulse: 84 67    Resp: 18 16    Temp:  98.9 F (37.2 C)    TempSrc:  Oral    SpO2: 96% 91% 100% 100%  Weight:      Height:       Blood pressure 121/81, pulse 67, temperature 98.9 F (37.2 C), temperature source Oral, resp. rate 16, height 5' (1.524 m), weight 43.4 kg (95 lb 10.9 oz), SpO2 100 %.  GEN:  Pleasant patient lying in the stretcher in no acute distress; cooperative with exam. PSYCH:  alert and oriented x4; does not appear anxious or depressed; affect is appropriate. HEENT: Mucous membranes pink and anicteric; PERRLA; EOM intact; no cervical lymphadenopathy nor thyromegaly or carotid bruit; no JVD; There were no stridor. Neck is very supple. Breasts:: Not examined CHEST WALL: No tenderness CHEST: Normal respiration, clear to auscultation bilaterally.  HEART: Regular rate and rhythm.  There are no murmur, rub, or gallops.   BACK:  No kyphosis or scoliosis; no CVA tenderness ABDOMEN: soft and non-tender; no masses, no organomegaly, normal abdominal bowel sounds; no pannus; no intertriginous candida. There is no rebound and no distention. Rectal Exam: Not done EXTREMITIES: No bone or joint deformity; age-appropriate arthropathy of the hands and knees; no edema; no ulcerations.  There is no calf tenderness. Genitalia: not examined PULSES: 2+ and symmetric SKIN: Normal hydration no rash or ulceration CNS: Cranial nerves 2-12 grossly intact no focal lateralizing neurologic deficit.  Speech is fluent; uvula elevated with phonation, facial symmetry and tongue midline. DTR are normal bilaterally, cerebella exam is intact, barbinski is negative and strengths are equaled bilaterally.  No sensory loss.   Labs on Admission:  Basic Metabolic Panel:  Recent Labs Lab 10/17/15 0458 10/18/15 0427  10/19/15 0420 10/19/15 0846 10/19/15 1652 10/20/15 0437 10/20/15 1707 10/21/15 0528 10/22/15 0600  NA 142 138 137  --   --  138  --  143  --   K 2.8* 3.0* 3.1*  --   --  3.0*  --  3.2*  --   CL 91* 94* 98*  --   --  104  --  102  --   CO2 40* 33* 35*  --   --  32  --  36*  --   GLUCOSE 133* 160* 144*  --   --  121*  --  71  --   BUN 30* 37* 36*  --   --  40*  --  30*  --   CREATININE 1.11* 0.99 0.89  --   --  0.76  --  0.69 0.76  CALCIUM 10.5* 9.8 9.0  --   --  9.6  --  10.2  --   MG 1.0*  --  1.4* 1.7 2.3 1.9 1.7  --   --   PHOS  --   --   --  2.9 3.2 2.3* 2.6  --   --     CBC:  Recent Labs Lab 10/15/15 1629 10/16/15 0433 10/16/15 0434 10/17/15 0458 10/18/15 0427 10/20/15 0437  WBC 6.6 5.0  --  7.5 9.9 12.3*  NEUTROABS 4.8 4.2  --   --   --   --   HGB 9.7* 9.1*  --  8.5* 9.4* 9.5*  HCT 34.6* 31.0* 30.1* 29.2* 31.1* 29.9*  MCV 105.8* 104.0*  --  104.3* 97.8 98.4  PLT 216 215  --  241 249 257   Cardiac Enzymes:  Recent Labs Lab 10/15/15 1629  TROPONINI <0.03    CBG:  Recent Labs Lab 10/21/15 1726 10/21/15 2105 10/22/15 0008 10/22/15 0428 10/22/15 0619  GLUCAP 109* 89 105* 84 89   Assessment/Plan Present on Admission: . Acute and chronic respiratory failure with hypercapnia (HCC) . COPD with acute exacerbation (HCC) . HTN (hypertension) . Anxiety . RA (rheumatoid arthritis) (HCC) . Chronic diastolic CHF (congestive heart failure) (HCC) . CAP (community acquired pneumonia) . Macrocytic anemia   COPD with acute on chronic respiratory failure:  She continue to do better, and her sputum grew pan sensitive pseudomonas, so antibiotics was changed to Ceftazidime and IV ZIthromax. Will continue same.   Metabolic alkalosis post intubation: She was given IV diamox, and I think her pCO2 and Bicarb is at her baseline at this time. She is doing well.   Chronic diastolic CHF: Compensated.  Hypokalemia: Still low today. Will continue to supplement.  Will give K phos for low phosphorous as well.   Other plans as per orders.  Code Status: FULL Haskel Khan, MD.  FACP [facility] Pager [phone] 7pm to 7am.  10/22/2015, 11:37 AM

## 2015-10-22 NOTE — Progress Notes (Signed)
Subjective: [name] is overall about the same. [name] has been moved to a MedSurg room with telemetry. [name] is sleepy but arousable.  Objective: Vital signs in last 24 hours: Temp:  [97.2 F (36.2 C)-98.9 F (37.2 C)] 98.9 F (37.2 C) (08/05 2252) Pulse Rate:  [58-97] 67 (08/05 2252) Resp:  [13-22] 16 (08/05 2252) BP: (121-191)/(62-139) 121/81 (08/05 2252) SpO2:  [90 %-100 %] 100 % (08/06 0809) Weight:  [43.4 kg (95 lb 10.9 oz)] 43.4 kg (95 lb 10.9 oz) (08/05 1118) Weight change:  Last BM Date: 10/20/15  Intake/Output from previous day: 08/05 0701 - 08/06 0700 In: 460 [P.O.:360; IV Piggyback:100] Out: 400 [Urine:400]  PHYSICAL EXAM General appearance: [name] is sleepy but arousable and confused Resp: [name] has bilateral rhonchi Cardio: regular rate and rhythm, S1, S2 normal, no murmur, click, rub or gallop GI: soft, non-tender; bowel sounds normal; no masses,  no organomegaly Extremities: extremities normal, atraumatic, no cyanosis or edema  Lab Results:  Results for orders placed or performed during the hospital encounter of 10/15/15 (from the past 48 hour(s))  Glucose, capillary     Status: Abnormal   Collection Time: 10/20/15 11:24 AM  Result Value Ref Range   Glucose-Capillary 151 (H) 65 - 99 mg/dL   Comment 1 Notify RN    Comment 2 Document in Chart   Glucose, capillary     Status: Abnormal   Collection Time: 10/20/15  5:01 PM  Result Value Ref Range   Glucose-Capillary 132 (H) 65 - 99 mg/dL   Comment 1 Notify RN   Magnesium     Status: None   Collection Time: 10/20/15  5:07 PM  Result Value Ref Range   Magnesium 1.7 1.7 - 2.4 mg/dL  Phosphorus     Status: None   Collection Time: 10/20/15  5:07 PM  Result Value Ref Range   Phosphorus 2.6 2.5 - 4.6 mg/dL  Glucose, capillary     Status: Abnormal   Collection Time: 10/21/15 12:23 AM  Result Value Ref Range   Glucose-Capillary 234 (H) 65 - 99 mg/dL   Comment 1 Notify RN    Comment 2 Document in Chart   Triglycerides      Status: Abnormal   Collection Time: 10/21/15  5:28 AM  Result Value Ref Range   Triglycerides 259 (H) <150 mg/dL  Basic metabolic panel     Status: Abnormal   Collection Time: 10/21/15  5:28 AM  Result Value Ref Range   Sodium 143 135 - 145 mmol/L   Potassium 3.2 (L) 3.5 - 5.1 mmol/L   Chloride 102 101 - 111 mmol/L   CO2 36 (H) 22 - 32 mmol/L   Glucose, Bld 71 65 - 99 mg/dL   BUN 30 (H) 6 - 20 mg/dL   Creatinine, Ser 0.69 0.44 - 1.00 mg/dL   Calcium 10.2 8.9 - 10.3 mg/dL   GFR calc non Af Amer >60 >60 mL/min   GFR calc Af Amer >60 >60 mL/min    Comment: (NOTE) The eGFR has been calculated using the CKD EPI equation. This calculation has not been validated in all clinical situations. eGFR's persistently <60 mL/min signify possible Chronic Kidney Disease.    Anion gap 5 5 - 15  Glucose, capillary     Status: None   Collection Time: 10/21/15  6:30 AM  Result Value Ref Range   Glucose-Capillary 84 65 - 99 mg/dL  Glucose, capillary     Status: Abnormal   Collection Time: 10/21/15 11:17 AM  Result Value Ref Range   Glucose-Capillary 101 (H) 65 - 99 mg/dL  Glucose, capillary     Status: Abnormal   Collection Time: 10/21/15  5:26 PM  Result Value Ref Range   Glucose-Capillary 109 (H) 65 - 99 mg/dL   Comment 1 Notify RN    Comment 2 Document in Chart   Glucose, capillary     Status: None   Collection Time: 10/21/15  9:05 PM  Result Value Ref Range   Glucose-Capillary 89 65 - 99 mg/dL   Comment 1 Notify RN    Comment 2 Document in Chart   Glucose, capillary     Status: Abnormal   Collection Time: 10/22/15 12:08 AM  Result Value Ref Range   Glucose-Capillary 105 (H) 65 - 99 mg/dL   Comment 1 Notify RN    Comment 2 Document in Chart   Glucose, capillary     Status: None   Collection Time: 10/22/15  4:28 AM  Result Value Ref Range   Glucose-Capillary 84 65 - 99 mg/dL   Comment 1 Notify RN    Comment 2 Document in Chart   Creatinine, serum     Status: None   Collection  Time: 10/22/15  6:00 AM  Result Value Ref Range   Creatinine, Ser 0.76 0.44 - 1.00 mg/dL   GFR calc non Af Amer >60 >60 mL/min   GFR calc Af Amer >60 >60 mL/min    Comment: (NOTE) The eGFR has been calculated using the CKD EPI equation. This calculation has not been validated in all clinical situations. eGFR's persistently <60 mL/min signify possible Chronic Kidney Disease.   Glucose, capillary     Status: None   Collection Time: 10/22/15  6:19 AM  Result Value Ref Range   Glucose-Capillary 89 65 - 99 mg/dL   Comment 1 Notify RN    Comment 2 Document in Chart     ABGS  Recent Labs  10/20/15 0450  PHART 7.410  PO2ART 150.00*  TCO2 13.0  HCO3 29.3*   CULTURES Recent Results (from the past 240 hour(s))  MRSA PCR Screening     Status: None   Collection Time: 10/15/15  9:54 PM  Result Value Ref Range Status   MRSA by PCR NEGATIVE NEGATIVE Final    Comment:        The GeneXpert MRSA Assay (FDA approved for NASAL specimens only), is one component of a comprehensive MRSA colonization surveillance program. It is not intended to diagnose MRSA infection nor to guide or monitor treatment for MRSA infections.   Culture, expectorated sputum-assessment     Status: None   Collection Time: 10/16/15  1:43 PM  Result Value Ref Range Status   Specimen Description TRACHEAL ASPIRATE  Final   Special Requests NONE  Final   Sputum evaluation   Final    THIS SPECIMEN IS ACCEPTABLE. RESPIRATORY CULTURE REPORT TO FOLLOW. Performed at [facility]    Report Status 10/16/2015 FINAL  Final  Culture, respiratory (NON-Expectorated)     Status: None   Collection Time: 10/16/15  1:43 PM  Result Value Ref Range Status   Specimen Description TRACHEAL ASPIRATE  Final   Special Requests NONE  Final   Gram Stain   Final    ABUNDANT WBC PRESENT,BOTH PMN AND MONONUCLEAR NO ORGANISMS SEEN Performed at [facility]    Culture MODERATE PSEUDOMONAS AERUGINOSA  Final   Report  Status 10/20/2015 FINAL  Final   Organism ID, Bacteria PSEUDOMONAS AERUGINOSA  Final  Susceptibility   Pseudomonas aeruginosa - MIC*    CEFTAZIDIME 2 SENSITIVE Sensitive     CIPROFLOXACIN <=0.25 SENSITIVE Sensitive     GENTAMICIN <=1 SENSITIVE Sensitive     IMIPENEM 2 SENSITIVE Sensitive     PIP/TAZO <=4 SENSITIVE Sensitive     CEFEPIME <=1 SENSITIVE Sensitive     * MODERATE PSEUDOMONAS AERUGINOSA   Studies/Results: No results found.  Medications:  Prior to Admission:  Prescriptions Prior to Admission  Medication Sig Dispense Refill Last Dose  . adalimumab (HUMIRA PEN) 40 MG/0.8ML injection Inject 40 mg into the skin every 14 (fourteen) days.    Past Month at Unknown time  . albuterol (PROAIR HFA) 108 (90 BASE) MCG/ACT inhaler INHALE 2 PUFFS INTO THE LUNGS EVERY 6 HOURS AS NEEDED FOR WHEEZING OR SHORTNESS OF BREATH 1 Inhaler 2 Past Week at Unknown time  . albuterol (PROVENTIL) (2.5 MG/3ML) 0.083% nebulizer solution USE ONE VIAL IN NEBULIZER EVERY 6 HOURS AS NEEDED FOR WHEEZING AND FOR SHORTNESS OF BREATH 1080 mL 2 Past Week at Unknown time  . ALPRAZolam (XANAX) 0.5 MG tablet Take 1 tablet (0.5 mg total) by mouth 2 (two) times daily. 180 tablet 0 Past Week at Unknown time  . amitriptyline (ELAVIL) 50 MG tablet Take 1 tablet by mouth at  bedtime 90 tablet 1 Past Week at Unknown time  . aspirin 325 MG tablet Take 325 mg by mouth daily.     Past Week at Unknown time  . buPROPion (WELLBUTRIN XL) 150 MG 24 hr tablet Take 1 tablet by mouth  daily 90 tablet 0 Past Week at Unknown time  . cetirizine (ALL DAY ALLERGY) 10 MG tablet Take 10 mg by mouth daily.   Past Week at Unknown time  . cholecalciferol (VITAMIN D) 1000 UNITS tablet Take 1,000 Units by mouth daily.   Past Week at Unknown time  . EQL CALCIUM CITRATE/VITAMIN D PO Take by mouth. Vitamin D 500IU with Calcium Citrate '630mg'$    Past Week at Unknown time  . Fluticasone Furoate-Vilanterol (BREO ELLIPTA) 100-25 MCG/INH AEPB Inhale 1 puff  into the lungs daily.    Past Week at Unknown time  . furosemide (LASIX) 20 MG tablet Take 1 tablet by mouth  daily 90 tablet 0 Past Week at Unknown time  . guaifenesin (MUCUS RELIEF) 400 MG TABS tablet Take 400 mg by mouth daily as needed (for ocngestion).   unknown  . HYDROcodone-acetaminophen (NORCO) 10-325 MG tablet Take 1 tablet by mouth every 8 (eight) hours as needed. (Patient taking differently: Take 1 tablet by mouth every 8 (eight) hours as needed for moderate pain. ) 60 tablet 0 Past Week at Unknown time  . metoprolol (LOPRESSOR) 50 MG tablet Take 1 tablet by mouth two  times daily 180 tablet 0 Past Week at Unknown time  . tiotropium (SPIRIVA) 18 MCG inhalation capsule Place 1 capsule (18 mcg total) into inhaler and inhale every morning. 90 capsule 0 Past Week at Unknown time   Scheduled: . amitriptyline  50 mg Oral QHS  . antiseptic oral rinse  7 mL Mouth Rinse BID  . aspirin  325 mg Oral Daily  . azithromycin  500 mg Intravenous Q24H  . buPROPion  150 mg Oral Daily  . cefTAZidime (FORTAZ)  IV  2 g Intravenous Q12H  . cholecalciferol  1,000 Units Oral Daily  . enoxaparin (LOVENOX) injection  40 mg Subcutaneous Q24H  . famotidine (PEPCID) IV  20 mg Intravenous Q12H  . feeding supplement (VITAL HIGH PROTEIN)  1,000 mL Per Tube Q24H  . fluticasone furoate-vilanterol  1 puff Inhalation Daily  . insulin aspart  0-9 Units Subcutaneous Q6H  . ipratropium  0.5 mg Nebulization Q6H  . levalbuterol  0.63 mg Nebulization Q6H  . loratadine  10 mg Oral Daily  . metoprolol  50 mg Oral BID  . phosphorus  500 mg Oral BID  . potassium chloride  40 mEq Oral Daily  . predniSONE  40 mg Oral QAC breakfast   Continuous: . fentaNYL infusion INTRAVENOUS Stopped (10/20/15 1000)   RKY:HCWCBJSE (SUBLIMAZE) injection, fentaNYL, guaiFENesin, HYDROcodone-acetaminophen, levalbuterol, LORazepam  Assesment: [name] has community-acquired pneumonia and acute on chronic respiratory failure with hypoxia and  hypercapnia. [name] was intubated and has now been off the ventilator about 48 hours. [name] is confused. [name] has severe protein calorie malnutrition and is receiving supplements. Principal Problem:   CAP (community acquired pneumonia) Active Problems:   HTN (hypertension)   RA (rheumatoid arthritis) (HCC)   Acute and chronic respiratory failure with hypercapnia (HCC)   COPD with acute exacerbation (HCC)   Anxiety   Chronic diastolic CHF (congestive heart failure) (HCC)   Macrocytic anemia   Acute respiratory failure with hypoxia and hypercapnia (HCC)   Protein-calorie malnutrition, severe    Plan: Continue treatments. [name] seems to be slowly improving.    LOS: 7 days   Paz Fuentes L 10/22/2015, 8:14 AM

## 2015-10-23 LAB — GLUCOSE, CAPILLARY
GLUCOSE-CAPILLARY: 105 mg/dL — AB (ref 65–99)
GLUCOSE-CAPILLARY: 106 mg/dL — AB (ref 65–99)
GLUCOSE-CAPILLARY: 130 mg/dL — AB (ref 65–99)
GLUCOSE-CAPILLARY: 77 mg/dL (ref 65–99)
Glucose-Capillary: 147 mg/dL — ABNORMAL HIGH (ref 65–99)

## 2015-10-23 LAB — C DIFFICILE QUICK SCREEN W PCR REFLEX
C DIFFICLE (CDIFF) ANTIGEN: NEGATIVE
C Diff interpretation: NOT DETECTED
C Diff toxin: NEGATIVE

## 2015-10-23 MED ORDER — IPRATROPIUM BROMIDE 0.02 % IN SOLN
0.5000 mg | Freq: Three times a day (TID) | RESPIRATORY_TRACT | Status: DC
  Administered 2015-10-23 – 2015-10-25 (×7): 0.5 mg via RESPIRATORY_TRACT
  Filled 2015-10-23 (×8): qty 2.5

## 2015-10-23 MED ORDER — LEVALBUTEROL HCL 0.63 MG/3ML IN NEBU
0.6300 mg | INHALATION_SOLUTION | Freq: Three times a day (TID) | RESPIRATORY_TRACT | Status: DC
  Administered 2015-10-23 – 2015-10-25 (×7): 0.63 mg via RESPIRATORY_TRACT
  Filled 2015-10-23 (×8): qty 3

## 2015-10-23 MED ORDER — ENSURE ENLIVE PO LIQD
237.0000 mL | Freq: Two times a day (BID) | ORAL | Status: DC
  Administered 2015-10-23 – 2015-10-25 (×6): 237 mL via ORAL

## 2015-10-23 NOTE — Progress Notes (Signed)
Patient has had 2 loose watery BMs today.  MD made aware.

## 2015-10-23 NOTE — Care Management Important Message (Signed)
Important Message  Patient Details  Name: [name] MRN: [id number] Date of Birth: 1945-11-17   Medicare Important Message Given:  Yes    Sherald Barge, RN 10/23/2015, 2:56 PM

## 2015-10-23 NOTE — Progress Notes (Signed)
[facility] PROGRESS NOTE  [name] VQQ:[id number] DOB: [birth year]    PCP:   Chevis Pretty, FNP   HPI:  70 year old woman admitted on 7/30 from home with complaints of shortness of breath and cough. She was found to have community-acquired pneumonia. She does have a history of hypoxic/hypercarbic chronic respiratory failure due to COPD. was intubated on 7/31 due to unresponsiveness and a PCO2 of 92.She was given IV Diamox, with improvement of her met alkalosisDr Hawkin was consulted, and helpedwith extubation, and she was subsequently sucessfully extubated on October 20, 2015. Her sputum came back with pseudomonas pan sensitive, and her Rocephin/Zithromax was changed to IV [place] with continuation of her IV Zithromax.  She continues to improve, but she copious sputum coughs.  After extubation, her NGT was pulled and she tolerated diet OK.  She is improving but is still weak and having coughs.   Rewiew of Systems:  Constitutional: Negative for malaise, fever and chills. No significant weight loss or weight gain Eyes: Negative for eye pain, redness and discharge, diplopia, visual changes, or flashes of light. ENMT: Negative for ear pain, hoarseness, nasal congestion, sinus pressure and sore throat. No headaches; tinnitus, drooling, or problem swallowing. Cardiovascular: Negative for chest pain, palpitations, diaphoresis, dyspnea and peripheral edema. ; No orthopnea, PND Respiratory: Negative for cough, hemoptysis, wheezing and stridor. No pleuritic chestpain. Gastrointestinal: Negative for nausea, vomiting, diarrhea, constipation, abdominal pain, melena, blood in stool, hematemesis, jaundice and rectal bleeding.    Genitourinary: Negative for frequency, dysuria, incontinence,flank pain and hematuria; Musculoskeletal: Negative for back pain and neck pain. Negative for swelling and trauma.;  Skin: . Negative for pruritus, rash, abrasions, bruising and skin lesion.;  ulcerations Neuro: Negative for headache, lightheadedness and neck stiffness. Negative for weakness, altered level of consciousness , altered mental status, extremity weakness, burning feet, involuntary movement, seizure and syncope.  Psych: negative for anxiety, depression, insomnia, tearfulness, panic attacks, hallucinations, paranoia, suicidal or homicidal ideation    Past Medical History:  Diagnosis Date  . Anxiety disorder   . Atrial fibrillation ([place])   . Avascular necrosis of hip ([place])    left  . CHF (congestive heart failure) ([place])   . COPD (chronic obstructive pulmonary disease) ([place])   . ETOH abuse   . HTN (hypertension)   . Hyperglycemia   . On home O2    3L N/C  . Psoriasis     Past Surgical History:  Procedure Laterality Date  . JOINT REPLACEMENT    . TONSILLECTOMY    . TOTAL HIP ARTHROPLASTY  05-15-08   r hip    Medications:  HOME MEDS: Prior to Admission medications   Medication Sig Start Date End Date Taking? Authorizing Provider  adalimumab (HUMIRA PEN) 40 MG/0.8ML injection Inject 40 mg into the skin every 14 (fourteen) days.    Yes Historical Provider, MD  albuterol (PROAIR HFA) 108 (90 BASE) MCG/ACT inhaler INHALE 2 PUFFS INTO THE LUNGS EVERY 6 HOURS AS NEEDED FOR WHEEZING OR SHORTNESS OF BREATH 03/07/15  Yes Mary-Margaret Hassell Done, FNP  albuterol (PROVENTIL) (2.5 MG/3ML) 0.083% nebulizer solution USE ONE VIAL IN NEBULIZER EVERY 6 HOURS AS NEEDED FOR WHEEZING AND FOR SHORTNESS OF BREATH 12/01/14  Yes Mary-Margaret Hassell Done, FNP  ALPRAZolam Duanne Moron) 0.5 MG tablet Take 1 tablet (0.5 mg total) by mouth 2 (two) times daily. 07/17/15  Yes Mary-Margaret Hassell Done, FNP  amitriptyline (ELAVIL) 50 MG tablet Take 1 tablet by mouth at  bedtime 12/23/14  Yes Mary-Margaret Hassell Done, FNP  aspirin 325 MG tablet  Take 325 mg by mouth daily.     Yes Historical Provider, MD  buPROPion (WELLBUTRIN XL) 150 MG 24 hr tablet Take 1 tablet by mouth  daily 10/04/15  Yes Mary-Margaret Hassell Done, FNP   cetirizine (ALL DAY ALLERGY) 10 MG tablet Take 10 mg by mouth daily.   Yes Historical Provider, MD  cholecalciferol (VITAMIN D) 1000 UNITS tablet Take 1,000 Units by mouth daily.   Yes Historical Provider, MD  EQL CALCIUM CITRATE/VITAMIN D PO Take by mouth. Vitamin D 500IU with Calcium Citrate '630mg'$    Yes Historical Provider, MD  Fluticasone Furoate-Vilanterol (BREO ELLIPTA) 100-25 MCG/INH AEPB Inhale 1 puff into the lungs daily.    Yes Historical Provider, MD  furosemide (LASIX) 20 MG tablet Take 1 tablet by mouth  daily 10/04/15  Yes Mary-Margaret Hassell Done, FNP  guaifenesin (MUCUS RELIEF) 400 MG TABS tablet Take 400 mg by mouth daily as needed (for ocngestion).   Yes Historical Provider, MD  HYDROcodone-acetaminophen (NORCO) 10-325 MG tablet Take 1 tablet by mouth every 8 (eight) hours as needed. Patient taking differently: Take 1 tablet by mouth every 8 (eight) hours as needed for moderate pain.  05/25/15  Yes Mary-Margaret Hassell Done, FNP  metoprolol (LOPRESSOR) 50 MG tablet Take 1 tablet by mouth two  times daily 10/04/15  Yes Mary-Margaret Hassell Done, FNP  tiotropium (SPIRIVA) 18 MCG inhalation capsule Place 1 capsule (18 mcg total) into inhaler and inhale every morning. 06/26/15  Yes Mary-Margaret Hassell Done, FNP     Allergies:  Allergies  Allergen Reactions  . Celexa [Citalopram Hydrobromide]     Jerky movements  . Nickel   . Tramadol Hcl     REACTION: nausea and vomiting    Social History:   reports that she quit smoking about 6 years ago. She has a 35.00 pack-year smoking history. She has never used smokeless tobacco. She reports that she drinks alcohol. She reports that she does not use drugs.  Family History: Family History  Problem Relation Age of Onset  . Heart disease Father   . Hyperlipidemia Father   . Hypertension Father   . Heart attack Father   . Cancer Paternal Grandfather   . Alzheimer's disease Mother   . Diabetes Sister   . Hyperlipidemia Sister   . Hypertension Sister    . Heart attack Sister   . Peripheral vascular disease Sister   . Heart disease Brother   . Hyperlipidemia Brother   . Heart attack Brother      Physical Exam: Vitals:   10/22/15 2247 10/23/15 0620 10/23/15 0732 10/23/15 0746  BP: 130/68 136/68    Pulse: 88 72    Resp: 18 18    Temp: 98.8 F (37.1 C) 97.5 F (36.4 C)    TempSrc: Oral Oral    SpO2: 96% 96% 100% 100%  Weight:      Height:       Blood pressure 136/68, pulse 72, temperature 97.5 F (36.4 C), temperature source Oral, resp. rate 18, height 5' (1.524 m), weight 44.8 kg (98 lb 11.2 oz), SpO2 100 %.  GEN:  Pleasant  patient lying in the stretcher in no acute distress; cooperative with exam. PSYCH:  alert and oriented x4; does not appear anxious or depressed; affect is appropriate. HEENT: Mucous membranes pink and anicteric; PERRLA; EOM intact; no cervical lymphadenopathy nor thyromegaly or carotid bruit; no JVD; There were no stridor. Neck is very supple. Breasts:: Not examined CHEST WALL: No tenderness CHEST: Normal respiration, clear to auscultation bilaterally.  HEART: Regular  rate and rhythm.  There are no murmur, rub, or gallops.   BACK: No kyphosis or scoliosis; no CVA tenderness ABDOMEN: soft and non-tender; no masses, no organomegaly, normal abdominal bowel sounds; no pannus; no intertriginous candida. There is no rebound and no distention. Rectal Exam: Not done EXTREMITIES: No bone or joint deformity; age-appropriate arthropathy of the hands and knees; no edema; no ulcerations.  There is no calf tenderness. Genitalia: not examined PULSES: 2+ and symmetric SKIN: Normal hydration no rash or ulceration CNS: Cranial nerves 2-12 grossly intact no focal lateralizing neurologic deficit.  Speech is fluent; uvula elevated with phonation, facial symmetry and tongue midline. DTR are normal bilaterally, cerebella exam is intact, barbinski is negative and strengths are equaled bilaterally.  No sensory loss.   Labs on  Admission:  Basic Metabolic Panel:  Recent Labs Lab 10/17/15 0458 10/18/15 0427 10/19/15 0420 10/19/15 0846 10/19/15 1652 10/20/15 0437 10/20/15 1707 10/21/15 0528 10/22/15 0600  NA 142 138 137  --   --  138  --  143  --   K 2.8* 3.0* 3.1*  --   --  3.0*  --  3.2*  --   CL 91* 94* 98*  --   --  104  --  102  --   CO2 40* 33* 35*  --   --  32  --  36*  --   GLUCOSE 133* 160* 144*  --   --  121*  --  71  --   BUN 30* 37* 36*  --   --  40*  --  30*  --   CREATININE 1.11* 0.99 0.89  --   --  0.76  --  0.69 0.76  CALCIUM 10.5* 9.8 9.0  --   --  9.6  --  10.2  --   MG 1.0*  --  1.4* 1.7 2.3 1.9 1.7  --   --   PHOS  --   --   --  2.9 3.2 2.3* 2.6  --   --    CBC:  Recent Labs Lab 10/17/15 0458 10/18/15 0427 10/20/15 0437  WBC 7.5 9.9 12.3*  HGB 8.5* 9.4* 9.5*  HCT 29.2* 31.1* 29.9*  MCV 104.3* 97.8 98.4  PLT 241 249 257   Cardiac Enzymes: No results for input(s): CKTOTAL, CKMB, CKMBINDEX, TROPONINI in the last 168 hours.  CBG:  Recent Labs Lab 10/22/15 1920 10/22/15 2201 10/23/15 0025 10/23/15 0531 10/23/15 1150  GLUCAP 139* 130* 130* 77 147*    Assessment/Plan Present on Admission: . Acute and chronic respiratory failure with hypercapnia ([place]) . COPD with acute exacerbation ([place]) . HTN (hypertension) . Anxiety . RA (rheumatoid arthritis) ([place]) . Chronic diastolic CHF (congestive heart failure) ([place]) . CAP (community acquired pneumonia) . Macrocytic anemia  PLAN:  COPD with acute on chronic respiratory failure:  She continue to do better,and her sputum grew pan sensitive pseudomonas, so antibiotics was changed to Ceftazidime and IV ZIthromax. Will continue same.   Metabolic alkalosis post intubation: She was given IV diamox, and I think her pCO2 and Bicarb is at her baseline at this time. She is doing well.   Chronic diastolic CHF: Compensated.  Hypokalemia: Still low today. Will continue to supplement. Will give K phos for low phosphorous  as well.   Other plans as per orders. Code Status: FULL Haskel Khan, MD.  FACP [facility] Pager [phone] 7pm to 7am.  10/23/2015, 12:30 PM

## 2015-10-23 NOTE — Progress Notes (Signed)
Nutrition Follow-up   INTERVENTION:  Advance diet as tolerated  Add Ensure Enlive po BID, each supplement provides 350 kcal and 20 grams of protein   NUTRITION DIAGNOSIS: (Revised) Inadequate oral intake related to limited access to eat and poor appetite as evidenced by full liquids diet and 0-25% intake recorded.  GOAL:  Patient will meet greater than or equal to 90% of their needs ; not met.  MONITOR: diet advancement, supplement acceptance and meal intake once diet is advanced fully   ASSESSMENT:   70 y.o. female PMHx  COPD on O2 at baseline, CHF, HTN, RA, Depression/Anxiety. Presented to ED w/ a couple days of progressive worsening dyspnea and new productive cough. Admitted for PNA and COPD exacerbation. Initially placed on Bipap, but ultimately required intubation on 7/31  Pt was successfully extubated and stabilized to move out of ICU now. Her appetite is very poor. Diet is still liquids only. Re-assessed nutrition needs now that she is off the vent.  Labs reviewed: CBGs: 130-77, hypokalemia, BUN trending down CBG (last 3)   Recent Labs  10/22/15 2201 10/23/15 0025 10/23/15 0531  GLUCAP 130* 130* 77   Medications:  ABx, Vit D, methylprednisolone   Recent Labs Lab 10/19/15 0420  10/19/15 1652 10/20/15 0437 10/20/15 1707 10/21/15 0528 10/22/15 0600  NA 137  --   --  138  --  143  --   K 3.1*  --   --  3.0*  --  3.2*  --   CL 98*  --   --  104  --  102  --   CO2 35*  --   --  32  --  36*  --   BUN 36*  --   --  40*  --  30*  --   CREATININE 0.89  --   --  0.76  --  0.69 0.76  CALCIUM 9.0  --   --  9.6  --  10.2  --   MG 1.4*  < > 2.3 1.9 1.7  --   --   PHOS  --   < > 3.2 2.3* 2.6  --   --   GLUCOSE 144*  --   --  121*  --  71  --   < > = values in this interval not displayed. Diet Order:  Diet full liquid Room service appropriate? Yes; Fluid consistency: Thin  Skin: Dry, blister to ankle  Last BM:  8/6 small, brown stool  Height:  Ht Readings from Last  1 Encounters:  10/16/15 5' (1.524 m)   Weight:  Wt Readings from Last 1 Encounters:  10/22/15 98 lb 11.2 oz (44.8 kg)   Wt Readings from Last 10 Encounters:  10/22/15 98 lb 11.2 oz (44.8 kg)  12/23/14 101 lb (45.8 kg)  11/11/14 102 lb (46.3 kg)  06/09/14 110 lb (49.9 kg)  05/25/14 110 lb (49.9 kg)  04/14/14 111 lb (50.3 kg)  04/07/14 112 lb (50.8 kg)  03/31/14 110 lb (49.9 kg)  01/05/14 115 lb 6.4 oz (52.3 kg)  08/25/13 116 lb (52.6 kg)  Admit weight: 91.5 lbs.   Ideal Body Weight:  45 kg  BMI:  Body mass index is 17.9 kg/m.  Re-Estimated Nutritional Needs:  Kcal: 5784-6962 (to promote preservation of lean body mass) Protein:  62-75 g pro (1.5-1.8 g/kg bw) Fluid:  >1.2 liters daily  EDUCATION NEEDS:No education needs identified at this time   Colman Cater MS,RD,CSG,LDN Office: #[phone] Pager: [phone]

## 2015-10-23 NOTE — Progress Notes (Signed)
Subjective: She has improved. She is arousable and conversant. She is still confused  Objective: Vital signs in last 24 hours: Temp:  [97.5 F (36.4 C)-98.8 F (37.1 C)] 97.5 F (36.4 C) (08/07 0620) Pulse Rate:  [72-88] 72 (08/07 0620) Resp:  [18-20] 18 (08/07 0620) BP: (124-136)/(68-73) 136/68 (08/07 0620) SpO2:  [92 %-100 %] 100 % (08/07 0746) Weight:  [44.8 kg (98 lb 11.2 oz)] 44.8 kg (98 lb 11.2 oz) (08/06 1452) Weight change: 1.37 kg (3 lb 0.3 oz) Last BM Date: 10/22/15  Intake/Output from previous day: 08/06 0701 - 08/07 0700 In: 540 [P.O.:240; IV Piggyback:300] Out: -   PHYSICAL EXAM General appearance: alert, no distress and Confused Resp: rhonchi bilaterally Cardio: regular rate and rhythm, S1, S2 normal, no murmur, click, rub or gallop GI: soft, non-tender; bowel sounds normal; no masses,  no organomegaly Extremities: extremities normal, atraumatic, no cyanosis or edema  Lab Results:  Results for orders placed or performed during the hospital encounter of 10/15/15 (from the past 48 hour(s))  Glucose, capillary     Status: Abnormal   Collection Time: 10/21/15 11:17 AM  Result Value Ref Range   Glucose-Capillary 101 (H) 65 - 99 mg/dL  Glucose, capillary     Status: Abnormal   Collection Time: 10/21/15  5:26 PM  Result Value Ref Range   Glucose-Capillary 109 (H) 65 - 99 mg/dL   Comment 1 Notify RN    Comment 2 Document in Chart   Glucose, capillary     Status: None   Collection Time: 10/21/15  9:05 PM  Result Value Ref Range   Glucose-Capillary 89 65 - 99 mg/dL   Comment 1 Notify RN    Comment 2 Document in Chart   Glucose, capillary     Status: Abnormal   Collection Time: 10/22/15 12:08 AM  Result Value Ref Range   Glucose-Capillary 105 (H) 65 - 99 mg/dL   Comment 1 Notify RN    Comment 2 Document in Chart   Glucose, capillary     Status: None   Collection Time: 10/22/15  4:28 AM  Result Value Ref Range   Glucose-Capillary 84 65 - 99 mg/dL   Comment  1 Notify RN    Comment 2 Document in Chart   Creatinine, serum     Status: None   Collection Time: 10/22/15  6:00 AM  Result Value Ref Range   Creatinine, Ser 0.76 0.44 - 1.00 mg/dL   GFR calc non Af Amer >60 >60 mL/min   GFR calc Af Amer >60 >60 mL/min    Comment: (NOTE) The eGFR has been calculated using the CKD EPI equation. This calculation has not been validated in all clinical situations. eGFR's persistently <60 mL/min signify possible Chronic Kidney Disease.   Glucose, capillary     Status: None   Collection Time: 10/22/15  6:19 AM  Result Value Ref Range   Glucose-Capillary 89 65 - 99 mg/dL   Comment 1 Notify RN    Comment 2 Document in Chart   Glucose, capillary     Status: None   Collection Time: 10/22/15 11:45 AM  Result Value Ref Range   Glucose-Capillary 88 65 - 99 mg/dL   Comment 1 Notify RN    Comment 2 Document in Chart   Glucose, capillary     Status: Abnormal   Collection Time: 10/22/15  7:20 PM  Result Value Ref Range   Glucose-Capillary 139 (H) 65 - 99 mg/dL   Comment 1 Notify RN  Comment 2 Document in Chart   Glucose, capillary     Status: Abnormal   Collection Time: 10/22/15 10:01 PM  Result Value Ref Range   Glucose-Capillary 130 (H) 65 - 99 mg/dL   Comment 1 Notify RN    Comment 2 Document in Chart   Glucose, capillary     Status: Abnormal   Collection Time: 10/23/15 12:25 AM  Result Value Ref Range   Glucose-Capillary 130 (H) 65 - 99 mg/dL   Comment 1 Notify RN    Comment 2 Document in Chart   Glucose, capillary     Status: None   Collection Time: 10/23/15  5:31 AM  Result Value Ref Range   Glucose-Capillary 77 65 - 99 mg/dL   Comment 1 Notify RN    Comment 2 Document in Chart     ABGS No results for input(s): PHART, PO2ART, TCO2, HCO3 in the last 72 hours.  Invalid input(s): PCO2 CULTURES Recent Results (from the past 240 hour(s))  MRSA PCR Screening     Status: None   Collection Time: 10/15/15  9:54 PM  Result Value Ref Range  Status   MRSA by PCR NEGATIVE NEGATIVE Final    Comment:        The GeneXpert MRSA Assay (FDA approved for NASAL specimens only), is one component of a comprehensive MRSA colonization surveillance program. It is not intended to diagnose MRSA infection nor to guide or monitor treatment for MRSA infections.   Culture, expectorated sputum-assessment     Status: None   Collection Time: 10/16/15  1:43 PM  Result Value Ref Range Status   Specimen Description TRACHEAL ASPIRATE  Final   Special Requests NONE  Final   Sputum evaluation   Final    THIS SPECIMEN IS ACCEPTABLE. RESPIRATORY CULTURE REPORT TO FOLLOW. Performed at [facility]    Report Status 10/16/2015 FINAL  Final  Culture, respiratory (NON-Expectorated)     Status: None   Collection Time: 10/16/15  1:43 PM  Result Value Ref Range Status   Specimen Description TRACHEAL ASPIRATE  Final   Special Requests NONE  Final   Gram Stain   Final    ABUNDANT WBC PRESENT,BOTH PMN AND MONONUCLEAR NO ORGANISMS SEEN Performed at [place]  Final   Report Status 10/20/2015 FINAL  Final   Organism ID, Bacteria PSEUDOMONAS AERUGINOSA  Final      Susceptibility   Pseudomonas aeruginosa - MIC*    CEFTAZIDIME 2 SENSITIVE Sensitive     CIPROFLOXACIN <=0.25 SENSITIVE Sensitive     GENTAMICIN <=1 SENSITIVE Sensitive     IMIPENEM 2 SENSITIVE Sensitive     PIP/TAZO <=4 SENSITIVE Sensitive     CEFEPIME <=1 SENSITIVE Sensitive     * MODERATE PSEUDOMONAS AERUGINOSA   Studies/Results: No results found.  Medications:  Prior to Admission:  Prescriptions Prior to Admission  Medication Sig Dispense Refill Last Dose  . adalimumab (HUMIRA PEN) 40 MG/0.8ML injection Inject 40 mg into the skin every 14 (fourteen) days.    Past Month at Unknown time  . albuterol (PROAIR HFA) 108 (90 BASE) MCG/ACT inhaler INHALE 2 PUFFS INTO THE LUNGS EVERY 6 HOURS AS NEEDED FOR WHEEZING OR SHORTNESS OF  BREATH 1 Inhaler 2 Past Week at Unknown time  . albuterol (PROVENTIL) (2.5 MG/3ML) 0.083% nebulizer solution USE ONE VIAL IN NEBULIZER EVERY 6 HOURS AS NEEDED FOR WHEEZING AND FOR SHORTNESS OF BREATH 1080 mL 2 Past Week at Unknown time  .  ALPRAZolam (XANAX) 0.5 MG tablet Take 1 tablet (0.5 mg total) by mouth 2 (two) times daily. 180 tablet 0 Past Week at Unknown time  . amitriptyline (ELAVIL) 50 MG tablet Take 1 tablet by mouth at  bedtime 90 tablet 1 Past Week at Unknown time  . aspirin 325 MG tablet Take 325 mg by mouth daily.     Past Week at Unknown time  . buPROPion (WELLBUTRIN XL) 150 MG 24 hr tablet Take 1 tablet by mouth  daily 90 tablet 0 Past Week at Unknown time  . cetirizine (ALL DAY ALLERGY) 10 MG tablet Take 10 mg by mouth daily.   Past Week at Unknown time  . cholecalciferol (VITAMIN D) 1000 UNITS tablet Take 1,000 Units by mouth daily.   Past Week at Unknown time  . EQL CALCIUM CITRATE/VITAMIN D PO Take by mouth. Vitamin D 500IU with Calcium Citrate '630mg'$    Past Week at Unknown time  . Fluticasone Furoate-Vilanterol (BREO ELLIPTA) 100-25 MCG/INH AEPB Inhale 1 puff into the lungs daily.    Past Week at Unknown time  . furosemide (LASIX) 20 MG tablet Take 1 tablet by mouth  daily 90 tablet 0 Past Week at Unknown time  . guaifenesin (MUCUS RELIEF) 400 MG TABS tablet Take 400 mg by mouth daily as needed (for ocngestion).   unknown  . HYDROcodone-acetaminophen (NORCO) 10-325 MG tablet Take 1 tablet by mouth every 8 (eight) hours as needed. (Patient taking differently: Take 1 tablet by mouth every 8 (eight) hours as needed for moderate pain. ) 60 tablet 0 Past Week at Unknown time  . metoprolol (LOPRESSOR) 50 MG tablet Take 1 tablet by mouth two  times daily 180 tablet 0 Past Week at Unknown time  . tiotropium (SPIRIVA) 18 MCG inhalation capsule Place 1 capsule (18 mcg total) into inhaler and inhale every morning. 90 capsule 0 Past Week at Unknown time   Scheduled: . amitriptyline  50 mg  Oral QHS  . antiseptic oral rinse  7 mL Mouth Rinse BID  . aspirin  325 mg Oral Daily  . buPROPion  150 mg Oral Daily  . cefTAZidime (FORTAZ)  IV  2 g Intravenous Q12H  . cholecalciferol  1,000 Units Oral Daily  . enoxaparin (LOVENOX) injection  40 mg Subcutaneous Q24H  . famotidine (PEPCID) IV  20 mg Intravenous Q12H  . feeding supplement (VITAL HIGH PROTEIN)  1,000 mL Per Tube Q24H  . fluticasone furoate-vilanterol  1 puff Inhalation Daily  . insulin aspart  0-9 Units Subcutaneous Q6H  . ipratropium  0.5 mg Nebulization TID  . levalbuterol  0.63 mg Nebulization TID  . loratadine  10 mg Oral Daily  . metoprolol  50 mg Oral BID  . phosphorus  500 mg Oral BID  . potassium chloride  40 mEq Oral Daily  . predniSONE  40 mg Oral QAC breakfast   Continuous:  ERX:[id number], HYDROcodone-acetaminophen, levalbuterol, LORazepam  Assesment: She was admitted with community-acquired pneumonia and acute on chronic hypoxic/hypercapnic respiratory failure requiring ventilator support. She has now been off the ventilator for about 4 days. I think she is approaching baseline. Principal Problem:   CAP (community acquired pneumonia) Active Problems:   HTN (hypertension)   RA (rheumatoid arthritis) ([facility])   Acute and chronic respiratory failure with hypercapnia ([facility])   COPD with acute exacerbation ([facility])   Anxiety   Chronic diastolic CHF (congestive heart failure) ([facility])   Macrocytic anemia   Acute respiratory failure with hypoxia and hypercapnia ([place])   Protein-calorie  malnutrition, severe    Plan: Continue treatments. I will follow more peripherally. Thank you for allowing me to see her with you    LOS: 8 days   Raahi Korber L 10/23/2015, 8:46 AM

## 2015-10-23 NOTE — Evaluation (Signed)
Physical Therapy Evaluation Patient Details Name: [name] MRN: [id number] DOB: 12/22/1945 Today's Date: 10/23/2015   History of Present Illness  70 yo F admitted 10/15/2015 with a couple days of progressive worsening dyspnea and new productive cough with thick green sputum.  Her symptoms progressively worsened and her husband reports that she "passed out" twice today. Patient denies passing out, reporting that she wasn't talking because she couldn't catch her breath.  Pt was intubated 10/16/2015 and successfully extubated on October 20, 2015.  Her sputum came back with pseudomonas pan sensitive.  Dx: pneumonia with COPD exacerbation and acute on chronic hypercarbic respiratory failure.  Pt continues to be tachycardic, and on BiPAP.  PMH: COPD on 3L/m [place] at baseline, chronic diastolic CHF, hypertension, rheumatoid arthritis, depression, anxiety, A-fib, AVN of the L hip, ETOH abuse, joint replacement.  Clinical Impression  Pt received in bed, son and dtr in law present, and pt is agreeable to PT evaluation.  Pt expressed that she normally uses a cane for ambulation and is independent with ADL's.  At this point she is requiring Max A for transfers bed<>BSC.  She is only oriented to self, and family reports that pt's cognition has been on the decline for the past 2-3 weeks.  At this point, she is recommended for SNF due to need for skilled PT to improve strength, transfers, endurance, and functional mobility.  She is also recommended for a SLP cognitive evaluation due to recent changes in cognition.      Follow Up Recommendations SNF    Equipment Recommendations  None recommended by PT    Recommendations for Other Services Speech consult (Cognitive evaluation - Family reports decline in cognition over the past few weeks. )     Precautions / Restrictions Precautions Precautions: Fall Precaution Comments: Due to weakness.  Restrictions Weight Bearing Restrictions: No RLE Weight Bearing:  Weight bearing as tolerated LLE Weight Bearing: Weight bearing as tolerated      Mobility  Bed Mobility Overal bed mobility: Needs Assistance Bed Mobility: Supine to Sit     Supine to sit: Min guard;HOB elevated     General bed mobility comments: increased time.   Transfers Overall transfer level: Needs assistance Equipment used: Rolling walker (2 wheeled) Transfers: Sit to/from [facility] Sit to Stand: Max assist (Pt has difficulty with sequencing of this transfer, pulls up on RW despite PT instructing her to push from bed/BSC. ) Stand pivot transfers: Max assist (Pt demonstrates extreme weakness, and poor LE control when transferring bed<>BSC.  PT is very ataxic with LE's, and stepping on her own feet during transfer. )          Ambulation/Gait Ambulation/Gait assistance:  (NA due to weakness. )              Stairs            Wheelchair Mobility    Modified Rankin (Stroke Patients Only)       Balance Overall balance assessment: Needs assistance Sitting-balance support: Bilateral upper extremity supported;Feet supported (Pt requires supervision) Sitting balance-Leahy Scale: Good     Standing balance support: Bilateral upper extremity supported Standing balance-Leahy Scale: Poor                               Pertinent Vitals/Pain Pain Assessment: No/denies pain    Home Living   Living Arrangements: Spouse/significant other Available Help at Discharge:  (dtr lives next door and  normally helps with things, but has recently had an operation ) Type of Home: Mobile home Home Access: Stairs to enter;Ramped entrance   Entrance Stairs-Number of Steps: 2 in the front and 2 in the back.  Home Layout: One level Home Equipment: Cane - single point;Walker - 4 wheels;Bedside commode;Shower seat      Prior Function     Gait / Transfers Assistance Needed: Pt states she was using the cane to ambulate.  ADL's / Homemaking  Assistance Needed: independent with dressing and bathing.         Hand Dominance   Dominant Hand: Right    Extremity/Trunk Assessment   Upper Extremity Assessment: Generalized weakness           Lower Extremity Assessment: Generalized weakness         Communication   Communication: Expressive difficulties  Cognition Arousal/Alertness: Awake/alert Behavior During Therapy: WFL for tasks assessed/performed Overall Cognitive Status: Impaired/Different from baseline Area of Impairment: Orientation;Safety/judgement;Problem solving Orientation Level: Disoriented to;Time;Place (Thought she was in [place], at her son's house, but stated she was at some sort of rehab hospital.)   Memory: Decreased recall of precautions;Decreased short-term memory   Safety/Judgement: Decreased awareness of safety;Decreased awareness of deficits   Problem Solving: Difficulty sequencing;Requires verbal cues;Requires tactile cues General Comments: Pt is hyperverbal at times, and conversation is tangental.     General Comments      Exercises        Assessment/Plan    PT Assessment Patient needs continued PT services  PT Diagnosis Difficulty walking;Abnormality of gait;Generalized weakness   PT Problem List Decreased strength;Decreased activity tolerance;Decreased balance;Decreased mobility;Decreased coordination;Decreased cognition;Decreased knowledge of use of DME;Decreased safety awareness;Decreased knowledge of precautions;Cardiopulmonary status limiting activity  PT Treatment Interventions DME instruction;Gait training;Functional mobility training;Therapeutic activities;Therapeutic exercise;Balance training;Neuromuscular re-education;Cognitive remediation;Patient/family education   PT Goals (Current goals can be found in the Care Plan section) Acute Rehab PT Goals Patient Stated Goal: pt wants to get strong enough to go home.  PT Goal Formulation: With patient/family Time For Goal  Achievement: 11/06/15 Potential to Achieve Goals: Fair    Frequency Min 4X/week   Barriers to discharge Decreased caregiver support      Co-evaluation               End of Session Equipment Utilized During Treatment: Gait belt Activity Tolerance: Patient tolerated treatment well Patient left: in bed;with call bell/phone within reach;with family/visitor present      Functional Assessment Tool Used: [employer] "6-clicks"  Functional Limitation: Mobility: Walking and moving around Mobility: Walking and Moving Around Current Status [phone]): At least 60 percent but less than 80 percent impaired, limited or restricted Mobility: Walking and Moving Around Goal Status [phone]): At least 40 percent but less than 60 percent impaired, limited or restricted    Time: [id number] PT Time Calculation (min) (ACUTE ONLY): 43 min   Charges:   PT Evaluation $PT Eval Moderate Complexity: 1 Procedure PT Treatments $Therapeutic Activity: 23-37 mins   PT G Codes:   PT G-Codes **NOT FOR INPATIENT CLASS** Functional Assessment Tool Used: [employer] "6-clicks"  Functional Limitation: Mobility: Walking and moving around Mobility: Walking and Moving Around Current Status [phone]): At least 60 percent but less than 80 percent impaired, limited or restricted Mobility: Walking and Moving Around Goal Status [phone]): At least 40 percent but less than 60 percent impaired, limited or restricted    Beth Nayel Purdy, PT, DPT X: [phone]

## 2015-10-24 LAB — GLUCOSE, CAPILLARY
GLUCOSE-CAPILLARY: 123 mg/dL — AB (ref 65–99)
GLUCOSE-CAPILLARY: 81 mg/dL (ref 65–99)
Glucose-Capillary: 74 mg/dL (ref 65–99)
Glucose-Capillary: 118 mg/dL — ABNORMAL HIGH (ref 65–99)

## 2015-10-24 LAB — TRIGLYCERIDES: Triglycerides: 159 mg/dL — ABNORMAL HIGH (ref <150)

## 2015-10-24 NOTE — Progress Notes (Signed)
[facility] PROGRESS NOTE  [name] HBZ:[id number] DOB: 31-Jul-1945    PCP:   Chevis Pretty, FNP   HPI:  70 year old woman admitted on 7/30 from home with complaints of shortness of breath and cough. She was found to have community-acquired pneumonia. She does have a history of hypoxic/hypercarbic chronic respiratory failure due to COPD. was intubated on 7/31 due to unresponsiveness and a PCO2 of 92.She was given IV Diamox, with improvement of her met alkalosisDr Hawkin was consulted, and helpedwith extubation, and she was subsequently sucessfully extubated on October 20, 2015. Her sputum came back with pseudomonas pan sensitive, and her Rocephin/Zithromax was changed to IV [place] with continuation of her IV Zithromax for a few days, now Zithromax was discontinued. She continues to improve, but she copious sputum coughs. After extubation, her NGT was pulled and she tolerated diet OK. She is improving but is still weak and having coughs. It became clear that she cannot go home due to her weakness, and after speaking to her son, decision was to discharge her to SNF.  She has been on 4/10 days of [place], and her IV access has been difficult.  PICC line will be placed today, and as soon as SNF is available, she can be discharged to complete her antibiotic course.  Dr Luan Pulling agreed with plan.   Rewiew of Systems:  Constitutional: Negative for malaise, fever and chills. No significant weight loss or weight gain Eyes: Negative for eye pain, redness and discharge, diplopia, visual changes, or flashes of light. ENMT: Negative for ear pain, hoarseness, nasal congestion, sinus pressure and sore throat. No headaches; tinnitus, drooling, or problem swallowing. Cardiovascular: Negative for chest pain, palpitations, diaphoresis, dyspnea and peripheral edema. ; No orthopnea, PND Respiratory: Negative for cough, hemoptysis, wheezing and stridor. No pleuritic chestpain. Gastrointestinal:  Negative for nausea, vomiting, diarrhea, constipation, abdominal pain, melena, blood in stool, hematemesis, jaundice and rectal bleeding.    Genitourinary: Negative for frequency, dysuria, incontinence,flank pain and hematuria; Musculoskeletal: Negative for back pain and neck pain. Negative for swelling and trauma.;  Skin: . Negative for pruritus, rash, abrasions, bruising and skin lesion.; ulcerations Neuro: Negative for headache, lightheadedness and neck stiffness. Negative for weakness, altered level of consciousness , altered mental status, extremity weakness, burning feet, involuntary movement, seizure and syncope.  Psych: negative for anxiety, depression, insomnia, tearfulness, panic attacks, hallucinations, paranoia, suicidal or homicidal ideation    Past Medical History:  Diagnosis Date  . Anxiety disorder   . Atrial fibrillation ([place])   . Avascular necrosis of hip ([place])    left  . CHF (congestive heart failure) ([place])   . COPD (chronic obstructive pulmonary disease) ([place])   . ETOH abuse   . HTN (hypertension)   . Hyperglycemia   . On home O2    3L N/C  . Psoriasis     Past Surgical History:  Procedure Laterality Date  . JOINT REPLACEMENT    . TONSILLECTOMY    . TOTAL HIP ARTHROPLASTY  05-15-08   r hip    Medications:  HOME MEDS: Prior to Admission medications   Medication Sig Start Date End Date Taking? Authorizing Provider  adalimumab (HUMIRA PEN) 40 MG/0.8ML injection Inject 40 mg into the skin every 14 (fourteen) days.    Yes Historical Provider, MD  albuterol (PROAIR HFA) 108 (90 BASE) MCG/ACT inhaler INHALE 2 PUFFS INTO THE LUNGS EVERY 6 HOURS AS NEEDED FOR WHEEZING OR SHORTNESS OF BREATH 03/07/15  Yes Mary-Margaret Hassell Done, FNP  albuterol (PROVENTIL) (2.5 MG/3ML)  0.083% nebulizer solution USE ONE VIAL IN NEBULIZER EVERY 6 HOURS AS NEEDED FOR WHEEZING AND FOR SHORTNESS OF BREATH 12/01/14  Yes Mary-Margaret Hassell Done, FNP  ALPRAZolam Duanne Moron) 0.5 MG tablet Take 1 tablet  (0.5 mg total) by mouth 2 (two) times daily. 07/17/15  Yes Mary-Margaret Hassell Done, FNP  amitriptyline (ELAVIL) 50 MG tablet Take 1 tablet by mouth at  bedtime 12/23/14  Yes Mary-Margaret Hassell Done, FNP  aspirin 325 MG tablet Take 325 mg by mouth daily.     Yes Historical Provider, MD  buPROPion (WELLBUTRIN XL) 150 MG 24 hr tablet Take 1 tablet by mouth  daily 10/04/15  Yes Mary-Margaret Hassell Done, FNP  cetirizine (ALL DAY ALLERGY) 10 MG tablet Take 10 mg by mouth daily.   Yes Historical Provider, MD  cholecalciferol (VITAMIN D) 1000 UNITS tablet Take 1,000 Units by mouth daily.   Yes Historical Provider, MD  EQL CALCIUM CITRATE/VITAMIN D PO Take by mouth. Vitamin D 500IU with Calcium Citrate '630mg'$    Yes Historical Provider, MD  Fluticasone Furoate-Vilanterol (BREO ELLIPTA) 100-25 MCG/INH AEPB Inhale 1 puff into the lungs daily.    Yes Historical Provider, MD  furosemide (LASIX) 20 MG tablet Take 1 tablet by mouth  daily 10/04/15  Yes Mary-Margaret Hassell Done, FNP  guaifenesin (MUCUS RELIEF) 400 MG TABS tablet Take 400 mg by mouth daily as needed (for ocngestion).   Yes Historical Provider, MD  HYDROcodone-acetaminophen (NORCO) 10-325 MG tablet Take 1 tablet by mouth every 8 (eight) hours as needed. Patient taking differently: Take 1 tablet by mouth every 8 (eight) hours as needed for moderate pain.  05/25/15  Yes Mary-Margaret Hassell Done, FNP  metoprolol (LOPRESSOR) 50 MG tablet Take 1 tablet by mouth two  times daily 10/04/15  Yes Mary-Margaret Hassell Done, FNP  tiotropium (SPIRIVA) 18 MCG inhalation capsule Place 1 capsule (18 mcg total) into inhaler and inhale every morning. 06/26/15  Yes Mary-Margaret Hassell Done, FNP     Allergies:  Allergies  Allergen Reactions  . Celexa [Citalopram Hydrobromide]     Jerky movements  . Nickel   . Tramadol Hcl     REACTION: nausea and vomiting    Social History:   reports that she quit smoking about 6 years ago. She has a 35.00 pack-year smoking history. She has never used smokeless  tobacco. She reports that she drinks alcohol. She reports that she does not use drugs.  Family History: Family History  Problem Relation Age of Onset  . Heart disease Father   . Hyperlipidemia Father   . Hypertension Father   . Heart attack Father   . Cancer Paternal Grandfather   . Alzheimer's disease Mother   . Diabetes Sister   . Hyperlipidemia Sister   . Hypertension Sister   . Heart attack Sister   . Peripheral vascular disease Sister   . Heart disease Brother   . Hyperlipidemia Brother   . Heart attack Brother      Physical Exam: Vitals:   10/24/15 0707 10/24/15 0732 10/24/15 1352 10/24/15 1429  BP: (!) 163/76  (!) 122/59   Pulse: 64  65   Resp: 20  18   Temp: 98.9 F (37.2 C)  98.3 F (36.8 C)   TempSrc: Oral  Oral   SpO2: 93% 95% 98% 96%  Weight: 40.8 kg (89 lb 14.4 oz)     Height:       Blood pressure (!) 122/59, pulse 65, temperature 98.3 F (36.8 C), temperature source Oral, resp. rate 18, height 5' (1.524 m), weight 40.8 kg (  89 lb 14.4 oz), SpO2 96 %.  GEN:  Pleasant patient lying in the stretcher in no acute distress; cooperative with exam. PSYCH:  alert and oriented x4; does not appear anxious or depressed; affect is appropriate. HEENT: Mucous membranes pink and anicteric; PERRLA; EOM intact; no cervical lymphadenopathy nor thyromegaly or carotid bruit; no JVD; There were no stridor. Neck is very supple. Breasts:: Not examined CHEST WALL: No tenderness CHEST: Normal respiration, clear to auscultation bilaterally.  HEART: Regular rate and rhythm.  There are no murmur, rub, or gallops.   BACK: No kyphosis or scoliosis; no CVA tenderness ABDOMEN: soft and non-tender; no masses, no organomegaly, normal abdominal bowel sounds; no pannus; no intertriginous candida. There is no rebound and no distention. Rectal Exam: Not done EXTREMITIES: No bone or joint deformity; age-appropriate arthropathy of the hands and knees; no edema; no ulcerations.  There is no calf  tenderness. Genitalia: not examined PULSES: 2+ and symmetric SKIN: Normal hydration no rash or ulceration CNS: Cranial nerves 2-12 grossly intact no focal lateralizing neurologic deficit.  Speech is fluent; uvula elevated with phonation, facial symmetry and tongue midline. DTR are normal bilaterally, cerebella exam is intact, barbinski is negative and strengths are equaled bilaterally.  No sensory loss.   Labs on Admission:  Basic Metabolic Panel:  Recent Labs Lab 10/18/15 0427 10/19/15 0420 10/19/15 0846 10/19/15 1652 10/20/15 0437 10/20/15 1707 10/21/15 0528 10/22/15 0600  NA 138 137  --   --  138  --  143  --   K 3.0* 3.1*  --   --  3.0*  --  3.2*  --   CL 94* 98*  --   --  104  --  102  --   CO2 33* 35*  --   --  32  --  36*  --   GLUCOSE 160* 144*  --   --  121*  --  71  --   BUN 37* 36*  --   --  40*  --  30*  --   CREATININE 0.99 0.89  --   --  0.76  --  0.69 0.76  CALCIUM 9.8 9.0  --   --  9.6  --  10.2  --   MG  --  1.4* 1.7 2.3 1.9 1.7  --   --   PHOS  --   --  2.9 3.2 2.3* 2.6  --   --    CBC:  Recent Labs Lab 10/18/15 0427 10/20/15 0437  WBC 9.9 12.3*  HGB 9.4* 9.5*  HCT 31.1* 29.9*  MCV 97.8 98.4  PLT 249 257    CBG:  Recent Labs Lab 10/23/15 1726 10/23/15 1728 10/24/15 0004 10/24/15 0702 10/24/15 1140  GLUCAP 105* 106* 123* 74 118*    Assessment/Plan Present on Admission: . Acute and chronic respiratory failure with hypercapnia ([place]) . COPD with acute exacerbation ([place]) . HTN (hypertension) . Anxiety . RA (rheumatoid arthritis) ([place]) . Chronic diastolic CHF (congestive heart failure) ([place]) . CAP (community acquired pneumonia) . Macrocytic anemia  PLAN:  COPD with acute on chronic respiratory failure:  She continue to do better,and her sputum grew pan sensitive pseudomonas, so antibiotics was changed to Ceftazidime and IV ZIthromax, now only [place] day 4. PICC will be placed, and she will be discharged to SNF when bed is available.  Will continue same.   Metabolic alkalosis post intubation: She was given IV diamox, and I think her pCO2 and Bicarb is at her baseline at  this time. She is doing well.   Chronic diastolic CHF: Compensated.  Hypokalemia: Still low today. Will continue to supplement. Will give K phos for low phosphorous as well.   Other plans as per orders. Code Status: FULL Haskel Khan, MD.  FACP [facility] Pager [phone] 7pm to 7am.  10/24/2015, 4:29 PM

## 2015-10-24 NOTE — Progress Notes (Signed)
Physical Therapy Treatment Patient Details Name: [name] MRN: [id number] DOB: September 05, 1945 Today's Date: 10/24/2015    History of Present Illness 70 yo F admitted 10/15/2015 with a couple days of progressive worsening dyspnea and new productive cough with thick green sputum.  Her symptoms progressively worsened and her husband reports that she "passed out" twice today. Patient denies passing out, reporting that she wasn't talking because she couldn't catch her breath.  Pt was intubated 10/16/2015 and successfully extubated on October 20, 2015.  Her sputum came back with pseudomonas pan sensitive.  Dx: pneumonia with COPD exacerbation and acute on chronic hypercarbic respiratory failure.  Pt continues to be tachycardic, and on BiPAP.  PMH: COPD on 3L/m [place] at baseline, chronic diastolic CHF, hypertension, rheumatoid arthritis, depression, anxiety, A-fib, AVN of the L hip, ETOH abuse, joint replacement.    PT Comments    Pt received in bed, and was agreeable to PT tx.  Pt continues to be confused, and highly distractable.  Pt demonstrated improvement with supine<>sit, however she continues to require Max A for sit<>stand with R LE demonstrating increased weakness compared to the left.  She was able to complete 5x sit<>stand and take a few lateral steps to the right, all with max A.  Continue to recommend SNF at this time due to cognitive and mobility deficits.   Follow Up Recommendations  SNF     Equipment Recommendations  None recommended by PT    Recommendations for Other Services Speech consult (cognitive evaluation)     Precautions / Restrictions Precautions Precautions: Fall Precaution Comments: Due to weakness.  Restrictions Weight Bearing Restrictions: No RLE Weight Bearing: Weight bearing as tolerated LLE Weight Bearing: Weight bearing as tolerated    Mobility  Bed Mobility Overal bed mobility: Needs Assistance Bed Mobility: Supine to Sit     Supine to sit: Min guard;HOB  elevated     General bed mobility comments: increased time.   Transfers Overall transfer level: Needs assistance Equipment used: Rolling walker (2 wheeled) Transfers: Sit to/from Stand Sit to Stand: Max assist;Mod assist (5x.  Pt required vc's and tc's for hand placement with each sit<>stand repetition.  ) Stand pivot transfers: Max assist       General transfer comment: Pt able to take 2 lateral steps to the right up towards the [facility], however PT had to manually advance R LE in the correct direction due to pt wanting to step fwd.   Ambulation/Gait                 Stairs            Wheelchair Mobility    Modified Rankin (Stroke Patients Only)       Balance   Sitting-balance support: Bilateral upper extremity supported;Feet supported Sitting balance-Leahy Scale: Fair     Standing balance support: Bilateral upper extremity supported Standing balance-Leahy Scale: Poor                      Cognition Arousal/Alertness: Awake/alert Behavior During Therapy: WFL for tasks assessed/performed Overall Cognitive Status: Impaired/Different from baseline Area of Impairment: Orientation;Safety/judgement;Problem solving Orientation Level: Disoriented to;Time;Place   Memory: Decreased recall of precautions;Decreased short-term memory   Safety/Judgement: Decreased awareness of safety;Decreased awareness of deficits   Problem Solving: Difficulty sequencing;Requires verbal cues;Requires tactile cues      Exercises General Exercises - Upper Extremity Shoulder Flexion: Strengthening;Both;10 reps;Seated;Limitations Shoulder Flexion Limitations: Pt only able to achieve ~100* flexion, and required Min guard due  to posterior LOB. General Exercises - Lower Extremity Long Arc Quad: Strengthening;Both;10 reps;Seated Hip Flexion/Marching: Strengthening;Both;10 reps;Seated    General Comments General comments (skin integrity, edema, etc.): Pt noted to have 2 fresh skin  tears - one on the anterior aspect of the L shin, and one on the L calf.  RN notified.      Pertinent Vitals/Pain Pain Assessment: No/denies pain    Home Living                      Prior Function            PT Goals (current goals can now be found in the care plan section) Acute Rehab PT Goals Patient Stated Goal: pt wants to get strong enough to go home.  PT Goal Formulation: With patient Time For Goal Achievement: 11/06/15 Potential to Achieve Goals: Fair Progress towards PT goals: Progressing toward goals    Frequency  Min 4X/week    PT Plan Current plan remains appropriate    Co-evaluation             End of Session Equipment Utilized During Treatment: Gait belt Activity Tolerance: Patient tolerated treatment well Patient left: in bed;with call bell/phone within reach;with bed alarm set     Time: 1345-1415 PT Time Calculation (min) (ACUTE ONLY): 30 min  Charges:  $Therapeutic Exercise: 8-22 mins $Therapeutic Activity: 8-22 mins                    G Codes:      Beth Nyelli Samara, PT, DPT X: [phone]

## 2015-10-24 NOTE — Consult Note (Signed)
   [facility] Inpatient Consult   10/24/2015  [name] 12-06-1945 [id number]  Patient screened for potential [place] Management services. Patient is eligible for [facility]. Electronic medical record reveals patient's discharge plan is for Skilled Nursing facility upon discharge, there were no identifiable [facility] care management needs at this time. [facility] Care Management services not appropriate at this time. If patient's post hospital needs change please place a [facility] Care Management consult.  For questions please contact:    Royetta Crochet. Laymond Purser, RN, BSN, [place] [phone]) Business Cell  [phone]) Toll Free Office

## 2015-10-24 NOTE — NC FL2 (Signed)
[place] LEVEL OF CARE SCREENING TOOL     IDENTIFICATION  Patient Name: [name] Birthdate: 21-May-1945 Sex: female Admission Date (Current Location): 10/15/2015  [facility] and [place] Number:  [employer] and Address:  [place] [address] [place]      Provider Number: [id number]  Attending Physician Name and Address:  Orvan Falconer, MD  Relative Name and Phone Number:       Current Level of Care: Hospital Recommended Level of Care: [place] Prior Approval Number:    Date Approved/Denied:   PASRR Number:    Discharge Plan: SNF    Current Diagnoses: Patient Active Problem List   Diagnosis Date Noted  . Protein-calorie malnutrition, severe 10/18/2015  . Acute and chronic respiratory failure with hypercapnia ([place]) 10/15/2015  . COPD with acute exacerbation ([place]) 10/15/2015  . Anxiety 10/15/2015  . Chronic diastolic CHF (congestive heart failure) ([place]) 10/15/2015  . CAP (community acquired pneumonia) 10/15/2015  . Macrocytic anemia 10/15/2015  . Acute respiratory failure with hypoxia and hypercapnia (HCC)   . Atherosclerotic peripheral vascular disease with intermittent claudication ([place]) 06/09/2014  . FTT (failure to thrive) in adult 07/31/2013  . CHF (congestive heart failure) ([place]) 06/10/2013  . 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 Osteopenia 06/16/2010  . Hyperlipidemia 06/16/2010  . HTN (hypertension) 06/16/2010  . Insomnia 06/16/2010  . RA (rheumatoid arthritis) ([place]) 06/16/2010  . RHEUMATIC FEVER, HX OF 04/20/2009  . COMPUTERIZED TOMOGRAPHY, CHEST, ABNORMAL 04/20/2009  . COPD bronchitis 04/18/2009    Orientation RESPIRATION BLADDER Height & Weight     Self  O2 (2.5) Incontinent Weight: 89 lb 14.4 oz (40.8 kg) Height:  5' (152.4 cm)  BEHAVIORAL SYMPTOMS/MOOD NEUROLOGICAL BOWEL NUTRITION STATUS      Incontinent Diet (Diet Soft)  AMBULATORY STATUS COMMUNICATION OF NEEDS Skin   Extensive  Assist Verbally Normal                       Personal Care Assistance Level of Assistance  Bathing, Feeding, Dressing Bathing Assistance: Maximum assistance Feeding assistance: Limited assistance Dressing Assistance: Maximum assistance     Functional Limitations Info  Sight, Hearing Sight Info: Adequate Hearing Info: Adequate Speech Info: Adequate    SPECIAL CARE FACTORS FREQUENCY  PT (By licensed PT)     PT Frequency: 5x/week              Contractures      Additional Factors Info  Code Status, Allergies, Psychotropic, Insulin Sliding Scale Code Status Info: Full Code Allergies Info: Celexa (Citalopram Hydrobromide), Nickel, Tramadol Hcl Psychotropic Info: Wellbutrin XL         Current Medications (10/24/2015):  This is the current hospital active medication list Current Facility-Administered Medications  Medication Dose Route Frequency Provider Last Rate Last Dose  . amitriptyline (ELAVIL) tablet 50 mg  50 mg Oral QHS Vianne Bulls, MD   50 mg at 10/23/15 2153  . antiseptic oral rinse (CPC / CETYLPYRIDINIUM CHLORIDE 0.05%) solution 7 mL  7 mL Mouth Rinse BID Orvan Falconer, MD   7 mL at 10/24/15 1000  . aspirin tablet 325 mg  325 mg Oral Daily Vianne Bulls, MD   325 mg at 10/24/15 1017  . buPROPion (WELLBUTRIN XL) 24 hr tablet 150 mg  150 mg Oral Daily Vianne Bulls, MD   150 mg at 10/24/15 1000  . cefTAZidime (FORTAZ) 2 g in dextrose 5 % 50 mL IVPB  2 g Intravenous Q12H Orvan Falconer, MD   2 g at 10/23/15 2152  . cholecalciferol (VITAMIN D) tablet 1,000 Units  1,000 Units Oral Daily Vianne Bulls, MD   1,000 Units at 10/24/15 1017  . enoxaparin (LOVENOX) injection 40 mg  40 mg Subcutaneous Q24H Ilene Qua Opyd, MD   40 mg at 10/24/15 1015  . famotidine (PEPCID) IVPB 20 mg premix  20 mg Intravenous Q12H Sinda Du, MD   20 mg at 10/23/15 2152  . feeding supplement (ENSURE ENLIVE) (ENSURE ENLIVE) liquid 237 mL  237 mL Oral BID BM Orvan Falconer, MD   237 mL at 10/24/15 1000   . fluticasone furoate-vilanterol (BREO ELLIPTA) 100-25 MCG/INH 1 puff  1 puff Inhalation Daily Vianne Bulls, MD   1 puff at 10/24/15 0729  . guaiFENesin (MUCINEX) 12 hr tablet 600 mg  600 mg Oral Daily PRN Erline Hau, MD      . HYDROcodone-acetaminophen [facility]) 10-325 MG per tablet 1 tablet  1 tablet Oral Q6H PRN Vianne Bulls, MD   1 tablet at 10/23/15 1842  . insulin aspart (novoLOG) injection 0-9 Units  0-9 Units Subcutaneous Q6H Phillips Grout, MD   1 Units at 10/24/15 0019  . ipratropium (ATROVENT) nebulizer solution 0.5 mg  0.5 mg Nebulization TID Orvan Falconer, MD   0.5 mg at 10/24/15 0729  . levalbuterol (XOPENEX) nebulizer solution 0.63 mg  0.63 mg Nebulization Q2H PRN Vianne Bulls, MD      . levalbuterol Penne Lash) nebulizer solution 0.63 mg  0.63 mg Nebulization TID Orvan Falconer, MD   0.63 mg at 10/24/15 0729  . loratadine (CLARITIN) tablet 10 mg  10 mg Oral Daily Vianne Bulls, MD   10 mg at 10/24/15 1017  . LORazepam (ATIVAN) injection 1 mg  1 mg Intravenous Q4H PRN Vianne Bulls, MD   1 mg at 10/23/15 2153  . metoprolol (LOPRESSOR) tablet 50 mg  50 mg Oral BID Vianne Bulls, MD   50 mg at 10/24/15 1016  . phosphorus (K PHOS NEUTRAL) tablet 500 mg  500 mg Oral BID Orvan Falconer, MD   500 mg at 10/24/15 1016  . potassium chloride SA (K-DUR,KLOR-CON) CR tablet 40 mEq  40 mEq Oral Daily Orvan Falconer, MD   40 mEq at 10/24/15 1016     Discharge Medications: Please see discharge summary for a list of discharge medications.  Relevant Imaging Results:  Relevant Lab Results:   Additional Information PATIENT WILL NEED 5 MORE DAYS OF IV ANTIBIOTICS (FORTOZ). PATIENT HAS A PICC LINE. PATIENT IS DISCHARGING TODAY.   Lauriel Helin, Clydene Pugh, LCSW

## 2015-10-25 DIAGNOSIS — D518 Other vitamin B12 deficiency anemias: Secondary | ICD-10-CM | POA: Diagnosis not present

## 2015-10-25 DIAGNOSIS — R279 Unspecified lack of coordination: Secondary | ICD-10-CM | POA: Diagnosis not present

## 2015-10-25 DIAGNOSIS — J189 Pneumonia, unspecified organism: Secondary | ICD-10-CM

## 2015-10-25 DIAGNOSIS — E58 Dietary calcium deficiency: Secondary | ICD-10-CM | POA: Diagnosis not present

## 2015-10-25 DIAGNOSIS — J449 Chronic obstructive pulmonary disease, unspecified: Secondary | ICD-10-CM | POA: Diagnosis not present

## 2015-10-25 DIAGNOSIS — R718 Other abnormality of red blood cells: Secondary | ICD-10-CM | POA: Diagnosis not present

## 2015-10-25 DIAGNOSIS — I5032 Chronic diastolic (congestive) heart failure: Secondary | ICD-10-CM | POA: Diagnosis not present

## 2015-10-25 DIAGNOSIS — E43 Unspecified severe protein-calorie malnutrition: Secondary | ICD-10-CM | POA: Diagnosis not present

## 2015-10-25 DIAGNOSIS — D539 Nutritional anemia, unspecified: Secondary | ICD-10-CM | POA: Diagnosis not present

## 2015-10-25 DIAGNOSIS — R4182 Altered mental status, unspecified: Secondary | ICD-10-CM | POA: Diagnosis not present

## 2015-10-25 DIAGNOSIS — J9622 Acute and chronic respiratory failure with hypercapnia: Secondary | ICD-10-CM

## 2015-10-25 DIAGNOSIS — M069 Rheumatoid arthritis, unspecified: Secondary | ICD-10-CM | POA: Diagnosis not present

## 2015-10-25 DIAGNOSIS — J441 Chronic obstructive pulmonary disease with (acute) exacerbation: Secondary | ICD-10-CM

## 2015-10-25 DIAGNOSIS — E559 Vitamin D deficiency, unspecified: Secondary | ICD-10-CM | POA: Diagnosis not present

## 2015-10-25 DIAGNOSIS — E7800 Pure hypercholesterolemia, unspecified: Secondary | ICD-10-CM | POA: Diagnosis not present

## 2015-10-25 DIAGNOSIS — J302 Other seasonal allergic rhinitis: Secondary | ICD-10-CM | POA: Diagnosis not present

## 2015-10-25 DIAGNOSIS — D649 Anemia, unspecified: Secondary | ICD-10-CM | POA: Diagnosis not present

## 2015-10-25 DIAGNOSIS — F419 Anxiety disorder, unspecified: Secondary | ICD-10-CM | POA: Diagnosis not present

## 2015-10-25 DIAGNOSIS — I110 Hypertensive heart disease with heart failure: Secondary | ICD-10-CM | POA: Diagnosis not present

## 2015-10-25 DIAGNOSIS — I4891 Unspecified atrial fibrillation: Secondary | ICD-10-CM | POA: Diagnosis not present

## 2015-10-25 DIAGNOSIS — M25552 Pain in left hip: Secondary | ICD-10-CM | POA: Diagnosis not present

## 2015-10-25 DIAGNOSIS — I509 Heart failure, unspecified: Secondary | ICD-10-CM | POA: Diagnosis not present

## 2015-10-25 DIAGNOSIS — R195 Other fecal abnormalities: Secondary | ICD-10-CM | POA: Diagnosis not present

## 2015-10-25 DIAGNOSIS — M25551 Pain in right hip: Secondary | ICD-10-CM | POA: Diagnosis not present

## 2015-10-25 DIAGNOSIS — W19XXXA Unspecified fall, initial encounter: Secondary | ICD-10-CM | POA: Diagnosis not present

## 2015-10-25 DIAGNOSIS — Z87891 Personal history of nicotine dependence: Secondary | ICD-10-CM | POA: Diagnosis not present

## 2015-10-25 DIAGNOSIS — Z79899 Other long term (current) drug therapy: Secondary | ICD-10-CM | POA: Diagnosis not present

## 2015-10-25 DIAGNOSIS — M6281 Muscle weakness (generalized): Secondary | ICD-10-CM | POA: Diagnosis not present

## 2015-10-25 DIAGNOSIS — D469 Myelodysplastic syndrome, unspecified: Secondary | ICD-10-CM | POA: Diagnosis not present

## 2015-10-25 DIAGNOSIS — R2681 Unsteadiness on feet: Secondary | ICD-10-CM | POA: Diagnosis not present

## 2015-10-25 DIAGNOSIS — Z7982 Long term (current) use of aspirin: Secondary | ICD-10-CM | POA: Diagnosis not present

## 2015-10-25 DIAGNOSIS — Z743 Need for continuous supervision: Secondary | ICD-10-CM | POA: Diagnosis not present

## 2015-10-25 DIAGNOSIS — J9620 Acute and chronic respiratory failure, unspecified whether with hypoxia or hypercapnia: Secondary | ICD-10-CM | POA: Diagnosis not present

## 2015-10-25 LAB — GLUCOSE, CAPILLARY
GLUCOSE-CAPILLARY: 96 mg/dL (ref 65–99)
GLUCOSE-CAPILLARY: 120 mg/dL — AB (ref 65–99)
Glucose-Capillary: 142 mg/dL — ABNORMAL HIGH (ref 65–99)

## 2015-10-25 MED ORDER — ALPRAZOLAM 0.5 MG PO TABS: 0.5000 mg | ORAL_TABLET | Freq: Two times a day (BID) | ORAL | 0 refills | Status: DC | PRN

## 2015-10-25 MED ORDER — BUPROPION HCL ER (XL) 150 MG PO TB24
150.0000 mg | ORAL_TABLET | Freq: Every day | ORAL | Status: DC
  Administered 2015-10-25: 150 mg via ORAL
  Filled 2015-10-25 (×3): qty 1

## 2015-10-25 MED ORDER — HYDROCODONE-ACETAMINOPHEN 10-325 MG PO TABS: 1.0000 | ORAL_TABLET | Freq: Three times a day (TID) | ORAL | 0 refills | Status: DC | PRN

## 2015-10-25 MED ORDER — LEVALBUTEROL HCL 0.63 MG/3ML IN NEBU: 0.6300 mg | INHALATION_SOLUTION | Freq: Three times a day (TID) | RESPIRATORY_TRACT | 12 refills | Status: DC

## 2015-10-25 MED ORDER — DEXTROSE 5 % IV SOLN: 2.0000 g | Freq: Two times a day (BID) | INTRAVENOUS | Status: DC

## 2015-10-25 NOTE — Clinical Social Work Note (Signed)
CSW spoke Larene Beach and Lattie Haw at [facility] and advised them of patient's discharge today.   CSW left a voicemail message for patient's son, Sherren Mocha, advising that patient was being discharged today and would be transported via [facility].  CSW arranged transportation with [facility].  CSW signing off.       Ihor Gully, [place] [phone]

## 2015-10-25 NOTE — Clinical Social Work Placement (Signed)
   CLINICAL SOCIAL WORK PLACEMENT  NOTE  Date:  10/25/2015  Patient Details  Name: [name] MRN: [id number] Date of Birth: July 03, 1945  Clinical Social Work is seeking post-discharge placement for this patient at the [place] level of care (*CSW will initial, date and re-position this form in  chart as items are completed):  Yes   Patient/family provided with [place] Work Department's list of facilities offering this level of care within the geographic area requested by the patient (or if unable, by the patient's family).  Yes   Patient/family informed of their freedom to choose among providers that offer the needed level of care, that participate in Medicare, Medicaid or managed care program needed by the patient, have an available bed and are willing to accept the patient.  Yes   Patient/family informed of [facility]'s ownership interest in [facility] and [facility], as well as of the fact that they are under no obligation to receive care at these facilities.  PASRR submitted to EDS on 10/24/15     PASRR number received on       Existing PASRR number confirmed on       FL2 transmitted to all facilities in geographic area requested by pt/family on       FL2 transmitted to all facilities within larger geographic area on       Patient informed that his/her managed care company has contracts with or will negotiate with certain facilities, including the following:            Patient/family informed of bed offers received.  Patient chooses bed at       Physician recommends and patient chooses bed at      Patient to be transferred to   on  .  Patient to be transferred to facility by       Patient family notified on   of transfer.  Name of family member notified:        PHYSICIAN       Additional Comment:    _______________________________________________ Ihor Gully, LCSW 10/25/2015, 11:53 AM

## 2015-10-25 NOTE — Progress Notes (Signed)
Pt's PICC line checked and clean dry and intact. Pt in stable condition and in no acute distress at time of discharge. Report called and given to [facility] [place], [place]). All questions were answered and no further questions at this time. Pt transported via [facility].

## 2015-10-25 NOTE — Discharge Summary (Addendum)
Physician Discharge Summary  [name] [id number] DOB: [birth year] DOA: 10/15/2015  PCP: Chevis Pretty, FNP  Admit date: 10/15/2015 Discharge date: 10/25/2015  Admitted From: Home  Disposition:  SNF   Recommendations for Outpatient Follow-up:  1. Follow up with PCP in 1-2 weeks 2. Please obtain BMP/CBC in one week  Home Health: No Equipment/Devices: No Discharge Condition:Stable  CODE STATUS:FULL Diet recommendation: Heart Healthy   Brief/Interim Summary: 70 year old woman with a hx of HTN,, chronic respiratory failure, COPD, chronic diastolic CHF, and HLD, was admitted for management of CAP. On 7/31, she was intubated due to unresponsiveness and pCO2 92. Pulmonology was consulted, and the patient was successfully extubated on 8/4. Sputum cultures positive for psuedomonas so antibiotics have been transitioned to IV fortaz and zithromax. Zithromax has since been discontinued. NGT tube has also been removed, and she is tolerating her diet, though she remains weak. Decision has been made for SNF placement on discharge. PICC line has been placed.   Discharge Diagnoses:  Principal Problem:   HCAP  Active Problems:   HTN (hypertension)   RA (rheumatoid arthritis) (HCC)   Acute and chronic respiratory failure with hypercapnia (HCC)   COPD with acute exacerbation (HCC)   Anxiety   Chronic diastolic CHF (congestive heart failure) (HCC)   Macrocytic anemia   Protein-calorie malnutrition, severe  Patient presented with complaints of dyspnea and cough. She has hx of chronic respiratory failure and COPD. She became increasingly lethargic with a rising PCO2 she ultimately recquired intubation and mechanical ventilation  Successfully extubated on 8/4 and has been stable since that time. Dr. Alva Garnet followed her in the hospital. Respiratory sinus appears to back at baseline. Pt was admitted for acute on chronic respiratory failure, secondary to COPD exacerbation. Sputum cultures  were positive for pan sensitive pseudomonas. Antibiotics have been transitioned to [place] and IV Zithromax on discharge.On discharge she was noted to have improved.  1. COPD with acute exacerbation. Baseline O2 requirement is 3L. NO evidence of wheezing. Treated with bronchodilators  2. Metabolic alkalosis s/p intubation. Given IV diamox. PCO2 and Bicarb remained at baseline. 3. HCAP. Resolved clinically. Initially treated with Rocephin and Zithromax. Sputum cultures positive for pan sensitive pseudomonas, so abx changed to Zithromax and [place]. Zithromax has been discontinued. PICC line placed. She is planned to complete 10 days  fortaz 4. Hypokalemia. Replaced. Magnesium within normal limits. 5. chroic diastolic CHF. Appears compensated at this point.  6. Macrocytic anemia. Hgb remained at baseline. 7. HTN. Continue antihypertensives. 8. Anxiety, depression  Discharge Instructions  Discharge Instructions    Diet - low sodium heart healthy    Complete by:  As directed   Increase activity slowly    Complete by:  As directed       Medication List    TAKE these medications   albuterol 108 (90 Base) MCG/ACT inhaler Commonly known as:  PROAIR HFA INHALE 2 PUFFS INTO THE LUNGS EVERY 6 HOURS AS NEEDED FOR WHEEZING OR SHORTNESS OF BREATH What changed:  Another medication with the same name was removed. Continue taking this medication, and follow the directions you see here.   ALL DAY ALLERGY 10 MG tablet Generic drug:  cetirizine Take 10 mg by mouth daily.   ALPRAZolam 0.5 MG tablet Commonly known as:  XANAX Take 1 tablet (0.5 mg total) by mouth 2 (two) times daily as needed for anxiety. What changed:  when to take this  reasons to take this   amitriptyline 50 MG tablet Commonly  known as:  ELAVIL Take 1 tablet by mouth at  bedtime   aspirin 325 MG tablet Take 325 mg by mouth daily.   BREO ELLIPTA 100-25 MCG/INH Aepb Generic drug:  fluticasone furoate-vilanterol Inhale 1 puff  into the lungs daily.   buPROPion 150 MG 24 hr tablet Commonly known as:  WELLBUTRIN XL Take 1 tablet by mouth  daily   cefTAZidime 2 g in dextrose 5 % 50 mL Inject 2 g into the vein every 12 (twelve) hours. For 5 more days   cholecalciferol 1000 units tablet Commonly known as:  VITAMIN D Take 1,000 Units by mouth daily.   EQL CALCIUM CITRATE/VITAMIN D PO Take by mouth. Vitamin D 500IU with Calcium Citrate 630mg    furosemide 20 MG tablet Commonly known as:  LASIX Take 1 tablet by mouth  daily   HUMIRA PEN 40 MG/0.8ML injection Generic drug:  adalimumab Inject 40 mg into the skin every 14 (fourteen) days.   HYDROcodone-acetaminophen 10-325 MG tablet Commonly known as:  NORCO Take 1 tablet by mouth every 8 (eight) hours as needed for moderate pain.   levalbuterol 0.63 MG/3ML nebulizer solution Commonly known as:  XOPENEX Take 3 mLs (0.63 mg total) by nebulization 3 (three) times daily.   metoprolol 50 MG tablet Commonly known as:  LOPRESSOR Take 1 tablet by mouth two  times daily   MUCUS RELIEF 400 MG Tabs tablet Generic drug:  guaifenesin Take 400 mg by mouth daily as needed (for ocngestion).   tiotropium 18 MCG inhalation capsule Commonly known as:  SPIRIVA Place 1 capsule (18 mcg total) into inhaler and inhale every morning.      Contact information for after-discharge care    Destination    [facility] .   Specialty:  [place] information: [place] [phone]             Allergies  Allergen Reactions  . Celexa [Citalopram Hydrobromide]     Jerky movements  . Nickel   . Tramadol Hcl     REACTION: nausea and vomiting    Consultations:  Pulmonology   PT   Procedures/Studies: Dg Chest Port 1 View  Result Date: 10/18/2015 CLINICAL DATA:  Respiratory failure EXAM: PORTABLE CHEST 1 VIEW COMPARISON:  10/17/2015 FINDINGS: Endotracheal tube in good position.  NG tube in  the stomach Fibro cavitary lesions in the apices bilaterally are stable. No air-fluid levels. Pleural thickening right lung base unchanged. No new area of infiltrate. Negative for heart failure. IMPRESSION: Endotracheal tube in good position.  No change from yesterday. Electronically Signed   By: Franchot Gallo M.D.   On: 10/18/2015 07:04   Dg Chest Port 1 View  Result Date: 10/17/2015 CLINICAL DATA:  Worsening shortness of breath and productive cough for 2 days. Acute respiratory failure. EXAM: PORTABLE CHEST 1 VIEW COMPARISON:  10/16/2015 FINDINGS: Endotracheal tube tip is not well seen. Enteric tube courses into the upper abdomen with tip not imaged. The cardiac silhouette is normal in size. Scarring and architectural distortion with volume loss are again seen in the upper lobes. More confluent opacity in the right upper lobe is unchanged, as is patchy right basilar opacity. There may be small pleural effusions. No pneumothorax is identified. 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 Electronically Signed   By: Logan Bores M.D.   On: 10/17/2015 07:21   Dg Chest [facility] 1 [address]  Result Date: 10/16/2015 CLINICAL DATA:  Hypoxia EXAM: PORTABLE CHEST 1 VIEW COMPARISON:  October 15, 2015 chest radiograph and chest CT March 31, 2014 FINDINGS: Endotracheal tube tip is 1.8 cm above the carina. Nasogastric tube tip and side port are below the diaphragm. No pneumothorax. There is scarring with volume loss in both upper lobes, slightly more severe on the right than on the left. There is also consolidation with effusion in the right base. No new opacity is evident. 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 There is atherosclerotic calcification in the aorta. No adenopathy evident. IMPRESSION: Persistent volume loss and fibrosis in the upper  lobes. Superimposed pneumonia in the upper lobes function on right, is of concern. There is patchy infiltrate in the right base with small right effusion. The overall appearance of the lungs is stable compared to 1 day prior. Tube positions are as described without pneumothorax. Cardiac silhouette is within normal limits and stable. Electronically Signed   By: Lowella Grip III M.D.   On: 10/16/2015 11:51  Dg Chest Port 1 View  Result Date: 10/15/2015 CLINICAL DATA:  Shortness of Breath EXAM: PORTABLE CHEST 1 VIEW COMPARISON:  03/31/2014 FINDINGS: Cardiac shadow is stable. Biapical scarring is again seen. Increased density is noted in the right apex. This is new from the prior exam and may represent acute on chronic infiltrate. Short-term follow-up is recommended. Chronic changes are seen in the right lung base. No acute bony abnormality is noted. IMPRESSION: Increased density in the right upper lobe likely representing acute on chronic infiltrate. Followup PA and lateral chest X-ray is recommended in 3-4 weeks following trial of antibiotic therapy to ensure resolution and exclude underlying malignancy. Electronically Signed   By: Inez Catalina M.D.   On: 10/15/2015 16:52  PICC line placement    Subjective: Doing well. Breathing has improved. Still coughing up sputum.   Discharge Exam: Vitals:   10/24/15 2100 10/25/15 0545  BP: (!) 124/57 122/61  Pulse: 89 88  Resp: 20 20  Temp: 98.5 F (36.9 C) 98.5 F (36.9 C)   Vitals:   10/24/15 2100 10/25/15 0500 10/25/15 0545 10/25/15 0715  BP: (!) 124/57  122/61   Pulse: 89  88   Resp: 20  20   Temp: 98.5 F (36.9 C)  98.5 F (36.9 C)   TempSrc: Oral  Oral   SpO2: 98%  97% 97%  Weight:  41.1 kg (90 lb 8 oz)    Height:        General: Pt is alert, awake, not in acute distress Cardiovascular: RRR, S1/S2 +, no rubs, no gallops Respiratory: Diminished breath sounds bilaterally , no wheezing, no rhonchi Abdominal: Soft, NT, ND, bowel  sounds + Extremities: no edema, no cyanosis    The results of significant diagnostics from this hospitalization (including imaging, microbiology, ancillary and laboratory) are listed below for reference.     Microbiology: Recent Results (from the past 240 hour(s))  MRSA PCR Screening     Status: None   Collection Time: 10/15/15  9:54 PM  Result Value Ref Range Status   MRSA by PCR NEGATIVE NEGATIVE Final    Comment:        The GeneXpert MRSA Assay (FDA approved for NASAL specimens only), is one component of a comprehensive MRSA colonization surveillance program. It is not intended to diagnose MRSA infection nor to guide or monitor treatment for MRSA infections.  Culture, expectorated sputum-assessment     Status: None   Collection Time: 10/16/15  1:43 PM  Result Value Ref Range Status   Specimen Description TRACHEAL ASPIRATE  Final   Special Requests NONE  Final   Sputum evaluation   Final    THIS SPECIMEN IS ACCEPTABLE. RESPIRATORY CULTURE REPORT TO FOLLOW. Performed at [facility]    Report Status 10/16/2015 FINAL  Final  Culture, respiratory (NON-Expectorated)     Status: None   Collection Time: 10/16/15  1:43 PM  Result Value Ref Range Status   Specimen Description TRACHEAL ASPIRATE  Final   Special Requests NONE  Final   Gram Stain   Final    ABUNDANT WBC PRESENT,BOTH PMN AND MONONUCLEAR NO ORGANISMS SEEN Performed at [place]  Final   Report Status 10/20/2015 FINAL  Final   Organism ID, Bacteria PSEUDOMONAS AERUGINOSA  Final      Susceptibility   Pseudomonas aeruginosa - MIC*    CEFTAZIDIME 2 SENSITIVE Sensitive     CIPROFLOXACIN <=0.25 SENSITIVE Sensitive     GENTAMICIN <=1 SENSITIVE Sensitive     IMIPENEM 2 SENSITIVE Sensitive     PIP/TAZO <=4 SENSITIVE Sensitive     CEFEPIME <=1 SENSITIVE Sensitive     * MODERATE PSEUDOMONAS AERUGINOSA  C difficile quick scan w PCR reflex     Status: None    Collection Time: 10/23/15  4:00 PM  Result Value Ref Range Status   C Diff antigen NEGATIVE NEGATIVE Final   C Diff toxin NEGATIVE NEGATIVE Final   C Diff interpretation No C. difficile detected.  Final     Labs: BNP (last 3 results)  Recent Labs  10/15/15 1629  BNP [id number]*   Basic Metabolic Panel:  Recent Labs Lab 10/19/15 0420 10/19/15 0846 10/19/15 1652 10/20/15 0437 10/20/15 1707 10/21/15 0528 10/22/15 0600  NA 137  --   --  138  --  143  --   K 3.1*  --   --  3.0*  --  3.2*  --   CL 98*  --   --  104  --  102  --   CO2 35*  --   --  32  --  36*  --   GLUCOSE 144*  --   --  121*  --  71  --   BUN 36*  --   --  40*  --  30*  --   CREATININE 0.89  --   --  0.76  --  0.69 0.76  CALCIUM 9.0  --   --  9.6  --  10.2  --   MG 1.4* 1.7 2.3 1.9 1.7  --   --   PHOS  --  2.9 3.2 2.3* 2.6  --   --    Liver Function Tests: No results for input(s): AST, ALT, ALKPHOS, BILITOT, PROT, ALBUMIN in the last 168 hours. No results for input(s): LIPASE, AMYLASE in the last 168 hours. No results for input(s): AMMONIA in the last 168 hours. CBC:  Recent Labs Lab 10/20/15 0437  WBC 12.3*  HGB 9.5*  HCT 29.9*  MCV 98.4  PLT 257   Cardiac Enzymes: No results for input(s): CKTOTAL, CKMB, CKMBINDEX, TROPONINI in the last 168 hours. BNP: Invalid input(s): POCBNP CBG:  Recent Labs Lab 10/24/15 1140 10/24/15 1648 10/25/15 0043 10/25/15 0543 10/25/15 1148  GLUCAP 118* 81 142* 96 120*   D-Dimer No results for input(s): DDIMER  in the last 72 hours. Hgb A1c No results for input(s): HGBA1C in the last 72 hours. Lipid Profile  Recent Labs  10/24/15 0533  TRIG 159*   Thyroid function studies No results for input(s): TSH, T4TOTAL, T3FREE, THYROIDAB in the last 72 hours.  Invalid input(s): FREET3 Anemia work up No results for input(s): VITAMINB12, FOLATE, FERRITIN, TIBC, IRON, RETICCTPCT in the last 72 hours. Urinalysis    Component Value Date/Time   COLORURINE  YELLOW 07/29/2013 1915   APPEARANCEUR CLEAR 07/29/2013 1915   LABSPEC 1.020 07/29/2013 1915   PHURINE 6.0 07/29/2013 1915   GLUCOSEU NEGATIVE 07/29/2013 1915   HGBUR SMALL (A) 07/29/2013 1915   BILIRUBINUR NEGATIVE 07/29/2013 [place] 07/29/2013 1915   PROTEINUR TRACE (A) 07/29/2013 1915   UROBILINOGEN 0.2 07/29/2013 1915   NITRITE NEGATIVE 07/29/2013 1915   LEUKOCYTESUR NEGATIVE 07/29/2013 1915   Sepsis Labs Invalid input(s): PROCALCITONIN,  WBC,  LACTICIDVEN Microbiology Recent Results (from the past 240 hour(s))  MRSA PCR Screening     Status: None   Collection Time: 10/15/15  9:54 PM  Result Value Ref Range Status   MRSA by PCR NEGATIVE NEGATIVE Final    Comment:        The GeneXpert MRSA Assay (FDA approved for NASAL specimens only), is one component of a comprehensive MRSA colonization surveillance program. It is not intended to diagnose MRSA infection nor to guide or monitor treatment for MRSA infections.   Culture, expectorated sputum-assessment     Status: None   Collection Time: 10/16/15  1:43 PM  Result Value Ref Range Status   Specimen Description TRACHEAL ASPIRATE  Final   Special Requests NONE  Final   Sputum evaluation   Final    THIS SPECIMEN IS ACCEPTABLE. RESPIRATORY CULTURE REPORT TO FOLLOW. Performed at [facility]    Report Status 10/16/2015 FINAL  Final  Culture, respiratory (NON-Expectorated)     Status: None   Collection Time: 10/16/15  1:43 PM  Result Value Ref Range Status   Specimen Description TRACHEAL ASPIRATE  Final   Special Requests NONE  Final   Gram Stain   Final    ABUNDANT WBC PRESENT,BOTH PMN AND MONONUCLEAR NO ORGANISMS SEEN Performed at [place]  Final   Report Status 10/20/2015 FINAL  Final   Organism ID, Bacteria PSEUDOMONAS AERUGINOSA  Final      Susceptibility   Pseudomonas aeruginosa - MIC*    CEFTAZIDIME 2 SENSITIVE Sensitive      CIPROFLOXACIN <=0.25 SENSITIVE Sensitive     GENTAMICIN <=1 SENSITIVE Sensitive     IMIPENEM 2 SENSITIVE Sensitive     PIP/TAZO <=4 SENSITIVE Sensitive     CEFEPIME <=1 SENSITIVE Sensitive     * MODERATE PSEUDOMONAS AERUGINOSA  C difficile quick scan w PCR reflex     Status: None   Collection Time: 10/23/15  4:00 PM  Result Value Ref Range Status   C Diff antigen NEGATIVE NEGATIVE Final   C Diff toxin NEGATIVE NEGATIVE Final   C Diff interpretation No C. difficile detected.  Final     Time coordinating discharge: Over 30 minutes  SIGNED:   Kathie Dike, MD [facility] 10/25/2015, 12:31 PM If 7PM-7AM, please contact night-coverage [email or web address] Password [email or web address] By signing my name below, I, Collene Leyden, attest that this documentation has been prepared under the direction and in the presence of Kathie Dike, MD. Electronically signed: Collene Leyden, Scribe. 10/25/15 12:20PM  I, Dr.  Kathie Dike, personally performed the services described in this documentaiton. All medical record entries made by the scribe were at my direction and in my presence. I have reviewed the chart and agree that the record reflects my personal performance and is accurate and complete  Kathie Dike, MD, 10/25/2015 12:31 PM

## 2015-10-25 NOTE — Clinical Social Work Placement (Signed)
   CLINICAL SOCIAL WORK PLACEMENT  NOTE  Date:  10/25/2015  Patient Details  Name: [name] MRN: [id number] Date of Birth: 1945/04/29  Clinical Social Work is seeking post-discharge placement for this patient at the [place] level of care (*CSW will initial, date and re-position this form in  chart as items are completed):  Yes   Patient/family provided with [place] Work Department's list of facilities offering this level of care within the geographic area requested by the patient (or if unable, by the patient's family).  Yes   Patient/family informed of their freedom to choose among providers that offer the needed level of care, that participate in Medicare, Medicaid or managed care program needed by the patient, have an available bed and are willing to accept the patient.  Yes   Patient/family informed of [place]'s ownership interest in [facility] and [facility] , as well as of the fact that they are under no obligation to receive care at these facilities.  PASRR submitted to EDS on 10/24/15     PASRR number received on 10/25/15     Existing PASRR number confirmed on       FL2 transmitted to all facilities in geographic area requested by pt/family on       FL2 transmitted to all facilities within larger geographic area on       Patient informed that his/her managed care company has contracts with or will negotiate with certain facilities, including the following:        Yes   Patient/family informed of bed offers received.  Patient chooses bed at [facility]     Physician recommends and patient chooses bed at      Patient to be transferred to [facility] on 10/25/15.  Patient to be transferred to facility by [facility]     Patient family notified on 10/25/15 of transfer.  Name of family member notified:  Carmin [place] "[facility]     PHYSICIAN       Additional Comment:     _______________________________________________ Ihor Gully, LCSW 10/25/2015, 1:42 PM

## 2015-10-25 NOTE — Clinical Social Work Note (Signed)
Clinical Social Work Assessment  Patient Details  Name: [name] MRN: [id number] Date of Birth: May 25, 1945  Date of referral:  10/24/15               Reason for consult:  Facility Placement                Permission sought to share information with:    Permission granted to share information::     Name::        Agency::     Relationship::     Contact Information:  Daughter, Clydene Laming and Husband, Mr. Hawkey both listed on the chart.  Housing/Transportation Living arrangements for the past 2 months:  [place] of Information:  Patient, Adult Children, Spouse Patient Interpreter Needed:  None Criminal Activity/Legal Involvement Pertinent to Current Situation/Hospitalization:  No - Comment as needed Significant Relationships:  Adult Children, Spouse Lives with:  Spouse Do you feel safe going back to the place where you live?  Yes Need for family participation in patient care:  Yes (Comment)  Care giving concerns:  Patient denies care giving needs.    Social Worker assessment / plan:  Patient requested that [place] speak with her husband and daughter. CSW provided patient a SNF listing. Patient's husband, Mr. Hoglen, requested that CSW speak with his daughter. CSW spoke with patient's daughter, Clydene Laming, and discussed SNF recommendation. Ms. Jerolyn Center stated that she lives next door to patient, however she feels SNF is greatly needed. She advised that she was interested in [facility], [facility] or [facility] [facility]. Ms. Jerolyn Center advised that patient uses a walker, cane or wheelchair, whichever she feels she needs at the time. She indicated patient's husband helps patient with ADLs.  Employment status:  Retired Nurse, adult PT Recommendations:  [place] / Referral to community resources:  [place]  Patient/Family's Response to care:  Family is agreeable to patient going to SNF.  Patient/Family's  Understanding of and Emotional Response to Diagnosis, Current Treatment, and Prognosis:  Patient's family understand patient's diagnosis, treatment and prognosis.   Emotional Assessment Appearance:  Appears stated age Attitude/Demeanor/Rapport:   (Cooperative) Affect (typically observed):  Calm, Accepting Orientation:  Oriented to Self Alcohol / Substance use:  Not Applicable Psych involvement (Current and /or in the community):  No (Comment)  Discharge Needs  Concerns to be addressed:  Discharge Planning Concerns Readmission within the last 30 days:  No Current discharge risk:  None Barriers to Discharge:  [place] ([place]) (30 day note, FL2 and H&P sent 10/25/15)   Ihor Gully, LCSW 10/25/2015, 11:24 AM

## 2015-10-25 NOTE — Clinical Social Work Placement (Signed)
   CLINICAL SOCIAL WORK PLACEMENT  NOTE  Date:  10/25/2015  Patient Details  Name: [name] MRN: [id number] Date of Birth: 06-Feb-1946  Clinical Social Work is seeking post-discharge placement for this patient at the [place] level of care (*CSW will initial, date and re-position this form in  chart as items are completed):  Yes   Patient/family provided with [place] Work Department's list of facilities offering this level of care within the geographic area requested by the patient (or if unable, by the patient's family).  Yes   Patient/family informed of their freedom to choose among providers that offer the needed level of care, that participate in Medicare, Medicaid or managed care program needed by the patient, have an available bed and are willing to accept the patient.  Yes   Patient/family informed of [place]'s ownership interest in [facility] and [facility], as well as of the fact that they are under no obligation to receive care at these facilities.  PASRR submitted to EDS on 10/24/15     PASRR number received on       Existing PASRR number confirmed on       FL2 transmitted to all facilities in geographic area requested by pt/family on       FL2 transmitted to all facilities within larger geographic area on       Patient informed that his/her managed care company has contracts with or will negotiate with certain facilities, including the following:        Yes   Patient/family informed of bed offers received.  Patient chooses bed at [facility]     Physician recommends and patient chooses bed at      Patient to be transferred to [facility] on  .  Patient to be transferred to facility by       Patient family notified on   of transfer.  Name of family member notified:        PHYSICIAN       Additional Comment:    _______________________________________________ Ihor Gully, LCSW 10/25/2015,  12:20 PM

## 2015-10-26 NOTE — Progress Notes (Signed)
Physical Therapy Treatment Patient Details Name: [name] MRN: [id number] DOB: 09-18-1945 Today's Date: 10/26/2015    History of Present Illness 70 yo F admitted 10/15/2015 with a couple days of progressive worsening dyspnea and new productive cough with thick green sputum.  Her symptoms progressively worsened and her husband reports that she "passed out" twice today. Patient denies passing out, reporting that she wasn't talking because she couldn't catch her breath.  Pt was intubated 10/16/2015 and successfully extubated on October 20, 2015.  Her sputum came back with pseudomonas pan sensitive.  Dx: pneumonia with COPD exacerbation and acute on chronic hypercarbic respiratory failure.  Pt continues to be tachycardic, and on BiPAP.  PMH: COPD on 3L/m [place] at baseline, chronic diastolic CHF, hypertension, rheumatoid arthritis, depression, anxiety, A-fib, AVN of the L hip, ETOH abuse, joint replacement.    PT Comments    Pt continues to require Max A for functional transfers, and is limited due to poor cognition and safety awareness.  Continue to recommend SNF for further PT to address strength, balance, and safety with mobility.   Follow Up Recommendations  SNF     Equipment Recommendations  None recommended by PT    Recommendations for Other Services       Precautions / Restrictions Precautions Precautions: Fall Precaution Comments: Due to weakness.  Restrictions Weight Bearing Restrictions: No RLE Weight Bearing: Weight bearing as tolerated LLE Weight Bearing: Weight bearing as tolerated    Mobility  Bed Mobility Overal bed mobility: Needs Assistance Bed Mobility: Supine to Sit     Supine to sit: Min guard;HOB elevated     General bed mobility comments: increased time.   Transfers Overall transfer level: Needs assistance Equipment used: Rolling walker (2 wheeled) Transfers: Stand Pivot Transfers;Sit to/from Stand Sit to Stand: Max assist;Mod assist Stand pivot  transfers: Max assist       General transfer comment: Pt was able to transfer onto the [facility], but requires total A for peri-hygiene.  Upon standing and transferring, pt has difficulty accepting weight on R LE and it essentially buckles, which requires Max A from PT to prevent falling.    Ambulation/Gait                 Stairs            Wheelchair Mobility    Modified Rankin (Stroke Patients Only)       Balance   Sitting-balance support: Bilateral upper extremity supported Sitting balance-Leahy Scale: Fair     Standing balance support: Bilateral upper extremity supported Standing balance-Leahy Scale: Poor                      Cognition Arousal/Alertness: Awake/alert Behavior During Therapy: WFL for tasks assessed/performed Overall Cognitive Status: Impaired/Different from baseline Area of Impairment: Orientation;Safety/judgement;Problem solving Orientation Level: Disoriented to;Time;Place   Memory: Decreased recall of precautions;Decreased short-term memory   Safety/Judgement: Decreased awareness of safety;Decreased awareness of deficits   Problem Solving: Difficulty sequencing;Requires verbal cues;Requires tactile cues General Comments: Pt is hyperverbal at times, and conversation is tangental.     Exercises      General Comments        Pertinent Vitals/Pain Pain Assessment: No/denies pain    Home Living                      Prior Function            PT Goals (current goals can now be found  in the care plan section) Acute Rehab PT Goals Patient Stated Goal: pt wants to get strong enough to go home.  PT Goal Formulation: With patient Time For Goal Achievement: 11/06/15 Potential to Achieve Goals: Fair Progress towards PT goals: Progressing toward goals    Frequency  Min 4X/week    PT Plan Current plan remains appropriate    Co-evaluation             End of Session Equipment Utilized During Treatment: Gait  belt Activity Tolerance: Patient tolerated treatment well Patient left: in bed;with call bell/phone within reach;with bed alarm set     Time:  1110- 1137  Total time: 27 min  Charges:   therapeutic activity 23-51min                    G Codes:      Jedi Catalfamo 2015-11-21, 8:48 AM

## 2015-10-27 MED FILL — Medication: Qty: 1 | Status: AC

## 2015-10-30 DIAGNOSIS — R195 Other fecal abnormalities: Secondary | ICD-10-CM | POA: Diagnosis not present

## 2015-10-30 DIAGNOSIS — I509 Heart failure, unspecified: Secondary | ICD-10-CM | POA: Diagnosis not present

## 2015-10-30 DIAGNOSIS — R718 Other abnormality of red blood cells: Secondary | ICD-10-CM | POA: Diagnosis not present

## 2015-10-30 DIAGNOSIS — D649 Anemia, unspecified: Secondary | ICD-10-CM | POA: Diagnosis not present

## 2015-11-02 ENCOUNTER — Telehealth: Payer: Self-pay | Admitting: Nurse Practitioner

## 2015-11-02 NOTE — Telephone Encounter (Signed)
Spoke with pt's husband regarding Xopenex Pt does not need to use both nebulizer medications He verbalizes understanding

## 2015-11-08 ENCOUNTER — Encounter (HOSPITAL_COMMUNITY): Payer: Self-pay

## 2015-11-08 ENCOUNTER — Emergency Department (HOSPITAL_COMMUNITY): Payer: Medicare Other

## 2015-11-08 ENCOUNTER — Emergency Department (HOSPITAL_COMMUNITY)
Admission: EM | Admit: 2015-11-08 | Discharge: 2015-11-08 | Disposition: A | Discharge status: Home / Self Care | Payer: Medicare Other | Attending: Emergency Medicine | Admitting: Emergency Medicine

## 2015-11-08 ENCOUNTER — Telehealth: Payer: Self-pay

## 2015-11-08 DIAGNOSIS — Z79899 Other long term (current) drug therapy: Secondary | ICD-10-CM | POA: Diagnosis not present

## 2015-11-08 DIAGNOSIS — I110 Hypertensive heart disease with heart failure: Secondary | ICD-10-CM | POA: Insufficient documentation

## 2015-11-08 DIAGNOSIS — I5032 Chronic diastolic (congestive) heart failure: Secondary | ICD-10-CM | POA: Insufficient documentation

## 2015-11-08 DIAGNOSIS — Z87891 Personal history of nicotine dependence: Secondary | ICD-10-CM | POA: Insufficient documentation

## 2015-11-08 DIAGNOSIS — D649 Anemia, unspecified: Secondary | ICD-10-CM

## 2015-11-08 DIAGNOSIS — I4891 Unspecified atrial fibrillation: Secondary | ICD-10-CM | POA: Diagnosis not present

## 2015-11-08 DIAGNOSIS — D469 Myelodysplastic syndrome, unspecified: Secondary | ICD-10-CM | POA: Diagnosis not present

## 2015-11-08 DIAGNOSIS — R4182 Altered mental status, unspecified: Secondary | ICD-10-CM

## 2015-11-08 DIAGNOSIS — J449 Chronic obstructive pulmonary disease, unspecified: Secondary | ICD-10-CM | POA: Insufficient documentation

## 2015-11-08 DIAGNOSIS — Z7982 Long term (current) use of aspirin: Secondary | ICD-10-CM | POA: Insufficient documentation

## 2015-11-08 LAB — CBC WITH DIFFERENTIAL/PLATELET
BASOS PCT: 0 %
Basophils Absolute: 0 10*3/uL (ref 0.0–0.1)
EOS ABS: 0.2 10*3/uL (ref 0.0–0.7)
EOS PCT: 3 %
HCT: 26.2 % — ABNORMAL LOW (ref 36.0–46.0)
HEMOGLOBIN: 7.8 g/dL — AB (ref 12.0–15.0)
Lymphocytes Relative: 26 %
Lymphs Abs: 1.7 10*3/uL (ref 0.7–4.0)
MCH: 28.8 pg (ref 26.0–34.0)
MCHC: 29.8 g/dL — AB (ref 30.0–36.0)
MCV: 96.7 fL (ref 78.0–100.0)
MONOS PCT: 13 %
Monocytes Absolute: 0.9 10*3/uL (ref 0.1–1.0)
NEUTROS PCT: 58 %
Neutro Abs: 3.8 10*3/uL (ref 1.7–7.7)
PLATELETS: 234 10*3/uL (ref 150–400)
RBC: 2.71 MIL/uL — ABNORMAL LOW (ref 3.87–5.11)
RDW: 14.5 % (ref 11.5–15.5)
WBC: 6.5 10*3/uL (ref 4.0–10.5)

## 2015-11-08 LAB — BASIC METABOLIC PANEL
Anion gap: 8 (ref 5–15)
BUN: 15 mg/dL (ref 6–20)
CALCIUM: 8.6 mg/dL — AB (ref 8.9–10.3)
CHLORIDE: 88 mmol/L — AB (ref 101–111)
CO2: 41 mmol/L — ABNORMAL HIGH (ref 22–32)
CREATININE: 0.86 mg/dL (ref 0.44–1.00)
Glucose, Bld: 84 mg/dL (ref 65–99)
Potassium: 3.7 mmol/L (ref 3.5–5.1)
SODIUM: 137 mmol/L (ref 135–145)

## 2015-11-08 LAB — PREPARE RBC (CROSSMATCH)

## 2015-11-08 LAB — RETICULOCYTES
RBC.: 2.71 MIL/uL — AB (ref 3.87–5.11)
RETIC CT PCT: 2.1 % (ref 0.4–3.1)
Retic Count, Absolute: 56.9 10*3/uL (ref 19.0–186.0)

## 2015-11-08 LAB — FOLATE: FOLATE: 25 ng/mL (ref >5.9)

## 2015-11-08 LAB — IRON AND TIBC
Iron: 32 ug/dL (ref 28–170)
Saturation Ratios: 17 % (ref 10.4–31.8)
TIBC: 189 ug/dL — ABNORMAL LOW (ref 250–450)
UIBC: 157 ug/dL

## 2015-11-08 LAB — ABO/RH: ABO/RH(D): A NEG

## 2015-11-08 LAB — VITAMIN B12: VITAMIN B 12: 390 pg/mL (ref 180–914)

## 2015-11-08 LAB — FERRITIN: Ferritin: 185 ng/mL (ref 11–307)

## 2015-11-08 MED ORDER — SODIUM CHLORIDE 0.9 % IV SOLN: Freq: Once | INTRAVENOUS | Status: DC

## 2015-11-08 NOTE — Telephone Encounter (Signed)
Husband contacted office asking her provider here Ronnald Collum to write an order for Home health and PT so he could take wife home from [facility]. He states that she has fallen there and he would much rather have her at home. I advised husband if he wished to take her home that he needs to speak with the social worker at [facility] first and let them know what his wishes are and see if they can help him with what he is trying to do. Patient verbalized understanding. Also advised patient if he needed further help to contact the office

## 2015-11-08 NOTE — ED Notes (Signed)
Unable to obtain occult blood due to no stool. MD made aware.

## 2015-11-08 NOTE — Discharge Instructions (Signed)
Patient with Hgb above transfusion threshold of 7.0 and no active bleeding.    Interval improvement in CXR suggesting no recurrent pneumonia.  Hemodynamically stable with no active, emergent medical conditions.  No changes to meds.  Please continue to check Hgb weekly or if bleeding is detected.  Watch out for rectal bleeding or black stools.  Consider GI appointment if anemia continues.

## 2015-11-08 NOTE — ED Notes (Signed)
Pt trying to climb out of bed.  Keeps putting legs through railing.  Attempted to redress skin tears on legs, pt began hitting and ripped them off again.  Cursing at nurse when attempting to intervene.  Legs back in railing and pt tried to kick.  Offered bedpan to which pt refused.

## 2015-11-08 NOTE — ED Provider Notes (Signed)
[facility] DEPT Provider Note   CSN: [id number] Arrival date & time: 11/08/15  1002     History   Chief Complaint Chief Complaint  Patient presents with  . Anemia    HPI [name] is a 70 y.o. female with history of COPD, CHF, and recent HCAP (requiring intubation and ICU care) sent to ED from SNF for Hgb 7.6.  Patient cannot provide history due to altered mental status.  Sent to ED from [facility] with concern for shortness of breath, anemia, and pallor.  She denies dyspnea, chest pain, fever/chills.  Thinks she is at [facility], cannot remember time or cause of last hospitalization.  She was noted to be AOx3 on 8/8 prior to discharge.  Aspirin 325 and no other blood thinners on EHR med list.  No med list received from [facility].  Recent admission for HCAP (pseudomonas), required intubation.  Discharged to SNF on 8/9 to complete antibiotic treatment with ceftazadime and azithromycin.   HPI  Past Medical History:  Diagnosis Date  . Anxiety disorder   . Atrial fibrillation ([place])   . Avascular necrosis of hip ([place])    left  . CHF (congestive heart failure) ([place])   . COPD (chronic obstructive pulmonary disease) ([place])   . ETOH abuse   . HTN (hypertension)   . Hyperglycemia   . On home O2    3L N/C  . Psoriasis     Patient Active Problem List   Diagnosis Date Noted  . Protein-calorie malnutrition, severe 10/18/2015  . Acute and chronic respiratory failure with hypercapnia ([place]) 10/15/2015  . COPD with acute exacerbation ([place]) 10/15/2015  . Anxiety 10/15/2015  . Chronic diastolic CHF (congestive heart failure) ([place]) 10/15/2015  . CAP (community acquired pneumonia) 10/15/2015  . Macrocytic anemia 10/15/2015  . Acute respiratory failure with hypoxia and hypercapnia (HCC)   . Atherosclerotic peripheral vascular disease with intermittent claudication ([place]) 06/09/2014  . FTT (failure to thrive) in adult 07/31/2013  . CHF (congestive heart failure) ([place])  06/10/2013  . 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 Osteopenia 06/16/2010  . Hyperlipidemia 06/16/2010  . HTN (hypertension) 06/16/2010  . Insomnia 06/16/2010  . RA (rheumatoid arthritis) ([place]) 06/16/2010  . RHEUMATIC FEVER, HX OF 04/20/2009  . COMPUTERIZED TOMOGRAPHY, CHEST, ABNORMAL 04/20/2009  . COPD bronchitis 04/18/2009    Past Surgical History:  Procedure Laterality Date  . JOINT REPLACEMENT    . TONSILLECTOMY    . TOTAL HIP ARTHROPLASTY  05-15-08   r hip    OB History    No data available       Home Medications    Prior to Admission medications   Medication Sig Start Date End Date Taking? Authorizing Provider  adalimumab (HUMIRA PEN) 40 MG/0.8ML injection Inject 40 mg into the skin every 14 (fourteen) days.     Historical Provider, MD  albuterol (PROAIR HFA) 108 (90 BASE) MCG/ACT inhaler INHALE 2 PUFFS INTO THE LUNGS EVERY 6 HOURS AS NEEDED FOR WHEEZING OR SHORTNESS OF BREATH 03/07/15   Mary-Margaret Hassell Done, FNP  ALPRAZolam Duanne Moron) 0.5 MG tablet Take 1 tablet (0.5 mg total) by mouth 2 (two) times daily as needed for anxiety. 10/25/15   Kathie Dike, MD  amitriptyline (ELAVIL) 50 MG tablet Take 1 tablet by mouth at  bedtime 12/23/14   Mary-Margaret Hassell Done, FNP  aspirin 325 MG tablet Take 325 mg by mouth daily.      Historical Provider, MD  buPROPion (WELLBUTRIN XL) 150 MG 24 hr tablet Take 1 tablet  by mouth  daily 10/04/15   Mary-Margaret Hassell Done, FNP  cefTAZidime 2 g in dextrose 5 % 50 mL Inject 2 g into the vein every 12 (twelve) hours. For 5 more days 10/25/15   Kathie Dike, MD  cetirizine (ALL DAY ALLERGY) 10 MG tablet Take 10 mg by mouth daily.    Historical Provider, MD  cholecalciferol (VITAMIN D) 1000 UNITS tablet Take 1,000 Units by mouth daily.    Historical Provider, MD  EQL CALCIUM CITRATE/VITAMIN D PO Take by mouth. Vitamin D 500IU with Calcium Citrate 630mg     Historical Provider, MD  Fluticasone Furoate-Vilanterol (BREO ELLIPTA) 100-25 MCG/INH AEPB Inhale 1 puff  into the lungs daily.     Historical Provider, MD  furosemide (LASIX) 20 MG tablet Take 1 tablet by mouth  daily 10/04/15   Mary-Margaret Hassell Done, FNP  guaifenesin (MUCUS RELIEF) 400 MG TABS tablet Take 400 mg by mouth daily as needed (for ocngestion).    Historical Provider, MD  HYDROcodone-acetaminophen (NORCO) 10-325 MG tablet Take 1 tablet by mouth every 8 (eight) hours as needed for moderate pain. 10/25/15   Kathie Dike, MD  levalbuterol (XOPENEX) 0.63 MG/3ML nebulizer solution Take 3 mLs (0.63 mg total) by nebulization 3 (three) times daily. 10/25/15   Kathie Dike, MD  metoprolol (LOPRESSOR) 50 MG tablet Take 1 tablet by mouth two  times daily 10/04/15   Mary-Margaret Hassell Done, FNP  tiotropium (SPIRIVA) 18 MCG inhalation capsule Place 1 capsule (18 mcg total) into inhaler and inhale every morning. 06/26/15   Mary-Margaret Hassell Done, FNP    Family History Family History  Problem Relation Age of Onset  . Heart disease Father   . Hyperlipidemia Father   . Hypertension Father   . Heart attack Father   . Cancer Paternal Grandfather   . Alzheimer's disease Mother   . Diabetes Sister   . Hyperlipidemia Sister   . Hypertension Sister   . Heart attack Sister   . Peripheral vascular disease Sister   . Heart disease Brother   . Hyperlipidemia Brother   . Heart attack Brother     Social History Social History  Substance Use Topics  . Smoking status: Former Smoker    Packs/day: 1.00    Years: 35.00    Quit date: 02/15/2009  . Smokeless tobacco: Never Used  . Alcohol use 0.0 oz/week     Comment: hx of abuse and withdrawal     Allergies   Celexa [citalopram hydrobromide]; Nickel; and Tramadol hcl   Review of Systems Review of Systems  Limited by AMS.  Denies pain, SOB, bleeding.   Physical Exam Updated Vital Signs BP 126/78 (BP Location: Right Arm)   Pulse 77   Temp 98.1 F (36.7 C) (Oral)   Resp 15   Ht 5\' 2"  (1.575 m)   Wt 38.6 kg   SpO2 96%   BMI 15.55 kg/m   Physical  Exam  Constitutional:  Elderly, chronically ill appearing woman in no apparent distress  HENT:  Head: Normocephalic and atraumatic.  Dry oral MM.  No exudate.  Eyes: Conjunctivae are normal. Pupils are equal, round, and reactive to light. No scleral icterus.  Neck: No JVD present.  Cardiovascular: Normal rate, regular rhythm and normal heart sounds.   Pulmonary/Chest: No stridor.  Reduced breath sounds LLL, occasional diffuse wheeze, no crackles.  No increased WOB or respiratory distress.  Abdominal: Soft. She exhibits no distension. There is no tenderness.  Musculoskeletal: She exhibits no edema, tenderness or deformity.  Lymphadenopathy:  She has no cervical adenopathy.  Neurological: No cranial nerve deficit.  AO x 1-2  Skin: Skin is warm and dry.  Diffuse large ecchymoses over lower legs  Psychiatric:  Calm, cooperative.  Tangential, impoverished speech content.     ED Treatments / Results  Labs (all labs ordered are listed, but only abnormal results are displayed) Labs Reviewed  CBC WITH DIFFERENTIAL/PLATELET - Abnormal; Notable for the following:       Result Value   RBC 2.71 (*)    Hemoglobin 7.8 (*)    HCT 26.2 (*)    MCHC 29.8 (*)    All other components within normal limits  BASIC METABOLIC PANEL - Abnormal; Notable for the following:    Chloride 88 (*)    CO2 41 (*)    Calcium 8.6 (*)    All other components within normal limits  RETICULOCYTES - Abnormal; Notable for the following:    RBC. 2.71 (*)    All other components within normal limits  URINE CULTURE  VITAMIN B12  FOLATE  IRON AND TIBC  FERRITIN  URINALYSIS, ROUTINE W REFLEX MICROSCOPIC (NOT AT [facility])  POC OCCULT BLOOD, ED  ABO/RH  TYPE AND SCREEN  PREPARE RBC (CROSSMATCH)    EKG  EKG Interpretation None       Radiology Dg Chest 2 View  Result Date: 11/08/2015 CLINICAL DATA:  Low hemoglobin. EXAM: CHEST  2 VIEW COMPARISON:  10/18/2015 and 10/17/2015 radiographs.  CT 03/31/2014.  FINDINGS: The endotracheal tube and nasogastric tube have been removed. There is a right arm PICC which projects to the SVC right atrial level. The heart size and mediastinal contours are stable. There is extensive chronic lung disease with biapical volume loss and fibro cavitary disease. This superimposed right apical density seen on the recent studies has improved. No progressive airspace disease or significant pleural effusion identified. No acute osseous findings are seen. There is probable hydroxyapatite deposition in the region of the rotator cuff bilaterally. IMPRESSION: Interval improvement in right apical density, likely representing partial treatment of superimposed acute inflammation. Underlying extensive chronic lung disease grossly stable. Electronically Signed   By: Richardean Sale M.D.   On: 11/08/2015 12:10    Procedures Procedures (including critical care time)  Medications Ordered in ED Medications  0.9 %  sodium chloride infusion (not administered)     Initial Impression / Assessment and Plan / ED Course  I have reviewed the triage vital signs and the nursing notes.  Pertinent labs & imaging results that were available during my care of the patient were reviewed by me and considered in my medical decision making (see chart for details).  Elderly patient from SNF with recent ICU stay for pseudomonal HCAP now with anemia and AMS.  Does not endorse SOB. Not on blood thinners, no known GI bleeding but unable to obtain history stool for FOBT.  Recurrent or new infection is a consideration.  Possibilities for anemia could be chronic disease, blood loss/iron deficiency, or folate/B12.   Clinical Course  Comment By Time  Hgb 7.8, normocytic Minus Liberty, MD 08/23 1130  Interval improvement in CXR Minus Liberty, MD 08/23 1240  Reticulocyte Index 0.6%, inadequate response Minus Liberty, MD 08/23 1241  Unable to obtain stool sample for FOBT Minus Liberty, MD 08/23  1243  Discussed admission with Dr Jerilee Hoh.  Declined to evaluate at this time since patient is above transfusion threshold.  Will attempt to obtain more information re: baseline MS from SNF. Minus Liberty, MD 08/23  1320  Talked to Hermine Messick, Market researcher at [employer].  Mental status at baseline.  Hgb with slow downtrend over past weeks there. Minus Liberty, MD 08/23 1350  Spoke to Dr Sunday Corn MD, about plan to return pt to SNF without transfusion.  He is agreeable to that plan. Minus Liberty, MD 08/23 1406     Final Clinical Impressions(s) / ED Diagnoses   Final diagnoses:  Anemia, unspecified anemia type  Altered mental status, unspecified altered mental status type   Mental status at baseline, otherwise asymptomatic, hemodynamically stable, with no active bleeding.  Anemia, possibly chronic disease or iron deficiency, but above transfusion threshold.  Recommend following Hgb at SNF, consider GI appointment if suspicion of GI bleed, or return to ED if anemia worsens or becomes symptomatic.  New Prescriptions New Prescriptions   No medications on file     Minus Liberty, MD 11/08/15 Cook, MD 11/08/15 1504

## 2015-11-08 NOTE — ED Notes (Signed)
Asssited patient from bedpan. Perineal care performed with Teodoro Kil, CNA.

## 2015-11-08 NOTE — ED Triage Notes (Signed)
Patient from [facility] via [facility], for HGB 7.6, patient is pale, on oxygen at facility, room air sat 84%, O2 @ 2lpm increases O2 sat to 96%. Pt denies known source of blood loss. History of transfusion in the past. RUE PICC Line.

## 2015-11-08 NOTE — ED Notes (Signed)
Notified by lab blood ready.  Dr. Dayna Barker aware no order to transfuse at this time.  Verbal consent obtained via phone from husband witnessed by Alinda Money RN.

## 2015-11-08 NOTE — ED Notes (Signed)
Pt assisted with bed pan

## 2015-11-08 NOTE — ED Notes (Signed)
Spoke with [facility] regarding plan for pt to return and informing them transport needed.

## 2015-11-12 LAB — TYPE AND SCREEN
ABO/RH(D): A NEG
ANTIBODY SCREEN: NEGATIVE
Unit division: 0
Unit division: 0

## 2015-11-13 ENCOUNTER — Other Ambulatory Visit (HOSPITAL_COMMUNITY): Payer: Self-pay | Admitting: Internal Medicine

## 2015-11-13 DIAGNOSIS — R4189 Other symptoms and signs involving cognitive functions and awareness: Secondary | ICD-10-CM

## 2015-11-13 DIAGNOSIS — W19XXXA Unspecified fall, initial encounter: Secondary | ICD-10-CM

## 2015-11-16 DIAGNOSIS — I739 Peripheral vascular disease, unspecified: Secondary | ICD-10-CM | POA: Diagnosis not present

## 2015-11-16 DIAGNOSIS — L89620 Pressure ulcer of left heel, unstageable: Secondary | ICD-10-CM | POA: Diagnosis not present

## 2015-11-16 DIAGNOSIS — Z7982 Long term (current) use of aspirin: Secondary | ICD-10-CM | POA: Diagnosis not present

## 2015-11-16 DIAGNOSIS — D539 Nutritional anemia, unspecified: Secondary | ICD-10-CM | POA: Diagnosis not present

## 2015-11-16 DIAGNOSIS — I5032 Chronic diastolic (congestive) heart failure: Secondary | ICD-10-CM | POA: Diagnosis not present

## 2015-11-16 DIAGNOSIS — R627 Adult failure to thrive: Secondary | ICD-10-CM | POA: Diagnosis not present

## 2015-11-16 DIAGNOSIS — Z4800 Encounter for change or removal of nonsurgical wound dressing: Secondary | ICD-10-CM | POA: Diagnosis not present

## 2015-11-16 DIAGNOSIS — I110 Hypertensive heart disease with heart failure: Secondary | ICD-10-CM | POA: Diagnosis not present

## 2015-11-16 DIAGNOSIS — S81811D Laceration without foreign body, right lower leg, subsequent encounter: Secondary | ICD-10-CM | POA: Diagnosis not present

## 2015-11-16 DIAGNOSIS — J441 Chronic obstructive pulmonary disease with (acute) exacerbation: Secondary | ICD-10-CM | POA: Diagnosis not present

## 2015-11-16 DIAGNOSIS — J9622 Acute and chronic respiratory failure with hypercapnia: Secondary | ICD-10-CM | POA: Diagnosis not present

## 2015-11-16 DIAGNOSIS — M069 Rheumatoid arthritis, unspecified: Secondary | ICD-10-CM | POA: Diagnosis not present

## 2015-11-16 DIAGNOSIS — E43 Unspecified severe protein-calorie malnutrition: Secondary | ICD-10-CM | POA: Diagnosis not present

## 2015-11-21 DIAGNOSIS — Z4800 Encounter for change or removal of nonsurgical wound dressing: Secondary | ICD-10-CM | POA: Diagnosis not present

## 2015-11-21 DIAGNOSIS — J441 Chronic obstructive pulmonary disease with (acute) exacerbation: Secondary | ICD-10-CM | POA: Diagnosis not present

## 2015-11-21 DIAGNOSIS — Z7982 Long term (current) use of aspirin: Secondary | ICD-10-CM | POA: Diagnosis not present

## 2015-11-21 DIAGNOSIS — M069 Rheumatoid arthritis, unspecified: Secondary | ICD-10-CM | POA: Diagnosis not present

## 2015-11-21 DIAGNOSIS — I110 Hypertensive heart disease with heart failure: Secondary | ICD-10-CM | POA: Diagnosis not present

## 2015-11-21 DIAGNOSIS — S81811D Laceration without foreign body, right lower leg, subsequent encounter: Secondary | ICD-10-CM | POA: Diagnosis not present

## 2015-11-21 DIAGNOSIS — L89620 Pressure ulcer of left heel, unstageable: Secondary | ICD-10-CM | POA: Diagnosis not present

## 2015-11-21 DIAGNOSIS — E43 Unspecified severe protein-calorie malnutrition: Secondary | ICD-10-CM | POA: Diagnosis not present

## 2015-11-21 DIAGNOSIS — J9622 Acute and chronic respiratory failure with hypercapnia: Secondary | ICD-10-CM | POA: Diagnosis not present

## 2015-11-21 DIAGNOSIS — R627 Adult failure to thrive: Secondary | ICD-10-CM | POA: Diagnosis not present

## 2015-11-21 DIAGNOSIS — D539 Nutritional anemia, unspecified: Secondary | ICD-10-CM | POA: Diagnosis not present

## 2015-11-21 DIAGNOSIS — I739 Peripheral vascular disease, unspecified: Secondary | ICD-10-CM | POA: Diagnosis not present

## 2015-11-21 DIAGNOSIS — I5032 Chronic diastolic (congestive) heart failure: Secondary | ICD-10-CM | POA: Diagnosis not present

## 2015-11-22 DIAGNOSIS — I739 Peripheral vascular disease, unspecified: Secondary | ICD-10-CM | POA: Diagnosis not present

## 2015-11-22 DIAGNOSIS — I5032 Chronic diastolic (congestive) heart failure: Secondary | ICD-10-CM | POA: Diagnosis not present

## 2015-11-22 DIAGNOSIS — E43 Unspecified severe protein-calorie malnutrition: Secondary | ICD-10-CM | POA: Diagnosis not present

## 2015-11-22 DIAGNOSIS — J441 Chronic obstructive pulmonary disease with (acute) exacerbation: Secondary | ICD-10-CM | POA: Diagnosis not present

## 2015-11-22 DIAGNOSIS — D539 Nutritional anemia, unspecified: Secondary | ICD-10-CM | POA: Diagnosis not present

## 2015-11-22 DIAGNOSIS — L89620 Pressure ulcer of left heel, unstageable: Secondary | ICD-10-CM | POA: Diagnosis not present

## 2015-11-22 DIAGNOSIS — J9622 Acute and chronic respiratory failure with hypercapnia: Secondary | ICD-10-CM | POA: Diagnosis not present

## 2015-11-22 DIAGNOSIS — Z7982 Long term (current) use of aspirin: Secondary | ICD-10-CM | POA: Diagnosis not present

## 2015-11-22 DIAGNOSIS — Z4800 Encounter for change or removal of nonsurgical wound dressing: Secondary | ICD-10-CM | POA: Diagnosis not present

## 2015-11-22 DIAGNOSIS — R627 Adult failure to thrive: Secondary | ICD-10-CM | POA: Diagnosis not present

## 2015-11-22 DIAGNOSIS — I110 Hypertensive heart disease with heart failure: Secondary | ICD-10-CM | POA: Diagnosis not present

## 2015-11-22 DIAGNOSIS — S81811D Laceration without foreign body, right lower leg, subsequent encounter: Secondary | ICD-10-CM | POA: Diagnosis not present

## 2015-11-22 DIAGNOSIS — M069 Rheumatoid arthritis, unspecified: Secondary | ICD-10-CM | POA: Diagnosis not present

## 2015-11-23 ENCOUNTER — Encounter: Payer: Self-pay | Admitting: Nurse Practitioner

## 2015-11-23 ENCOUNTER — Ambulatory Visit (INDEPENDENT_AMBULATORY_CARE_PROVIDER_SITE_OTHER): Payer: Medicare Other | Admitting: Nurse Practitioner

## 2015-11-23 VITALS — BP 106/58 | HR 85 | Temp 97.7°F

## 2015-11-23 DIAGNOSIS — J449 Chronic obstructive pulmonary disease, unspecified: Secondary | ICD-10-CM

## 2015-11-23 DIAGNOSIS — J9622 Acute and chronic respiratory failure with hypercapnia: Secondary | ICD-10-CM | POA: Diagnosis not present

## 2015-11-23 DIAGNOSIS — M549 Dorsalgia, unspecified: Secondary | ICD-10-CM | POA: Diagnosis not present

## 2015-11-23 DIAGNOSIS — I70219 Atherosclerosis of native arteries of extremities with intermittent claudication, unspecified extremity: Secondary | ICD-10-CM

## 2015-11-23 DIAGNOSIS — E785 Hyperlipidemia, unspecified: Secondary | ICD-10-CM | POA: Diagnosis not present

## 2015-11-23 DIAGNOSIS — I509 Heart failure, unspecified: Secondary | ICD-10-CM | POA: Diagnosis not present

## 2015-11-23 DIAGNOSIS — F419 Anxiety disorder, unspecified: Secondary | ICD-10-CM

## 2015-11-23 DIAGNOSIS — I739 Peripheral vascular disease, unspecified: Secondary | ICD-10-CM

## 2015-11-23 DIAGNOSIS — F411 Generalized anxiety disorder: Secondary | ICD-10-CM

## 2015-11-23 DIAGNOSIS — R627 Adult failure to thrive: Secondary | ICD-10-CM

## 2015-11-23 DIAGNOSIS — G8929 Other chronic pain: Secondary | ICD-10-CM | POA: Diagnosis not present

## 2015-11-23 DIAGNOSIS — IMO0001 Reserved for inherently not codable concepts without codable children: Secondary | ICD-10-CM

## 2015-11-23 DIAGNOSIS — G4700 Insomnia, unspecified: Secondary | ICD-10-CM

## 2015-11-23 MED ORDER — ALPRAZOLAM 0.5 MG PO TABS: 0.5000 mg | ORAL_TABLET | Freq: Two times a day (BID) | ORAL | 1 refills | Status: DC | PRN

## 2015-11-23 MED ORDER — HYDROCODONE-ACETAMINOPHEN 10-325 MG PO TABS: 1.0000 | ORAL_TABLET | Freq: Two times a day (BID) | ORAL | 0 refills | Status: DC

## 2015-11-23 NOTE — Progress Notes (Signed)
Subjective:    Patient ID: [name], female    DOB: 01-09-1946, 70 y.o.   MRN: [id number]  HPI   Patient in today for nursing home follow up- she was taken to ER with what she thought was a resp infection and they admitted her for several days an dthen admitted her to nursing home for several days. No changes made to medications. Her only complaint is constant hip and back painthat she has had for years.   Patient Active Problem List   Diagnosis Date Noted  . Protein-calorie malnutrition, severe 10/18/2015  . Acute and chronic respiratory failure with hypercapnia ([place]) 10/15/2015  . COPD with acute exacerbation ([place]) 10/15/2015  . Anxiety 10/15/2015  . Chronic diastolic CHF (congestive heart failure) ([place]) 10/15/2015  . CAP (community acquired pneumonia) 10/15/2015  . Macrocytic anemia 10/15/2015  . Acute respiratory failure with hypoxia and hypercapnia (HCC)   . Atherosclerotic peripheral vascular disease with intermittent claudication ([place]) 06/09/2014  . FTT (failure to thrive) in adult 07/31/2013  . CHF (congestive heart failure) ([place]) 06/10/2013  . 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 Osteopenia 06/16/2010  . Hyperlipidemia 06/16/2010  . HTN (hypertension) 06/16/2010  . Insomnia 06/16/2010  . RA (rheumatoid arthritis) ([place]) 06/16/2010  . RHEUMATIC FEVER, HX OF 04/20/2009  . COMPUTERIZED TOMOGRAPHY, CHEST, ABNORMAL 04/20/2009  . COPD bronchitis 04/18/2009   Outpatient Encounter Prescriptions as of 11/23/2015  Medication Sig  . adalimumab (HUMIRA PEN) 40 MG/0.8ML injection Inject 40 mg into the skin every 14 (fourteen) days.   Marland Kitchen albuterol (PROAIR HFA) 108 (90 BASE) MCG/ACT inhaler INHALE 2 PUFFS INTO THE LUNGS EVERY 6 HOURS AS NEEDED FOR WHEEZING OR SHORTNESS OF BREATH  . ALPRAZolam (XANAX) 0.5 MG tablet Take 1 tablet (0.5 mg total) by mouth 2 (two) times daily as needed for anxiety.  Marland Kitchen amitriptyline (ELAVIL) 50 MG tablet Take 1 tablet by mouth at  bedtime  . buPROPion  (WELLBUTRIN XL) 150 MG 24 hr tablet Take 1 tablet by mouth  daily  . cetirizine (ALL DAY ALLERGY) 10 MG tablet Take 10 mg by mouth daily.  . cholecalciferol (VITAMIN D) 1000 UNITS tablet Take 1,000 Units by mouth daily.  Marland Kitchen EQL CALCIUM CITRATE/VITAMIN D PO Take by mouth. Vitamin D 500IU with Calcium Citrate '630mg'$   . Fluticasone Furoate-Vilanterol (BREO ELLIPTA) 100-25 MCG/INH AEPB Inhale 1 puff into the lungs daily.   . furosemide (LASIX) 20 MG tablet Take 1 tablet by mouth  daily  . HYDROcodone-acetaminophen (NORCO) 10-325 MG tablet Take 1 tablet by mouth every 8 (eight) hours as needed for moderate pain.  . metoprolol (LOPRESSOR) 50 MG tablet Take 1 tablet by mouth two  times daily  . aspirin 325 MG tablet Take 325 mg by mouth daily.    . cefTAZidime 2 g in dextrose 5 % 50 mL Inject 2 g into the vein every 12 (twelve) hours. For 5 more days (Patient not taking: Reported on 11/23/2015)  . guaifenesin (MUCUS RELIEF) 400 MG TABS tablet Take 400 mg by mouth daily as needed (for ocngestion).  Marland Kitchen levalbuterol (XOPENEX) 0.63 MG/3ML nebulizer solution Take 3 mLs (0.63 mg total) by nebulization 3 (three) times daily. (Patient not taking: Reported on 11/23/2015)  . tiotropium (SPIRIVA) 18 MCG inhalation capsule Place 1 capsule (18 mcg total) into inhaler and inhale every morning. (Patient not taking: Reported on 11/23/2015)   No facility-administered encounter medications on file as of 11/23/2015.       Review of Systems  Constitutional: Positive for appetite  change (decreaed) and unexpected weight change (major weight loss).  HENT: Negative for congestion, ear discharge, rhinorrhea and sinus pressure.   Respiratory: Positive for shortness of breath and wheezing.   Cardiovascular: Negative for chest pain, palpitations and leg swelling.  Gastrointestinal: Negative.   Genitourinary: Negative.   Neurological: Negative.   Psychiatric/Behavioral: Negative.   All other systems reviewed and are negative.        Objective:   Physical Exam  Constitutional: She is oriented to person, place, and time. She appears well-developed and well-nourished.  HENT:  Nose: Nose normal.  Mouth/Throat: Oropharynx is clear and moist.  Eyes: EOM are normal.  Neck: Trachea normal, normal range of motion and full passive range of motion without pain. Neck supple. No JVD present. Carotid bruit is not present. No thyromegaly present.  Cardiovascular: Normal rate, regular rhythm, normal heart sounds and intact distal pulses.  Exam reveals no gallop and no friction rub.   No murmur heard. Pulmonary/Chest: Effort normal and breath sounds normal.  o2 via nasal cannulla  Abdominal: Soft. Bowel sounds are normal. She exhibits no distension and no mass. There is no tenderness.  Musculoskeletal: Normal range of motion.  Lymphadenopathy:    She has no cervical adenopathy.  Neurological: She is alert and oriented to person, place, and time. She has normal reflexes.  Skin: Skin is warm and dry.  Multiple scabbed over areas on lower  Legs where she bumps into things.  Psychiatric: She has a normal mood and affect. Her behavior is normal. Judgment and thought content normal.    BP (!) 106/58   Pulse 85   Temp 97.7 F (36.5 C) (Oral)        Assessment & Plan:   1. Atherosclerotic peripheral vascular disease with intermittent claudication ([place])   2. Chronic congestive heart failure, unspecified congestive heart failure type ([place])   3. Acute and chronic respiratory failure with hypercapnia (HCC)   4. COPD bronchitis   5. Anxiety   6. FTT (failure to thrive) in adult   7. Hyperlipidemia   8. Insomnia    Rest force fluids Continue daily boost RTO in 3 months follow up.  Orders Placed This Encounter  Procedures  . CMP14+EGFR  . Lipid panel  . CBC with Differential/Platelet   Meds ordered this encounter  Medications  . ALPRAZolam (XANAX) 0.5 MG tablet    Sig: Take 1 tablet (0.5 mg total) by mouth 2 (two) times  daily as needed for anxiety.    Dispense:  60 tablet    Refill:  1    Order Specific Question:   Supervising Provider    Answer:   VINCENT, CAROL L [[id number]]  . HYDROcodone-acetaminophen (NORCO) 10-325 MG tablet    Sig: Take 1 tablet by mouth 2 (two) times daily.    Dispense:  60 tablet    Refill:  0    Order Specific Question:   Supervising Provider    Answer:   Eustaquio Maize [[id number]]   Mary-Margaret Hassell Done, FNP

## 2015-11-24 DIAGNOSIS — I110 Hypertensive heart disease with heart failure: Secondary | ICD-10-CM | POA: Diagnosis not present

## 2015-11-24 DIAGNOSIS — E43 Unspecified severe protein-calorie malnutrition: Secondary | ICD-10-CM | POA: Diagnosis not present

## 2015-11-24 DIAGNOSIS — S81811D Laceration without foreign body, right lower leg, subsequent encounter: Secondary | ICD-10-CM | POA: Diagnosis not present

## 2015-11-24 DIAGNOSIS — R627 Adult failure to thrive: Secondary | ICD-10-CM | POA: Diagnosis not present

## 2015-11-24 DIAGNOSIS — Z7982 Long term (current) use of aspirin: Secondary | ICD-10-CM | POA: Diagnosis not present

## 2015-11-24 DIAGNOSIS — M069 Rheumatoid arthritis, unspecified: Secondary | ICD-10-CM | POA: Diagnosis not present

## 2015-11-24 DIAGNOSIS — I5032 Chronic diastolic (congestive) heart failure: Secondary | ICD-10-CM | POA: Diagnosis not present

## 2015-11-24 DIAGNOSIS — Z4800 Encounter for change or removal of nonsurgical wound dressing: Secondary | ICD-10-CM | POA: Diagnosis not present

## 2015-11-24 DIAGNOSIS — I739 Peripheral vascular disease, unspecified: Secondary | ICD-10-CM | POA: Diagnosis not present

## 2015-11-24 DIAGNOSIS — D539 Nutritional anemia, unspecified: Secondary | ICD-10-CM | POA: Diagnosis not present

## 2015-11-24 DIAGNOSIS — J9622 Acute and chronic respiratory failure with hypercapnia: Secondary | ICD-10-CM | POA: Diagnosis not present

## 2015-11-24 DIAGNOSIS — L89620 Pressure ulcer of left heel, unstageable: Secondary | ICD-10-CM | POA: Diagnosis not present

## 2015-11-24 DIAGNOSIS — J441 Chronic obstructive pulmonary disease with (acute) exacerbation: Secondary | ICD-10-CM | POA: Diagnosis not present

## 2015-11-25 DIAGNOSIS — M069 Rheumatoid arthritis, unspecified: Secondary | ICD-10-CM | POA: Diagnosis not present

## 2015-11-25 DIAGNOSIS — J449 Chronic obstructive pulmonary disease, unspecified: Secondary | ICD-10-CM | POA: Diagnosis not present

## 2015-11-27 ENCOUNTER — Ambulatory Visit (HOSPITAL_COMMUNITY)
Admission: RE | Admit: 2015-11-27 | Discharge: 2015-11-27 | Disposition: A | Discharge status: Home / Self Care | Payer: Medicare Other | Source: Ambulatory Visit | Attending: Internal Medicine | Admitting: Internal Medicine

## 2015-11-27 ENCOUNTER — Encounter (HOSPITAL_COMMUNITY): Payer: Self-pay

## 2015-11-27 DIAGNOSIS — R4189 Other symptoms and signs involving cognitive functions and awareness: Secondary | ICD-10-CM

## 2015-11-27 DIAGNOSIS — R4182 Altered mental status, unspecified: Secondary | ICD-10-CM | POA: Insufficient documentation

## 2015-11-27 DIAGNOSIS — W19XXXA Unspecified fall, initial encounter: Secondary | ICD-10-CM

## 2015-11-27 DIAGNOSIS — G319 Degenerative disease of nervous system, unspecified: Secondary | ICD-10-CM | POA: Diagnosis not present

## 2015-11-28 ENCOUNTER — Telehealth: Payer: Self-pay | Admitting: Nurse Practitioner

## 2015-11-28 DIAGNOSIS — R627 Adult failure to thrive: Secondary | ICD-10-CM | POA: Diagnosis not present

## 2015-11-28 DIAGNOSIS — M069 Rheumatoid arthritis, unspecified: Secondary | ICD-10-CM | POA: Diagnosis not present

## 2015-11-28 DIAGNOSIS — Z4800 Encounter for change or removal of nonsurgical wound dressing: Secondary | ICD-10-CM | POA: Diagnosis not present

## 2015-11-28 DIAGNOSIS — J441 Chronic obstructive pulmonary disease with (acute) exacerbation: Secondary | ICD-10-CM | POA: Diagnosis not present

## 2015-11-28 DIAGNOSIS — I5032 Chronic diastolic (congestive) heart failure: Secondary | ICD-10-CM | POA: Diagnosis not present

## 2015-11-28 DIAGNOSIS — S81811D Laceration without foreign body, right lower leg, subsequent encounter: Secondary | ICD-10-CM | POA: Diagnosis not present

## 2015-11-28 DIAGNOSIS — I739 Peripheral vascular disease, unspecified: Secondary | ICD-10-CM | POA: Diagnosis not present

## 2015-11-28 DIAGNOSIS — L89620 Pressure ulcer of left heel, unstageable: Secondary | ICD-10-CM | POA: Diagnosis not present

## 2015-11-28 DIAGNOSIS — D539 Nutritional anemia, unspecified: Secondary | ICD-10-CM | POA: Diagnosis not present

## 2015-11-28 DIAGNOSIS — I110 Hypertensive heart disease with heart failure: Secondary | ICD-10-CM | POA: Diagnosis not present

## 2015-11-28 DIAGNOSIS — Z7982 Long term (current) use of aspirin: Secondary | ICD-10-CM | POA: Diagnosis not present

## 2015-11-28 DIAGNOSIS — E43 Unspecified severe protein-calorie malnutrition: Secondary | ICD-10-CM | POA: Diagnosis not present

## 2015-11-28 DIAGNOSIS — J9622 Acute and chronic respiratory failure with hypercapnia: Secondary | ICD-10-CM | POA: Diagnosis not present

## 2015-11-28 NOTE — Telephone Encounter (Signed)
Patient aware she needs to have labwork performed.  She will come in sometime in the next week or so to have this done.

## 2015-11-28 NOTE — Telephone Encounter (Signed)
Patient did not have labs drawn when she was here- needs to come in and have done

## 2015-11-28 NOTE — Telephone Encounter (Signed)
Patient aware she needs to come in to have labwork done.  Will come in in the next week or so to have this done.

## 2015-11-29 DIAGNOSIS — S81811D Laceration without foreign body, right lower leg, subsequent encounter: Secondary | ICD-10-CM | POA: Diagnosis not present

## 2015-11-29 DIAGNOSIS — I5032 Chronic diastolic (congestive) heart failure: Secondary | ICD-10-CM | POA: Diagnosis not present

## 2015-11-29 DIAGNOSIS — D539 Nutritional anemia, unspecified: Secondary | ICD-10-CM | POA: Diagnosis not present

## 2015-11-29 DIAGNOSIS — E43 Unspecified severe protein-calorie malnutrition: Secondary | ICD-10-CM | POA: Diagnosis not present

## 2015-11-29 DIAGNOSIS — R627 Adult failure to thrive: Secondary | ICD-10-CM | POA: Diagnosis not present

## 2015-11-29 DIAGNOSIS — J9622 Acute and chronic respiratory failure with hypercapnia: Secondary | ICD-10-CM | POA: Diagnosis not present

## 2015-11-29 DIAGNOSIS — Z4800 Encounter for change or removal of nonsurgical wound dressing: Secondary | ICD-10-CM | POA: Diagnosis not present

## 2015-11-29 DIAGNOSIS — I739 Peripheral vascular disease, unspecified: Secondary | ICD-10-CM | POA: Diagnosis not present

## 2015-11-29 DIAGNOSIS — J441 Chronic obstructive pulmonary disease with (acute) exacerbation: Secondary | ICD-10-CM | POA: Diagnosis not present

## 2015-11-29 DIAGNOSIS — M069 Rheumatoid arthritis, unspecified: Secondary | ICD-10-CM | POA: Diagnosis not present

## 2015-11-29 DIAGNOSIS — Z7982 Long term (current) use of aspirin: Secondary | ICD-10-CM | POA: Diagnosis not present

## 2015-11-29 DIAGNOSIS — L89620 Pressure ulcer of left heel, unstageable: Secondary | ICD-10-CM | POA: Diagnosis not present

## 2015-11-29 DIAGNOSIS — I110 Hypertensive heart disease with heart failure: Secondary | ICD-10-CM | POA: Diagnosis not present

## 2015-11-30 DIAGNOSIS — J441 Chronic obstructive pulmonary disease with (acute) exacerbation: Secondary | ICD-10-CM | POA: Diagnosis not present

## 2015-11-30 DIAGNOSIS — I739 Peripheral vascular disease, unspecified: Secondary | ICD-10-CM | POA: Diagnosis not present

## 2015-11-30 DIAGNOSIS — D539 Nutritional anemia, unspecified: Secondary | ICD-10-CM | POA: Diagnosis not present

## 2015-11-30 DIAGNOSIS — E43 Unspecified severe protein-calorie malnutrition: Secondary | ICD-10-CM | POA: Diagnosis not present

## 2015-11-30 DIAGNOSIS — Z4800 Encounter for change or removal of nonsurgical wound dressing: Secondary | ICD-10-CM | POA: Diagnosis not present

## 2015-11-30 DIAGNOSIS — L89620 Pressure ulcer of left heel, unstageable: Secondary | ICD-10-CM | POA: Diagnosis not present

## 2015-11-30 DIAGNOSIS — R627 Adult failure to thrive: Secondary | ICD-10-CM | POA: Diagnosis not present

## 2015-11-30 DIAGNOSIS — M069 Rheumatoid arthritis, unspecified: Secondary | ICD-10-CM | POA: Diagnosis not present

## 2015-11-30 DIAGNOSIS — J9622 Acute and chronic respiratory failure with hypercapnia: Secondary | ICD-10-CM | POA: Diagnosis not present

## 2015-11-30 DIAGNOSIS — S81811D Laceration without foreign body, right lower leg, subsequent encounter: Secondary | ICD-10-CM | POA: Diagnosis not present

## 2015-11-30 DIAGNOSIS — I110 Hypertensive heart disease with heart failure: Secondary | ICD-10-CM | POA: Diagnosis not present

## 2015-11-30 DIAGNOSIS — I5032 Chronic diastolic (congestive) heart failure: Secondary | ICD-10-CM | POA: Diagnosis not present

## 2015-11-30 DIAGNOSIS — Z7982 Long term (current) use of aspirin: Secondary | ICD-10-CM | POA: Diagnosis not present

## 2015-12-01 DIAGNOSIS — J441 Chronic obstructive pulmonary disease with (acute) exacerbation: Secondary | ICD-10-CM | POA: Diagnosis not present

## 2015-12-01 DIAGNOSIS — R627 Adult failure to thrive: Secondary | ICD-10-CM | POA: Diagnosis not present

## 2015-12-01 DIAGNOSIS — I739 Peripheral vascular disease, unspecified: Secondary | ICD-10-CM | POA: Diagnosis not present

## 2015-12-01 DIAGNOSIS — L89620 Pressure ulcer of left heel, unstageable: Secondary | ICD-10-CM | POA: Diagnosis not present

## 2015-12-01 DIAGNOSIS — M069 Rheumatoid arthritis, unspecified: Secondary | ICD-10-CM | POA: Diagnosis not present

## 2015-12-01 DIAGNOSIS — J9622 Acute and chronic respiratory failure with hypercapnia: Secondary | ICD-10-CM | POA: Diagnosis not present

## 2015-12-01 DIAGNOSIS — Z4800 Encounter for change or removal of nonsurgical wound dressing: Secondary | ICD-10-CM | POA: Diagnosis not present

## 2015-12-01 DIAGNOSIS — E43 Unspecified severe protein-calorie malnutrition: Secondary | ICD-10-CM | POA: Diagnosis not present

## 2015-12-01 DIAGNOSIS — S81811D Laceration without foreign body, right lower leg, subsequent encounter: Secondary | ICD-10-CM | POA: Diagnosis not present

## 2015-12-01 DIAGNOSIS — Z7982 Long term (current) use of aspirin: Secondary | ICD-10-CM | POA: Diagnosis not present

## 2015-12-01 DIAGNOSIS — D539 Nutritional anemia, unspecified: Secondary | ICD-10-CM | POA: Diagnosis not present

## 2015-12-01 DIAGNOSIS — I110 Hypertensive heart disease with heart failure: Secondary | ICD-10-CM | POA: Diagnosis not present

## 2015-12-01 DIAGNOSIS — I5032 Chronic diastolic (congestive) heart failure: Secondary | ICD-10-CM | POA: Diagnosis not present

## 2015-12-04 DIAGNOSIS — L89620 Pressure ulcer of left heel, unstageable: Secondary | ICD-10-CM | POA: Diagnosis not present

## 2015-12-04 DIAGNOSIS — I110 Hypertensive heart disease with heart failure: Secondary | ICD-10-CM | POA: Diagnosis not present

## 2015-12-04 DIAGNOSIS — J441 Chronic obstructive pulmonary disease with (acute) exacerbation: Secondary | ICD-10-CM | POA: Diagnosis not present

## 2015-12-04 DIAGNOSIS — R627 Adult failure to thrive: Secondary | ICD-10-CM | POA: Diagnosis not present

## 2015-12-04 DIAGNOSIS — Z7982 Long term (current) use of aspirin: Secondary | ICD-10-CM | POA: Diagnosis not present

## 2015-12-04 DIAGNOSIS — J9622 Acute and chronic respiratory failure with hypercapnia: Secondary | ICD-10-CM | POA: Diagnosis not present

## 2015-12-04 DIAGNOSIS — M069 Rheumatoid arthritis, unspecified: Secondary | ICD-10-CM | POA: Diagnosis not present

## 2015-12-04 DIAGNOSIS — I739 Peripheral vascular disease, unspecified: Secondary | ICD-10-CM | POA: Diagnosis not present

## 2015-12-04 DIAGNOSIS — D539 Nutritional anemia, unspecified: Secondary | ICD-10-CM | POA: Diagnosis not present

## 2015-12-04 DIAGNOSIS — Z4800 Encounter for change or removal of nonsurgical wound dressing: Secondary | ICD-10-CM | POA: Diagnosis not present

## 2015-12-04 DIAGNOSIS — I5032 Chronic diastolic (congestive) heart failure: Secondary | ICD-10-CM | POA: Diagnosis not present

## 2015-12-04 DIAGNOSIS — S81811D Laceration without foreign body, right lower leg, subsequent encounter: Secondary | ICD-10-CM | POA: Diagnosis not present

## 2015-12-04 DIAGNOSIS — E43 Unspecified severe protein-calorie malnutrition: Secondary | ICD-10-CM | POA: Diagnosis not present

## 2015-12-05 ENCOUNTER — Telehealth: Payer: Self-pay

## 2015-12-05 DIAGNOSIS — R627 Adult failure to thrive: Secondary | ICD-10-CM | POA: Diagnosis not present

## 2015-12-05 DIAGNOSIS — S81811D Laceration without foreign body, right lower leg, subsequent encounter: Secondary | ICD-10-CM | POA: Diagnosis not present

## 2015-12-05 DIAGNOSIS — Z7982 Long term (current) use of aspirin: Secondary | ICD-10-CM | POA: Diagnosis not present

## 2015-12-05 DIAGNOSIS — L89620 Pressure ulcer of left heel, unstageable: Secondary | ICD-10-CM | POA: Diagnosis not present

## 2015-12-05 DIAGNOSIS — M069 Rheumatoid arthritis, unspecified: Secondary | ICD-10-CM | POA: Diagnosis not present

## 2015-12-05 DIAGNOSIS — D539 Nutritional anemia, unspecified: Secondary | ICD-10-CM | POA: Diagnosis not present

## 2015-12-05 DIAGNOSIS — Z4800 Encounter for change or removal of nonsurgical wound dressing: Secondary | ICD-10-CM | POA: Diagnosis not present

## 2015-12-05 DIAGNOSIS — E43 Unspecified severe protein-calorie malnutrition: Secondary | ICD-10-CM | POA: Diagnosis not present

## 2015-12-05 DIAGNOSIS — I110 Hypertensive heart disease with heart failure: Secondary | ICD-10-CM | POA: Diagnosis not present

## 2015-12-05 DIAGNOSIS — J441 Chronic obstructive pulmonary disease with (acute) exacerbation: Secondary | ICD-10-CM | POA: Diagnosis not present

## 2015-12-05 DIAGNOSIS — I5032 Chronic diastolic (congestive) heart failure: Secondary | ICD-10-CM | POA: Diagnosis not present

## 2015-12-05 DIAGNOSIS — J9622 Acute and chronic respiratory failure with hypercapnia: Secondary | ICD-10-CM | POA: Diagnosis not present

## 2015-12-05 DIAGNOSIS — I739 Peripheral vascular disease, unspecified: Secondary | ICD-10-CM | POA: Diagnosis not present

## 2015-12-06 ENCOUNTER — Telehealth: Payer: Self-pay | Admitting: Nurse Practitioner

## 2015-12-06 DIAGNOSIS — D539 Nutritional anemia, unspecified: Secondary | ICD-10-CM | POA: Diagnosis not present

## 2015-12-06 DIAGNOSIS — J441 Chronic obstructive pulmonary disease with (acute) exacerbation: Secondary | ICD-10-CM | POA: Diagnosis not present

## 2015-12-06 DIAGNOSIS — L89620 Pressure ulcer of left heel, unstageable: Secondary | ICD-10-CM | POA: Diagnosis not present

## 2015-12-06 DIAGNOSIS — E43 Unspecified severe protein-calorie malnutrition: Secondary | ICD-10-CM | POA: Diagnosis not present

## 2015-12-06 DIAGNOSIS — R627 Adult failure to thrive: Secondary | ICD-10-CM | POA: Diagnosis not present

## 2015-12-06 DIAGNOSIS — I5032 Chronic diastolic (congestive) heart failure: Secondary | ICD-10-CM | POA: Diagnosis not present

## 2015-12-06 DIAGNOSIS — S81811D Laceration without foreign body, right lower leg, subsequent encounter: Secondary | ICD-10-CM | POA: Diagnosis not present

## 2015-12-06 DIAGNOSIS — I739 Peripheral vascular disease, unspecified: Secondary | ICD-10-CM | POA: Diagnosis not present

## 2015-12-06 DIAGNOSIS — J9622 Acute and chronic respiratory failure with hypercapnia: Secondary | ICD-10-CM | POA: Diagnosis not present

## 2015-12-06 DIAGNOSIS — I110 Hypertensive heart disease with heart failure: Secondary | ICD-10-CM | POA: Diagnosis not present

## 2015-12-06 DIAGNOSIS — M069 Rheumatoid arthritis, unspecified: Secondary | ICD-10-CM | POA: Diagnosis not present

## 2015-12-06 DIAGNOSIS — Z7982 Long term (current) use of aspirin: Secondary | ICD-10-CM | POA: Diagnosis not present

## 2015-12-06 DIAGNOSIS — Z4800 Encounter for change or removal of nonsurgical wound dressing: Secondary | ICD-10-CM | POA: Diagnosis not present

## 2015-12-07 ENCOUNTER — Encounter: Payer: Self-pay | Admitting: Nurse Practitioner

## 2015-12-07 ENCOUNTER — Other Ambulatory Visit: Payer: Self-pay

## 2015-12-07 ENCOUNTER — Ambulatory Visit (INDEPENDENT_AMBULATORY_CARE_PROVIDER_SITE_OTHER): Payer: Medicare Other | Admitting: Nurse Practitioner

## 2015-12-07 ENCOUNTER — Telehealth: Payer: Self-pay | Admitting: *Deleted

## 2015-12-07 VITALS — BP 90/54 | HR 72 | Temp 98.5°F

## 2015-12-07 DIAGNOSIS — D539 Nutritional anemia, unspecified: Secondary | ICD-10-CM | POA: Diagnosis not present

## 2015-12-07 DIAGNOSIS — Z4800 Encounter for change or removal of nonsurgical wound dressing: Secondary | ICD-10-CM | POA: Diagnosis not present

## 2015-12-07 DIAGNOSIS — L89620 Pressure ulcer of left heel, unstageable: Secondary | ICD-10-CM | POA: Diagnosis not present

## 2015-12-07 DIAGNOSIS — J9622 Acute and chronic respiratory failure with hypercapnia: Secondary | ICD-10-CM | POA: Diagnosis not present

## 2015-12-07 DIAGNOSIS — I110 Hypertensive heart disease with heart failure: Secondary | ICD-10-CM | POA: Diagnosis not present

## 2015-12-07 DIAGNOSIS — Z7982 Long term (current) use of aspirin: Secondary | ICD-10-CM | POA: Diagnosis not present

## 2015-12-07 DIAGNOSIS — J0101 Acute recurrent maxillary sinusitis: Secondary | ICD-10-CM | POA: Diagnosis not present

## 2015-12-07 DIAGNOSIS — I5032 Chronic diastolic (congestive) heart failure: Secondary | ICD-10-CM | POA: Diagnosis not present

## 2015-12-07 DIAGNOSIS — R627 Adult failure to thrive: Secondary | ICD-10-CM | POA: Diagnosis not present

## 2015-12-07 DIAGNOSIS — J441 Chronic obstructive pulmonary disease with (acute) exacerbation: Secondary | ICD-10-CM | POA: Diagnosis not present

## 2015-12-07 DIAGNOSIS — I739 Peripheral vascular disease, unspecified: Secondary | ICD-10-CM | POA: Diagnosis not present

## 2015-12-07 DIAGNOSIS — E43 Unspecified severe protein-calorie malnutrition: Secondary | ICD-10-CM | POA: Diagnosis not present

## 2015-12-07 DIAGNOSIS — S81811D Laceration without foreign body, right lower leg, subsequent encounter: Secondary | ICD-10-CM | POA: Diagnosis not present

## 2015-12-07 DIAGNOSIS — M069 Rheumatoid arthritis, unspecified: Secondary | ICD-10-CM | POA: Diagnosis not present

## 2015-12-07 MED ORDER — AZITHROMYCIN 250 MG PO TABS: ORAL_TABLET | ORAL | 0 refills | Status: DC

## 2015-12-07 NOTE — Telephone Encounter (Signed)
FYI Per OT Mallory, pt has productive cough and resting HR 101-112. Audible gurgling on inspiration

## 2015-12-07 NOTE — Telephone Encounter (Signed)
If they do not want Home health aid anymore- ok to stop

## 2015-12-07 NOTE — Progress Notes (Signed)
Subjective:     [name] is a 69 y.o. female who presents for evaluation of sinus pain. Symptoms include: congestion, cough, facial pain, post nasal drip and sinus pressure. Onset of symptoms was 2 weeks ago. Symptoms have been gradually worsening since that time. Past history is significant for COPD, emphysema and pneumonia. Patient is a former smoker, quit 5 years ago.  The following portions of the patient's history were reviewed and updated as appropriate: allergies, current medications, past family history, past medical history, past social history, past surgical history and problem list.  Review of Systems Pertinent items noted in HPI and remainder of comprehensive ROS otherwise negative.   Objective:    BP (!) 90/54   Pulse 72   Temp 98.5 F (36.9 C) (Oral)  General appearance: alert Eyes: conjunctivae/corneas clear. PERRL, EOM's intact. Fundi benign. Ears: normal TM's and external ear canals both ears Nose: clear discharge, moderate congestion, turbinates red, no sinus tenderness Throat: lips, mucosa, and tongue normal; teeth and gums normal Neck: no adenopathy, no carotid bruit, no JVD, supple, symmetrical, trachea midline and thyroid not enlarged, symmetric, no tenderness/mass/nodules Lungs: diminished breath sounds LLL Heart: regular rate and rhythm, S1, S2 normal, no murmur, click, rub or gallop    Assessment:    Acute bacterial sinusitis.    Plan:   1. Take meds as prescribed 2. Use a cool mist humidifier especially during the winter months and when heat has been humid. 3. Use saline nose sprays frequently 4. Saline irrigations of the nose can be very helpful if done frequently.  * 4X daily for 1 week*  * Use of a nettie pot can be helpful with this. Follow directions with this* 5. Drink plenty of fluids 6. Keep thermostat turn down low 7.For any cough or congestion  Use plain Mucinex- regular strength or max strength is fine   * Children- consult with  Pharmacist for dosing 8. For fever or aces or pains- take tylenol or ibuprofen appropriate for age and weight.  * for fevers greater than 101 orally you may alternate ibuprofen and tylenol every  3 hours.   Meds ordered this encounter  Medications  . azithromycin (ZITHROMAX) 250 MG tablet    Sig: Two tablets day one, then one tablet daily next 4 days.    Dispense:  6 tablet    Refill:  0    Order Specific Question:   Supervising Provider    Answer:   Eustaquio Maize [[id number]]   Mary-Margaret Hassell Done, FNP

## 2015-12-07 NOTE — Telephone Encounter (Signed)
Verbal order given  

## 2015-12-07 NOTE — Patient Instructions (Signed)

## 2015-12-08 DIAGNOSIS — E43 Unspecified severe protein-calorie malnutrition: Secondary | ICD-10-CM | POA: Diagnosis not present

## 2015-12-08 DIAGNOSIS — I739 Peripheral vascular disease, unspecified: Secondary | ICD-10-CM | POA: Diagnosis not present

## 2015-12-08 DIAGNOSIS — M069 Rheumatoid arthritis, unspecified: Secondary | ICD-10-CM | POA: Diagnosis not present

## 2015-12-08 DIAGNOSIS — Z7982 Long term (current) use of aspirin: Secondary | ICD-10-CM | POA: Diagnosis not present

## 2015-12-08 DIAGNOSIS — S81811D Laceration without foreign body, right lower leg, subsequent encounter: Secondary | ICD-10-CM | POA: Diagnosis not present

## 2015-12-08 DIAGNOSIS — I5032 Chronic diastolic (congestive) heart failure: Secondary | ICD-10-CM | POA: Diagnosis not present

## 2015-12-08 DIAGNOSIS — L89620 Pressure ulcer of left heel, unstageable: Secondary | ICD-10-CM | POA: Diagnosis not present

## 2015-12-08 DIAGNOSIS — R627 Adult failure to thrive: Secondary | ICD-10-CM | POA: Diagnosis not present

## 2015-12-08 DIAGNOSIS — I110 Hypertensive heart disease with heart failure: Secondary | ICD-10-CM | POA: Diagnosis not present

## 2015-12-08 DIAGNOSIS — J9622 Acute and chronic respiratory failure with hypercapnia: Secondary | ICD-10-CM | POA: Diagnosis not present

## 2015-12-08 DIAGNOSIS — D539 Nutritional anemia, unspecified: Secondary | ICD-10-CM | POA: Diagnosis not present

## 2015-12-08 DIAGNOSIS — Z4800 Encounter for change or removal of nonsurgical wound dressing: Secondary | ICD-10-CM | POA: Diagnosis not present

## 2015-12-08 DIAGNOSIS — J441 Chronic obstructive pulmonary disease with (acute) exacerbation: Secondary | ICD-10-CM | POA: Diagnosis not present

## 2015-12-09 ENCOUNTER — Other Ambulatory Visit: Payer: Self-pay | Admitting: Nurse Practitioner

## 2015-12-11 DIAGNOSIS — M069 Rheumatoid arthritis, unspecified: Secondary | ICD-10-CM | POA: Diagnosis not present

## 2015-12-11 DIAGNOSIS — S81811D Laceration without foreign body, right lower leg, subsequent encounter: Secondary | ICD-10-CM | POA: Diagnosis not present

## 2015-12-11 DIAGNOSIS — J441 Chronic obstructive pulmonary disease with (acute) exacerbation: Secondary | ICD-10-CM | POA: Diagnosis not present

## 2015-12-11 DIAGNOSIS — I739 Peripheral vascular disease, unspecified: Secondary | ICD-10-CM | POA: Diagnosis not present

## 2015-12-11 DIAGNOSIS — Z7982 Long term (current) use of aspirin: Secondary | ICD-10-CM | POA: Diagnosis not present

## 2015-12-11 DIAGNOSIS — Z4800 Encounter for change or removal of nonsurgical wound dressing: Secondary | ICD-10-CM | POA: Diagnosis not present

## 2015-12-11 DIAGNOSIS — E43 Unspecified severe protein-calorie malnutrition: Secondary | ICD-10-CM | POA: Diagnosis not present

## 2015-12-11 DIAGNOSIS — I5032 Chronic diastolic (congestive) heart failure: Secondary | ICD-10-CM | POA: Diagnosis not present

## 2015-12-11 DIAGNOSIS — I110 Hypertensive heart disease with heart failure: Secondary | ICD-10-CM | POA: Diagnosis not present

## 2015-12-11 DIAGNOSIS — D539 Nutritional anemia, unspecified: Secondary | ICD-10-CM | POA: Diagnosis not present

## 2015-12-11 DIAGNOSIS — J9622 Acute and chronic respiratory failure with hypercapnia: Secondary | ICD-10-CM | POA: Diagnosis not present

## 2015-12-11 DIAGNOSIS — R627 Adult failure to thrive: Secondary | ICD-10-CM | POA: Diagnosis not present

## 2015-12-11 DIAGNOSIS — L89620 Pressure ulcer of left heel, unstageable: Secondary | ICD-10-CM | POA: Diagnosis not present

## 2015-12-11 NOTE — Telephone Encounter (Signed)
Patient wass just seen in office last week and was given antibiotic.

## 2015-12-12 DIAGNOSIS — M069 Rheumatoid arthritis, unspecified: Secondary | ICD-10-CM | POA: Diagnosis not present

## 2015-12-12 DIAGNOSIS — J9622 Acute and chronic respiratory failure with hypercapnia: Secondary | ICD-10-CM | POA: Diagnosis not present

## 2015-12-12 DIAGNOSIS — J441 Chronic obstructive pulmonary disease with (acute) exacerbation: Secondary | ICD-10-CM | POA: Diagnosis not present

## 2015-12-12 DIAGNOSIS — L89620 Pressure ulcer of left heel, unstageable: Secondary | ICD-10-CM | POA: Diagnosis not present

## 2015-12-12 DIAGNOSIS — I5032 Chronic diastolic (congestive) heart failure: Secondary | ICD-10-CM | POA: Diagnosis not present

## 2015-12-12 DIAGNOSIS — S81811D Laceration without foreign body, right lower leg, subsequent encounter: Secondary | ICD-10-CM | POA: Diagnosis not present

## 2015-12-12 DIAGNOSIS — Z7982 Long term (current) use of aspirin: Secondary | ICD-10-CM | POA: Diagnosis not present

## 2015-12-12 DIAGNOSIS — Z4800 Encounter for change or removal of nonsurgical wound dressing: Secondary | ICD-10-CM | POA: Diagnosis not present

## 2015-12-12 DIAGNOSIS — E43 Unspecified severe protein-calorie malnutrition: Secondary | ICD-10-CM | POA: Diagnosis not present

## 2015-12-12 DIAGNOSIS — R627 Adult failure to thrive: Secondary | ICD-10-CM | POA: Diagnosis not present

## 2015-12-12 DIAGNOSIS — D539 Nutritional anemia, unspecified: Secondary | ICD-10-CM | POA: Diagnosis not present

## 2015-12-12 DIAGNOSIS — I739 Peripheral vascular disease, unspecified: Secondary | ICD-10-CM | POA: Diagnosis not present

## 2015-12-12 DIAGNOSIS — I110 Hypertensive heart disease with heart failure: Secondary | ICD-10-CM | POA: Diagnosis not present

## 2015-12-13 DIAGNOSIS — I110 Hypertensive heart disease with heart failure: Secondary | ICD-10-CM | POA: Diagnosis not present

## 2015-12-13 DIAGNOSIS — L89620 Pressure ulcer of left heel, unstageable: Secondary | ICD-10-CM | POA: Diagnosis not present

## 2015-12-13 DIAGNOSIS — S81811D Laceration without foreign body, right lower leg, subsequent encounter: Secondary | ICD-10-CM | POA: Diagnosis not present

## 2015-12-13 DIAGNOSIS — E43 Unspecified severe protein-calorie malnutrition: Secondary | ICD-10-CM | POA: Diagnosis not present

## 2015-12-13 DIAGNOSIS — D539 Nutritional anemia, unspecified: Secondary | ICD-10-CM | POA: Diagnosis not present

## 2015-12-13 DIAGNOSIS — I739 Peripheral vascular disease, unspecified: Secondary | ICD-10-CM | POA: Diagnosis not present

## 2015-12-13 DIAGNOSIS — J9622 Acute and chronic respiratory failure with hypercapnia: Secondary | ICD-10-CM | POA: Diagnosis not present

## 2015-12-13 DIAGNOSIS — Z7982 Long term (current) use of aspirin: Secondary | ICD-10-CM | POA: Diagnosis not present

## 2015-12-13 DIAGNOSIS — Z4800 Encounter for change or removal of nonsurgical wound dressing: Secondary | ICD-10-CM | POA: Diagnosis not present

## 2015-12-13 DIAGNOSIS — I5032 Chronic diastolic (congestive) heart failure: Secondary | ICD-10-CM | POA: Diagnosis not present

## 2015-12-13 DIAGNOSIS — M069 Rheumatoid arthritis, unspecified: Secondary | ICD-10-CM | POA: Diagnosis not present

## 2015-12-13 DIAGNOSIS — R627 Adult failure to thrive: Secondary | ICD-10-CM | POA: Diagnosis not present

## 2015-12-13 DIAGNOSIS — J441 Chronic obstructive pulmonary disease with (acute) exacerbation: Secondary | ICD-10-CM | POA: Diagnosis not present

## 2015-12-14 DIAGNOSIS — Z4800 Encounter for change or removal of nonsurgical wound dressing: Secondary | ICD-10-CM | POA: Diagnosis not present

## 2015-12-14 DIAGNOSIS — S81811D Laceration without foreign body, right lower leg, subsequent encounter: Secondary | ICD-10-CM | POA: Diagnosis not present

## 2015-12-14 DIAGNOSIS — J9622 Acute and chronic respiratory failure with hypercapnia: Secondary | ICD-10-CM | POA: Diagnosis not present

## 2015-12-14 DIAGNOSIS — I739 Peripheral vascular disease, unspecified: Secondary | ICD-10-CM | POA: Diagnosis not present

## 2015-12-14 DIAGNOSIS — I110 Hypertensive heart disease with heart failure: Secondary | ICD-10-CM | POA: Diagnosis not present

## 2015-12-14 DIAGNOSIS — Z7982 Long term (current) use of aspirin: Secondary | ICD-10-CM | POA: Diagnosis not present

## 2015-12-14 DIAGNOSIS — I5032 Chronic diastolic (congestive) heart failure: Secondary | ICD-10-CM | POA: Diagnosis not present

## 2015-12-14 DIAGNOSIS — E43 Unspecified severe protein-calorie malnutrition: Secondary | ICD-10-CM | POA: Diagnosis not present

## 2015-12-14 DIAGNOSIS — J441 Chronic obstructive pulmonary disease with (acute) exacerbation: Secondary | ICD-10-CM | POA: Diagnosis not present

## 2015-12-14 DIAGNOSIS — L89620 Pressure ulcer of left heel, unstageable: Secondary | ICD-10-CM | POA: Diagnosis not present

## 2015-12-14 DIAGNOSIS — D539 Nutritional anemia, unspecified: Secondary | ICD-10-CM | POA: Diagnosis not present

## 2015-12-14 DIAGNOSIS — M069 Rheumatoid arthritis, unspecified: Secondary | ICD-10-CM | POA: Diagnosis not present

## 2015-12-14 DIAGNOSIS — R627 Adult failure to thrive: Secondary | ICD-10-CM | POA: Diagnosis not present

## 2015-12-17 DIAGNOSIS — Z9289 Personal history of other medical treatment: Secondary | ICD-10-CM

## 2015-12-17 HISTORY — DX: Personal history of other medical treatment: Z92.89

## 2015-12-19 ENCOUNTER — Encounter (HOSPITAL_COMMUNITY): Payer: Self-pay | Admitting: Emergency Medicine

## 2015-12-19 ENCOUNTER — Telehealth: Payer: Self-pay | Admitting: Nurse Practitioner

## 2015-12-19 ENCOUNTER — Inpatient Hospital Stay (HOSPITAL_COMMUNITY)
Admission: EM | Admit: 2015-12-19 | Discharge: 2015-12-25 | DRG: 193 | Disposition: A | Discharge status: Home Health | Payer: Medicare Other | Attending: Internal Medicine | Admitting: Internal Medicine

## 2015-12-19 ENCOUNTER — Emergency Department (HOSPITAL_COMMUNITY): Payer: Medicare Other

## 2015-12-19 DIAGNOSIS — I110 Hypertensive heart disease with heart failure: Secondary | ICD-10-CM | POA: Diagnosis not present

## 2015-12-19 DIAGNOSIS — J449 Chronic obstructive pulmonary disease, unspecified: Secondary | ICD-10-CM | POA: Diagnosis not present

## 2015-12-19 DIAGNOSIS — R262 Difficulty in walking, not elsewhere classified: Secondary | ICD-10-CM

## 2015-12-19 DIAGNOSIS — R05 Cough: Secondary | ICD-10-CM | POA: Diagnosis not present

## 2015-12-19 DIAGNOSIS — J90 Pleural effusion, not elsewhere classified: Secondary | ICD-10-CM | POA: Diagnosis not present

## 2015-12-19 DIAGNOSIS — Z8249 Family history of ischemic heart disease and other diseases of the circulatory system: Secondary | ICD-10-CM

## 2015-12-19 DIAGNOSIS — J441 Chronic obstructive pulmonary disease with (acute) exacerbation: Secondary | ICD-10-CM | POA: Diagnosis not present

## 2015-12-19 DIAGNOSIS — R739 Hyperglycemia, unspecified: Secondary | ICD-10-CM | POA: Diagnosis not present

## 2015-12-19 DIAGNOSIS — Z23 Encounter for immunization: Secondary | ICD-10-CM

## 2015-12-19 DIAGNOSIS — Z96641 Presence of right artificial hip joint: Secondary | ICD-10-CM | POA: Diagnosis not present

## 2015-12-19 DIAGNOSIS — F419 Anxiety disorder, unspecified: Secondary | ICD-10-CM | POA: Diagnosis present

## 2015-12-19 DIAGNOSIS — J189 Pneumonia, unspecified organism: Secondary | ICD-10-CM | POA: Diagnosis not present

## 2015-12-19 DIAGNOSIS — I4891 Unspecified atrial fibrillation: Secondary | ICD-10-CM | POA: Diagnosis present

## 2015-12-19 DIAGNOSIS — E876 Hypokalemia: Secondary | ICD-10-CM | POA: Diagnosis not present

## 2015-12-19 DIAGNOSIS — Z4800 Encounter for change or removal of nonsurgical wound dressing: Secondary | ICD-10-CM | POA: Diagnosis not present

## 2015-12-19 DIAGNOSIS — I5032 Chronic diastolic (congestive) heart failure: Secondary | ICD-10-CM | POA: Diagnosis present

## 2015-12-19 DIAGNOSIS — E43 Unspecified severe protein-calorie malnutrition: Secondary | ICD-10-CM | POA: Diagnosis present

## 2015-12-19 DIAGNOSIS — L89620 Pressure ulcer of left heel, unstageable: Secondary | ICD-10-CM | POA: Diagnosis not present

## 2015-12-19 DIAGNOSIS — I10 Essential (primary) hypertension: Secondary | ICD-10-CM | POA: Diagnosis present

## 2015-12-19 DIAGNOSIS — Y95 Nosocomial condition: Secondary | ICD-10-CM | POA: Diagnosis present

## 2015-12-19 DIAGNOSIS — Z79899 Other long term (current) drug therapy: Secondary | ICD-10-CM | POA: Diagnosis not present

## 2015-12-19 DIAGNOSIS — R0902 Hypoxemia: Secondary | ICD-10-CM

## 2015-12-19 DIAGNOSIS — J9622 Acute and chronic respiratory failure with hypercapnia: Secondary | ICD-10-CM | POA: Diagnosis not present

## 2015-12-19 DIAGNOSIS — Z87891 Personal history of nicotine dependence: Secondary | ICD-10-CM | POA: Diagnosis not present

## 2015-12-19 DIAGNOSIS — J9601 Acute respiratory failure with hypoxia: Secondary | ICD-10-CM | POA: Diagnosis not present

## 2015-12-19 DIAGNOSIS — Z22322 Carrier or suspected carrier of Methicillin resistant Staphylococcus aureus: Secondary | ICD-10-CM | POA: Diagnosis not present

## 2015-12-19 DIAGNOSIS — Z91048 Other nonmedicinal substance allergy status: Secondary | ICD-10-CM

## 2015-12-19 DIAGNOSIS — Z9181 History of falling: Secondary | ICD-10-CM

## 2015-12-19 DIAGNOSIS — Z888 Allergy status to other drugs, medicaments and biological substances status: Secondary | ICD-10-CM

## 2015-12-19 DIAGNOSIS — Z9981 Dependence on supplemental oxygen: Secondary | ICD-10-CM

## 2015-12-19 DIAGNOSIS — J440 Chronic obstructive pulmonary disease with acute lower respiratory infection: Secondary | ICD-10-CM | POA: Diagnosis not present

## 2015-12-19 DIAGNOSIS — M069 Rheumatoid arthritis, unspecified: Secondary | ICD-10-CM | POA: Diagnosis not present

## 2015-12-19 DIAGNOSIS — D539 Nutritional anemia, unspecified: Secondary | ICD-10-CM | POA: Diagnosis not present

## 2015-12-19 DIAGNOSIS — A419 Sepsis, unspecified organism: Secondary | ICD-10-CM | POA: Diagnosis not present

## 2015-12-19 DIAGNOSIS — Z681 Body mass index (BMI) 19 or less, adult: Secondary | ICD-10-CM | POA: Diagnosis not present

## 2015-12-19 DIAGNOSIS — I739 Peripheral vascular disease, unspecified: Secondary | ICD-10-CM | POA: Diagnosis not present

## 2015-12-19 DIAGNOSIS — Z7982 Long term (current) use of aspirin: Secondary | ICD-10-CM

## 2015-12-19 DIAGNOSIS — R627 Adult failure to thrive: Secondary | ICD-10-CM | POA: Diagnosis not present

## 2015-12-19 DIAGNOSIS — R0689 Other abnormalities of breathing: Secondary | ICD-10-CM | POA: Diagnosis present

## 2015-12-19 DIAGNOSIS — S81811D Laceration without foreign body, right lower leg, subsequent encounter: Secondary | ICD-10-CM | POA: Diagnosis not present

## 2015-12-19 LAB — COMPREHENSIVE METABOLIC PANEL
ALT: 25 U/L (ref 14–54)
ANION GAP: 8 (ref 5–15)
AST: 29 U/L (ref 15–41)
Albumin: 2.3 g/dL — ABNORMAL LOW (ref 3.5–5.0)
Alkaline Phosphatase: 114 U/L (ref 38–126)
BILIRUBIN TOTAL: 0.5 mg/dL (ref 0.3–1.2)
BUN: 11 mg/dL (ref 6–20)
CALCIUM: 8.9 mg/dL (ref 8.9–10.3)
CHLORIDE: 84 mmol/L — AB (ref 101–111)
CO2: 42 mmol/L — ABNORMAL HIGH (ref 22–32)
CREATININE: 0.81 mg/dL (ref 0.44–1.00)
Glucose, Bld: 106 mg/dL — ABNORMAL HIGH (ref 65–99)
POTASSIUM: 3 mmol/L — AB (ref 3.5–5.1)
Sodium: 134 mmol/L — ABNORMAL LOW (ref 135–145)
Total Protein: 6.7 g/dL (ref 6.5–8.1)

## 2015-12-19 LAB — CBC WITH DIFFERENTIAL/PLATELET
BASOS PCT: 0 %
Basophils Absolute: 0 10*3/uL (ref 0.0–0.1)
EOS ABS: 0.2 10*3/uL (ref 0.0–0.7)
EOS PCT: 1 %
HEMATOCRIT: 27.3 % — AB (ref 36.0–46.0)
Hemoglobin: 8 g/dL — ABNORMAL LOW (ref 12.0–15.0)
Lymphocytes Relative: 9 %
Lymphs Abs: 1.7 10*3/uL (ref 0.7–4.0)
MCH: 28.3 pg (ref 26.0–34.0)
MCHC: 29.3 g/dL — AB (ref 30.0–36.0)
MCV: 96.5 fL (ref 78.0–100.0)
MONO ABS: 1.7 10*3/uL — AB (ref 0.1–1.0)
MONOS PCT: 8 %
NEUTROS ABS: 16.8 10*3/uL — AB (ref 1.7–7.7)
Neutrophils Relative %: 82 %
PLATELETS: 223 10*3/uL (ref 150–400)
RBC: 2.83 MIL/uL — ABNORMAL LOW (ref 3.87–5.11)
RDW: 13.9 % (ref 11.5–15.5)
WBC: 20.5 10*3/uL — ABNORMAL HIGH (ref 4.0–10.5)

## 2015-12-19 LAB — URINALYSIS, ROUTINE W REFLEX MICROSCOPIC
BILIRUBIN URINE: NEGATIVE
Glucose, UA: NEGATIVE mg/dL
KETONES UR: NEGATIVE mg/dL
NITRITE: NEGATIVE
PH: 5.5 (ref 5.0–8.0)
PROTEIN: NEGATIVE mg/dL
Specific Gravity, Urine: <1.005 — ABNORMAL LOW (ref 1.005–1.030)

## 2015-12-19 LAB — URINE MICROSCOPIC-ADD ON

## 2015-12-19 LAB — I-STAT CG4 LACTIC ACID, ED
LACTIC ACID, VENOUS: 1.67 mmol/L (ref 0.5–1.9)
Lactic Acid, Venous: 1.58 mmol/L (ref 0.5–1.9)

## 2015-12-19 MED ORDER — ASPIRIN 325 MG PO TABS
325.0000 mg | ORAL_TABLET | Freq: Every evening | ORAL | Status: DC
  Administered 2015-12-19 – 2015-12-24 (×6): 325 mg via ORAL
  Filled 2015-12-19 (×5): qty 1

## 2015-12-19 MED ORDER — PANTOPRAZOLE SODIUM 40 MG PO TBEC
40.0000 mg | DELAYED_RELEASE_TABLET | Freq: Every day | ORAL | Status: DC
  Administered 2015-12-19 – 2015-12-25 (×7): 40 mg via ORAL
  Filled 2015-12-19 (×7): qty 1

## 2015-12-19 MED ORDER — SODIUM CHLORIDE 0.9 % IV BOLUS (SEPSIS)
1000.0000 mL | Freq: Once | INTRAVENOUS | Status: AC
  Administered 2015-12-19: 1000 mL via INTRAVENOUS

## 2015-12-19 MED ORDER — BUPROPION HCL ER (XL) 150 MG PO TB24
150.0000 mg | ORAL_TABLET | Freq: Every evening | ORAL | Status: DC
  Administered 2015-12-20 – 2015-12-24 (×5): 150 mg via ORAL
  Filled 2015-12-19 (×8): qty 1

## 2015-12-19 MED ORDER — IPRATROPIUM-ALBUTEROL 0.5-2.5 (3) MG/3ML IN SOLN
3.0000 mL | Freq: Four times a day (QID) | RESPIRATORY_TRACT | Status: DC
  Administered 2015-12-19: 3 mL via RESPIRATORY_TRACT
  Filled 2015-12-19: qty 3

## 2015-12-19 MED ORDER — ONDANSETRON HCL 4 MG PO TABS: 4.0000 mg | ORAL_TABLET | Freq: Four times a day (QID) | ORAL | Status: DC | PRN

## 2015-12-19 MED ORDER — ALBUTEROL SULFATE (2.5 MG/3ML) 0.083% IN NEBU
5.0000 mg | INHALATION_SOLUTION | Freq: Once | RESPIRATORY_TRACT | Status: AC
  Administered 2015-12-19: 5 mg via RESPIRATORY_TRACT
  Filled 2015-12-19: qty 6

## 2015-12-19 MED ORDER — VANCOMYCIN HCL 500 MG IV SOLR
500.0000 mg | INTRAVENOUS | Status: DC
  Administered 2015-12-20 – 2015-12-21 (×2): 500 mg via INTRAVENOUS
  Filled 2015-12-19 (×5): qty 500

## 2015-12-19 MED ORDER — AMITRIPTYLINE HCL 25 MG PO TABS
50.0000 mg | ORAL_TABLET | Freq: Every day | ORAL | Status: DC
  Administered 2015-12-20 – 2015-12-24 (×5): 50 mg via ORAL
  Filled 2015-12-19: qty 2
  Filled 2015-12-19: qty 1
  Filled 2015-12-19 (×2): qty 2
  Filled 2015-12-19 (×2): qty 1
  Filled 2015-12-19 (×2): qty 2

## 2015-12-19 MED ORDER — POTASSIUM CHLORIDE IN NACL 20-0.9 MEQ/L-% IV SOLN
INTRAVENOUS | Status: AC
  Administered 2015-12-19 – 2015-12-20 (×2): via INTRAVENOUS

## 2015-12-19 MED ORDER — POTASSIUM CHLORIDE 20 MEQ PO PACK
40.0000 meq | PACK | Freq: Once | ORAL | Status: AC
  Administered 2015-12-19: 40 meq via ORAL
  Filled 2015-12-19: qty 2

## 2015-12-19 MED ORDER — GUAIFENESIN 400 MG PO TABS: 400.0000 mg | ORAL_TABLET | Freq: Four times a day (QID) | ORAL | Status: DC

## 2015-12-19 MED ORDER — ALPRAZOLAM 0.5 MG PO TABS
0.5000 mg | ORAL_TABLET | Freq: Two times a day (BID) | ORAL | Status: DC | PRN
  Administered 2015-12-19 – 2015-12-22 (×5): 0.5 mg via ORAL
  Filled 2015-12-19 (×5): qty 1

## 2015-12-19 MED ORDER — BENZONATATE 100 MG PO CAPS
100.0000 mg | ORAL_CAPSULE | Freq: Once | ORAL | Status: AC
  Administered 2015-12-19: 100 mg via ORAL
  Filled 2015-12-19: qty 1

## 2015-12-19 MED ORDER — SODIUM CHLORIDE 0.9% FLUSH
3.0000 mL | Freq: Two times a day (BID) | INTRAVENOUS | Status: DC
  Administered 2015-12-19 – 2015-12-25 (×11): 3 mL via INTRAVENOUS

## 2015-12-19 MED ORDER — CEFEPIME HCL 1 G IJ SOLR
1.0000 g | Freq: Once | INTRAMUSCULAR | Status: AC
  Administered 2015-12-19: 1 g via INTRAVENOUS
  Filled 2015-12-19: qty 1

## 2015-12-19 MED ORDER — GUAIFENESIN ER 600 MG PO TB12
600.0000 mg | ORAL_TABLET | Freq: Two times a day (BID) | ORAL | Status: DC
  Administered 2015-12-19 – 2015-12-25 (×12): 600 mg via ORAL
  Filled 2015-12-19 (×12): qty 1

## 2015-12-19 MED ORDER — ONDANSETRON HCL 4 MG/2ML IJ SOLN: 4.0000 mg | Freq: Four times a day (QID) | INTRAMUSCULAR | Status: DC | PRN

## 2015-12-19 MED ORDER — ORAL CARE MOUTH RINSE
15.0000 mL | Freq: Two times a day (BID) | OROMUCOSAL | Status: DC
  Administered 2015-12-19 – 2015-12-24 (×10): 15 mL via OROMUCOSAL

## 2015-12-19 MED ORDER — IPRATROPIUM-ALBUTEROL 0.5-2.5 (3) MG/3ML IN SOLN
3.0000 mL | Freq: Four times a day (QID) | RESPIRATORY_TRACT | Status: DC
  Administered 2015-12-20 – 2015-12-24 (×17): 3 mL via RESPIRATORY_TRACT
  Filled 2015-12-19 (×18): qty 3

## 2015-12-19 MED ORDER — SODIUM CHLORIDE 0.9 % IV BOLUS (SEPSIS)
1000.0000 mL | Freq: Once | INTRAVENOUS | Status: DC
Start: 2015-12-19 — End: 2015-12-25

## 2015-12-19 MED ORDER — VANCOMYCIN HCL IN DEXTROSE 750-5 MG/150ML-% IV SOLN
750.0000 mg | Freq: Once | INTRAVENOUS | Status: AC
  Administered 2015-12-19: 750 mg via INTRAVENOUS
  Filled 2015-12-19: qty 150

## 2015-12-19 MED ORDER — MAGNESIUM SULFATE 2 GM/50ML IV SOLN
2.0000 g | Freq: Once | INTRAVENOUS | Status: AC
  Administered 2015-12-19: 2 g via INTRAVENOUS
  Filled 2015-12-19: qty 50

## 2015-12-19 MED ORDER — ALBUTEROL SULFATE (2.5 MG/3ML) 0.083% IN NEBU
2.5000 mg | INHALATION_SOLUTION | RESPIRATORY_TRACT | Status: DC | PRN
  Administered 2015-12-21: 2.5 mg via RESPIRATORY_TRACT
  Filled 2015-12-19: qty 3

## 2015-12-19 MED ORDER — METOPROLOL TARTRATE 50 MG PO TABS
50.0000 mg | ORAL_TABLET | Freq: Two times a day (BID) | ORAL | Status: DC
  Administered 2015-12-19 – 2015-12-25 (×12): 50 mg via ORAL
  Filled 2015-12-19 (×12): qty 1

## 2015-12-19 MED ORDER — ENOXAPARIN SODIUM 30 MG/0.3ML ~~LOC~~ SOLN
30.0000 mg | SUBCUTANEOUS | Status: DC
  Administered 2015-12-19 – 2015-12-24 (×6): 30 mg via SUBCUTANEOUS
  Filled 2015-12-19 (×6): qty 0.3

## 2015-12-19 MED ORDER — DEXTROSE 5 % IV SOLN: 1.0000 g | Freq: Once | INTRAVENOUS | Status: DC

## 2015-12-19 MED ORDER — ENSURE ENLIVE PO LIQD
237.0000 mL | Freq: Two times a day (BID) | ORAL | Status: DC
  Administered 2015-12-20 – 2015-12-25 (×10): 237 mL via ORAL

## 2015-12-19 MED ORDER — INFLUENZA VAC SPLIT QUAD 0.5 ML IM SUSY
0.5000 mL | PREFILLED_SYRINGE | INTRAMUSCULAR | Status: AC
  Administered 2015-12-20: 0.5 mL via INTRAMUSCULAR
  Filled 2015-12-19: qty 0.5

## 2015-12-19 MED ORDER — HYDROCODONE-ACETAMINOPHEN 10-325 MG PO TABS
1.0000 | ORAL_TABLET | Freq: Two times a day (BID) | ORAL | Status: DC
Start: 2015-12-19 — End: 2015-12-25
  Administered 2015-12-19 – 2015-12-25 (×10): 1 via ORAL
  Filled 2015-12-19 (×11): qty 1

## 2015-12-19 MED ORDER — METHYLPREDNISOLONE SODIUM SUCC 125 MG IJ SOLR
80.0000 mg | Freq: Once | INTRAMUSCULAR | Status: AC
  Administered 2015-12-19: 80 mg via INTRAVENOUS
  Filled 2015-12-19: qty 2

## 2015-12-19 MED ORDER — IPRATROPIUM BROMIDE 0.02 % IN SOLN: 0.5000 mg | Freq: Four times a day (QID) | RESPIRATORY_TRACT | Status: DC

## 2015-12-19 MED ORDER — POTASSIUM CHLORIDE IN NACL 20-0.9 MEQ/L-% IV SOLN
INTRAVENOUS | Status: DC
  Administered 2015-12-19: 20:00:00 via INTRAVENOUS

## 2015-12-19 MED ORDER — ALBUTEROL SULFATE (2.5 MG/3ML) 0.083% IN NEBU: 2.5000 mg | INHALATION_SOLUTION | Freq: Four times a day (QID) | RESPIRATORY_TRACT | Status: DC

## 2015-12-19 MED ORDER — LORATADINE 10 MG PO TABS
10.0000 mg | ORAL_TABLET | Freq: Every day | ORAL | Status: DC
  Administered 2015-12-19 – 2015-12-25 (×7): 10 mg via ORAL
  Filled 2015-12-19 (×7): qty 1

## 2015-12-19 NOTE — H&P (Signed)
History and Physical    SHANNIKA WINKLER [id number] DOB: July 05, 1945 DOA: 12/19/2015  PCP: Chevis Pretty, FNP   Patient coming from: Home.  Chief Complaint: Cough.  HPI: [name] is a 70 y.o. female with medical history significant of anxiety, atrial fibrillation, avascular necrosis of the hip (S/P TAH), hypertension, hyperglycemia, psoriasis who comes to the emergency department due to cough and shortness of breath.  Per patient's daughter, she was treated for pneumonia about 3 months ago. The patient and her husband last week were treated with Zithromax for respiratory symptoms. She states that her husband's symptoms have gotten somewhat better, but her symptoms have been getting worse. She reports progressively worse dyspnea since last week, increased green sputum production and occasional hemoptysis in the last 3 days, also night sweats, chills, subjective fever. Earlier today, while she was doing physical therapy, her dyspnea was so severe that she was unable to continue with it. Her home health nurse call her PCP, who suggested to the patient to come to the emergency department.   E D Course: patient received supplemental oxygen, bronchodilators, 2 L normal saline bolus and IV antibiotics. Workup shows the WBC of 20.5, hemoglobin level VIII.0, potassium level 3.0 mmol/L, urine analysis showed trace leukocyte esterase and hemoglobin. Chest radiograph shows increased density at the bases.  Review of Systems: As per HPI otherwise 10 point review of systems negative.    Past Medical History:  Diagnosis Date  . Anxiety disorder   . Atrial fibrillation ([place])   . Avascular necrosis of hip ([place])    left  . CHF (congestive heart failure) ([place])   . COPD (chronic obstructive pulmonary disease) ([place])   . ETOH abuse   . HTN (hypertension)   . Hyperglycemia   . On home O2    3L N/C  . Psoriasis     Past Surgical History:  Procedure Laterality Date  . JOINT REPLACEMENT     . TONSILLECTOMY    . TOTAL HIP ARTHROPLASTY  05-15-08   r hip     reports that she quit smoking about 6 years ago. She has a 35.00 pack-year smoking history. She has never used smokeless tobacco. She reports that she drinks alcohol. She reports that she does not use drugs.  Allergies  Allergen Reactions  . Celexa [Citalopram Hydrobromide]     Jerky movements  . Nickel   . Tramadol Hcl     REACTION: nausea and vomiting    Family History  Problem Relation Age of Onset  . Heart disease Father   . Hyperlipidemia Father   . Hypertension Father   . Heart attack Father   . Cancer Paternal Grandfather   . Alzheimer's disease Mother   . Diabetes Sister   . Hyperlipidemia Sister   . Hypertension Sister   . Heart attack Sister   . Peripheral vascular disease Sister   . Heart disease Brother   . Hyperlipidemia Brother   . Heart attack Brother     Prior to Admission medications   Medication Sig Start Date End Date Taking? Authorizing Provider  adalimumab (HUMIRA PEN) 40 MG/0.8ML injection Inject 40 mg into the skin every 14 (fourteen) days.    Yes Historical Provider, MD  albuterol (PROAIR HFA) 108 (90 BASE) MCG/ACT inhaler INHALE 2 PUFFS INTO THE LUNGS EVERY 6 HOURS AS NEEDED FOR WHEEZING OR SHORTNESS OF BREATH 03/07/15  Yes Mary-Margaret Hassell Done, FNP  albuterol (PROVENTIL) (2.5 MG/3ML) 0.083% nebulizer solution Take 2.5 mg by nebulization every morning.  Yes Historical Provider, MD  ALPRAZolam Duanne Moron) 0.5 MG tablet Take 1 tablet (0.5 mg total) by mouth 2 (two) times daily as needed for anxiety. 11/23/15  Yes Mary-Margaret Hassell Done, FNP  aspirin 325 MG tablet Take 325 mg by mouth every evening.    Yes Historical Provider, MD  buPROPion (WELLBUTRIN XL) 150 MG 24 hr tablet Take 1 tablet by mouth  daily Patient taking differently: Take 1 tablet by mouth  daily in the evening 12/11/15  Yes Mary-Margaret Hassell Done, FNP  cetirizine (ALL DAY ALLERGY) 10 MG tablet Take 10 mg by mouth at bedtime.     Yes Historical Provider, MD  cholecalciferol (VITAMIN D) 1000 UNITS tablet Take 1,000 Units by mouth every morning.    Yes Historical Provider, MD  EQL CALCIUM CITRATE/VITAMIN D PO Take 1 each by mouth 2 (two) times daily. Vitamin D 500IU with Calcium Citrate 630mg   Chewables   Yes Historical Provider, MD  furosemide (LASIX) 20 MG tablet Take 1 tablet by mouth  daily Patient taking differently: Take 1 tablet by mouth  daily in the morning 12/11/15  Yes Mary-Margaret Hassell Done, FNP  guaifenesin (MUCUS RELIEF) 400 MG TABS tablet Take 400 mg by mouth every morning.    Yes Historical Provider, MD  HYDROcodone-acetaminophen (NORCO) 10-325 MG tablet Take 1 tablet by mouth 2 (two) times daily. 11/23/15  Yes Mary-Margaret Hassell Done, FNP  metoprolol (LOPRESSOR) 50 MG tablet Take 1 tablet by mouth two  times daily 12/11/15  Yes Mary-Margaret Hassell Done, FNP  Potassium Gluconate 550 (90 K) MG TABS Take 1 tablet by mouth every morning.   Yes Historical Provider, MD  tiotropium (SPIRIVA) 18 MCG inhalation capsule Place 1 capsule (18 mcg total) into inhaler and inhale every morning. 06/26/15  Yes Mary-Margaret Hassell Done, FNP  vitamin C (ASCORBIC ACID) 500 MG tablet Take 500 mg by mouth every morning.   Yes Historical Provider, MD  amitriptyline (ELAVIL) 50 MG tablet Take 1 tablet by mouth at  bedtime 12/23/14   Mary-Margaret Hassell Done, FNP  azithromycin (ZITHROMAX) 250 MG tablet Two tablets day one, then one tablet daily next 4 days. Patient not taking: Reported on 12/19/2015 12/07/15   Mary-Margaret Hassell Done, FNP  cefTAZidime 2 g in dextrose 5 % 50 mL Inject 2 g into the vein every 12 (twelve) hours. For 5 more days Patient not taking: Reported on 11/23/2015 10/25/15   Kathie Dike, MD  Fluticasone Furoate-Vilanterol (BREO ELLIPTA) 100-25 MCG/INH AEPB Inhale 1 puff into the lungs daily.     Historical Provider, MD  HYDROcodone-acetaminophen (NORCO) 10-325 MG tablet Take 1 tablet by mouth every 8 (eight) hours as needed for moderate  pain. Patient not taking: Reported on 12/19/2015 10/25/15   Kathie Dike, MD  levalbuterol (XOPENEX) 0.63 MG/3ML nebulizer solution Take 3 mLs (0.63 mg total) by nebulization 3 (three) times daily. Patient not taking: Reported on 12/19/2015 10/25/15   Kathie Dike, MD    Physical Exam:  Constitutional: NAD, Looks chronically ill. Vitals:   12/19/15 1800 12/19/15 1847 12/19/15 1933 12/19/15 1959  BP: 111/65 110/81 127/79   Pulse: 94 100 99   Resp: 19 19 18    Temp:   99 F (37.2 C)   TempSrc:   Oral   SpO2: 94% 99% 96% 98%  Weight:   38.6 kg (85 lb 1.6 oz)   Height:   4\' 10"  (1.473 m)    Eyes: PERRL, lids and conjunctivae normal ENMT: Mucous membranes are mildly dry. Posterior pharynx clear of any exudate or lesions. Upper denture.  Neck: normal, supple, no masses, no thyromegaly Resp: Bilateral expiratory wheezing, bibasilar crackles, subtle ronchi. Normal respiratory effort. No accessory muscle use.  Cardiovascular: Regular rate and rhythm, no murmurs / rubs / gallops. No extremity edema. No carotid bruits.  Abdomen: Bowel sounds positive. Soft, no tenderness, no masses palpated. No hepatosplenomegaly.  Musculoskeletal: Mild clubbing, no cyanosis. Good ROM, no contractures. Normal muscle tone.  Skin: Multiple small areas of ecchymosis on extremities, several dress wounds on left anterior pretibial area and left heel. Neuurologic: CN 2-12 grossly intact. Sensation intact, DTR normal. Strength 5/5 in all 4.  Psychiatric: Normal judgment and insight. Alert and oriented x  4. Normal mood.    Labs on Admission: I have personally reviewed following labs and imaging studies  CBC:  Recent Labs Lab 12/19/15 1523  WBC 20.5*  NEUTROABS 16.8*  HGB 8.0*  HCT 27.3*  MCV 96.5  PLT [id number]   Basic Metabolic Panel:  Recent Labs Lab 12/19/15 1523  NA 134*  K 3.0*  CL 84*  CO2 42*  GLUCOSE 106*  BUN 11  CREATININE 0.81  CALCIUM 8.9   GFR: Estimated Creatinine Clearance: 38.8  mL/min (by C-G formula based on SCr of 0.81 mg/dL). Liver Function Tests:  Recent Labs Lab 12/19/15 1523  AST 29  ALT 25  ALKPHOS 114  BILITOT 0.5  PROT 6.7  ALBUMIN 2.3*   No results for input(s): LIPASE, AMYLASE in the last 168 hours. No results for input(s): AMMONIA in the last 168 hours. Coagulation Profile: No results for input(s): INR, PROTIME in the last 168 hours. Cardiac Enzymes: No results for input(s): CKTOTAL, CKMB, CKMBINDEX, TROPONINI in the last 168 hours. BNP (last 3 results) No results for input(s): PROBNP in the last 8760 hours. HbA1C: No results for input(s): HGBA1C in the last 72 hours. CBG: No results for input(s): GLUCAP in the last 168 hours. Lipid Profile: No results for input(s): CHOL, HDL, LDLCALC, TRIG, CHOLHDL, LDLDIRECT in the last 72 hours. Thyroid Function Tests: No results for input(s): TSH, T4TOTAL, FREET4, T3FREE, THYROIDAB in the last 72 hours. Anemia Panel: No results for input(s): VITAMINB12, FOLATE, FERRITIN, TIBC, IRON, RETICCTPCT in the last 72 hours. Urine analysis:    Component Value Date/Time   COLORURINE YELLOW 12/19/2015 1506   APPEARANCEUR CLEAR 12/19/2015 1506   LABSPEC <1.005 (L) 12/19/2015 1506   PHURINE 5.5 12/19/2015 1506   GLUCOSEU NEGATIVE 12/19/2015 1506   HGBUR TRACE (A) 12/19/2015 1506   BILIRUBINUR NEGATIVE 12/19/2015 1506   KETONESUR NEGATIVE 12/19/2015 1506   PROTEINUR NEGATIVE 12/19/2015 1506   UROBILINOGEN 0.2 07/29/2013 1915   NITRITE NEGATIVE 12/19/2015 1506   LEUKOCYTESUR TRACE (A) 12/19/2015 1506    Radiological Exams on Admission: Dg Chest 2 View  Result Date: 12/19/2015 CLINICAL DATA:  Recent episode of pneumonia. Patient reports feeling bad for the past several days. Cough and weakness. History of COPD, former smoker, atrial fibrillation, CHF, on home oxygen. EXAM: CHEST  2 VIEW COMPARISON:  PA and lateral chest x-ray of November 08, 2015 FINDINGS: There is extensive scarring which appears stable. A  fibrocavitary lesion in the right apex is stable. There are mildly increased lung markings at both bases as compared to the previous study. The heart is normal in size. The central pulmonary vascularity is prominent but stable. Retraction of the hilar structures superiorly is stable. There is calcification in the wall of the aortic arch. There is multilevel degenerative disc disease of the thoracic spine. IMPRESSION: Extensive chronic bronchitic changes with parenchymal scarring.  Mildly increased density at the lung bases suggest superimposed pneumonia. No evidence of CHF. Followup PA and lateral chest X-ray is recommended in 3-4 weeks following trial of antibiotic therapy to ensure resolution and exclude underlying malignancy. Aortic atherosclerosis. Electronically Signed   By: Alania Overholt  [place] M.D.   On: 12/19/2015 16:13    EKG: Independently reviewed.  Vent. rate 88 BPM PR interval * ms QRS duration 88 ms QT/QTc 391/474 ms P-R-T axes 100 84 87 Sinus arrhythmia Borderline right axis deviation Nonspecific T abnormalities, lateral leads Abnormal ekg No significant change when compared to previous.  Assessment/Plan Principal Problem:   HCAP (healthcare-associated pneumonia) Admit to stepdown/inpatient. Continue supplemental oxygen. Continue scheduled and as needed bronchodilators. Continue cefepime and vancomycin per pharmacy. Check a sputum Gram stain, culture and sensitivity. Check strep pneumoniae urine antigen. Follow-up blood cultures and sensitivity.  Active Problems:   COPD exacerbation (HCC) Continue supplemental oxygen. Single dose Solu-Medrol 80 mg IVP. Continue scheduled and as needed beta agonist/ipratropium.    Hypercarbia Hold furosemide for now and recheck CO2 in a.m. Consider adding Diamox if persistent.    Hypokalemia Replacing. Follow-up level in the morning.    HTN (hypertension) Continue Lopressor 50 mg by mouth twice a day. Monitor blood pressure  periodically.    RA (rheumatoid arthritis) (HCC) Hold Humira until the patient's pneumonia is treated.    Chronic diastolic CHF (congestive heart failure) ([place]) Patient seems to be volume depleted. Continue metoprolol  and hold furosemide for now.  Monitor intake and output.   DVT prophylaxis: Lovenox SQ. Code Status: Full code. Family Communication: Her daughter and spouse are present in the room. Disposition Plan: Admit for IV antibiotic therapy for several days. Consults called:  Admission status: Inpatient/telemetry.   Reubin Milan MD [facility] Pager [phone]  If 7PM-7AM, please contact night-coverage [email or web address] Password [email or web address]  12/19/2015, 8:21 PM

## 2015-12-19 NOTE — Progress Notes (Signed)
Pharmacy Antibiotic Note  [name] is a 70 y.o. female admitted on 12/19/2015 with pneumonia.  Pharmacy has been consulted for vancomycin dosing.  Plan: Vanc 750 mg IV x 1 then 500 mg IV q24 hours F/u renal function, cultures and clinical course  Height: 4\' 10"  (147.3 cm) Weight: 85 lb (38.6 kg) IBW/kg (Calculated) : 40.9  Temp (24hrs), Avg:98.7 F (37.1 C), Min:98.1 F (36.7 C), Max:99.6 F (37.6 C)   Recent Labs Lab 12/19/15 1523 12/19/15 1536  WBC 20.5*  --   CREATININE 0.81  --   LATICACIDVEN  --  1.67    Estimated Creatinine Clearance: 38.8 mL/min (by C-G formula based on SCr of 0.81 mg/dL).    Allergies  Allergen Reactions  . Celexa [Citalopram Hydrobromide]     Jerky movements  . Nickel   . Tramadol Hcl     REACTION: nausea and vomiting    Antimicrobials this admission: vanc 10/3 >>  Cefepime x 1 dose 10/3   Thank you for allowing pharmacy to be a part of this patient's care.  Excell Seltzer Poteet 12/19/2015 5:57 PM

## 2015-12-19 NOTE — ED Triage Notes (Signed)
Pt's daughter states pt was recently treated for pneumonia and has been feeling bad for the past few days.  States her home health nurse called her pcp who told her to come be checked.  States she continues to cough and be weak.

## 2015-12-19 NOTE — ED Notes (Signed)
Per MD Sabra Heck to give one bolus of NS; Maxipime given at 1631.

## 2015-12-19 NOTE — ED Notes (Signed)
Pt returned from xray at this time.

## 2015-12-19 NOTE — ED Provider Notes (Signed)
[facility] DEPT Provider Note   CSN: [id number] Arrival date & time: 12/19/15  1452     History   Chief Complaint Chief Complaint  Patient presents with  . Cough    HPI [name] is a 70 y.o. female.  The pt was admit 2 months ago for pneumonia - was intubated, treated for COPD as well - spent extended stay in hospital - was sent to NH at [facility] and now returns to ED after being home for a couple of weeks after completing rehab.  She has had increased SOB and some coughing with ongoing production of purulent sputum and having a HH nurse evaluate her today - thought she was too SOB and sent her to the ED.  Pt has had lots of blood sputum as well.  She has been ambulatary but is very week.  Sx ar persistent, nothing seems to make this better or worse.  Husband and daughter and EMR are historians    Cough       Past Medical History:  Diagnosis Date  . Anxiety disorder   . Atrial fibrillation ([place])   . Avascular necrosis of hip ([place])    left  . CHF (congestive heart failure) ([place])   . COPD (chronic obstructive pulmonary disease) ([place])   . ETOH abuse   . HTN (hypertension)   . Hyperglycemia   . On home O2    3L N/C  . Psoriasis     Patient Active Problem List   Diagnosis Date Noted  . HCAP (healthcare-associated pneumonia) 12/19/2015  . Protein-calorie malnutrition, severe 10/18/2015  . Acute and chronic respiratory failure with hypercapnia ([place]) 10/15/2015  . Anxiety 10/15/2015  . Chronic diastolic CHF (congestive heart failure) ([place]) 10/15/2015  . Macrocytic anemia 10/15/2015  . Acute respiratory failure with hypoxia and hypercapnia (H[facility])   . Atherosclerotic peripheral vascular disease with intermittent claudication ([place]) 06/09/2014  . FTT (failure to thrive) in adult 07/31/2013  . CHF (congestive heart failure) ([place]) 06/10/2013  . 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 Osteopenia 06/16/2010  . Hyperlipidemia 06/16/2010  . HTN (hypertension)  06/16/2010  . Insomnia 06/16/2010  . RA (rheumatoid arthritis) ([place]) 06/16/2010  . RHEUMATIC FEVER, HX OF 04/20/2009  . COMPUTERIZED TOMOGRAPHY, CHEST, ABNORMAL 04/20/2009  . COPD bronchitis 04/18/2009    Past Surgical History:  Procedure Laterality Date  . JOINT REPLACEMENT    . TONSILLECTOMY    . TOTAL HIP ARTHROPLASTY  05-15-08   r hip    OB History    No data available       Home Medications    Prior to Admission medications   Medication Sig Start Date End Date Taking? Authorizing Provider  adalimumab (HUMIRA PEN) 40 MG/0.8ML injection Inject 40 mg into the skin every 14 (fourteen) days.    Yes Historical Provider, MD  albuterol (PROAIR HFA) 108 (90 BASE) MCG/ACT inhaler INHALE 2 PUFFS INTO THE LUNGS EVERY 6 HOURS AS NEEDED FOR WHEEZING OR SHORTNESS OF BREATH 03/07/15  Yes Mary-Margaret Hassell Done, FNP  albuterol (PROVENTIL) (2.5 MG/3ML) 0.083% nebulizer solution Take 2.5 mg by nebulization every morning.   Yes Historical Provider, MD  ALPRAZolam Duanne Moron) 0.5 MG tablet Take 1 tablet (0.5 mg total) by mouth 2 (two) times daily as needed for anxiety. 11/23/15  Yes Mary-Margaret Hassell Done, FNP  aspirin 325 MG tablet Take 325 mg by mouth every evening.    Yes Historical Provider, MD  buPROPion (WELLBUTRIN XL) 150 MG 24 hr tablet Take 1 tablet by mouth  daily Patient taking differently: Take 1 tablet by mouth  daily in the evening 12/11/15  Yes Mary-Margaret Hassell Done, FNP  cetirizine (ALL DAY ALLERGY) 10 MG tablet Take 10 mg by mouth at bedtime.    Yes Historical Provider, MD  cholecalciferol (VITAMIN D) 1000 UNITS tablet Take 1,000 Units by mouth every morning.    Yes Historical Provider, MD  EQL CALCIUM CITRATE/VITAMIN D PO Take 1 each by mouth 2 (two) times daily. Vitamin D 500IU with Calcium Citrate 630mg   Chewables   Yes Historical Provider, MD  furosemide (LASIX) 20 MG tablet Take 1 tablet by mouth  daily Patient taking differently: Take 1 tablet by mouth  daily in the morning 12/11/15   Yes Mary-Margaret Hassell Done, FNP  guaifenesin (MUCUS RELIEF) 400 MG TABS tablet Take 400 mg by mouth every morning.    Yes Historical Provider, MD  HYDROcodone-acetaminophen (NORCO) 10-325 MG tablet Take 1 tablet by mouth 2 (two) times daily. 11/23/15  Yes Mary-Margaret Hassell Done, FNP  metoprolol (LOPRESSOR) 50 MG tablet Take 1 tablet by mouth two  times daily 12/11/15  Yes Mary-Margaret Hassell Done, FNP  Potassium Gluconate 550 (90 K) MG TABS Take 1 tablet by mouth every morning.   Yes Historical Provider, MD  tiotropium (SPIRIVA) 18 MCG inhalation capsule Place 1 capsule (18 mcg total) into inhaler and inhale every morning. 06/26/15  Yes Mary-Margaret Hassell Done, FNP  vitamin C (ASCORBIC ACID) 500 MG tablet Take 500 mg by mouth every morning.   Yes Historical Provider, MD  amitriptyline (ELAVIL) 50 MG tablet Take 1 tablet by mouth at  bedtime 12/23/14   Mary-Margaret Hassell Done, FNP  azithromycin (ZITHROMAX) 250 MG tablet Two tablets day one, then one tablet daily next 4 days. Patient not taking: Reported on 12/19/2015 12/07/15   Mary-Margaret Hassell Done, FNP  cefTAZidime 2 g in dextrose 5 % 50 mL Inject 2 g into the vein every 12 (twelve) hours. For 5 more days Patient not taking: Reported on 11/23/2015 10/25/15   Kathie Dike, MD  Fluticasone Furoate-Vilanterol (BREO ELLIPTA) 100-25 MCG/INH AEPB Inhale 1 puff into the lungs daily.     Historical Provider, MD  HYDROcodone-acetaminophen (NORCO) 10-325 MG tablet Take 1 tablet by mouth every 8 (eight) hours as needed for moderate pain. Patient not taking: Reported on 12/19/2015 10/25/15   Kathie Dike, MD  levalbuterol (XOPENEX) 0.63 MG/3ML nebulizer solution Take 3 mLs (0.63 mg total) by nebulization 3 (three) times daily. Patient not taking: Reported on 12/19/2015 10/25/15   Kathie Dike, MD    Family History Family History  Problem Relation Age of Onset  . Heart disease Father   . Hyperlipidemia Father   . Hypertension Father   . Heart attack Father   . Cancer Paternal  Grandfather   . Alzheimer's disease Mother   . Diabetes Sister   . Hyperlipidemia Sister   . Hypertension Sister   . Heart attack Sister   . Peripheral vascular disease Sister   . Heart disease Brother   . Hyperlipidemia Brother   . Heart attack Brother     Social History Social History  Substance Use Topics  . Smoking status: Former Smoker    Packs/day: 1.00    Years: 35.00    Quit date: 02/15/2009  . Smokeless tobacco: Never Used  . Alcohol use 0.0 oz/week     Comment: hx of abuse and withdrawal     Allergies   Celexa [citalopram hydrobromide]; Nickel; and Tramadol hcl   Review of Systems Review of Systems  Respiratory: Positive  for cough.   All other systems reviewed and are negative.    Physical Exam Updated Vital Signs BP 122/66 (BP Location: Left Arm)   Pulse 95   Temp 98.1 F (36.7 C) (Oral)   Resp 20   Ht 4\' 10"  (1.473 m)   Wt 85 lb (38.6 kg)   SpO2 95%   BMI 17.77 kg/m   Physical Exam  Constitutional: She appears well-developed and well-nourished. No distress.  HENT:  Head: Normocephalic and atraumatic.  Mouth/Throat: Oropharynx is clear and moist. No oropharyngeal exudate.  Eyes: Conjunctivae and EOM are normal. Pupils are equal, round, and reactive to light. Right eye exhibits no discharge. Left eye exhibits no discharge. No scleral icterus.  Neck: Normal range of motion. Neck supple. No JVD present. No thyromegaly present.  Cardiovascular: Normal rate, regular rhythm, normal heart sounds and intact distal pulses.  Exam reveals no gallop and no friction rub.   No murmur heard. Pulmonary/Chest: No respiratory distress. She has wheezes. She has no rales.  The patient has diffuse prolonged expiratory wheezing and prolonged expiratory phase in all 4 lung fields both anterior and posterior, no rales, mild tachypnea, no accessory muscle use, occasional coughing during exam  Abdominal: Soft. Bowel sounds are normal. She exhibits no distension and no  mass. There is no tenderness.  Musculoskeletal: Normal range of motion. She exhibits no edema or tenderness.  No edema, multiple mild skin tears to the legs  Lymphadenopathy:    She has no cervical adenopathy.  Neurological: She is alert. Coordination normal.  Speech is clear, memory is intact, follows commands without difficulty in all 4 extremities, no facial droop  Skin: Skin is warm and dry. No rash noted. No erythema.  Psychiatric: She has a normal mood and affect. Her behavior is normal.  Nursing note and vitals reviewed.    ED Treatments / Results  Labs (all labs ordered are listed, but only abnormal results are displayed) Labs Reviewed  COMPREHENSIVE METABOLIC PANEL - Abnormal; Notable for the following:       Result Value   Sodium 134 (*)    Potassium 3.0 (*)    Chloride 84 (*)    CO2 42 (*)    Glucose, Bld 106 (*)    Albumin 2.3 (*)    All other components within normal limits  CBC WITH DIFFERENTIAL/PLATELET - Abnormal; Notable for the following:    WBC 20.5 (*)    RBC 2.83 (*)    Hemoglobin 8.0 (*)    HCT 27.3 (*)    MCHC 29.3 (*)    Neutro Abs 16.8 (*)    Monocytes Absolute 1.7 (*)    All other components within normal limits  URINALYSIS, ROUTINE W REFLEX MICROSCOPIC (NOT AT [facility]) - Abnormal; Notable for the following:    Specific Gravity, Urine <1.005 (*)    Hgb urine dipstick TRACE (*)    Leukocytes, UA TRACE (*)    All other components within normal limits  URINE MICROSCOPIC-ADD ON - Abnormal; Notable for the following:    Squamous Epithelial / LPF 0-5 (*)    Bacteria, UA RARE (*)    All other components within normal limits  URINE CULTURE  CULTURE, BLOOD (ROUTINE X 2)  CULTURE, BLOOD (ROUTINE X 2)  I-STAT CG4 LACTIC ACID, ED  I-STAT CG4 LACTIC ACID, ED    EKG  EKG Interpretation  Date/Time:  Tuesday December 19 2015 15:09:24 EDT Ventricular Rate:  88 PR Interval:    QRS Duration: 88 QT  Interval:  391 QTC Calculation: 474 R Axis:   84 Text  Interpretation:  Sinus arrhythmia Borderline right axis deviation Nonspecific T abnormalities, lateral leads Abnormal ekg Since last tracing rate slower Confirmed by Sabra Heck  MD, Racer Quam ([id number]) on 12/19/2015 3:23:15 PM       Radiology Dg Chest 2 View  Result Date: 12/19/2015 CLINICAL DATA:  Recent episode of pneumonia. Patient reports feeling bad for the past several days. Cough and weakness. History of COPD, former smoker, atrial fibrillation, CHF, on home oxygen. EXAM: CHEST  2 VIEW COMPARISON:  PA and lateral chest x-ray of November 08, 2015 FINDINGS: There is extensive scarring which appears stable. A fibrocavitary lesion in the right apex is stable. There are mildly increased lung markings at both bases as compared to the previous study. The heart is normal in size. The central pulmonary vascularity is prominent but stable. Retraction of the hilar structures superiorly is stable. There is calcification in the wall of the aortic arch. There is multilevel degenerative disc disease of the thoracic spine. IMPRESSION: Extensive chronic bronchitic changes with parenchymal scarring. Mildly increased density at the lung bases suggest superimposed pneumonia. No evidence of CHF. Followup PA and lateral chest X-ray is recommended in 3-4 weeks following trial of antibiotic therapy to ensure resolution and exclude underlying malignancy. Aortic atherosclerosis. Electronically Signed   By: David  Martinique M.D.   On: 12/19/2015 16:13    Procedures Procedures (including critical care time)  Medications Ordered in ED Medications  vancomycin (VANCOCIN) IVPB 750 mg/150 ml premix (750 mg Intravenous Given 12/19/15 1716)  vancomycin (VANCOCIN) 500 mg in sodium chloride 0.9 % 100 mL IVPB (not administered)  sodium chloride 0.9 % bolus 1,000 mL (not administered)  ceFEPIme (MAXIPIME) 1 g in dextrose 5 % 50 mL IVPB (not administered)  sodium chloride 0.9 % bolus 1,000 mL (not administered)  ceFEPIme (MAXIPIME) 1 g in  dextrose 5 % 50 mL IVPB (0 g Intravenous Stopped 12/19/15 1717)  benzonatate (TESSALON) capsule 100 mg (100 mg Oral Given 12/19/15 1715)  albuterol (PROVENTIL) (2.5 MG/3ML) 0.083% nebulizer solution 5 mg (5 mg Nebulization Given 12/19/15 1727)     Initial Impression / Assessment and Plan / ED Course  I have reviewed the triage vital signs and the nursing notes.  Pertinent labs & imaging results that were available during my care of the patient were reviewed by me and considered in my medical decision making (see chart for details).  Clinical Course    Overall the patient does appear to have a decompensation in her respiratory status. Oxygenation was 91% on arrival, no fever, no hypoxia, no tachycardia. She will need further evaluation with chest x-ray, labs, cardiac monitoring, EKG does not show ischemia.  Labs show a significant increase in white blood cells with a large leukocytosis, her oxygen dropped to 84% on room air. I believe that she does have sepsis from a pulmonary infection, she has been given appropriate antibiotics for a healthcare associated pneumonia, fluids however her lactic acid is in a normal range. The patient is critically ill with a significant pneumonia and will need admission to the hospitalist service, discussed with Dr. Olevia Bowens who is in agreement.  CRITICAL CARE Performed by: Johnna Acosta Total critical care time: 35 minutes Critical care time was exclusive of separately billable procedures and treating other patients. Critical care was necessary to treat or prevent imminent or life-threatening deterioration. Critical care was time spent personally by me on the following activities: development of treatment plan  with patient and/or surrogate as well as nursing, discussions with consultants, evaluation of patient's response to treatment, examination of patient, obtaining history from patient or surrogate, ordering and performing treatments and interventions, ordering  and review of laboratory studies, ordering and review of radiographic studies, pulse oximetry and re-evaluation of patient's condition.   Final Clinical Impressions(s) / ED Diagnoses   Final diagnoses:  Sepsis, due to unspecified organism ([place])  HCAP (healthcare-associated pneumonia)    New Prescriptions New Prescriptions   No medications on file     Noemi Chapel, MD 12/19/15 1806

## 2015-12-19 NOTE — Telephone Encounter (Signed)
Pt has gone to ER - per MMM and [facility] nurse - spoke with her.

## 2015-12-19 NOTE — ED Notes (Signed)
Hospitalitis at bedside

## 2015-12-20 DIAGNOSIS — I5032 Chronic diastolic (congestive) heart failure: Secondary | ICD-10-CM

## 2015-12-20 DIAGNOSIS — J441 Chronic obstructive pulmonary disease with (acute) exacerbation: Secondary | ICD-10-CM

## 2015-12-20 LAB — STREP PNEUMONIAE URINARY ANTIGEN: STREP PNEUMO URINARY ANTIGEN: NEGATIVE

## 2015-12-20 LAB — CBC WITH DIFFERENTIAL/PLATELET
BASOS ABS: 0 10*3/uL (ref 0.0–0.1)
Basophils Relative: 0 %
Eosinophils Absolute: 0 10*3/uL (ref 0.0–0.7)
Eosinophils Relative: 0 %
HEMATOCRIT: 26.9 % — AB (ref 36.0–46.0)
HEMOGLOBIN: 7.8 g/dL — AB (ref 12.0–15.0)
LYMPHS ABS: 0.7 10*3/uL (ref 0.7–4.0)
LYMPHS PCT: 7 %
MCH: 28.4 pg (ref 26.0–34.0)
MCHC: 29 g/dL — ABNORMAL LOW (ref 30.0–36.0)
MCV: 97.8 fL (ref 78.0–100.0)
Monocytes Absolute: 0.2 10*3/uL (ref 0.1–1.0)
Monocytes Relative: 2 %
NEUTROS ABS: 8.9 10*3/uL — AB (ref 1.7–7.7)
NEUTROS PCT: 91 %
Platelets: 347 10*3/uL (ref 150–400)
RBC: 2.75 MIL/uL — AB (ref 3.87–5.11)
RDW: 14 % (ref 11.5–15.5)
WBC: 9.7 10*3/uL (ref 4.0–10.5)

## 2015-12-20 LAB — BASIC METABOLIC PANEL
ANION GAP: 5 (ref 5–15)
BUN: 12 mg/dL (ref 6–20)
CHLORIDE: 90 mmol/L — AB (ref 101–111)
CO2: 41 mmol/L — ABNORMAL HIGH (ref 22–32)
Calcium: 8.5 mg/dL — ABNORMAL LOW (ref 8.9–10.3)
Creatinine, Ser: 0.75 mg/dL (ref 0.44–1.00)
GFR calc Af Amer: >60 mL/min (ref >60)
GLUCOSE: 242 mg/dL — AB (ref 65–99)
POTASSIUM: 4 mmol/L (ref 3.5–5.1)
Sodium: 136 mmol/L (ref 135–145)

## 2015-12-20 LAB — MRSA PCR SCREENING: MRSA BY PCR: POSITIVE — AB

## 2015-12-20 MED ORDER — ADULT MULTIVITAMIN W/MINERALS CH
1.0000 | ORAL_TABLET | Freq: Every day | ORAL | Status: DC
  Administered 2015-12-20 – 2015-12-25 (×6): 1 via ORAL
  Filled 2015-12-20 (×6): qty 1

## 2015-12-20 MED ORDER — MUPIROCIN 2 % EX OINT
1.0000 "application " | TOPICAL_OINTMENT | Freq: Two times a day (BID) | CUTANEOUS | Status: AC
  Administered 2015-12-20 – 2015-12-24 (×10): 1 via NASAL
  Filled 2015-12-20 (×5): qty 22

## 2015-12-20 MED ORDER — FUROSEMIDE 20 MG PO TABS
20.0000 mg | ORAL_TABLET | Freq: Every day | ORAL | Status: DC
  Administered 2015-12-20 – 2015-12-25 (×6): 20 mg via ORAL
  Filled 2015-12-20 (×6): qty 1

## 2015-12-20 MED ORDER — DEXTROSE 5 % IV SOLN
1.0000 g | INTRAVENOUS | Status: DC
  Administered 2015-12-20 – 2015-12-24 (×5): 1 g via INTRAVENOUS
  Filled 2015-12-20 (×6): qty 1

## 2015-12-20 MED ORDER — MUPIROCIN CALCIUM 2 % EX CREA
TOPICAL_CREAM | Freq: Every day | CUTANEOUS | Status: DC
  Administered 2015-12-20 – 2015-12-24 (×5): via TOPICAL
  Administered 2015-12-25: 1 via TOPICAL
  Filled 2015-12-20: qty 15

## 2015-12-20 MED ORDER — CHLORHEXIDINE GLUCONATE CLOTH 2 % EX PADS
6.0000 | MEDICATED_PAD | Freq: Every day | CUTANEOUS | Status: AC
  Administered 2015-12-20 – 2015-12-24 (×4): 6 via TOPICAL

## 2015-12-20 MED ORDER — PRO-STAT SUGAR FREE PO LIQD
30.0000 mL | Freq: Two times a day (BID) | ORAL | Status: DC
  Administered 2015-12-20 – 2015-12-25 (×10): 30 mL via ORAL
  Filled 2015-12-20 (×11): qty 30

## 2015-12-20 NOTE — Progress Notes (Signed)
Initial Nutrition Assessment  DOCUMENTATION CODES:  Underweight, Severe malnutrition in context of chronic illness   Pt meets criteria for SEVERE MALNUTRITION in the context of Chronic Illness as evidenced by a PO intake that met < or equal to 75% of needs for > or equal to 1 month and a weight loss of nearly 7.5% in 2 months.  INTERVENTION:  Ensure Enlive po BID, each supplement provides 350 kcal and 20 grams of protein  Will order 30 mL Prostat BID, each supplement provides 100 kcal and 15 grams of protein.  Education on high pro/kcal diet  Will try to add cheese to her meal trays.   MVI with minerals to support wound healing in setting of poor PO intake  NUTRITION DIAGNOSIS:  Increased nutrient needs related to catabolic illness (end stage copd), wound healing, acute illness as evidenced by loss of nearly 7.5% bw in 2 months  GOAL:  Patient will meet greater than or equal to 90% of their needs  MONITOR:  PO intake, Supplement acceptance, Labs  REASON FOR ASSESSMENT:  Malnutrition Screening Tool    ASSESSMENT:  70 y/o female PMHx anxiety, afib, HTN, Chronic respiratory failure, COPD on home 02, CHF.  Presents with severe SOB and cough. Worked up for HCAP.   Of note, Pt was Admitted two months ago for similar circumstances requiring intubation.   Pt reports a chronic loss of appetite, so severe that she sometimes will get up in the morning, have some coffee with a biscuit, and then not eat or just snack the rest of the day.   She admits that she would eat more "If I had someone to make it". She says she is unable to prepare meals due to her poor health.   She denies any dyspnea with PO intake and says she is able to eat and breath at the same time. She drinks 1 Ensure a day at home, but she uses it as a meal replacement instead of a supplement.   She reported a UBW of 130 lbs though she doesn't appeared to have weighed this much in the last few years. She has lost about  7.4% bw in the last 2 months.    RD stressed importance of PO intake for weight maintenance and wound healing. Recommended that at home she consume the Ensure while still eating a dinner. We discussed high protein/kcal items such as PB or cheese and how to incorporate these into her snacks. She is advised to keep items in her house that take little preparation. A PB sandwich was a good example that she already eats.   NFPE: Severe muscle wasting of quadriceps and moderate of gastrocnemius. Moderate interosseous muscle  Wasting. Moderate thoracic and tricep fat loss.   Medications: Hydrocodone, IV abx, ppi, vanc, NS w/ K Labs: Hyperglycemic (had received 1x pred), H/H:7.8/26.9, WBC: 20.5, albumin: 2.3, CO2: 42  Recent Labs Lab 12/19/15 1523 12/20/15 0627  NA 134* 136  K 3.0* 4.0  CL 84* 90*  CO2 42* 41*  BUN 11 12  CREATININE 0.81 0.75  CALCIUM 8.9 8.5*  GLUCOSE 106* 242*   Diet Order:  Diet Heart Room service appropriate? Yes; Fluid consistency: Thin  Skin: MSAD to Groin, Buttocks, Sacrum. Multiple skin tears +abrasions to legs. Unstageable to heel.   Last BM:  Unknown  Height:  Ht Readings from Last 1 Encounters:  12/19/15 4' 10" (1.473 m)   Weight:  Wt Readings from Last 1 Encounters:  12/19/15 85 lb 1.6 oz (  38.6 kg)   Wt Readings from Last 10 Encounters:  12/19/15 85 lb 1.6 oz (38.6 kg)  11/08/15 85 lb (38.6 kg)  10/25/15 90 lb 8 oz (41.1 kg)  12/23/14 101 lb (45.8 kg)  11/11/14 102 lb (46.3 kg)  06/09/14 110 lb (49.9 kg)  05/25/14 110 lb (49.9 kg)  04/14/14 111 lb (50.3 kg)  04/07/14 112 lb (50.8 kg)  03/31/14 110 lb (49.9 kg)   Ideal Body Weight:  43.9 kg  BMI:  Body mass index is 17.79 kg/m.  Estimated Nutritional Needs:  Kcal:  1350-1550 kcals (35-40 kcals/kg bw) Protein:  58-70 g Pro (1.5-1.8 g/kg bw) Fluid:  1.4-1.6 liters  EDUCATION NEEDS:  Education needs addressed  Burtis Junes RD, LDN, CNSC Clinical Nutrition Pager: [phone] 12/20/2015 1:16  PM

## 2015-12-20 NOTE — Care Management Important Message (Signed)
Important Message  Patient Details  Name: [name] MRN: [id number] Date of Birth: Dec 05, 1945   Medicare Important Message Given:  Yes    Sherald Barge, RN 12/20/2015, 2:04 PM

## 2015-12-20 NOTE — Care Management Note (Signed)
Case Management Note  Patient Details  Name: [name] MRN: [id number] Date of Birth: 13-Feb-1946  Subjective/Objective:                  Pt admitted with HCAP. She is from home, lives with her husband and reports being ind with ADL's. She reports no DME for ambulation. She has home oxygen and neb machine PTA. She is active with [facility]. Tim Justis, of [facility], is aware of admission. She plans to return home with resumption of those services.   Action/Plan: Will cont to follow for DC planning needs.   Expected Discharge Date:    12/24/2015              Expected Discharge Plan:  [place]  In-House Referral:  NA  Discharge planning Services  CM Consult  Post Acute Care Choice:  Home Health, Resumption of Svcs/PTA Provider Choice offered to:  Patient  DME Arranged:    DME Agency:     HH Arranged:  RN, PT [place] Agency:  [facility] (formerly [employer])  Status of Service:  In process, will continue to follow  If discussed at Long Length of Stay Meetings, dates discussed:    Additional Comments:  Sherald Barge, RN 12/20/2015, 2:05 PM

## 2015-12-20 NOTE — Consult Note (Signed)
[facility] Nurse wound consult note Reason for Consult: Traumatic abrasions to bilateral lower legs.  Unstageable pressure injury to right heel, present on admission.  Has [facility] nurse that comes to do wound care.  Wound type:Trauma and pressure to right heel . Pressure Ulcer POA: Yes Measurement:Right heel 1 cm diameter scabbed lesion  Wound [id number] pink nongranulating to bilateral lower legs.  Drainage (amount, consistency, odor) Scant serosanguinous.  No odor.  Periwound:Intact Dressing procedure/placement/frequency:Cleanse wounds to right heel and bilateral lower legs with NS and pat gently dry.  Apply Bactroban cream daily to wound bed.  Cover with silicone border foam. Change foam every 3 days.   Will not follow at this time.  Please re-consult if needed.  Domenic Moras RN BSN [place] Pager [phone]

## 2015-12-20 NOTE — Progress Notes (Signed)
PROGRESS NOTE    [name]  [id number] DOB: 06-26-1945 DOA: 12/19/2015 PCP: Chevis Pretty, FNP     Brief Narrative:  70 year old woman admitted on 10/3 from home with complaints of cough and shortness of breath. She was found to have pneumonia that is presumed to be hospital-acquired given her recent hospitalization at the end of August.   Assessment & Plan:   Principal Problem:   HCAP (healthcare-associated pneumonia) Active Problems:   COPD exacerbation (HCC)   HTN (hypertension)   RA (rheumatoid arthritis) ([place])   Chronic diastolic CHF (congestive heart failure) ([place])   Hypercarbia   Hypokalemia   Healthcare associated pneumonia -I agree with continued broad-spectrum antibiotic therapy for now. Will check MRSA PCR and if negative will DC vancomycin. -Culture data is currently pending. -She is already symptomatically improved, as long as she remains afebrile, white count continues to decrease and she is symptomatically improved may consider discharge home over the next 24-48 hours.  COPD with acute exacerbation -Seems improved, not currently receiving steroids, do not believe she needs steroids.  -continue nebs and supplemental oxygen as needed.  Acute hypoxemic respiratory failure -Secondary to COPD with exacerbation and healthcare associated pneumonia, see above for details.  Chronic diastolic CHF -Appears volume compensated at present, echo in May 2015 shows ejection fraction of 55-60% with grade 1 diastolic dysfunction.   DVT prophylaxis: Lovenox Code Status: Full code Family Communication: Patient only Disposition Plan: Anticipate discharge home in 24-48 hours  Consultants:   None  Procedures:   None  Antimicrobials:   Vancomycin  Cefepime    Subjective: Feels much improved, less short of breath, afebrile  Objective: Vitals:   12/20/15 0815 12/20/15 1059 12/20/15 1405 12/20/15 1441  BP:  (!) 104/43 (!) 97/44   Pulse:  63 78    Resp:   20   Temp:   97.8 F (36.6 C)   TempSrc:   Oral   SpO2: 94%  100% 96%  Weight:      Height:        Intake/Output Summary (Last 24 hours) at 12/20/15 1612 Last data filed at 12/20/15 1600  Gross per 24 hour  Intake           710.53 ml  Output                0 ml  Net           710.53 ml   Filed Weights   12/19/15 1457 12/19/15 1933  Weight: 38.6 kg (85 lb) 38.6 kg (85 lb 1.6 oz)    Examination:  General exam: Alert, awake, oriented x 3 Respiratory system: Clear to auscultation. Respiratory effort normal. Cardiovascular system:RRR. No murmurs, rubs, gallops. Gastrointestinal system: Abdomen is nondistended, soft and nontender. No organomegaly or masses felt. Normal bowel sounds heard. Central nervous system: Alert and oriented. No focal neurological deficits. Extremities: No C/C/E, +pedal pulses Skin: No rashes, lesions or ulcers Psychiatry: Judgement and insight appear normal. Mood & affect appropriate.     Data Reviewed: I have personally reviewed following labs and imaging studies  CBC:  Recent Labs Lab 12/19/15 1523 12/20/15 0627  WBC 20.5* 9.7  NEUTROABS 16.8* 8.9*  HGB 8.0* 7.8*  HCT 27.3* 26.9*  MCV 96.5 97.8  PLT 223 [id number]   Basic Metabolic Panel:  Recent Labs Lab 12/19/15 1523 12/20/15 0627  NA 134* 136  K 3.0* 4.0  CL 84* 90*  CO2 42* 41*  GLUCOSE 106* 242*  BUN 11 12  CREATININE 0.81 0.75  CALCIUM 8.9 8.5*   GFR: Estimated Creatinine Clearance: 39.3 mL/min (by C-G formula based on SCr of 0.75 mg/dL). Liver Function Tests:  Recent Labs Lab 12/19/15 1523  AST 29  ALT 25  ALKPHOS 114  BILITOT 0.5  PROT 6.7  ALBUMIN 2.3*   No results for input(s): LIPASE, AMYLASE in the last 168 hours. No results for input(s): AMMONIA in the last 168 hours. Coagulation Profile: No results for input(s): INR, PROTIME in the last 168 hours. Cardiac Enzymes: No results for input(s): CKTOTAL, CKMB, CKMBINDEX, TROPONINI in the last 168  hours. BNP (last 3 results) No results for input(s): PROBNP in the last 8760 hours. HbA1C: No results for input(s): HGBA1C in the last 72 hours. CBG: No results for input(s): GLUCAP in the last 168 hours. Lipid Profile: No results for input(s): CHOL, HDL, LDLCALC, TRIG, CHOLHDL, LDLDIRECT in the last 72 hours. Thyroid Function Tests: No results for input(s): TSH, T4TOTAL, FREET4, T3FREE, THYROIDAB in the last 72 hours. Anemia Panel: No results for input(s): VITAMINB12, FOLATE, FERRITIN, TIBC, IRON, RETICCTPCT in the last 72 hours. Urine analysis:    Component Value Date/Time   COLORURINE YELLOW 12/19/2015 1506   APPEARANCEUR CLEAR 12/19/2015 1506   LABSPEC <1.005 (L) 12/19/2015 1506   PHURINE 5.5 12/19/2015 1506   GLUCOSEU NEGATIVE 12/19/2015 1506   HGBUR TRACE (A) 12/19/2015 1506   BILIRUBINUR NEGATIVE 12/19/2015 1506   KETONESUR NEGATIVE 12/19/2015 1506   PROTEINUR NEGATIVE 12/19/2015 1506   UROBILINOGEN 0.2 07/29/2013 1915   NITRITE NEGATIVE 12/19/2015 1506   LEUKOCYTESUR TRACE (A) 12/19/2015 1506   Sepsis Labs: @LABRCNTIP (procalcitonin:4,lacticidven:4)  ) Recent Results (from the past 240 hour(s))  Blood culture (routine x 2)     Status: None (Preliminary result)   Collection Time: 12/19/15  3:23 PM  Result Value Ref Range Status   Specimen Description BLOOD LEFT ANTECUBITAL DRAWN BY RN  Final   Special Requests BOTTLES DRAWN AEROBIC AND ANAEROBIC 5CC  Final   Culture NO GROWTH < 24 HOURS  Final   Report Status PENDING  Incomplete  Blood culture (routine x 2)     Status: None (Preliminary result)   Collection Time: 12/19/15  3:23 PM  Result Value Ref Range Status   Specimen Description BLOOD RIGHT ANTECUBITAL  Final   Special Requests BOTTLES DRAWN AEROBIC ONLY 2CC  Final   Culture NO GROWTH < 24 HOURS  Final   Report Status PENDING  Incomplete  MRSA PCR Screening     Status: Abnormal   Collection Time: 12/19/15  9:40 PM  Result Value Ref Range Status   MRSA by  PCR POSITIVE (A) NEGATIVE Final    Comment: RESULT CALLED TO, READ BACK BY AND VERIFIED WITH: MCGIBBONY,C AT 0145 ON 12/20/2015 BY ISLEY,B        The GeneXpert MRSA Assay (FDA approved for NASAL specimens only), is one component of a comprehensive MRSA colonization surveillance program. It is not intended to diagnose MRSA infection nor to guide or monitor treatment for MRSA infections.          Radiology Studies: Dg Chest 2 View  Result Date: 12/19/2015 CLINICAL DATA:  Recent episode of pneumonia. Patient reports feeling bad for the past several days. Cough and weakness. History of COPD, former smoker, atrial fibrillation, CHF, on home oxygen. EXAM: CHEST  2 VIEW COMPARISON:  PA and lateral chest x-ray of November 08, 2015 FINDINGS: There is extensive scarring which appears stable. A fibrocavitary lesion in the right apex is  stable. There are mildly increased lung markings at both bases as compared to the previous study. The heart is normal in size. The central pulmonary vascularity is prominent but stable. Retraction of the hilar structures superiorly is stable. There is calcification in the wall of the aortic arch. There is multilevel degenerative disc disease of the thoracic spine. IMPRESSION: Extensive chronic bronchitic changes with parenchymal scarring. Mildly increased density at the lung bases suggest superimposed pneumonia. No evidence of CHF. Followup PA and lateral chest X-ray is recommended in 3-4 weeks following trial of antibiotic therapy to ensure resolution and exclude underlying malignancy. Aortic atherosclerosis. Electronically Signed   By: David  Martinique M.D.   On: 12/19/2015 16:13        Scheduled Meds: . amitriptyline  50 mg Oral QHS  . aspirin  325 mg Oral QPM  . buPROPion  150 mg Oral QPM  . ceFEPime (MAXIPIME) IV  1 g Intravenous Q24H  . Chlorhexidine Gluconate Cloth  6 each Topical Q0600  . enoxaparin (LOVENOX) injection  30 mg Subcutaneous Q24H  . feeding  supplement (ENSURE ENLIVE)  237 mL Oral BID BM  . feeding supplement (PRO-STAT SUGAR FREE 64)  30 mL Oral BID  . guaiFENesin  600 mg Oral BID  . HYDROcodone-acetaminophen  1 tablet Oral BID  . ipratropium-albuterol  3 mL Nebulization Q6H WA  . loratadine  10 mg Oral Daily  . mouth rinse  15 mL Mouth Rinse BID  . metoprolol  50 mg Oral BID  . multivitamin with minerals  1 tablet Oral Daily  . mupirocin cream   Topical Daily  . mupirocin ointment  1 application Nasal BID  . pantoprazole  40 mg Oral Daily  . sodium chloride  1,000 mL Intravenous Once  . sodium chloride flush  3 mL Intravenous Q12H  . vancomycin  500 mg Intravenous Q24H   Continuous Infusions:    LOS: 1 day    Time spent: 35 minutes. Greater than 50% of this time was spent in direct contact with the patient coordinating care.     Lelon Frohlich, MD [facility] Pager [phone]  If 7PM-7AM, please contact night-coverage [email or web address] Password [email or web address] 12/20/2015, 4:12 PM

## 2015-12-20 NOTE — Progress Notes (Signed)
Pharmacy Antibiotic Note  [name] is a 70 y.o. female admitted on 12/19/2015 with pneumonia.  Pharmacy has been consulted for vancomycin  And cefepime dosing.  Plan: Cefepime 1gm IV q24h Cont Vanc 500 mg IV q24 hours F/u renal function, cultures and clinical course  Height: 4\' 10"  (147.3 cm) Weight: 85 lb 1.6 oz (38.6 kg) IBW/kg (Calculated) : 40.9  Temp (24hrs), Avg:98.8 F (37.1 C), Min:98.1 F (36.7 C), Max:99.6 F (37.6 C)   Recent Labs Lab 12/19/15 1523 12/19/15 1536 12/19/15 1831 12/20/15 0627  WBC 20.5*  --   --  9.7  CREATININE 0.81  --   --  0.75  LATICACIDVEN  --  1.67 1.58  --     Estimated Creatinine Clearance: 39.3 mL/min (by C-G formula based on SCr of 0.75 mg/dL).    Allergies  Allergen Reactions  . Celexa [Citalopram Hydrobromide]     Jerky movements  . Nickel   . Tramadol Hcl     REACTION: nausea and vomiting    Antimicrobials this admission: vanc 10/3 >>  Cefepime 10/3>>  Cultures: 10/3 Blood cx: ngtd 10/3 Urine cx: in process 10/3 MRSA PCR: positive  Thank you for allowing pharmacy to be a part of this patient's care. Isac Sarna, BS Pharm D, [place] Clinical Pharmacist Pager [phone] 12/20/2015 11:46 AM

## 2015-12-21 ENCOUNTER — Inpatient Hospital Stay (HOSPITAL_COMMUNITY): Payer: Medicare Other

## 2015-12-21 LAB — CBC WITH DIFFERENTIAL/PLATELET
BASOS PCT: 0 %
Basophils Absolute: 0 10*3/uL (ref 0.0–0.1)
Eosinophils Absolute: 0.1 10*3/uL (ref 0.0–0.7)
Eosinophils Relative: 1 %
HEMATOCRIT: 29 % — AB (ref 36.0–46.0)
HEMOGLOBIN: 8.1 g/dL — AB (ref 12.0–15.0)
LYMPHS PCT: 17 %
Lymphs Abs: 2.2 10*3/uL (ref 0.7–4.0)
MCH: 28.5 pg (ref 26.0–34.0)
MCHC: 27.9 g/dL — AB (ref 30.0–36.0)
MCV: 102.1 fL — ABNORMAL HIGH (ref 78.0–100.0)
MONOS PCT: 10 %
Monocytes Absolute: 1.3 10*3/uL — ABNORMAL HIGH (ref 0.1–1.0)
NEUTROS ABS: 9.2 10*3/uL — AB (ref 1.7–7.7)
Neutrophils Relative %: 72 %
Platelets: 193 10*3/uL (ref 150–400)
RBC: 2.84 MIL/uL — ABNORMAL LOW (ref 3.87–5.11)
RDW: 13.3 % (ref 11.5–15.5)
WBC Morphology: INCREASED
WBC: 12.8 10*3/uL — ABNORMAL HIGH (ref 4.0–10.5)

## 2015-12-21 LAB — URINE CULTURE

## 2015-12-21 LAB — BASIC METABOLIC PANEL
Anion gap: 6 (ref 5–15)
BUN: 22 mg/dL — AB (ref 6–20)
CHLORIDE: 92 mmol/L — AB (ref 101–111)
CO2: 43 mmol/L — AB (ref 22–32)
CREATININE: 0.76 mg/dL (ref 0.44–1.00)
Calcium: 9.4 mg/dL (ref 8.9–10.3)
GFR calc Af Amer: >60 mL/min (ref >60)
GFR calc non Af Amer: >60 mL/min (ref >60)
Glucose, Bld: 101 mg/dL — ABNORMAL HIGH (ref 65–99)
Potassium: 4 mmol/L (ref 3.5–5.1)
Sodium: 141 mmol/L (ref 135–145)

## 2015-12-21 MED ORDER — FUROSEMIDE 10 MG/ML IJ SOLN
40.0000 mg | Freq: Once | INTRAMUSCULAR | Status: AC
  Administered 2015-12-21: 40 mg via INTRAVENOUS
  Filled 2015-12-21: qty 4

## 2015-12-21 NOTE — Progress Notes (Signed)
Patient went to bathroom with assistance of staff. Upon vital sign check, patient's oxygen saturation was 60-low70s on 2l of nasal cannula.  Turned patient up to 6L of oxygen and patient oxygen saturation increased to 91%.  Notified MD of increased need of oxygen supplementation.  Received orders from MD.  Patient was alert and responsive and at baseline mentation during this time.  Patient has voided several times during the night.  Will carry out MD orders and reassess in one hour.

## 2015-12-21 NOTE — Consult Note (Signed)
   [facility] Inpatient Consult   12/21/2015  [name] June 05, 1945 [id number]    Spoke with patient at bedside regarding [facility] services. Patient does not want to sign up today but will keep brochure and discuss with spouse. Patient given [facility] brochure and contact information for future reference, voices appreciation of information.    Inpatient case manager aware that patient offered [facility] case management services but declined.   Of note, [facility] Care Management services would not replace or interfere with any services that are arranged by inpatient case management or social work.  For additional questions or referrals please contact:   Royetta Crochet. Laymond Purser, RN, BSN, [facility] Liaison [phone]

## 2015-12-21 NOTE — Progress Notes (Signed)
Reassessed patient after lasix and application of venti mask.  Patient was not keeping mask on effectively and nasal cannula was reapplied. Patient was saturing 98% on 6 liters, and was breathing slightly easier than earlier.  Patient had 200 output of urine from onetime dose of lasix. Will continue to monitor patient.

## 2015-12-21 NOTE — Progress Notes (Signed)
PROGRESS NOTE    [name]  [id number] DOB: Aug 01, 1945 DOA: 12/19/2015 PCP: Chevis Pretty, FNP     Brief Narrative:  70 year old woman admitted on 10/3 from home with complaints of cough and shortness of breath. She was found to have pneumonia that is presumed to be hospital-acquired given her recent hospitalization at the end of August. Overnight 10/4 was noted to be more confused and hypoxemic with increased oxygen requirements.   Assessment & Plan:   Principal Problem:   HCAP (healthcare-associated pneumonia) Active Problems:   COPD exacerbation (HCC)   HTN (hypertension)   RA (rheumatoid arthritis) ([place])   Chronic diastolic CHF (congestive heart failure) ([place])   Hypercarbia   Hypokalemia   Healthcare associated pneumonia -I agree with continued broad-spectrum antibiotic therapy for now. MRSA PCR is positive, will continue vancomycin for now. -Blood cultures remain negative at day 2. -Clinical worsening overnight with hypoxemia, and increased confusion. Order PCXR.  COPD with acute exacerbation -Seems improved, not currently receiving steroids, do not believe she needs steroids as she has good air movement and no wheezing on exam. -continue nebs and supplemental oxygen as needed.  Acute hypoxemic respiratory failure -Secondary to COPD with exacerbation and healthcare associated pneumonia, see above for details.  Chronic diastolic CHF -Appears volume compensated at present, echo in May 2015 shows ejection fraction of 55-60% with grade 1 diastolic dysfunction.   DVT prophylaxis: Lovenox Code Status: Full code Family Communication: Patient only Disposition Plan: To be determined pending clinical improvement.  Consultants:   None  Procedures:   None  Antimicrobials:   Vancomycin  Cefepime    Subjective: Overnight with increased confusion and increased oxygen requirements. Is somnolent this am.  Objective: Vitals:   12/21/15 0326  12/21/15 0449 12/21/15 0600 12/21/15 1312  BP:  (!) 110/54  105/70  Pulse:  89  91  Resp:  (!) 22  20  Temp:  97.5 F (36.4 C)  98.5 F (36.9 C)  TempSrc:  Oral  Oral  SpO2: 90% 91% 98% 100%  Weight:      Height:        Intake/Output Summary (Last 24 hours) at 12/21/15 1411 Last data filed at 12/21/15 1300  Gross per 24 hour  Intake              490 ml  Output             2900 ml  Net            -2410 ml   Filed Weights   12/19/15 1457 12/19/15 1933  Weight: 38.6 kg (85 lb) 38.6 kg (85 lb 1.6 oz)    Examination:  General exam: Somnolent Respiratory system: Clear to auscultation. Respiratory effort normal. Cardiovascular system:RRR. No murmurs, rubs, gallops. Gastrointestinal system: Abdomen is nondistended, soft and nontender. No organomegaly or masses felt. Normal bowel sounds heard. Central nervous system: Alert and oriented. No focal neurological deficits. Extremities: No C/C/E, +pedal pulses Skin: No rashes, lesions or ulcers Psychiatry: Unable to assess given current mental state    Data Reviewed: I have personally reviewed following labs and imaging studies  CBC:  Recent Labs Lab 12/19/15 1523 12/20/15 0627 12/21/15 0459  WBC 20.5* 9.7 12.8*  NEUTROABS 16.8* 8.9* 9.2*  HGB 8.0* 7.8* 8.1*  HCT 27.3* 26.9* 29.0*  MCV 96.5 97.8 102.1*  PLT 223 347 0000000   Basic Metabolic Panel:  Recent Labs Lab 12/19/15 1523 12/20/15 0627 12/21/15 0459  NA 134* 136 141  K 3.0*  4.0 4.0  CL 84* 90* 92*  CO2 42* 41* 43*  GLUCOSE 106* 242* 101*  BUN 11 12 22*  CREATININE 0.81 0.75 0.76  CALCIUM 8.9 8.5* 9.4   GFR: Estimated Creatinine Clearance: 39.3 mL/min (by C-G formula based on SCr of 0.76 mg/dL). Liver Function Tests:  Recent Labs Lab 12/19/15 1523  AST 29  ALT 25  ALKPHOS 114  BILITOT 0.5  PROT 6.7  ALBUMIN 2.3*   No results for input(s): LIPASE, AMYLASE in the last 168 hours. No results for input(s): AMMONIA in the last 168 hours. Coagulation  Profile: No results for input(s): INR, PROTIME in the last 168 hours. Cardiac Enzymes: No results for input(s): CKTOTAL, CKMB, CKMBINDEX, TROPONINI in the last 168 hours. BNP (last 3 results) No results for input(s): PROBNP in the last 8760 hours. HbA1C: No results for input(s): HGBA1C in the last 72 hours. CBG: No results for input(s): GLUCAP in the last 168 hours. Lipid Profile: No results for input(s): CHOL, HDL, LDLCALC, TRIG, CHOLHDL, LDLDIRECT in the last 72 hours. Thyroid Function Tests: No results for input(s): TSH, T4TOTAL, FREET4, T3FREE, THYROIDAB in the last 72 hours. Anemia Panel: No results for input(s): VITAMINB12, FOLATE, FERRITIN, TIBC, IRON, RETICCTPCT in the last 72 hours. Urine analysis:    Component Value Date/Time   COLORURINE YELLOW 12/19/2015 1506   APPEARANCEUR CLEAR 12/19/2015 1506   LABSPEC <1.005 (L) 12/19/2015 1506   PHURINE 5.5 12/19/2015 1506   GLUCOSEU NEGATIVE 12/19/2015 1506   HGBUR TRACE (A) 12/19/2015 1506   BILIRUBINUR NEGATIVE 12/19/2015 1506   KETONESUR NEGATIVE 12/19/2015 1506   PROTEINUR NEGATIVE 12/19/2015 1506   UROBILINOGEN 0.2 07/29/2013 1915   NITRITE NEGATIVE 12/19/2015 1506   LEUKOCYTESUR TRACE (A) 12/19/2015 1506   Sepsis Labs: @LABRCNTIP (procalcitonin:4,lacticidven:4)  ) Recent Results (from the past 240 hour(s))  Urine culture     Status: Abnormal   Collection Time: 12/19/15  3:06 PM  Result Value Ref Range Status   Specimen Description URINE, CLEAN CATCH  Final   Special Requests NONE  Final   Culture MULTIPLE SPECIES PRESENT, SUGGEST RECOLLECTION (A)  Final   Report Status 12/21/2015 FINAL  Final  Blood culture (routine x 2)     Status: None (Preliminary result)   Collection Time: 12/19/15  3:23 PM  Result Value Ref Range Status   Specimen Description BLOOD LEFT ANTECUBITAL DRAWN BY RN  Final   Special Requests BOTTLES DRAWN AEROBIC AND ANAEROBIC 5CC  Final   Culture NO GROWTH 2 DAYS  Final   Report Status PENDING   Incomplete  Blood culture (routine x 2)     Status: None (Preliminary result)   Collection Time: 12/19/15  3:23 PM  Result Value Ref Range Status   Specimen Description BLOOD RIGHT ANTECUBITAL  Final   Special Requests BOTTLES DRAWN AEROBIC ONLY 2CC  Final   Culture NO GROWTH 2 DAYS  Final   Report Status PENDING  Incomplete  MRSA PCR Screening     Status: Abnormal   Collection Time: 12/19/15  9:40 PM  Result Value Ref Range Status   MRSA by PCR POSITIVE (A) NEGATIVE Final    Comment: RESULT CALLED TO, READ BACK BY AND VERIFIED WITH: MCGIBBONY,C AT 0145 ON 12/20/2015 BY ISLEY,B        The GeneXpert MRSA Assay (FDA approved for NASAL specimens only), is one component of a comprehensive MRSA colonization surveillance program. It is not intended to diagnose MRSA infection nor to guide or monitor treatment for MRSA infections.  Radiology Studies: Dg Chest 2 View  Result Date: 12/19/2015 CLINICAL DATA:  Recent episode of pneumonia. Patient reports feeling bad for the past several days. Cough and weakness. History of COPD, former smoker, atrial fibrillation, CHF, on home oxygen. EXAM: CHEST  2 VIEW COMPARISON:  PA and lateral chest x-ray of November 08, 2015 FINDINGS: There is extensive scarring which appears stable. A fibrocavitary lesion in the right apex is stable. There are mildly increased lung markings at both bases as compared to the previous study. The heart is normal in size. The central pulmonary vascularity is prominent but stable. Retraction of the hilar structures superiorly is stable. There is calcification in the wall of the aortic arch. There is multilevel degenerative disc disease of the thoracic spine. IMPRESSION: Extensive chronic bronchitic changes with parenchymal scarring. Mildly increased density at the lung bases suggest superimposed pneumonia. No evidence of CHF. Followup PA and lateral chest X-ray is recommended in 3-4 weeks following trial of antibiotic  therapy to ensure resolution and exclude underlying malignancy. Aortic atherosclerosis. Electronically Signed   By: David  Martinique M.D.   On: 12/19/2015 16:13        Scheduled Meds: . amitriptyline  50 mg Oral QHS  . aspirin  325 mg Oral QPM  . buPROPion  150 mg Oral QPM  . ceFEPime (MAXIPIME) IV  1 g Intravenous Q24H  . Chlorhexidine Gluconate Cloth  6 each Topical Q0600  . enoxaparin (LOVENOX) injection  30 mg Subcutaneous Q24H  . feeding supplement (ENSURE ENLIVE)  237 mL Oral BID BM  . feeding supplement (PRO-STAT SUGAR FREE 64)  30 mL Oral BID  . furosemide  20 mg Oral Daily  . guaiFENesin  600 mg Oral BID  . HYDROcodone-acetaminophen  1 tablet Oral BID  . ipratropium-albuterol  3 mL Nebulization Q6H WA  . loratadine  10 mg Oral Daily  . mouth rinse  15 mL Mouth Rinse BID  . metoprolol  50 mg Oral BID  . multivitamin with minerals  1 tablet Oral Daily  . mupirocin cream   Topical Daily  . mupirocin ointment  1 application Nasal BID  . pantoprazole  40 mg Oral Daily  . sodium chloride  1,000 mL Intravenous Once  . sodium chloride flush  3 mL Intravenous Q12H  . vancomycin  500 mg Intravenous Q24H   Continuous Infusions:    LOS: 2 days    Time spent: 25 minutes. Greater than 50% of this time was spent in direct contact with the patient coordinating care.     Lelon Frohlich, MD [facility] Pager [phone]  If 7PM-7AM, please contact night-coverage [email or web address] Password [email or web address] 12/21/2015, 2:11 PM

## 2015-12-22 LAB — CBC WITH DIFFERENTIAL/PLATELET
Basophils Absolute: 0 10*3/uL (ref 0.0–0.1)
Basophils Relative: 0 %
EOS ABS: 0.1 10*3/uL (ref 0.0–0.7)
Eosinophils Relative: 1 %
HEMATOCRIT: 28.1 % — AB (ref 36.0–46.0)
HEMOGLOBIN: 8 g/dL — AB (ref 12.0–15.0)
LYMPHS ABS: 1.3 10*3/uL (ref 0.7–4.0)
LYMPHS PCT: 8 %
MCH: 28.5 pg (ref 26.0–34.0)
MCHC: 28.5 g/dL — ABNORMAL LOW (ref 30.0–36.0)
MCV: 100 fL (ref 78.0–100.0)
Monocytes Absolute: 1.1 10*3/uL — ABNORMAL HIGH (ref 0.1–1.0)
Monocytes Relative: 7 %
NEUTROS ABS: 13.5 10*3/uL — AB (ref 1.7–7.7)
NEUTROS PCT: 84 %
Platelets: 306 10*3/uL (ref 150–400)
RBC: 2.81 MIL/uL — AB (ref 3.87–5.11)
RDW: 14 % (ref 11.5–15.5)
WBC: 16 10*3/uL — AB (ref 4.0–10.5)

## 2015-12-22 LAB — BASIC METABOLIC PANEL
Anion gap: 8 (ref 5–15)
BUN: 23 mg/dL — AB (ref 6–20)
CHLORIDE: 84 mmol/L — AB (ref 101–111)
CO2: 45 mmol/L — AB (ref 22–32)
Calcium: 9.2 mg/dL (ref 8.9–10.3)
Creatinine, Ser: 0.73 mg/dL (ref 0.44–1.00)
GFR calc Af Amer: >60 mL/min (ref >60)
GFR calc non Af Amer: >60 mL/min (ref >60)
Glucose, Bld: 114 mg/dL — ABNORMAL HIGH (ref 65–99)
POTASSIUM: 4.3 mmol/L (ref 3.5–5.1)
SODIUM: 137 mmol/L (ref 135–145)

## 2015-12-22 LAB — VANCOMYCIN, TROUGH: VANCOMYCIN TR: 11 ug/mL — AB (ref 15–20)

## 2015-12-22 MED ORDER — VANCOMYCIN HCL IN DEXTROSE 750-5 MG/150ML-% IV SOLN
750.0000 mg | INTRAVENOUS | Status: DC
  Administered 2015-12-22 – 2015-12-24 (×3): 750 mg via INTRAVENOUS
  Filled 2015-12-22 (×4): qty 150

## 2015-12-22 NOTE — Progress Notes (Signed)
Called nurse for leaking IV, had time to change bed nurse finally arrived. Patient is confused.

## 2015-12-22 NOTE — Progress Notes (Signed)
PROGRESS NOTE    [name]  [id number] DOB: 1945/04/18 DOA: 12/19/2015 PCP: Chevis Pretty, FNP     Brief Narrative:  70 year old woman admitted on 10/3 from home with complaints of cough and shortness of breath. She was found to have pneumonia that is presumed to be hospital-acquired given her recent hospitalization at the end of August. Overnight 10/4 was noted to be more confused and hypoxemic with increased oxygen requirements.   Assessment & Plan:   Principal Problem:   HCAP (healthcare-associated pneumonia) Active Problems:   COPD exacerbation (HCC)   HTN (hypertension)   RA (rheumatoid arthritis) ([place])   Chronic diastolic CHF (congestive heart failure) ([place])   Hypercarbia   Hypokalemia   Healthcare associated pneumonia -I agree with continued broad-spectrum antibiotic therapy for now. MRSA PCR is positive, will continue vancomycin for now. -Blood cultures remain negative at day 3. -Will need oxygen on DC. -PNA appears worse on repeat CXR, plan to continue broad spectrum abx for now.   COPD with acute exacerbation -Seems improved, not currently receiving steroids, do not believe she needs steroids as she has good air movement and no wheezing on exam. -continue nebs and supplemental oxygen as needed.  Acute hypoxemic respiratory failure -Secondary to COPD with exacerbation and healthcare associated pneumonia, see above for details.  Chronic diastolic CHF -Appears volume compensated at present, echo in May 2015 shows ejection fraction of 55-60% with grade 1 diastolic dysfunction.   DVT prophylaxis: Lovenox Code Status: Full code Family Communication: Patient only Disposition Plan: To be determined pending clinical improvement. Suspect will need SNF if does not improve. Request PT evaluation.  Consultants:   None  Procedures:   None  Antimicrobials:   Vancomycin  Cefepime    Subjective:  Is somnolent this am.  Objective: Vitals:     12/22/15 0153 12/22/15 0400 12/22/15 1315 12/22/15 1406  BP:  (!) 96/50 (!) 117/56   Pulse:  (!) 114 70   Resp:   20   Temp:  98 F (36.7 C) 98.7 F (37.1 C)   TempSrc:  Oral Oral   SpO2: 97% 98% 99% 97%  Weight:      Height:        Intake/Output Summary (Last 24 hours) at 12/22/15 1452 Last data filed at 12/22/15 1327  Gross per 24 hour  Intake              730 ml  Output             1100 ml  Net             -370 ml   Filed Weights   12/19/15 1457 12/19/15 1933  Weight: 38.6 kg (85 lb) 38.6 kg (85 lb 1.6 oz)    Examination:  General exam: Somnolent Respiratory system: Clear to auscultation. Respiratory effort normal. Cardiovascular system:RRR. No murmurs, rubs, gallops. Gastrointestinal system: Abdomen is nondistended, soft and nontender. No organomegaly or masses felt. Normal bowel sounds heard. Central nervous system: Alert and oriented. No focal neurological deficits. Extremities: No C/C/E, +pedal pulses Skin: No rashes, lesions or ulcers Psychiatry: Unable to assess given current mental state    Data Reviewed: I have personally reviewed following labs and imaging studies  CBC:  Recent Labs Lab 12/19/15 1523 12/20/15 0627 12/21/15 0459 12/22/15 0606  WBC 20.5* 9.7 12.8* 16.0*  NEUTROABS 16.8* 8.9* 9.2* 13.5*  HGB 8.0* 7.8* 8.1* 8.0*  HCT 27.3* 26.9* 29.0* 28.1*  MCV 96.5 97.8 102.1* 100.0  PLT 223 347  193 [id number]   Basic Metabolic Panel:  Recent Labs Lab 12/19/15 1523 12/20/15 0627 12/21/15 0459 12/22/15 0606  NA 134* 136 141 137  K 3.0* 4.0 4.0 4.3  CL 84* 90* 92* 84*  CO2 42* 41* 43* 45*  GLUCOSE 106* 242* 101* 114*  BUN 11 12 22* 23*  CREATININE 0.81 0.75 0.76 0.73  CALCIUM 8.9 8.5* 9.4 9.2   GFR: Estimated Creatinine Clearance: 39.3 mL/min (by C-G formula based on SCr of 0.73 mg/dL). Liver Function Tests:  Recent Labs Lab 12/19/15 1523  AST 29  ALT 25  ALKPHOS 114  BILITOT 0.5  PROT 6.7  ALBUMIN 2.3*   No results for  input(s): LIPASE, AMYLASE in the last 168 hours. No results for input(s): AMMONIA in the last 168 hours. Coagulation Profile: No results for input(s): INR, PROTIME in the last 168 hours. Cardiac Enzymes: No results for input(s): CKTOTAL, CKMB, CKMBINDEX, TROPONINI in the last 168 hours. BNP (last 3 results) No results for input(s): PROBNP in the last 8760 hours. HbA1C: No results for input(s): HGBA1C in the last 72 hours. CBG: No results for input(s): GLUCAP in the last 168 hours. Lipid Profile: No results for input(s): CHOL, HDL, LDLCALC, TRIG, CHOLHDL, LDLDIRECT in the last 72 hours. Thyroid Function Tests: No results for input(s): TSH, T4TOTAL, FREET4, T3FREE, THYROIDAB in the last 72 hours. Anemia Panel: No results for input(s): VITAMINB12, FOLATE, FERRITIN, TIBC, IRON, RETICCTPCT in the last 72 hours. Urine analysis:    Component Value Date/Time   COLORURINE YELLOW 12/19/2015 1506   APPEARANCEUR CLEAR 12/19/2015 1506   LABSPEC <1.005 (L) 12/19/2015 1506   PHURINE 5.5 12/19/2015 1506   GLUCOSEU NEGATIVE 12/19/2015 1506   HGBUR TRACE (A) 12/19/2015 1506   BILIRUBINUR NEGATIVE 12/19/2015 1506   KETONESUR NEGATIVE 12/19/2015 1506   PROTEINUR NEGATIVE 12/19/2015 1506   UROBILINOGEN 0.2 07/29/2013 1915   NITRITE NEGATIVE 12/19/2015 1506   LEUKOCYTESUR TRACE (A) 12/19/2015 1506   Sepsis Labs: @LABRCNTIP (procalcitonin:4,lacticidven:4)  ) Recent Results (from the past 240 hour(s))  Urine culture     Status: Abnormal   Collection Time: 12/19/15  3:06 PM  Result Value Ref Range Status   Specimen Description URINE, CLEAN CATCH  Final   Special Requests NONE  Final   Culture MULTIPLE SPECIES PRESENT, SUGGEST RECOLLECTION (A)  Final   Report Status 12/21/2015 FINAL  Final  Blood culture (routine x 2)     Status: None (Preliminary result)   Collection Time: 12/19/15  3:23 PM  Result Value Ref Range Status   Specimen Description BLOOD LEFT ANTECUBITAL DRAWN BY RN  Final    Special Requests BOTTLES DRAWN AEROBIC AND ANAEROBIC 5CC  Final   Culture NO GROWTH 3 DAYS  Final   Report Status PENDING  Incomplete  Blood culture (routine x 2)     Status: None (Preliminary result)   Collection Time: 12/19/15  3:23 PM  Result Value Ref Range Status   Specimen Description BLOOD RIGHT ANTECUBITAL  Final   Special Requests BOTTLES DRAWN AEROBIC ONLY 2CC  Final   Culture NO GROWTH 3 DAYS  Final   Report Status PENDING  Incomplete  MRSA PCR Screening     Status: Abnormal   Collection Time: 12/19/15  9:40 PM  Result Value Ref Range Status   MRSA by PCR POSITIVE (A) NEGATIVE Final    Comment: RESULT CALLED TO, READ BACK BY AND VERIFIED WITH: MCGIBBONY,C AT 0145 ON 12/20/2015 BY ISLEY,B        The GeneXpert  MRSA Assay (FDA approved for NASAL specimens only), is one component of a comprehensive MRSA colonization surveillance program. It is not intended to diagnose MRSA infection nor to guide or monitor treatment for MRSA infections.          Radiology Studies: Dg Chest Port 1 View  Result Date: 12/21/2015 CLINICAL DATA:  Hypoxia. EXAM: PORTABLE CHEST 1 VIEW COMPARISON:  12/19/2015. FINDINGS: Normal sized heart. Biapical cavitation and scarring with volume loss without significant change. Mildly increased patchy opacity at both lung bases with small bilateral pleural effusions currently demonstrated. Aortic arch calcifications. Calcifications in both distal rotator cuffs. IMPRESSION: 1. Mildly increased bibasilar pneumonia or alveolar edema. 2. Interval small bilateral pleural effusions. 3. Stable biapical cavitation and scarring. This can be seen with active or previous tuberculosis or fungal infection. Electronically Signed   By: Claudie Revering M.D.   On: 12/21/2015 15:52        Scheduled Meds: . amitriptyline  50 mg Oral QHS  . aspirin  325 mg Oral QPM  . buPROPion  150 mg Oral QPM  . ceFEPime (MAXIPIME) IV  1 g Intravenous Q24H  . Chlorhexidine Gluconate  Cloth  6 each Topical Q0600  . enoxaparin (LOVENOX) injection  30 mg Subcutaneous Q24H  . feeding supplement (ENSURE ENLIVE)  237 mL Oral BID BM  . feeding supplement (PRO-STAT SUGAR FREE 64)  30 mL Oral BID  . furosemide  20 mg Oral Daily  . guaiFENesin  600 mg Oral BID  . HYDROcodone-acetaminophen  1 tablet Oral BID  . ipratropium-albuterol  3 mL Nebulization Q6H WA  . loratadine  10 mg Oral Daily  . mouth rinse  15 mL Mouth Rinse BID  . metoprolol  50 mg Oral BID  . multivitamin with minerals  1 tablet Oral Daily  . mupirocin cream   Topical Daily  . mupirocin ointment  1 application Nasal BID  . pantoprazole  40 mg Oral Daily  . sodium chloride  1,000 mL Intravenous Once  . sodium chloride flush  3 mL Intravenous Q12H  . vancomycin  500 mg Intravenous Q24H   Continuous Infusions:    LOS: 3 days    Time spent: 25 minutes. Greater than 50% of this time was spent in direct contact with the patient coordinating care.     Lelon Frohlich, MD [facility] Pager [phone]  If 7PM-7AM, please contact night-coverage [email or web address] Password [email or web address] 12/22/2015, 2:52 PM

## 2015-12-22 NOTE — Evaluation (Signed)
Physical Therapy Evaluation Patient Details Name: [name] MRN: [id number] DOB: 1945/07/13 Today's Date: 12/22/2015   History of Present Illness  [name] is a 70yo white female who comes to [facility] on 10/3 after worsening SOB at home. Pt was admitted back in July-August with PNA + COPD exacerbation, she reports that her symptoms gradually got worse at home. At baseline she is on 3L/min O2 at home. Per medical reocrd, previously not using any AD for mobility.  PMH: afib, AVN s/p THA, HTN, RA, depression , ETOH abuse, COPD,   Clinical Impression  Upon entry, the patient is received semirecumbent in bed, no family/caregiver present. The pt is awake and agreeable to participate. No acute distress noted at this time. The pt is alert, mildly drowsy and oriented to person only. Pt following simple commands inconsistently, requiring frequent redirection. Cognitive screening reveals moderate cognitive impairment in short term memory, orientation, attention, and executive function, warranting formal assessment from SLP; unclear if this is baseline, but will certainly create limitations to self care and independence in the future. Pt caught trying to don an already donner Nasal [place] despite assurance that she was already wearing one. Pt received on and remaining on 3.5L/min O2, sufficicent throughout majority of evaluation, but with noted desaturation to <85% after 2-3' continuous AMB: O2 increased to 5L/min to aid recovery. Functional mobility assessment demonstrates mild weakness during transfers and bed mobility requiring additional effort. The patient is at high risk for falls as evidence by gait speed <1.27m/s, forward reach <5", and multiple LOB demonstrated throughout session, as well as unsafe practices with RW.   Patient presenting with impairment of strength, balance, cognition, and activity tolerance, limiting ability to perform ADL and mobility tasks at  baseline level of function. Patient  will benefit from skilled intervention to address the above impairments and limitations, in order to restore to prior level of function, improve patient safety upon discharge, and to decrease falls risk.       Follow Up Recommendations Supervision/Assistance - 24 hour;Home health PT;Supervision for mobility/OOB (Pt with moderate cognitive impairment, unclear if this is baseline or not.)    Equipment Recommendations  None recommended by PT    Recommendations for Other Services       Precautions / Restrictions Precautions Precautions: Fall Restrictions Weight Bearing Restrictions: No      Mobility  Bed Mobility Overal bed mobility: Needs Assistance Bed Mobility: Supine to Sit;Sit to Supine     Supine to sit: Supervision Sit to supine: Supervision   General bed mobility comments: disjointed motor planning, distracted, requires VC to attend to task.   Transfers Overall transfer level: Needs assistance Equipment used: Rolling walker (2 wheeled) Transfers: Sit to/from Stand Sit to Stand: Min assist         General transfer comment: Immediate falls anxiety upon standing. Multiple LOB with tow transfers. Falls onto bed into seated positon after uncontroled retropulsion.   Ambulation/Gait Ambulation/Gait assistance: Min guard Ambulation Distance (Feet): 20 Feet Assistive device: Rolling walker (2 wheeled)     Gait velocity interpretation: <1.8 ft/sec, indicative of risk for recurrent falls General Gait Details: generally poor awareness of body in space; alteranting AMB and retroAMB well with cues, LOB repeatedly with retroAMB and unsafe use of RW. Total AMB time is ~4 minutes with SaO2 stable for 2 minutes then dropping to 84% suddenly wiith poor recovery.    Stairs            Wheelchair Mobility    Modified  Rankin (Stroke Patients Only)       Balance Overall balance assessment: History of Falls;Needs assistance         Standing balance support: Bilateral  upper extremity supported;During functional activity Standing balance-Leahy Scale: Poor                               Pertinent Vitals/Pain Pain Assessment: No/denies pain    Home Living Family/patient expects to be discharged to:: Private residence Living Arrangements: Spouse/significant other Available Help at Discharge:  (family nearby assists sometimes) Type of Home: Mobile home Home Access: Stairs to enter;Ramped entrance   Entrance Stairs-Number of Steps: 2 in the front and 2 in the back.  Home Layout: One level Home Equipment: Cane - single point;Walker - 4 wheels;Bedside commode;Shower seat      Prior Function           Comments: Pt with AMS and decreased cognition; unable to answer.      Hand Dominance        Extremity/Trunk Assessment   Upper Extremity Assessment: Generalized weakness           Lower Extremity Assessment: Generalized weakness;Overall WFL for tasks assessed         Communication   Communication: Expressive difficulties  Cognition Arousal/Alertness: Lethargic Behavior During Therapy: WFL for tasks assessed/performed Overall Cognitive Status: No family/caregiver present to determine baseline cognitive functioning Area of Impairment: Memory;Orientation;Problem solving Orientation Level: Disoriented to;Place;Time;Situation (Believes to be at home and repeatedly believe the year to be 1920. )   Memory: Decreased short-term memory (recalls 1 of 3 items after 2 minutes. )              General Comments      Exercises     Assessment/Plan    PT Assessment Patient needs continued PT services  PT Problem List Decreased strength;Decreased activity tolerance;Decreased balance;Decreased mobility;Decreased coordination;Decreased cognition;Decreased knowledge of use of DME;Decreased safety awareness;Decreased knowledge of precautions          PT Treatment Interventions DME instruction;Gait training;Functional mobility  training;Therapeutic activities;Therapeutic exercise;Balance training    PT Goals (Current goals can be found in the Care Plan section)  Acute Rehab PT Goals PT Goal Formulation: Patient unable to participate in goal setting    Frequency Min 3X/week   Barriers to discharge Inaccessible home environment;Decreased caregiver support      Co-evaluation               End of Session   Activity Tolerance: Treatment limited secondary to medical complications (Comment);Patient limited by lethargy Patient left: in bed;with call bell/phone within reach;with bed alarm set Nurse Communication: Mobility status;Other (comment)         [id number] PT Time Calculation (min) (ACUTE ONLY): 29 min   Charges:   PT Evaluation $PT Eval High Complexity: 1 Procedure PT Treatments $Therapeutic Activity: 8-22 mins   PT G Codes:       4:07 PM, Jan 14, 2016 Etta Grandchild, PT, DPT Physical Therapist at [place] [phone] (office)

## 2015-12-22 NOTE — Care Management Important Message (Signed)
Important Message  Patient Details  Name: [name] MRN: [id number] Date of Birth: Jun 12, 1945   Medicare Important Message Given:  Yes    Bradon Fester, Chauncey Reading, RN 12/22/2015, 11:55 AM

## 2015-12-22 NOTE — Progress Notes (Addendum)
Patient in room, asking for help.  Went in to assess patient, patient coughing, dry cough.  Vital signs done and patient oxygen saturation had decreased down to 70%, although patient had been weaned down to her chronic 2 liters during the night.  Patient oxygen increased up to 6l and oxygen saturation returned to the 90s.. Patient had similar episode yesterday am.  Patient now resting comfortably in bed on 6l oxygen.  Will continue to monitor patient. MD made aware. No new orders received.

## 2015-12-23 LAB — BASIC METABOLIC PANEL
Anion gap: 8 (ref 5–15)
BUN: 21 mg/dL — AB (ref 6–20)
CO2: 47 mmol/L — ABNORMAL HIGH (ref 22–32)
CREATININE: 0.72 mg/dL (ref 0.44–1.00)
Calcium: 9 mg/dL (ref 8.9–10.3)
Chloride: 86 mmol/L — ABNORMAL LOW (ref 101–111)
GFR calc Af Amer: >60 mL/min (ref >60)
Glucose, Bld: 93 mg/dL (ref 65–99)
Potassium: 3.3 mmol/L — ABNORMAL LOW (ref 3.5–5.1)
SODIUM: 141 mmol/L (ref 135–145)

## 2015-12-23 LAB — CBC
HCT: 26.1 % — ABNORMAL LOW (ref 36.0–46.0)
Hemoglobin: 7.5 g/dL — ABNORMAL LOW (ref 12.0–15.0)
MCH: 28.6 pg (ref 26.0–34.0)
MCHC: 28.7 g/dL — ABNORMAL LOW (ref 30.0–36.0)
MCV: 99.6 fL (ref 78.0–100.0)
PLATELETS: 349 10*3/uL (ref 150–400)
RBC: 2.62 MIL/uL — ABNORMAL LOW (ref 3.87–5.11)
RDW: 14.1 % (ref 11.5–15.5)
WBC: 9.4 10*3/uL (ref 4.0–10.5)

## 2015-12-23 MED ORDER — ACETAMINOPHEN 325 MG PO TABS
650.0000 mg | ORAL_TABLET | Freq: Four times a day (QID) | ORAL | Status: DC | PRN
  Administered 2015-12-23: 650 mg via ORAL
  Filled 2015-12-23: qty 2

## 2015-12-23 NOTE — Progress Notes (Signed)
Pharmacy Antibiotic Note  [name] is a 70 y.o. female admitted on 12/19/2015 with pneumonia.  Pharmacy has been consulted for vancomycin  And cefepime dosing. Vancomycin trough below goal  Plan: Continue Cefepime 1gm IV q24h Change Vancomycin to 750 mg  IV q24 hours F/u renal function, cultures and clinical course  Height: 4\' 10"  (147.3 cm) Weight: 85 lb 1.6 oz (38.6 kg) IBW/kg (Calculated) : 40.9  Temp (24hrs), Avg:98.3 F (36.8 C), Min:97.9 F (36.6 C), Max:98.7 F (37.1 C)   Recent Labs Lab 12/19/15 1523 12/19/15 1536 12/19/15 1831 12/20/15 0627 12/21/15 0459 12/22/15 0606 12/22/15 1632 12/23/15 0612  WBC 20.5*  --   --  9.7 12.8* 16.0*  --  9.4  CREATININE 0.81  --   --  0.75 0.76 0.73  --  0.72  LATICACIDVEN  --  1.67 1.58  --   --   --   --   --   VANCOTROUGH  --   --   --   --   --   --  11*  --     Estimated Creatinine Clearance: 39.3 mL/min (by C-G formula based on SCr of 0.72 mg/dL).    Allergies  Allergen Reactions  . Celexa [Citalopram Hydrobromide]     Jerky movements  . Nickel   . Tramadol Hcl     REACTION: nausea and vomiting    Antimicrobials this admission: vanc 10/3 >>  Cefepime 10/3>>  Cultures: 10/3 Blood cx: ngtd 10/3 Urine cx: in process 10/3 MRSA PCR: positive  Thank you for allowing pharmacy to be a part of this patient's care. Maeola Sarah [facility] Clinical Pharmacisst 12/23/2015 8:08 AM

## 2015-12-23 NOTE — Progress Notes (Signed)
PROGRESS NOTE    [name]  [id number] DOB: November 17, 1945 DOA: 12/19/2015 PCP: Chevis Pretty, FNP     Brief Narrative:  70 year old woman admitted on 10/3 from home with complaints of cough and shortness of breath. She was found to have pneumonia that is presumed to be hospital-acquired given her recent hospitalization at the end of August. Overnight 10/4 was noted to be more confused and hypoxemic with increased oxygen requirements.   Assessment & Plan:   Principal Problem:   HCAP (healthcare-associated pneumonia) Active Problems:   COPD exacerbation (HCC)   HTN (hypertension)   RA (rheumatoid arthritis) ([place])   Chronic diastolic CHF (congestive heart failure) ([place])   Hypercarbia   Hypokalemia   Healthcare associated pneumonia -I agree with continued broad-spectrum antibiotic therapy for now. MRSA PCR is positive, will continue vancomycin for now. -Blood cultures remain negative at day 4. -Will need oxygen on DC. -PNA appears worse on repeat CXR, plan to continue broad spectrum abx for now. -She appears clinically improved today.   COPD with acute exacerbation -Seems improved, not currently receiving steroids, do not believe she needs steroids as she has good air movement and no wheezing on exam. -continue nebs and supplemental oxygen as needed.  Acute hypoxemic respiratory failure -Secondary to COPD with exacerbation and healthcare associated pneumonia, see above for details.  Chronic diastolic CHF -Appears volume compensated at present, echo in May 2015 shows ejection fraction of 55-60% with grade 1 diastolic dysfunction.   DVT prophylaxis: Lovenox Code Status: Full code Family Communication: Patient only Disposition Plan: Per PT HHPT with 24 hr supervision  Consultants:   None  Procedures:   None  Antimicrobials:   Vancomycin  Cefepime    Subjective:  Is more awake and talkative today  Objective: Vitals:   12/23/15 0126 12/23/15  0653 12/23/15 0843 12/23/15 1424  BP:  (!) 109/50    Pulse:  62    Resp:  18    Temp:  97.9 F (36.6 C)    TempSrc:  Axillary    SpO2: 100% 92% 98% (!) 85%  Weight:      Height:        Intake/Output Summary (Last 24 hours) at 12/23/15 1442 Last data filed at 12/23/15 1156  Gross per 24 hour  Intake              437 ml  Output              625 ml  Net             -188 ml   Filed Weights   12/19/15 1457 12/19/15 1933  Weight: 38.6 kg (85 lb) 38.6 kg (85 lb 1.6 oz)    Examination:  General exam: Somnolent Respiratory system: Clear to auscultation. Respiratory effort normal. Cardiovascular system:RRR. No murmurs, rubs, gallops. Gastrointestinal system: Abdomen is nondistended, soft and nontender. No organomegaly or masses felt. Normal bowel sounds heard. Central nervous system: Alert and oriented. No focal neurological deficits. Extremities: No C/C/E, +pedal pulses Skin: No rashes, lesions or ulcers Psychiatry: Unable to assess given current mental state    Data Reviewed: I have personally reviewed following labs and imaging studies  CBC:  Recent Labs Lab 12/19/15 1523 12/20/15 0627 12/21/15 0459 12/22/15 0606 12/23/15 0612  WBC 20.5* 9.7 12.8* 16.0* 9.4  NEUTROABS 16.8* 8.9* 9.2* 13.5*  --   HGB 8.0* 7.8* 8.1* 8.0* 7.5*  HCT 27.3* 26.9* 29.0* 28.1* 26.1*  MCV 96.5 97.8 102.1* 100.0 99.6  PLT  223 347 193 306 0000000   Basic Metabolic Panel:  Recent Labs Lab 12/19/15 1523 12/20/15 0627 12/21/15 0459 12/22/15 0606 12/23/15 0612  NA 134* 136 141 137 141  K 3.0* 4.0 4.0 4.3 3.3*  CL 84* 90* 92* 84* 86*  CO2 42* 41* 43* 45* 47*  GLUCOSE 106* 242* 101* 114* 93  BUN 11 12 22* 23* 21*  CREATININE 0.81 0.75 0.76 0.73 0.72  CALCIUM 8.9 8.5* 9.4 9.2 9.0   GFR: Estimated Creatinine Clearance: 39.3 mL/min (by C-G formula based on SCr of 0.72 mg/dL). Liver Function Tests:  Recent Labs Lab 12/19/15 1523  AST 29  ALT 25  ALKPHOS 114  BILITOT 0.5  PROT 6.7    ALBUMIN 2.3*   No results for input(s): LIPASE, AMYLASE in the last 168 hours. No results for input(s): AMMONIA in the last 168 hours. Coagulation Profile: No results for input(s): INR, PROTIME in the last 168 hours. Cardiac Enzymes: No results for input(s): CKTOTAL, CKMB, CKMBINDEX, TROPONINI in the last 168 hours. BNP (last 3 results) No results for input(s): PROBNP in the last 8760 hours. HbA1C: No results for input(s): HGBA1C in the last 72 hours. CBG: No results for input(s): GLUCAP in the last 168 hours. Lipid Profile: No results for input(s): CHOL, HDL, LDLCALC, TRIG, CHOLHDL, LDLDIRECT in the last 72 hours. Thyroid Function Tests: No results for input(s): TSH, T4TOTAL, FREET4, T3FREE, THYROIDAB in the last 72 hours. Anemia Panel: No results for input(s): VITAMINB12, FOLATE, FERRITIN, TIBC, IRON, RETICCTPCT in the last 72 hours. Urine analysis:    Component Value Date/Time   COLORURINE YELLOW 12/19/2015 1506   APPEARANCEUR CLEAR 12/19/2015 1506   LABSPEC <1.005 (L) 12/19/2015 1506   PHURINE 5.5 12/19/2015 1506   GLUCOSEU NEGATIVE 12/19/2015 1506   HGBUR TRACE (A) 12/19/2015 1506   BILIRUBINUR NEGATIVE 12/19/2015 1506   KETONESUR NEGATIVE 12/19/2015 1506   PROTEINUR NEGATIVE 12/19/2015 1506   UROBILINOGEN 0.2 07/29/2013 1915   NITRITE NEGATIVE 12/19/2015 1506   LEUKOCYTESUR TRACE (A) 12/19/2015 1506   Sepsis Labs: @LABRCNTIP (procalcitonin:4,lacticidven:4)  ) Recent Results (from the past 240 hour(s))  Urine culture     Status: Abnormal   Collection Time: 12/19/15  3:06 PM  Result Value Ref Range Status   Specimen Description URINE, CLEAN CATCH  Final   Special Requests NONE  Final   Culture MULTIPLE SPECIES PRESENT, SUGGEST RECOLLECTION (A)  Final   Report Status 12/21/2015 FINAL  Final  Blood culture (routine x 2)     Status: None (Preliminary result)   Collection Time: 12/19/15  3:23 PM  Result Value Ref Range Status   Specimen Description BLOOD LEFT  ANTECUBITAL DRAWN BY RN  Final   Special Requests BOTTLES DRAWN AEROBIC AND ANAEROBIC 5CC  Final   Culture NO GROWTH 4 DAYS  Final   Report Status PENDING  Incomplete  Blood culture (routine x 2)     Status: None (Preliminary result)   Collection Time: 12/19/15  3:23 PM  Result Value Ref Range Status   Specimen Description BLOOD RIGHT ANTECUBITAL  Final   Special Requests BOTTLES DRAWN AEROBIC ONLY 2CC  Final   Culture NO GROWTH 4 DAYS  Final   Report Status PENDING  Incomplete  MRSA PCR Screening     Status: Abnormal   Collection Time: 12/19/15  9:40 PM  Result Value Ref Range Status   MRSA by PCR POSITIVE (A) NEGATIVE Final    Comment: RESULT CALLED TO, READ BACK BY AND VERIFIED WITH: MCGIBBONY,C AT  0145 ON 12/20/2015 BY ISLEY,B        The GeneXpert MRSA Assay (FDA approved for NASAL specimens only), is one component of a comprehensive MRSA colonization surveillance program. It is not intended to diagnose MRSA infection nor to guide or monitor treatment for MRSA infections.          Radiology Studies: Dg Chest Port 1 View  Result Date: 12/21/2015 CLINICAL DATA:  Hypoxia. EXAM: PORTABLE CHEST 1 VIEW COMPARISON:  12/19/2015. FINDINGS: Normal sized heart. Biapical cavitation and scarring with volume loss without significant change. Mildly increased patchy opacity at both lung bases with small bilateral pleural effusions currently demonstrated. Aortic arch calcifications. Calcifications in both distal rotator cuffs. IMPRESSION: 1. Mildly increased bibasilar pneumonia or alveolar edema. 2. Interval small bilateral pleural effusions. 3. Stable biapical cavitation and scarring. This can be seen with active or previous tuberculosis or fungal infection. Electronically Signed   By: Claudie Revering M.D.   On: 12/21/2015 15:52        Scheduled Meds: . amitriptyline  50 mg Oral QHS  . aspirin  325 mg Oral QPM  . buPROPion  150 mg Oral QPM  . ceFEPime (MAXIPIME) IV  1 g Intravenous  Q24H  . Chlorhexidine Gluconate Cloth  6 each Topical Q0600  . enoxaparin (LOVENOX) injection  30 mg Subcutaneous Q24H  . feeding supplement (ENSURE ENLIVE)  237 mL Oral BID BM  . feeding supplement (PRO-STAT SUGAR FREE 64)  30 mL Oral BID  . furosemide  20 mg Oral Daily  . guaiFENesin  600 mg Oral BID  . HYDROcodone-acetaminophen  1 tablet Oral BID  . ipratropium-albuterol  3 mL Nebulization Q6H WA  . loratadine  10 mg Oral Daily  . mouth rinse  15 mL Mouth Rinse BID  . metoprolol  50 mg Oral BID  . multivitamin with minerals  1 tablet Oral Daily  . mupirocin cream   Topical Daily  . mupirocin ointment  1 application Nasal BID  . pantoprazole  40 mg Oral Daily  . sodium chloride  1,000 mL Intravenous Once  . sodium chloride flush  3 mL Intravenous Q12H  . vancomycin  750 mg Intravenous Q24H   Continuous Infusions:    LOS: 4 days    Time spent: 25 minutes. Greater than 50% of this time was spent in direct contact with the patient coordinating care.     Lelon Frohlich, MD [facility] Pager [phone]  If 7PM-7AM, please contact night-coverage [email or web address] Password [email or web address] 12/23/2015, 2:42 PM

## 2015-12-23 NOTE — Progress Notes (Signed)
Temp 100.5. MD notified. Verbal order for 650 mg Tylenol Q6 hr. Oswald Hillock, RN

## 2015-12-24 LAB — CULTURE, BLOOD (ROUTINE X 2)
Culture: NO GROWTH
Culture: NO GROWTH

## 2015-12-24 MED ORDER — IPRATROPIUM-ALBUTEROL 0.5-2.5 (3) MG/3ML IN SOLN
3.0000 mL | Freq: Three times a day (TID) | RESPIRATORY_TRACT | Status: DC
  Administered 2015-12-25: 3 mL via RESPIRATORY_TRACT
  Filled 2015-12-24: qty 3

## 2015-12-24 NOTE — Progress Notes (Signed)
PROGRESS NOTE    [name]  [id number] DOB: [birth year] DOA: 12/19/2015 PCP: Chevis Pretty, FNP     Brief Narrative:  70 year old woman admitted on 10/3 from home with complaints of cough and shortness of breath. She was found to have pneumonia that is presumed to be hospital-acquired given her recent hospitalization at the end of August. Overnight 10/4 was noted to be more confused and hypoxemic with increased oxygen requirements.   Assessment & Plan:   Principal Problem:   HCAP (healthcare-associated pneumonia) Active Problems:   COPD exacerbation (HCC)   HTN (hypertension)   RA (rheumatoid arthritis) ([place])   Chronic diastolic CHF (congestive heart failure) ([place])   Hypercarbia   Hypokalemia   Healthcare associated pneumonia -I agree with continued broad-spectrum antibiotic therapy for now. MRSA PCR is positive, will continue vancomycin for now. -Blood cultures remain negative at day 5. -Will need oxygen on DC. -PNA appears worse on repeat CXR, plan to continue broad spectrum abx for now. -She appears clinically improved today, more coherent as well.   COPD with mild acute exacerbation -Seems improved, not currently receiving steroids, do not believe she needs steroids as she has good air movement and no wheezing on exam. -continue nebs and supplemental oxygen as needed.  Acute hypoxemic respiratory failure -Secondary to COPD with exacerbation and healthcare associated pneumonia, see above for details.  Chronic diastolic CHF -Appears volume compensated at present, echo in May 2015 shows ejection fraction of 55-60% with grade 1 diastolic dysfunction.   DVT prophylaxis: Lovenox Code Status: Full code Family Communication: Patient only Disposition Plan: Per PT HHPT with 24 hr supervision  Consultants:   None  Procedures:   None  Antimicrobials:   Vancomycin  Cefepime    Subjective:  Is more awake and talkative  today  Objective: Vitals:   12/24/15 0735 12/24/15 1027 12/24/15 1416 12/24/15 1435  BP:  103/67  100/66  Pulse:  (!) 104  80  Resp:    16  Temp:    98.2 F (36.8 C)  TempSrc:    Oral  SpO2: 99%  98% 99%  Weight:      Height:        Intake/Output Summary (Last 24 hours) at 12/24/15 1557 Last data filed at 12/24/15 1542  Gross per 24 hour  Intake              460 ml  Output                0 ml  Net              460 ml   Filed Weights   12/19/15 1457 12/19/15 1933 12/24/15 0459  Weight: 38.6 kg (85 lb) 38.6 kg (85 lb 1.6 oz) 38.2 kg (84 lb 3.2 oz)    Examination:  General exam: Somnolent Respiratory system: Clear to auscultation. Respiratory effort normal. Cardiovascular system:RRR. No murmurs, rubs, gallops. Gastrointestinal system: Abdomen is nondistended, soft and nontender. No organomegaly or masses felt. Normal bowel sounds heard. Central nervous system: Alert and oriented. No focal neurological deficits. Extremities: No C/C/E, +pedal pulses Skin: No rashes, lesions or ulcers Psychiatry: Unable to assess given current mental state    Data Reviewed: I have personally reviewed following labs and imaging studies  CBC:  Recent Labs Lab 12/19/15 1523 12/20/15 0627 12/21/15 0459 12/22/15 0606 12/23/15 0612  WBC 20.5* 9.7 12.8* 16.0* 9.4  NEUTROABS 16.8* 8.9* 9.2* 13.5*  --   HGB 8.0* 7.8* 8.1* 8.0* 7.5*  HCT 27.3* 26.9* 29.0* 28.1* 26.1*  MCV 96.5 97.8 102.1* 100.0 99.6  PLT 223 347 193 306 0000000   Basic Metabolic Panel:  Recent Labs Lab 12/19/15 1523 12/20/15 0627 12/21/15 0459 12/22/15 0606 12/23/15 0612  NA 134* 136 141 137 141  K 3.0* 4.0 4.0 4.3 3.3*  CL 84* 90* 92* 84* 86*  CO2 42* 41* 43* 45* 47*  GLUCOSE 106* 242* 101* 114* 93  BUN 11 12 22* 23* 21*  CREATININE 0.81 0.75 0.76 0.73 0.72  CALCIUM 8.9 8.5* 9.4 9.2 9.0   GFR: Estimated Creatinine Clearance: 38.9 mL/min (by C-G formula based on SCr of 0.72 mg/dL). Liver Function  Tests:  Recent Labs Lab 12/19/15 1523  AST 29  ALT 25  ALKPHOS 114  BILITOT 0.5  PROT 6.7  ALBUMIN 2.3*   No results for input(s): LIPASE, AMYLASE in the last 168 hours. No results for input(s): AMMONIA in the last 168 hours. Coagulation Profile: No results for input(s): INR, PROTIME in the last 168 hours. Cardiac Enzymes: No results for input(s): CKTOTAL, CKMB, CKMBINDEX, TROPONINI in the last 168 hours. BNP (last 3 results) No results for input(s): PROBNP in the last 8760 hours. HbA1C: No results for input(s): HGBA1C in the last 72 hours. CBG: No results for input(s): GLUCAP in the last 168 hours. Lipid Profile: No results for input(s): CHOL, HDL, LDLCALC, TRIG, CHOLHDL, LDLDIRECT in the last 72 hours. Thyroid Function Tests: No results for input(s): TSH, T4TOTAL, FREET4, T3FREE, THYROIDAB in the last 72 hours. Anemia Panel: No results for input(s): VITAMINB12, FOLATE, FERRITIN, TIBC, IRON, RETICCTPCT in the last 72 hours. Urine analysis:    Component Value Date/Time   COLORURINE YELLOW 12/19/2015 1506   APPEARANCEUR CLEAR 12/19/2015 1506   LABSPEC <1.005 (L) 12/19/2015 1506   PHURINE 5.5 12/19/2015 1506   GLUCOSEU NEGATIVE 12/19/2015 1506   HGBUR TRACE (A) 12/19/2015 1506   BILIRUBINUR NEGATIVE 12/19/2015 1506   KETONESUR NEGATIVE 12/19/2015 1506   PROTEINUR NEGATIVE 12/19/2015 1506   UROBILINOGEN 0.2 07/29/2013 1915   NITRITE NEGATIVE 12/19/2015 1506   LEUKOCYTESUR TRACE (A) 12/19/2015 1506   Sepsis Labs: @LABRCNTIP (procalcitonin:4,lacticidven:4)  ) Recent Results (from the past 240 hour(s))  Urine culture     Status: Abnormal   Collection Time: 12/19/15  3:06 PM  Result Value Ref Range Status   Specimen Description URINE, CLEAN CATCH  Final   Special Requests NONE  Final   Culture MULTIPLE SPECIES PRESENT, SUGGEST RECOLLECTION (A)  Final   Report Status 12/21/2015 FINAL  Final  Blood culture (routine x 2)     Status: None   Collection Time: 12/19/15   3:23 PM  Result Value Ref Range Status   Specimen Description BLOOD LEFT ANTECUBITAL DRAWN BY RN  Final   Special Requests BOTTLES DRAWN AEROBIC AND ANAEROBIC 5CC  Final   Culture NO GROWTH 5 DAYS  Final   Report Status 12/24/2015 FINAL  Final  Blood culture (routine x 2)     Status: None   Collection Time: 12/19/15  3:23 PM  Result Value Ref Range Status   Specimen Description BLOOD RIGHT ANTECUBITAL  Final   Special Requests BOTTLES DRAWN AEROBIC ONLY 2CC  Final   Culture NO GROWTH 5 DAYS  Final   Report Status 12/24/2015 FINAL  Final  MRSA PCR Screening     Status: Abnormal   Collection Time: 12/19/15  9:40 PM  Result Value Ref Range Status   MRSA by PCR POSITIVE (A) NEGATIVE Final  Comment: RESULT CALLED TO, READ BACK BY AND VERIFIED WITH: MCGIBBONY,C AT 0145 ON 12/20/2015 BY ISLEY,B        The GeneXpert MRSA Assay (FDA approved for NASAL specimens only), is one component of a comprehensive MRSA colonization surveillance program. It is not intended to diagnose MRSA infection nor to guide or monitor treatment for MRSA infections.          Radiology Studies: No results found.      Scheduled Meds: . amitriptyline  50 mg Oral QHS  . aspirin  325 mg Oral QPM  . buPROPion  150 mg Oral QPM  . ceFEPime (MAXIPIME) IV  1 g Intravenous Q24H  . Chlorhexidine Gluconate Cloth  6 each Topical Q0600  . enoxaparin (LOVENOX) injection  30 mg Subcutaneous Q24H  . feeding supplement (ENSURE ENLIVE)  237 mL Oral BID BM  . feeding supplement (PRO-STAT SUGAR FREE 64)  30 mL Oral BID  . furosemide  20 mg Oral Daily  . guaiFENesin  600 mg Oral BID  . HYDROcodone-acetaminophen  1 tablet Oral BID  . ipratropium-albuterol  3 mL Nebulization Q6H WA  . loratadine  10 mg Oral Daily  . mouth rinse  15 mL Mouth Rinse BID  . metoprolol  50 mg Oral BID  . multivitamin with minerals  1 tablet Oral Daily  . mupirocin cream   Topical Daily  . pantoprazole  40 mg Oral Daily  . sodium  chloride  1,000 mL Intravenous Once  . sodium chloride flush  3 mL Intravenous Q12H  . vancomycin  750 mg Intravenous Q24H   Continuous Infusions:    LOS: 5 days    Time spent: 25 minutes. Greater than 50% of this time was spent in direct contact with the patient coordinating care.     Lelon Frohlich, MD [facility] Pager [phone]  If 7PM-7AM, please contact night-coverage [email or web address] Password [email or web address] 12/24/2015, 3:57 PM

## 2015-12-25 ENCOUNTER — Ambulatory Visit: Payer: Medicare Other

## 2015-12-25 DIAGNOSIS — Z23 Encounter for immunization: Secondary | ICD-10-CM | POA: Diagnosis not present

## 2015-12-25 LAB — BASIC METABOLIC PANEL
ANION GAP: 9 (ref 5–15)
BUN: 24 mg/dL — ABNORMAL HIGH (ref 6–20)
CO2: 45 mmol/L — ABNORMAL HIGH (ref 22–32)
CREATININE: 0.82 mg/dL (ref 0.44–1.00)
Calcium: 9 mg/dL (ref 8.9–10.3)
Chloride: 84 mmol/L — ABNORMAL LOW (ref 101–111)
Glucose, Bld: 118 mg/dL — ABNORMAL HIGH (ref 65–99)
Potassium: 3.8 mmol/L (ref 3.5–5.1)
SODIUM: 138 mmol/L (ref 135–145)

## 2015-12-25 LAB — CBC
HCT: 28.3 % — ABNORMAL LOW (ref 36.0–46.0)
Hemoglobin: 8.1 g/dL — ABNORMAL LOW (ref 12.0–15.0)
MCH: 28.2 pg (ref 26.0–34.0)
MCHC: 28.6 g/dL — ABNORMAL LOW (ref 30.0–36.0)
MCV: 98.6 fL (ref 78.0–100.0)
PLATELETS: 368 10*3/uL (ref 150–400)
RBC: 2.87 MIL/uL — AB (ref 3.87–5.11)
RDW: 14.3 % (ref 11.5–15.5)
WBC: 12.8 10*3/uL — AB (ref 4.0–10.5)

## 2015-12-25 MED ORDER — KETOROLAC TROMETHAMINE 15 MG/ML IJ SOLN
15.0000 mg | Freq: Once | INTRAMUSCULAR | Status: AC
  Administered 2015-12-25: 15 mg via INTRAVENOUS
  Filled 2015-12-25: qty 1

## 2015-12-25 MED ORDER — LEVOFLOXACIN 750 MG PO TABS: 750.0000 mg | ORAL_TABLET | Freq: Every day | ORAL | 0 refills | Status: DC

## 2015-12-25 NOTE — Progress Notes (Signed)
Patient discharged home. IV removed site intact.  Patient discharged with prescriptions and all personal belongings.

## 2015-12-25 NOTE — Care Management Note (Signed)
Case Management Note  Patient Details  Name: DAISYMAE BRUMETT MRN: [id number] Date of Birth: 09-24-1945  Expected Discharge Date:                  Expected Discharge Plan:  [place]  In-House Referral:  NA  Discharge planning Services  CM Consult  Post Acute Care Choice:  [place], Resumption of Svcs/PTA Provider Choice offered to:  Patient  DME Arranged:    DME Agency:     HH Arranged:  RN, PT [place] Agency:  [facility]  Status of Service:  In process, will continue to follow  If discussed at Long Length of Stay Meetings, dates discussed:    Additional Comments: Patient is discharging home today. Patient states that she has oxygen with [facility], will have a tank delivered to room prior to discharge. [facility] notified patient is discharging, [place] services will be resumed. Family made aware that [facility] Go has 48 hours to make first visit. Patient will have 24 hour supervision while at home.  Dechelle Attaway, [place]uncey [place], RN 12/25/2015, 1:07 PM
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
## 2015-12-26 ENCOUNTER — Telehealth: Payer: Self-pay | Admitting: *Deleted

## 2015-12-26 NOTE — Telephone Encounter (Signed)
Call Completed and Appointment Scheduled: Yes, Date: 01/04/16` with Chevis Pretty, [place] Date of Discharge:12/25/2015  Discharge Facility: [facility]  Principal Discharge Diagnosis: Cough, Congestion, Sepsis  Patient and/or caregiver is knowledgeable of his/her condition(s) and treatment: Yes  MEDICATION RECONCILIATION Current medication list reviewed with patient:Yes Discharge Medications reviewed and reconciled with current medications.yes  Patient is able to obtain needed medications:Yes  ACTIVITIES OF DAILY LIVING  Is the patient able to perform his/her own ADLs: Yes.    Patient is receiving home health services: Yes.   Twice weekly  PATIENT EDUCATION Questions/Concerns Discussed: Patient remains week but feels better. She is using a walker or wheelchair and has a bedside commode. She is also doing chair exercises. Will f/u sooner if necessary.

## 2015-12-28 ENCOUNTER — Telehealth: Payer: Self-pay | Admitting: *Deleted

## 2015-12-28 DIAGNOSIS — S81811D Laceration without foreign body, right lower leg, subsequent encounter: Secondary | ICD-10-CM | POA: Diagnosis not present

## 2015-12-28 DIAGNOSIS — I739 Peripheral vascular disease, unspecified: Secondary | ICD-10-CM | POA: Diagnosis not present

## 2015-12-28 DIAGNOSIS — Z4800 Encounter for change or removal of nonsurgical wound dressing: Secondary | ICD-10-CM | POA: Diagnosis not present

## 2015-12-28 DIAGNOSIS — E43 Unspecified severe protein-calorie malnutrition: Secondary | ICD-10-CM | POA: Diagnosis not present

## 2015-12-28 DIAGNOSIS — I5032 Chronic diastolic (congestive) heart failure: Secondary | ICD-10-CM | POA: Diagnosis not present

## 2015-12-28 DIAGNOSIS — J9622 Acute and chronic respiratory failure with hypercapnia: Secondary | ICD-10-CM | POA: Diagnosis not present

## 2015-12-28 DIAGNOSIS — I110 Hypertensive heart disease with heart failure: Secondary | ICD-10-CM | POA: Diagnosis not present

## 2015-12-28 DIAGNOSIS — R627 Adult failure to thrive: Secondary | ICD-10-CM | POA: Diagnosis not present

## 2015-12-28 DIAGNOSIS — M069 Rheumatoid arthritis, unspecified: Secondary | ICD-10-CM | POA: Diagnosis not present

## 2015-12-28 DIAGNOSIS — J441 Chronic obstructive pulmonary disease with (acute) exacerbation: Secondary | ICD-10-CM | POA: Diagnosis not present

## 2015-12-28 DIAGNOSIS — D539 Nutritional anemia, unspecified: Secondary | ICD-10-CM | POA: Diagnosis not present

## 2015-12-28 DIAGNOSIS — L89620 Pressure ulcer of left heel, unstageable: Secondary | ICD-10-CM | POA: Diagnosis not present

## 2015-12-28 DIAGNOSIS — Z7982 Long term (current) use of aspirin: Secondary | ICD-10-CM | POA: Diagnosis not present

## 2015-12-28 NOTE — Telephone Encounter (Signed)
TC from [facility]. Family & home health concerned about pt being more confused & falling more often since last week. They are doing dssg changes on left foot now pt has purple bruising on the right foot now. BP is 100/50 O2 Sats have been 93 then 97 on 3L. Consulted with Dr. Evette Doffing who recommended pt to come in tomorrow instead of next week. Appt has been made in morning w/ Dr. Warrick Parisian. Also informed to hold pain meds, metoprolol, lasix and allergy medications.

## 2015-12-29 ENCOUNTER — Encounter (HOSPITAL_COMMUNITY): Payer: Self-pay | Admitting: Emergency Medicine

## 2015-12-29 ENCOUNTER — Emergency Department (HOSPITAL_COMMUNITY): Payer: Medicare Other

## 2015-12-29 ENCOUNTER — Inpatient Hospital Stay (HOSPITAL_COMMUNITY)
Admission: EM | Admit: 2015-12-29 | Discharge: 2016-01-05 | DRG: 208 | Disposition: A | Discharge status: Home / Self Care | Payer: Medicare Other | Attending: Internal Medicine | Admitting: Internal Medicine

## 2015-12-29 ENCOUNTER — Ambulatory Visit: Payer: Medicare Other | Admitting: Family Medicine

## 2015-12-29 DIAGNOSIS — N179 Acute kidney failure, unspecified: Secondary | ICD-10-CM | POA: Diagnosis not present

## 2015-12-29 DIAGNOSIS — E872 Acidosis: Secondary | ICD-10-CM | POA: Diagnosis not present

## 2015-12-29 DIAGNOSIS — M069 Rheumatoid arthritis, unspecified: Secondary | ICD-10-CM | POA: Diagnosis present

## 2015-12-29 DIAGNOSIS — I110 Hypertensive heart disease with heart failure: Secondary | ICD-10-CM | POA: Diagnosis not present

## 2015-12-29 DIAGNOSIS — Z978 Presence of other specified devices: Secondary | ICD-10-CM

## 2015-12-29 DIAGNOSIS — J441 Chronic obstructive pulmonary disease with (acute) exacerbation: Secondary | ICD-10-CM | POA: Diagnosis not present

## 2015-12-29 DIAGNOSIS — J440 Chronic obstructive pulmonary disease with acute lower respiratory infection: Principal | ICD-10-CM | POA: Diagnosis present

## 2015-12-29 DIAGNOSIS — J849 Interstitial pulmonary disease, unspecified: Secondary | ICD-10-CM | POA: Diagnosis present

## 2015-12-29 DIAGNOSIS — R569 Unspecified convulsions: Secondary | ICD-10-CM

## 2015-12-29 DIAGNOSIS — J9621 Acute and chronic respiratory failure with hypoxia: Secondary | ICD-10-CM | POA: Diagnosis not present

## 2015-12-29 DIAGNOSIS — Y95 Nosocomial condition: Secondary | ICD-10-CM | POA: Diagnosis present

## 2015-12-29 DIAGNOSIS — J9602 Acute respiratory failure with hypercapnia: Secondary | ICD-10-CM | POA: Diagnosis not present

## 2015-12-29 DIAGNOSIS — I5032 Chronic diastolic (congestive) heart failure: Secondary | ICD-10-CM | POA: Diagnosis present

## 2015-12-29 DIAGNOSIS — R739 Hyperglycemia, unspecified: Secondary | ICD-10-CM | POA: Diagnosis not present

## 2015-12-29 DIAGNOSIS — Z681 Body mass index (BMI) 19 or less, adult: Secondary | ICD-10-CM | POA: Diagnosis not present

## 2015-12-29 DIAGNOSIS — G92 Toxic encephalopathy: Secondary | ICD-10-CM | POA: Diagnosis not present

## 2015-12-29 DIAGNOSIS — E785 Hyperlipidemia, unspecified: Secondary | ICD-10-CM | POA: Diagnosis present

## 2015-12-29 DIAGNOSIS — Z820 Family history of epilepsy and other diseases of the nervous system: Secondary | ICD-10-CM

## 2015-12-29 DIAGNOSIS — Z7951 Long term (current) use of inhaled steroids: Secondary | ICD-10-CM

## 2015-12-29 DIAGNOSIS — Z87891 Personal history of nicotine dependence: Secondary | ICD-10-CM

## 2015-12-29 DIAGNOSIS — I739 Peripheral vascular disease, unspecified: Secondary | ICD-10-CM | POA: Diagnosis present

## 2015-12-29 DIAGNOSIS — E43 Unspecified severe protein-calorie malnutrition: Secondary | ICD-10-CM | POA: Diagnosis not present

## 2015-12-29 DIAGNOSIS — E876 Hypokalemia: Secondary | ICD-10-CM | POA: Diagnosis present

## 2015-12-29 DIAGNOSIS — Z9981 Dependence on supplemental oxygen: Secondary | ICD-10-CM | POA: Diagnosis not present

## 2015-12-29 DIAGNOSIS — F419 Anxiety disorder, unspecified: Secondary | ICD-10-CM | POA: Diagnosis not present

## 2015-12-29 DIAGNOSIS — Z4682 Encounter for fitting and adjustment of non-vascular catheter: Secondary | ICD-10-CM | POA: Diagnosis not present

## 2015-12-29 DIAGNOSIS — Z8249 Family history of ischemic heart disease and other diseases of the circulatory system: Secondary | ICD-10-CM

## 2015-12-29 DIAGNOSIS — I4891 Unspecified atrial fibrillation: Secondary | ICD-10-CM | POA: Diagnosis present

## 2015-12-29 DIAGNOSIS — R54 Age-related physical debility: Secondary | ICD-10-CM | POA: Diagnosis present

## 2015-12-29 DIAGNOSIS — J9601 Acute respiratory failure with hypoxia: Secondary | ICD-10-CM | POA: Diagnosis not present

## 2015-12-29 DIAGNOSIS — R4182 Altered mental status, unspecified: Secondary | ICD-10-CM | POA: Diagnosis present

## 2015-12-29 DIAGNOSIS — J15212 Pneumonia due to Methicillin resistant Staphylococcus aureus: Secondary | ICD-10-CM | POA: Diagnosis present

## 2015-12-29 DIAGNOSIS — Z7982 Long term (current) use of aspirin: Secondary | ICD-10-CM

## 2015-12-29 DIAGNOSIS — J9622 Acute and chronic respiratory failure with hypercapnia: Secondary | ICD-10-CM | POA: Diagnosis not present

## 2015-12-29 DIAGNOSIS — R571 Hypovolemic shock: Secondary | ICD-10-CM | POA: Diagnosis present

## 2015-12-29 DIAGNOSIS — L8990 Pressure ulcer of unspecified site, unspecified stage: Secondary | ICD-10-CM | POA: Insufficient documentation

## 2015-12-29 DIAGNOSIS — J9600 Acute respiratory failure, unspecified whether with hypoxia or hypercapnia: Secondary | ICD-10-CM | POA: Diagnosis not present

## 2015-12-29 DIAGNOSIS — J90 Pleural effusion, not elsewhere classified: Secondary | ICD-10-CM | POA: Diagnosis not present

## 2015-12-29 DIAGNOSIS — R6510 Systemic inflammatory response syndrome (SIRS) of non-infectious origin without acute organ dysfunction: Secondary | ICD-10-CM | POA: Diagnosis present

## 2015-12-29 DIAGNOSIS — J189 Pneumonia, unspecified organism: Secondary | ICD-10-CM | POA: Diagnosis not present

## 2015-12-29 DIAGNOSIS — M6281 Muscle weakness (generalized): Secondary | ICD-10-CM

## 2015-12-29 DIAGNOSIS — Y653 Endotracheal tube wrongly placed during anesthetic procedure: Secondary | ICD-10-CM | POA: Diagnosis not present

## 2015-12-29 DIAGNOSIS — T380X5A Adverse effect of glucocorticoids and synthetic analogues, initial encounter: Secondary | ICD-10-CM | POA: Diagnosis present

## 2015-12-29 DIAGNOSIS — J9690 Respiratory failure, unspecified, unspecified whether with hypoxia or hypercapnia: Secondary | ICD-10-CM | POA: Diagnosis present

## 2015-12-29 DIAGNOSIS — R296 Repeated falls: Secondary | ICD-10-CM | POA: Diagnosis present

## 2015-12-29 DIAGNOSIS — L409 Psoriasis, unspecified: Secondary | ICD-10-CM | POA: Diagnosis present

## 2015-12-29 DIAGNOSIS — D649 Anemia, unspecified: Secondary | ICD-10-CM | POA: Diagnosis present

## 2015-12-29 DIAGNOSIS — S199XXA Unspecified injury of neck, initial encounter: Secondary | ICD-10-CM | POA: Diagnosis not present

## 2015-12-29 DIAGNOSIS — Z96641 Presence of right artificial hip joint: Secondary | ICD-10-CM | POA: Diagnosis present

## 2015-12-29 DIAGNOSIS — J9620 Acute and chronic respiratory failure, unspecified whether with hypoxia or hypercapnia: Secondary | ICD-10-CM | POA: Diagnosis not present

## 2015-12-29 DIAGNOSIS — Z4659 Encounter for fitting and adjustment of other gastrointestinal appliance and device: Secondary | ICD-10-CM

## 2015-12-29 HISTORY — DX: Pneumonia, unspecified organism: J18.9

## 2015-12-29 HISTORY — DX: Unspecified osteoarthritis, unspecified site: M19.90

## 2015-12-29 HISTORY — DX: Unspecified asthma, uncomplicated: J45.909

## 2015-12-29 HISTORY — DX: Anemia, unspecified: D64.9

## 2015-12-29 HISTORY — DX: Major depressive disorder, single episode, unspecified: F32.9

## 2015-12-29 HISTORY — DX: Cardiac murmur, unspecified: R01.1

## 2015-12-29 HISTORY — DX: Pure hypercholesterolemia, unspecified: E78.00

## 2015-12-29 HISTORY — DX: Anxiety disorder, unspecified: F41.9

## 2015-12-29 HISTORY — DX: Arthropathic psoriasis, unspecified: L40.50

## 2015-12-29 HISTORY — DX: Unspecified chronic bronchitis: J42

## 2015-12-29 HISTORY — DX: Personal history of other medical treatment: Z92.89

## 2015-12-29 HISTORY — DX: Low back pain: M54.5

## 2015-12-29 HISTORY — DX: Other chronic pain: G89.29

## 2015-12-29 HISTORY — DX: Depression, unspecified: F32.A

## 2015-12-29 HISTORY — DX: Low back pain, unspecified: M54.50

## 2015-12-29 LAB — BLOOD GAS, ARTERIAL
ALLENS TEST (PASS/FAIL): POSITIVE — AB
Acid-Base Excess: 9.8 mmol/L — ABNORMAL HIGH (ref 0.0–2.0)
Bicarbonate: 33.2 mmol/L — ABNORMAL HIGH (ref 20.0–28.0)
DRAWN BY: 382351
FIO2: 60
MECHVT: 420 mL
O2 Saturation: 99.4 %
PEEP: 5 cmH2O
PO2 ART: 245 mmHg — AB (ref 83.0–108.0)
Patient temperature: 37
RATE: 14 resp/min
pCO2 arterial: 65.8 mmHg (ref 32.0–48.0)
pH, Arterial: 7.351 (ref 7.350–7.450)

## 2015-12-29 LAB — COMPREHENSIVE METABOLIC PANEL
ALK PHOS: 79 U/L (ref 38–126)
ALT: 14 U/L (ref 14–54)
AST: 20 U/L (ref 15–41)
Albumin: 2.4 g/dL — ABNORMAL LOW (ref 3.5–5.0)
Anion gap: 10 (ref 5–15)
BUN: 12 mg/dL (ref 6–20)
CALCIUM: 8.1 mg/dL — AB (ref 8.9–10.3)
CO2: 41 mmol/L — AB (ref 22–32)
CREATININE: 1.03 mg/dL — AB (ref 0.44–1.00)
Chloride: 85 mmol/L — ABNORMAL LOW (ref 101–111)
GFR calc non Af Amer: 53 mL/min — ABNORMAL LOW (ref >60)
GLUCOSE: 93 mg/dL (ref 65–99)
Potassium: 3.5 mmol/L (ref 3.5–5.1)
SODIUM: 136 mmol/L (ref 135–145)
Total Bilirubin: 0.5 mg/dL (ref 0.3–1.2)
Total Protein: 7 g/dL (ref 6.5–8.1)

## 2015-12-29 LAB — URINALYSIS, ROUTINE W REFLEX MICROSCOPIC
Bilirubin Urine: NEGATIVE
GLUCOSE, UA: NEGATIVE mg/dL
Hgb urine dipstick: NEGATIVE
KETONES UR: NEGATIVE mg/dL
LEUKOCYTES UA: NEGATIVE
NITRITE: NEGATIVE
PROTEIN: NEGATIVE mg/dL
Specific Gravity, Urine: 1.01 (ref 1.005–1.030)
pH: 6.5 (ref 5.0–8.0)

## 2015-12-29 LAB — CBG MONITORING, ED: Glucose-Capillary: 95 mg/dL (ref 65–99)

## 2015-12-29 LAB — CBC WITH DIFFERENTIAL/PLATELET
BASOS ABS: 0 10*3/uL (ref 0.0–0.1)
Basophils Relative: 0 %
EOS ABS: 0 10*3/uL (ref 0.0–0.7)
Eosinophils Relative: 0 %
HCT: 26.5 % — ABNORMAL LOW (ref 36.0–46.0)
HEMOGLOBIN: 7.8 g/dL — AB (ref 12.0–15.0)
LYMPHS ABS: 3.4 10*3/uL (ref 0.7–4.0)
LYMPHS PCT: 16 %
MCH: 28.3 pg (ref 26.0–34.0)
MCHC: 29.4 g/dL — ABNORMAL LOW (ref 30.0–36.0)
MCV: 96 fL (ref 78.0–100.0)
Monocytes Absolute: 2 10*3/uL — ABNORMAL HIGH (ref 0.1–1.0)
Monocytes Relative: 10 %
NEUTROS PCT: 74 %
Neutro Abs: 15.5 10*3/uL — ABNORMAL HIGH (ref 1.7–7.7)
Platelets: 416 10*3/uL — ABNORMAL HIGH (ref 150–400)
RBC: 2.76 MIL/uL — AB (ref 3.87–5.11)
RDW: 14.8 % (ref 11.5–15.5)
WBC: 20.9 10*3/uL — AB (ref 4.0–10.5)

## 2015-12-29 LAB — I-STAT CHEM 8, ED
BUN: 11 mg/dL (ref 6–20)
Calcium, Ion: 0.98 mmol/L — ABNORMAL LOW (ref 1.15–1.40)
Chloride: 81 mmol/L — ABNORMAL LOW (ref 101–111)
Creatinine, Ser: 1 mg/dL (ref 0.44–1.00)
Glucose, Bld: 91 mg/dL (ref 65–99)
HEMATOCRIT: 27 % — AB (ref 36.0–46.0)
HEMOGLOBIN: 9.2 g/dL — AB (ref 12.0–15.0)
POTASSIUM: 3.4 mmol/L — AB (ref 3.5–5.1)
SODIUM: 136 mmol/L (ref 135–145)
TCO2: 40 mmol/L (ref 0–100)

## 2015-12-29 LAB — I-STAT TROPONIN, ED: Troponin i, poc: 0 ng/mL (ref 0.00–0.08)

## 2015-12-29 LAB — I-STAT CG4 LACTIC ACID, ED: LACTIC ACID, VENOUS: 2.59 mmol/L — AB (ref 0.5–1.9)

## 2015-12-29 LAB — MAGNESIUM: Magnesium: 0.9 mg/dL — CL (ref 1.7–2.4)

## 2015-12-29 LAB — BRAIN NATRIURETIC PEPTIDE: B NATRIURETIC PEPTIDE 5: 222 pg/mL — AB (ref 0.0–100.0)

## 2015-12-29 MED ORDER — FENTANYL CITRATE (PF) 100 MCG/2ML IJ SOLN: 50.0000 ug | INTRAMUSCULAR | Status: DC | PRN

## 2015-12-29 MED ORDER — PROPOFOL 1000 MG/100ML IV EMUL: 5.0000 ug/kg/min | Freq: Once | INTRAVENOUS | Status: DC

## 2015-12-29 MED ORDER — PIPERACILLIN-TAZOBACTAM 3.375 G IVPB
3.3750 g | Freq: Three times a day (TID) | INTRAVENOUS | Status: DC
  Administered 2015-12-30 – 2016-01-02 (×12): 3.375 g via INTRAVENOUS
  Filled 2015-12-29 (×14): qty 50

## 2015-12-29 MED ORDER — SODIUM CHLORIDE 0.9 % IV BOLUS (SEPSIS)
1000.0000 mL | Freq: Once | INTRAVENOUS | Status: AC
  Administered 2015-12-29: 1000 mL via INTRAVENOUS

## 2015-12-29 MED ORDER — VANCOMYCIN HCL IN DEXTROSE 1-5 GM/200ML-% IV SOLN
1000.0000 mg | Freq: Once | INTRAVENOUS | Status: AC
  Administered 2015-12-29: 1000 mg via INTRAVENOUS
  Filled 2015-12-29: qty 200

## 2015-12-29 MED ORDER — SODIUM CHLORIDE 0.9 % IV SOLN
INTRAVENOUS | Status: DC
  Administered 2015-12-29 (×2): via INTRAVENOUS

## 2015-12-29 MED ORDER — MAGNESIUM SULFATE 2 GM/50ML IV SOLN
2.0000 g | Freq: Once | INTRAVENOUS | Status: AC
  Administered 2015-12-29: 2 g via INTRAVENOUS
  Filled 2015-12-29: qty 50

## 2015-12-29 MED ORDER — PROPOFOL 1000 MG/100ML IV EMUL
INTRAVENOUS | Status: AC
  Filled 2015-12-29: qty 100

## 2015-12-29 MED ORDER — PROPOFOL 1000 MG/100ML IV EMUL
5.0000 ug/kg/min | Freq: Once | INTRAVENOUS | Status: AC
  Administered 2015-12-29: 5 ug/kg/min via INTRAVENOUS

## 2015-12-29 MED ORDER — IPRATROPIUM-ALBUTEROL 0.5-2.5 (3) MG/3ML IN SOLN
RESPIRATORY_TRACT | Status: AC
  Administered 2015-12-29: 3 mL
  Filled 2015-12-29: qty 3

## 2015-12-29 MED ORDER — ALBUTEROL SULFATE (2.5 MG/3ML) 0.083% IN NEBU
INHALATION_SOLUTION | RESPIRATORY_TRACT | Status: AC
  Administered 2015-12-29: 2.5 mg
  Filled 2015-12-29: qty 3

## 2015-12-29 MED ORDER — PIPERACILLIN-TAZOBACTAM 3.375 G IVPB 30 MIN
3.3750 g | Freq: Once | INTRAVENOUS | Status: AC
  Administered 2015-12-29: 3.375 g via INTRAVENOUS
  Filled 2015-12-29: qty 50

## 2015-12-29 MED ORDER — VANCOMYCIN HCL IN DEXTROSE 750-5 MG/150ML-% IV SOLN
750.0000 mg | INTRAVENOUS | Status: DC
  Administered 2015-12-30 – 2016-01-02 (×4): 750 mg via INTRAVENOUS
  Filled 2015-12-29 (×7): qty 150

## 2015-12-29 MED ORDER — MIDAZOLAM HCL 2 MG/2ML IJ SOLN
1.0000 mg | INTRAMUSCULAR | Status: DC | PRN
  Administered 2015-12-30: 1 mg via INTRAVENOUS
  Filled 2015-12-29 (×2): qty 2

## 2015-12-29 MED ORDER — ETOMIDATE 2 MG/ML IV SOLN
INTRAVENOUS | Status: DC | PRN
  Administered 2015-12-29: 10 mg via INTRAVENOUS

## 2015-12-29 MED ORDER — MIDAZOLAM HCL 2 MG/2ML IJ SOLN
1.0000 mg | INTRAMUSCULAR | Status: AC | PRN
  Administered 2015-12-29 (×3): 1 mg via INTRAVENOUS
  Filled 2015-12-29 (×2): qty 2

## 2015-12-29 MED ORDER — SODIUM CHLORIDE 0.9 % IV BOLUS (SEPSIS): 250.0000 mL | Freq: Once | INTRAVENOUS | Status: DC

## 2015-12-29 MED ORDER — FENTANYL CITRATE (PF) 100 MCG/2ML IJ SOLN
50.0000 ug | INTRAMUSCULAR | Status: DC | PRN
  Administered 2015-12-29 (×2): 50 ug via INTRAVENOUS
  Filled 2015-12-29 (×2): qty 2

## 2015-12-29 NOTE — ED Provider Notes (Signed)
[facility] DEPT Provider Note   CSN: [id number] Arrival date & time: 12/29/15  [id number]     History   Chief Complaint Chief Complaint  Patient presents with  . Altered Mental Status    HPI [name] is a 70 y.o. female.  The history is provided by the spouse and a relative. The history is limited by the condition of the patient (AMS).  Altered Mental Status    Pt was seen at 1900. Per pt's family: Pt discharged from the hospital 4 days ago for dx pneumonia, COPD, on home O2. Pt's family states pt has had progressive AMS for the past 4 days, worse since yesterday. Describes the AMS as "talking out of her head." Pt's family states pt has fallen multiple times since hospital discharge and "has shook" at times. Pt's husband states since yesterday she has been unable to walk and "not talking anymore." Pt pulled from car by ED staff, moaning.    Past Medical History:  Diagnosis Date  . Anxiety disorder   . Atrial fibrillation ([place])   . Avascular necrosis of hip ([place])    left  . CHF (congestive heart failure) ([place])   . COPD (chronic obstructive pulmonary disease) ([place])   . ETOH abuse   . HTN (hypertension)   . Hyperglycemia   . On home O2    3L N/C  . Psoriasis     Patient Active Problem List   Diagnosis Date Noted  . HCAP (healthcare-associated pneumonia) 12/19/2015  . Hypercarbia 12/19/2015  . Hypokalemia 12/19/2015  . Protein-calorie malnutrition, severe 10/18/2015  . Acute and chronic respiratory failure with hypercapnia ([place]) 10/15/2015  . Anxiety 10/15/2015  . Chronic diastolic CHF (congestive heart failure) ([place]) 10/15/2015  . Macrocytic anemia 10/15/2015  . Acute respiratory failure with hypoxia and hypercapnia (HCC)   . Atherosclerotic peripheral vascular disease with intermittent claudication ([place]) 06/09/2014  . FTT (failure to thrive) in adult 07/31/2013  . CHF (congestive heart failure) ([place]) 06/10/2013  . 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 Osteopenia  06/16/2010  . Hyperlipidemia 06/16/2010  . HTN (hypertension) 06/16/2010  . Insomnia 06/16/2010  . RA (rheumatoid arthritis) ([place]) 06/16/2010  . RHEUMATIC FEVER, HX OF 04/20/2009  . COMPUTERIZED TOMOGRAPHY, CHEST, ABNORMAL 04/20/2009  . COPD exacerbation ([place]) 04/18/2009    Past Surgical History:  Procedure Laterality Date  . JOINT REPLACEMENT    . TONSILLECTOMY    . TOTAL HIP ARTHROPLASTY  05-15-08   r hip       Home Medications    Prior to Admission medications   Medication Sig Start Date End Date Taking? Authorizing Provider  albuterol (PROAIR HFA) 108 (90 BASE) MCG/ACT inhaler INHALE 2 PUFFS INTO THE LUNGS EVERY 6 HOURS AS NEEDED FOR WHEEZING OR SHORTNESS OF BREATH 03/07/15   Mary-Margaret Hassell Done, FNP  albuterol (PROVENTIL) (2.5 MG/3ML) 0.083% nebulizer solution Take 2.5 mg by nebulization every morning.    Historical Provider, MD  ALPRAZolam Duanne Moron) 0.5 MG tablet Take 1 tablet (0.5 mg total) by mouth 2 (two) times daily as needed for anxiety. 11/23/15   Mary-Margaret Hassell Done, FNP  amitriptyline (ELAVIL) 50 MG tablet Take 1 tablet by mouth at  bedtime 12/23/14   Mary-Margaret Hassell Done, FNP  aspirin 325 MG tablet Take 325 mg by mouth every evening.     Historical Provider, MD  buPROPion (WELLBUTRIN XL) 150 MG 24 hr tablet Take 1 tablet by mouth  daily Patient taking differently: Take 1 tablet by mouth  daily in the evening 12/11/15  Mary-Margaret Hassell Done, FNP  cetirizine (ALL DAY ALLERGY) 10 MG tablet Take 10 mg by mouth at bedtime.     Historical Provider, MD  cholecalciferol (VITAMIN D) 1000 UNITS tablet Take 1,000 Units by mouth every morning.     Historical Provider, MD  EQL CALCIUM CITRATE/VITAMIN D PO Take 1 each by mouth 2 (two) times daily. Vitamin D 500IU with Calcium Citrate 630mg   Chewables    Historical Provider, MD  Fluticasone Furoate-Vilanterol (BREO ELLIPTA) 100-25 MCG/INH AEPB Inhale 1 puff into the lungs daily.     Historical Provider, MD  furosemide (LASIX) 20 MG  tablet Take 1 tablet by mouth  daily Patient taking differently: Take 1 tablet by mouth  daily in the morning 12/11/15   Mary-Margaret Hassell Done, FNP  guaifenesin (MUCUS RELIEF) 400 MG TABS tablet Take 400 mg by mouth every morning.     Historical Provider, MD  HYDROcodone-acetaminophen (NORCO) 10-325 MG tablet Take 1 tablet by mouth 2 (two) times daily. 11/23/15   Mary-Margaret Hassell Done, FNP  levofloxacin (LEVAQUIN) 750 MG tablet Take 1 tablet (750 mg total) by mouth daily. 12/25/15   Erline Hau, MD  metoprolol (LOPRESSOR) 50 MG tablet Take 1 tablet by mouth two  times daily 12/11/15   Mary-Margaret Hassell Done, FNP  tiotropium (SPIRIVA) 18 MCG inhalation capsule Place 1 capsule (18 mcg total) into inhaler and inhale every morning. 06/26/15   Mary-Margaret Hassell Done, FNP  vitamin C (ASCORBIC ACID) 500 MG tablet Take 500 mg by mouth every morning.    Historical Provider, MD    Family History Family History  Problem Relation Age of Onset  . Heart disease Father   . Hyperlipidemia Father   . Hypertension Father   . Heart attack Father   . Cancer Paternal Grandfather   . Alzheimer's disease Mother   . Diabetes Sister   . Hyperlipidemia Sister   . Hypertension Sister   . Heart attack Sister   . Peripheral vascular disease Sister   . Heart disease Brother   . Hyperlipidemia Brother   . Heart attack Brother     Social History Social History  Substance Use Topics  . Smoking status: Former Smoker    Packs/day: 1.00    Years: 35.00    Quit date: 02/15/2009  . Smokeless tobacco: Never Used  . Alcohol use 0.0 oz/week     Comment: hx of abuse and withdrawal     Allergies   Celexa [citalopram hydrobromide]; Nickel; and Tramadol hcl   Review of Systems Review of Systems  Unable to perform ROS: Mental status change     Physical Exam Updated Vital Signs BP 97/84   Pulse 96   Temp 100.3 F (37.9 C) (Rectal)   Resp 25   Ht 5\' 2"  (1.575 m)   Wt 82 lb (37.2 kg)   SpO2 (!) 89%    BMI 15.00 kg/m    Patient Vitals for the past 24 hrs:  BP Temp Temp src Pulse Resp SpO2 Height Weight  12/29/15 2042 - - - - - - - 82 lb (37.2 kg)  12/29/15 2030 - - - 96 25 (!) 89 % - -  12/29/15 2016 - - - - - - 5\' 2"  (1.575 m) -  12/29/15 2015 97/84 - - 88 11 100 % - -  12/29/15 2000 102/74 - - - 19 - - -  12/29/15 1945 - - - - - 100 % - -  12/29/15 1940 - - - - - 100 % - -  12/29/15 1930 97/75 - - - - - - -  12/29/15 1914 125/84 - - 88 - 100 % - -  12/29/15 1901 (!) 85/40 100.3 F (37.9 C) Rectal - - - - -  12/29/15 1857 119/90 - - 104 (!) 28 - - -    Physical Exam 1900: Physical examination:  Nursing notes reviewed; Vital signs and O2 SAT reviewed;  Constitutional: Thin, frail. Moaning; Head:  Normocephalic, atraumatic; Eyes: EOMI, PERRL, No scleral icterus; ENMT: Mouth and pharynx normal, Mucous membranes dry; Neck: Supple, Full range of motion, No lymphadenopathy; Cardiovascular: Regular rate and rhythm, No gallop; Respiratory: Breath sounds diminished & equal bilaterally, faint scattered wheezes. No audible wheezing, Normal respiratory effort/excursion; Chest: Nontender, Movement normal; Abdomen: Soft, Nontender, Nondistended, Normal bowel sounds; Genitourinary: No CVA tenderness; Extremities: Pulses normal, No tenderness, No edema, No calf edema or asymmetry.; Neuro: Moaning, laying eyes closed. No facial droop. Moves all extremities spontaneously..; Skin: Color normal, Warm, Dry.   ED Treatments / Results  Labs (all labs ordered are listed, but only abnormal results are displayed)   EKG  EKG Interpretation  Date/Time:  Friday December 29 2015 20:18:00 EDT Ventricular Rate:  91 PR Interval:    QRS Duration: 89 QT Interval:  432 QTC Calculation: 532 R Axis:   67 Text Interpretation:  Sinus rhythm Borderline T wave abnormalities Prolonged QT interval Baseline wander When compared with ECG of 12/19/2015 QT has lengthened Confirmed by [facility]  MD, Nunzio Cory [phone]) on  12/29/2015 9:27:37 PM       Radiology   Procedures Procedures (including critical care time)  Airway procedure:  Timeout: Pre-procedure timeout not performed due to emergent nature of procedure; Indication: Unresponsive;  Oxygen Saturation: 100 %; Oxygen concentration: 100 %; Preoxygenation: Bag-valve-mask;  Medication: Etomidate; Procedure: Suctioning, RSI, Glidescope laryngoscopy, Endotracheal intubation with 7.67mm cuffed endotracheal tube, Bag-valve-tube ventilation, Mechanical ventilation;  Reassessment: Successful intubation, No bleeding or obvious trauma in oral cavity or posterior pharynx. Teeth intact, without obvious trauma. Breath sounds equal bilaterally, No breath sounds heard over stomach, Chest movement symmetrical, CO2 detector color change, Endotracheal tube fogging, Oxygen saturation normal. Post-procedure xray obtained.       Medications Ordered in ED Medications  etomidate (AMIDATE) injection (10 mg Intravenous Given 12/29/15 1915)  propofol (DIPRIVAN) 1000 MG/100ML infusion (not administered)  0.9 %  sodium chloride infusion ( Intravenous New Bag/Given 12/29/15 1950)  magnesium sulfate IVPB 2 g 50 mL (2 g Intravenous New Bag/Given 12/29/15 2021)  sodium chloride 0.9 % bolus 1,000 mL (not administered)    And  sodium chloride 0.9 % bolus 250 mL (not administered)  piperacillin-tazobactam (ZOSYN) IVPB 3.375 g (not administered)  vancomycin (VANCOCIN) IVPB 1000 mg/200 mL premix (not administered)  piperacillin-tazobactam (ZOSYN) IVPB 3.375 g (not administered)  vancomycin (VANCOCIN) IVPB 750 mg/150 ml premix (not administered)  sodium chloride 0.9 % bolus 1,000 mL (0 mLs Intravenous Stopped 12/29/15 2010)  propofol (DIPRIVAN) 1000 MG/100ML infusion (5 mcg/kg/min  37.7 kg Intravenous New Bag/Given 12/29/15 1921)  ipratropium-albuterol (DUONEB) 0.5-2.5 (3) MG/3ML nebulizer solution (3 mLs  Given 12/29/15 1945)  albuterol (PROVENTIL) (2.5 MG/3ML) 0.083%  nebulizer solution (2.5 mg  Given 12/29/15 1945)     Initial Impression / Assessment and Plan / ED Course  I have reviewed the triage vital signs and the nursing notes.  Pertinent labs & imaging results that were available during my care of the patient were reviewed by me and considered in my medical decision making (see chart for details).  MDM Reviewed:  previous chart, nursing note and vitals Reviewed previous: labs and ECG Interpretation: labs, ECG, x-ray and CT scan Total time providing critical care: 30-74 minutes. This excludes time spent performing separately reportable procedures and services. Consults: critical care   CRITICAL CARE Performed by: Alfonzo Feller Total critical care time: 45 minutes Critical care time was exclusive of separately billable procedures and treating other patients. Critical care was necessary to treat or prevent imminent or life-threatening deterioration. Critical care was time spent personally by me on the following activities: development of treatment plan with patient and/or surrogate as well as nursing, discussions with consultants, evaluation of patient's response to treatment, examination of patient, obtaining history from patient or surrogate, ordering and performing treatments and interventions, ordering and review of laboratory studies, ordering and review of radiographic studies, pulse oximetry and re-evaluation of patient's condition.   Results for orders placed or performed during the hospital encounter of 12/29/15  Culture, blood (routine x 2)  Result Value Ref Range   Specimen Description BLOOD LEFT ARM    Special Requests BOTTLES DRAWN AEROBIC AND ANAEROBIC 5CC EACH    Culture PENDING    Report Status PENDING   Culture, blood (routine x 2)  Result Value Ref Range   Specimen Description BLOOD RIGHT HAND    Special Requests      BOTTLES DRAWN AEROBIC AND ANAEROBIC AER 6CC ANA 4CC   Culture PENDING    Report Status PENDING     Urinalysis, Routine w reflex microscopic  Result Value Ref Range   Color, Urine YELLOW YELLOW   APPearance CLEAR CLEAR   Specific Gravity, Urine 1.010 1.005 - 1.030   pH 6.5 5.0 - 8.0   Glucose, UA NEGATIVE NEGATIVE mg/dL   Hgb urine dipstick NEGATIVE NEGATIVE   Bilirubin Urine NEGATIVE NEGATIVE   Ketones, ur NEGATIVE NEGATIVE mg/dL   Protein, ur NEGATIVE NEGATIVE mg/dL   Nitrite NEGATIVE NEGATIVE   Leukocytes, UA NEGATIVE NEGATIVE  CBC with Differential  Result Value Ref Range   WBC 20.9 (H) 4.0 - 10.5 K/uL   RBC 2.76 (L) 3.87 - 5.11 MIL/uL   Hemoglobin 7.8 (L) 12.0 - 15.0 g/dL   HCT 26.5 (L) 36.0 - 46.0 %   MCV 96.0 78.0 - 100.0 fL   MCH 28.3 26.0 - 34.0 pg   MCHC 29.4 (L) 30.0 - 36.0 g/dL   RDW 14.8 11.5 - 15.5 %   Platelets 416 (H) 150 - 400 K/uL   Neutrophils Relative % 74 %   Neutro Abs 15.5 (H) 1.7 - 7.7 K/uL   Lymphocytes Relative 16 %   Lymphs Abs 3.4 0.7 - 4.0 K/uL   Monocytes Relative 10 %   Monocytes Absolute 2.0 (H) 0.1 - 1.0 K/uL   Eosinophils Relative 0 %   Eosinophils Absolute 0.0 0.0 - 0.7 K/uL   Basophils Relative 0 %   Basophils Absolute 0.0 0.0 - 0.1 K/uL  Comprehensive metabolic panel  Result Value Ref Range   Sodium 136 135 - 145 mmol/L   Potassium 3.5 3.5 - 5.1 mmol/L   Chloride 85 (L) 101 - 111 mmol/L   CO2 41 (H) 22 - 32 mmol/L   Glucose, Bld 93 65 - 99 mg/dL   BUN 12 6 - 20 mg/dL   Creatinine, Ser 1.03 (H) 0.44 - 1.00 mg/dL   Calcium 8.1 (L) 8.9 - 10.3 mg/dL   Total Protein 7.0 6.5 - 8.1 g/dL   Albumin 2.4 (L) 3.5 - 5.0 g/dL   AST 20 15 -  41 U/L   ALT 14 14 - 54 U/L   Alkaline Phosphatase 79 38 - 126 U/L   Total Bilirubin 0.5 0.3 - 1.2 mg/dL   GFR calc non Af Amer 53 (L) >60 mL/min   GFR calc Af Amer >60 >60 mL/min   Anion gap 10 5 - 15  Magnesium  Result Value Ref Range   Magnesium 0.9 (LL) 1.7 - 2.4 mg/dL  Brain natriuretic peptide  Result Value Ref Range   B Natriuretic Peptide 222.0 (H) 0.0 - 100.0 pg/mL  Blood gas, arterial   Result Value Ref Range   FIO2 60.00    Delivery systems VENTILATOR    Mode PRESSURE REGULATED VOLUME CONTROL    VT 420 mL   LHR 14 resp/min   Peep/cpap 5.0 cm H20   pH, Arterial 7.351 7.350 - 7.450   pCO2 arterial 65.8 (HH) 32.0 - 48.0 mmHg   pO2, Arterial 245 (H) 83.0 - 108.0 mmHg   Bicarbonate 33.2 (H) 20.0 - 28.0 mmol/L   Acid-Base Excess 9.8 (H) 0.0 - 2.0 mmol/L   O2 Saturation 99.4 %   Patient temperature 37.0    Collection site RIGHT RADIAL    Drawn by [id number]    Sample type ARTERIAL DRAW    Allens test (pass/fail) POSITIVE (A) PASS  CBG monitoring, ED  Result Value Ref Range   Glucose-Capillary 95 65 - 99 mg/dL  I-stat chem 8, ed  Result Value Ref Range   Sodium 136 135 - 145 mmol/L   Potassium 3.4 (L) 3.5 - 5.1 mmol/L   Chloride 81 (L) 101 - 111 mmol/L   BUN 11 6 - 20 mg/dL   Creatinine, Ser 1.00 0.44 - 1.00 mg/dL   Glucose, Bld 91 65 - 99 mg/dL   Calcium, Ion 0.98 (L) 1.15 - 1.40 mmol/L   TCO2 40 0 - 100 mmol/L   Hemoglobin 9.2 (L) 12.0 - 15.0 g/dL   HCT 27.0 (L) 36.0 - 46.0 %  I-Stat CG4 Lactic Acid, ED  Result Value Ref Range   Lactic Acid, Venous 2.59 (HH) 0.5 - 1.9 mmol/L   Comment NOTIFIED PHYSICIAN   I-stat troponin, ED  Result Value Ref Range   Troponin i, poc 0.00 0.00 - 0.08 ng/mL   Comment 3           Ct Head Wo Contrast Result Date: 12/29/2015 CLINICAL DATA:  Altered mental status EXAM: CT HEAD WITHOUT CONTRAST TECHNIQUE: Contiguous axial images were obtained from the base of the skull through the vertex without intravenous contrast. COMPARISON:  CT head 11/27/2015 FINDINGS: Brain: Image quality degraded by mild motion. Generalized atrophy stable. Chronic microvascular ischemic change the white matter, stable. Negative for acute infarct. Negative for acute hemorrhage or mass. No shift of the midline structures. Vascular: No hyperdense vessel or unexpected calcification. Skull: Negative Sinuses/Orbits: Negative Other: None IMPRESSION: No acute  abnormality. Atrophy and chronic microvascular ischemic changes are stable. Electronically Signed   By: Franchot Gallo M.D.   On: 12/29/2015 19:42   Dg Chest Port 1 View Result Date: 12/29/2015 CLINICAL DATA:  Status post intubation. EXAM: PORTABLE CHEST 1 VIEW COMPARISON:  12/21/2015. FINDINGS: 1915 hours. Endotracheal tube tip is approximately 5 cm above the base of the carina. Bilateral upper lobe scarring again noted. Hyperexpansion and diffuse underlying chronic interstitial lung disease, as before. Small bilateral pleural effusions noted. The cardiopericardial silhouette is within normal limits for size. Defibrillator pads overlie the lower chest. Telemetry leads overlie the chest. IMPRESSION: 1. Status  post intubation with improved aeration at the lung bases. 2. Extensive parenchymal scarring in both upper lobes as before with underlying diffuse interstitial chronic lung disease. 3. Small bilateral pleural effusions. Electronically Signed   By: Misty Stanley M.D.   On: 12/29/2015 19:43    1915:  On pt arrival to ED: pt with AMS, moaning, eyes intermittently opening, pulling away from ED staff as they try to start IV, take temp, etc. Pt then was noted to have generalized tonic-clonic seizure lasting several seconds before spontaneously resolving, +incont urine. Pt's HR, BP and O2 Sats dropped after seizure episode. IVF NS bolus and BVM O2 initiated with improvement in VS. Pt intubated without incident. Short neb given. IVF continues. Workup ordered.  2045:  Pt sedated on IV propofol. IV magnesium started. IV abx and IVF continue.   2120:  Pt with intermittent low BP's, which respond to IVF and decreasing rate of propofol. Pt moves around on stretcher when propofol decreased. Dx and testing d/w pt's family.  Questions answered.  Verb understanding, agreeable to admit. T/C to PCCM Dr. Elsworth Soho, case discussed, including:  HPI, pertinent PM/SHx, VS/PE, dx testing, ED course and treatment:  Agreeable to  accept transfer/admit, requests to dose IV mag 6 grams total, d/c propofol and dose intermittent versed/fentanyl, write temporary orders, obtain ICU bed to Dr. Ammie Dalton service.     Final Clinical Impressions(s) / ED Diagnoses   Final diagnoses:  None    New Prescriptions New Prescriptions   No medications on file     Francine Graven, DO 01/02/16 1934

## 2015-12-29 NOTE — ED Triage Notes (Signed)
Pt released from the hospital on Monday, treated for pneumonia. Family reports pt started having mental status changes last night. Per family pt usually speaks and is oriented.

## 2015-12-29 NOTE — Progress Notes (Signed)
Pharmacy Antibiotic Note  [name] is a 70 y.o. female admitted on 12/29/2015 with sepsis.  Pharmacy has been consulted for vancomycin and zosyn dosing. Initial doses ordered in the ED  Plan: Cont zosyn 3.375 gm IV q8 hours Cont vancomycin 750 mg IV q24 hours F/u renal function, cultures and clinical course  Height: 5\' 2"  (157.5 cm) Weight: 82 lb (37.2 kg) IBW/kg (Calculated) : 50.1  Temp (24hrs), Avg:100.3 F (37.9 C), Min:100.3 F (37.9 C), Max:100.3 F (37.9 C)   Recent Labs Lab 12/23/15 0612 12/25/15 0628 12/29/15 1905 12/29/15 1913 12/29/15 1918  WBC 9.4 12.8* 20.9*  --   --   CREATININE 0.72 0.82 1.03* 1.00  --   LATICACIDVEN  --   --   --   --  2.59*    Estimated Creatinine Clearance: 30.3 mL/min (by C-G formula based on SCr of 1 mg/dL).    Allergies  Allergen Reactions  . Celexa [Citalopram Hydrobromide]     Jerky movements  . Nickel   . Tramadol Hcl     REACTION: nausea and vomiting    Antimicrobials this admission: vanc 10/13 >>  zosyn 10/13 >>    Thank you for allowing pharmacy to be a part of this patient's care.  Excell Seltzer Poteet 12/29/2015 8:46 PM

## 2015-12-29 NOTE — ED Notes (Signed)
Pt intubated at this time.  21 at the teeth. Positive color change, positive breath sounds.

## 2015-12-30 ENCOUNTER — Inpatient Hospital Stay (HOSPITAL_COMMUNITY): Payer: Medicare Other

## 2015-12-30 DIAGNOSIS — Z9981 Dependence on supplemental oxygen: Secondary | ICD-10-CM | POA: Diagnosis not present

## 2015-12-30 DIAGNOSIS — L8990 Pressure ulcer of unspecified site, unspecified stage: Secondary | ICD-10-CM | POA: Insufficient documentation

## 2015-12-30 DIAGNOSIS — R569 Unspecified convulsions: Secondary | ICD-10-CM

## 2015-12-30 DIAGNOSIS — J440 Chronic obstructive pulmonary disease with acute lower respiratory infection: Secondary | ICD-10-CM | POA: Diagnosis not present

## 2015-12-30 DIAGNOSIS — J15212 Pneumonia due to Methicillin resistant Staphylococcus aureus: Secondary | ICD-10-CM | POA: Diagnosis not present

## 2015-12-30 DIAGNOSIS — E872 Acidosis: Secondary | ICD-10-CM | POA: Diagnosis not present

## 2015-12-30 DIAGNOSIS — G92 Toxic encephalopathy: Secondary | ICD-10-CM | POA: Diagnosis not present

## 2015-12-30 DIAGNOSIS — I110 Hypertensive heart disease with heart failure: Secondary | ICD-10-CM | POA: Diagnosis not present

## 2015-12-30 DIAGNOSIS — J9602 Acute respiratory failure with hypercapnia: Secondary | ICD-10-CM

## 2015-12-30 LAB — HEPATIC FUNCTION PANEL
ALT: 12 U/L — AB (ref 14–54)
AST: 16 U/L (ref 15–41)
Albumin: 1.8 g/dL — ABNORMAL LOW (ref 3.5–5.0)
Alkaline Phosphatase: 61 U/L (ref 38–126)
BILIRUBIN DIRECT: 0.2 mg/dL (ref 0.1–0.5)
BILIRUBIN INDIRECT: 0.4 mg/dL (ref 0.3–0.9)
TOTAL PROTEIN: 5.5 g/dL — AB (ref 6.5–8.1)
Total Bilirubin: 0.6 mg/dL (ref 0.3–1.2)

## 2015-12-30 LAB — PREPARE RBC (CROSSMATCH)

## 2015-12-30 LAB — CBC
HCT: 20.6 % — ABNORMAL LOW (ref 36.0–46.0)
HEMOGLOBIN: 6.2 g/dL — AB (ref 12.0–15.0)
MCH: 28.1 pg (ref 26.0–34.0)
MCHC: 30.1 g/dL (ref 30.0–36.0)
MCV: 93.2 fL (ref 78.0–100.0)
Platelets: 330 10*3/uL (ref 150–400)
RBC: 2.21 MIL/uL — AB (ref 3.87–5.11)
RDW: 15.2 % (ref 11.5–15.5)
WBC: 10.4 10*3/uL (ref 4.0–10.5)

## 2015-12-30 LAB — BLOOD GAS, ARTERIAL
Acid-Base Excess: 10.3 mmol/L — ABNORMAL HIGH (ref 0.0–2.0)
Bicarbonate: 34.4 mmol/L — ABNORMAL HIGH (ref 20.0–28.0)
Drawn by: 365271
FIO2: 0.4
O2 SAT: 99.5 %
PEEP/CPAP: 5 cmH2O
PH ART: 7.495 — AB (ref 7.350–7.450)
Patient temperature: 97
RATE: 18 resp/min
VT: 400 mL
pCO2 arterial: 44.6 mmHg (ref 32.0–48.0)
pO2, Arterial: 147 mmHg — ABNORMAL HIGH (ref 83.0–108.0)

## 2015-12-30 LAB — BASIC METABOLIC PANEL
Anion gap: 8 (ref 5–15)
Anion gap: 8 (ref 5–15)
BUN: 6 mg/dL (ref 6–20)
CHLORIDE: 96 mmol/L — AB (ref 101–111)
CHLORIDE: 100 mmol/L — AB (ref 101–111)
CO2: 34 mmol/L — AB (ref 22–32)
CO2: 30 mmol/L (ref 22–32)
CREATININE: 0.93 mg/dL (ref 0.44–1.00)
Calcium: 7.3 mg/dL — ABNORMAL LOW (ref 8.9–10.3)
Calcium: 7.8 mg/dL — ABNORMAL LOW (ref 8.9–10.3)
Creatinine, Ser: 0.87 mg/dL (ref 0.44–1.00)
GFR calc Af Amer: >60 mL/min (ref >60)
GFR calc non Af Amer: >60 mL/min (ref >60)
GLUCOSE: 132 mg/dL — AB (ref 65–99)
Glucose, Bld: 87 mg/dL (ref 65–99)
POTASSIUM: 2.7 mmol/L — AB (ref 3.5–5.1)
POTASSIUM: 4.1 mmol/L (ref 3.5–5.1)
Sodium: 138 mmol/L (ref 135–145)
Sodium: 138 mmol/L (ref 135–145)

## 2015-12-30 LAB — ABO/RH: ABO/RH(D): A NEG

## 2015-12-30 LAB — LACTIC ACID, PLASMA
LACTIC ACID, VENOUS: 0.8 mmol/L (ref 0.5–1.9)
Lactic Acid, Venous: 1 mmol/L (ref 0.5–1.9)

## 2015-12-30 LAB — HEMOGLOBIN AND HEMATOCRIT, BLOOD
HCT: 28.3 % — ABNORMAL LOW (ref 36.0–46.0)
Hemoglobin: 8.6 g/dL — ABNORMAL LOW (ref 12.0–15.0)

## 2015-12-30 LAB — RAPID URINE DRUG SCREEN, HOSP PERFORMED
Amphetamines: NOT DETECTED
Barbiturates: NOT DETECTED
Benzodiazepines: POSITIVE — AB
COCAINE: NOT DETECTED
OPIATES: NOT DETECTED
TETRAHYDROCANNABINOL: NOT DETECTED

## 2015-12-30 LAB — TROPONIN I: Troponin I: <0.03 ng/mL (ref <0.03)

## 2015-12-30 LAB — PHOSPHORUS: PHOSPHORUS: 1.7 mg/dL — AB (ref 2.5–4.6)

## 2015-12-30 LAB — PROCALCITONIN

## 2015-12-30 LAB — MAGNESIUM: Magnesium: 2.6 mg/dL — ABNORMAL HIGH (ref 1.7–2.4)

## 2015-12-30 LAB — GLUCOSE, CAPILLARY: Glucose-Capillary: 132 mg/dL — ABNORMAL HIGH (ref 65–99)

## 2015-12-30 LAB — TSH: TSH: 1.743 u[IU]/mL (ref 0.350–4.500)

## 2015-12-30 MED ORDER — FAMOTIDINE 20 MG PO TABS
20.0000 mg | ORAL_TABLET | Freq: Every day | ORAL | Status: DC
  Administered 2015-12-30 – 2016-01-01 (×3): 20 mg
  Filled 2015-12-30 (×4): qty 1

## 2015-12-30 MED ORDER — CHLORHEXIDINE GLUCONATE 0.12% ORAL RINSE (MEDLINE KIT)
15.0000 mL | Freq: Two times a day (BID) | OROMUCOSAL | Status: DC
  Administered 2015-12-30: 15 mL via OROMUCOSAL

## 2015-12-30 MED ORDER — MIDAZOLAM HCL 2 MG/2ML IJ SOLN: 1.0000 mg | INTRAMUSCULAR | Status: DC | PRN

## 2015-12-30 MED ORDER — SODIUM CHLORIDE 0.9 % IV BOLUS (SEPSIS)
500.0000 mL | Freq: Once | INTRAVENOUS | Status: AC
  Administered 2015-12-30: 500 mL via INTRAVENOUS

## 2015-12-30 MED ORDER — VITAL HIGH PROTEIN PO LIQD: 1000.0000 mL | ORAL | Status: DC

## 2015-12-30 MED ORDER — IPRATROPIUM-ALBUTEROL 0.5-2.5 (3) MG/3ML IN SOLN
3.0000 mL | Freq: Four times a day (QID) | RESPIRATORY_TRACT | Status: DC
  Administered 2015-12-30 – 2016-01-03 (×16): 3 mL via RESPIRATORY_TRACT
  Filled 2015-12-30 (×16): qty 3

## 2015-12-30 MED ORDER — ALBUTEROL SULFATE (2.5 MG/3ML) 0.083% IN NEBU
2.5000 mg | INHALATION_SOLUTION | RESPIRATORY_TRACT | Status: DC
  Administered 2015-12-30: 2.5 mg via RESPIRATORY_TRACT
  Filled 2015-12-30: qty 3

## 2015-12-30 MED ORDER — FENTANYL 2500MCG IN NS 250ML (10MCG/ML) PREMIX INFUSION
25.0000 ug/h | INTRAVENOUS | Status: DC
  Administered 2015-12-30: 50 ug/h via INTRAVENOUS
  Administered 2015-12-31: 75 ug/h via INTRAVENOUS
  Administered 2015-12-31 – 2016-01-01 (×3): 50 ug/h via INTRAVENOUS
  Filled 2015-12-30 (×2): qty 250

## 2015-12-30 MED ORDER — POTASSIUM CHLORIDE 20 MEQ/15ML (10%) PO SOLN
40.0000 meq | Freq: Two times a day (BID) | ORAL | Status: AC
  Administered 2015-12-30 (×2): 40 meq via ORAL
  Filled 2015-12-30 (×3): qty 30

## 2015-12-30 MED ORDER — SODIUM CHLORIDE 0.9 % IV SOLN
25.0000 ug/h | INTRAVENOUS | Status: DC
  Filled 2015-12-30: qty 50

## 2015-12-30 MED ORDER — FAMOTIDINE 20 MG PO TABS
20.0000 mg | ORAL_TABLET | Freq: Two times a day (BID) | ORAL | Status: DC
  Filled 2015-12-30: qty 1

## 2015-12-30 MED ORDER — POTASSIUM CHLORIDE 10 MEQ/100ML IV SOLN
10.0000 meq | INTRAVENOUS | Status: AC
  Administered 2015-12-30: 10 meq via INTRAVENOUS

## 2015-12-30 MED ORDER — SODIUM CHLORIDE 0.9 % IV SOLN
INTRAVENOUS | Status: DC
  Administered 2015-12-30: 05:00:00 via INTRAVENOUS
  Administered 2015-12-30: 75 mL/h via INTRAVENOUS
  Administered 2015-12-31 – 2016-01-02 (×3): via INTRAVENOUS

## 2015-12-30 MED ORDER — ENOXAPARIN SODIUM 30 MG/0.3ML ~~LOC~~ SOLN
20.0000 mg | SUBCUTANEOUS | Status: DC
  Administered 2015-12-30: 20 mg via SUBCUTANEOUS
  Filled 2015-12-30 (×2): qty 0.2

## 2015-12-30 MED ORDER — ORAL CARE MOUTH RINSE
15.0000 mL | Freq: Four times a day (QID) | OROMUCOSAL | Status: DC
  Administered 2015-12-30 – 2016-01-03 (×16): 15 mL via OROMUCOSAL

## 2015-12-30 MED ORDER — SODIUM CHLORIDE 0.9 % IV SOLN
250.0000 mL | INTRAVENOUS | Status: DC | PRN
  Administered 2015-12-30: 250 mL via INTRAVENOUS

## 2015-12-30 MED ORDER — SODIUM CHLORIDE 0.9 % IV SOLN
INTRAVENOUS | Status: AC
  Administered 2015-12-30: 100 mL/h via INTRAVENOUS

## 2015-12-30 MED ORDER — MIDAZOLAM HCL 2 MG/2ML IJ SOLN
1.0000 mg | INTRAMUSCULAR | Status: DC | PRN
  Administered 2015-12-30 – 2016-01-01 (×6): 2 mg via INTRAVENOUS
  Administered 2016-01-01: 4 mg via INTRAVENOUS
  Administered 2016-01-01: 2 mg via INTRAVENOUS
  Administered 2016-01-01: 4 mg via INTRAVENOUS
  Administered 2016-01-02 (×2): 2 mg via INTRAVENOUS
  Filled 2015-12-30: qty 4
  Filled 2015-12-30 (×5): qty 2
  Filled 2015-12-30: qty 4
  Filled 2015-12-30: qty 2
  Filled 2015-12-30: qty 4
  Filled 2015-12-30 (×3): qty 2

## 2015-12-30 MED ORDER — METHYLPREDNISOLONE SODIUM SUCC 40 MG IJ SOLR
40.0000 mg | Freq: Four times a day (QID) | INTRAMUSCULAR | Status: DC
  Administered 2015-12-30 – 2016-01-02 (×13): 40 mg via INTRAVENOUS
  Filled 2015-12-30 (×14): qty 1

## 2015-12-30 MED ORDER — FENTANYL CITRATE (PF) 100 MCG/2ML IJ SOLN: 25.0000 ug | INTRAMUSCULAR | Status: DC | PRN

## 2015-12-30 MED ORDER — CHLORHEXIDINE GLUCONATE 0.12% ORAL RINSE (MEDLINE KIT)
15.0000 mL | Freq: Two times a day (BID) | OROMUCOSAL | Status: DC
  Administered 2015-12-30 – 2016-01-02 (×8): 15 mL via OROMUCOSAL

## 2015-12-30 MED ORDER — FENTANYL CITRATE (PF) 100 MCG/2ML IJ SOLN: 50.0000 ug | INTRAMUSCULAR | Status: DC | PRN

## 2015-12-30 MED ORDER — POTASSIUM CHLORIDE 10 MEQ/100ML IV SOLN
10.0000 meq | INTRAVENOUS | Status: DC
  Administered 2015-12-30: 10 meq via INTRAVENOUS
  Filled 2015-12-30 (×3): qty 100

## 2015-12-30 MED ORDER — SODIUM CHLORIDE 0.9 % IV SOLN: Freq: Once | INTRAVENOUS | Status: AC

## 2015-12-30 MED ORDER — ORAL CARE MOUTH RINSE: 15.0000 mL | Freq: Four times a day (QID) | OROMUCOSAL | Status: DC

## 2015-12-30 MED ORDER — VITAL AF 1.2 CAL PO LIQD
1000.0000 mL | ORAL | Status: DC
  Administered 2015-12-30 – 2015-12-31 (×2): 1000 mL

## 2015-12-30 MED ORDER — FENTANYL CITRATE (PF) 2500 MCG/50ML IJ SOLN
INTRAMUSCULAR | Status: AC
  Filled 2015-12-30: qty 50

## 2015-12-30 NOTE — Progress Notes (Addendum)
Initial Nutrition Assessment  DOCUMENTATION CODES:  Underweight, Severe malnutrition in context of chronic illness   Pt meets criteria for SEVERE MALNUTRITION in the context of Chronic Illness as evidenced by a PO intake that met < or equal to 75% of needs for > or equal to 1 month and a weight loss of >7.5% in 3 months.  INTERVENTION:   Addendum: C/S for TF acknowledged: implement following recs  If unable to extubate, recommend beginning TF via OGT with Vital AF 1.2  at goal rate of 40 ml/h (960 ml per day) and to provide 1152 kcals, 72 gm protein,779 ml free water daily.  NUTRITION DIAGNOSIS:  Inadequate oral intake related to inability to eat as evidenced by NPO status.  GOAL:  Patient will meet greater than or equal to 90% of their needs  MONITOR:  Diet advancement, Vent status, Labs  REASON FOR ASSESSMENT:  Ventilator    ASSESSMENT:  70 y/o female PMHx anxiety, afib, HTN, Chronic respiratory failure, COPD on home 02, CHF. Was recently admitted and discharged on 10/9 from [facility] for PNA. Represents with AMS and jerking movement. Intubated on arrival.   Pt is intubated.  She was seen by this RD 10 days ago at which time she had reported a chronic loss of appetite. She would essentially not eating meals and would rather just graze throughout the day. She had noted she would eat more if she had assistance. She was drinking 1 Ensure/day at that time.   At that time, she was 85 lbs and had lost 7.4% bw in 2 months. She has lost another 3 lbs since that encounter.   Patient is currently intubated on ventilator support MV: 7.5 L/min Temp (24hrs), Avg:98 F (36.7 C), Min:96.6 F (35.9 C), Max:100.3 F (37.9 C)  Propofol: none  NFPE: Severe muscle wasting Lower Body. Mild temporal muscle loss.   Labs: Hypokalemia, Albumin: 1.8, Low phos, high Mag.  Medications: IVF, Pepcid, Methylprednisone, zosyn, kcl, IV abx.   Recent Labs Lab 12/25/15 0628 12/29/15 1905 12/29/15 1913  12/30/15 0450  NA 138 136 136 138  K 3.8 3.5 3.4* 2.7*  CL 84* 85* 81* 96*  CO2 45* 41*  --  34*  BUN 24* '12 11 6  '$ CREATININE 0.82 1.03* 1.00 0.93  CALCIUM 9.0 8.1*  --  7.3*  MG  --  0.9*  --  2.6*  PHOS  --   --   --  1.7*  GLUCOSE 118* 93 91 87   Diet Order:  Diet NPO time specified  Skin:  MSAD to Groin, Buttocks, Sacrum. Multiple skin tears +abrasions to legs. Stage 1 ulcer to sacrum, Unstageable to heel.   Last BM:  Unknown  Height:  Ht Readings from Last 1 Encounters:  12/29/15 '5\' 2"'$  (1.575 m)   Weight:  Wt Readings from Last 1 Encounters:  12/30/15 87 lb 4.8 oz (39.6 kg)   Wt Readings from Last 10 Encounters:  12/30/15 87 lb 4.8 oz (39.6 kg)  12/25/15 83 lb 1.8 oz (37.7 kg)  11/08/15 85 lb (38.6 kg)  10/25/15 90 lb 8 oz (41.1 kg)  12/23/14 101 lb (45.8 kg)  11/11/14 102 lb (46.3 kg)  06/09/14 110 lb (49.9 kg)  05/25/14 110 lb (49.9 kg)  04/14/14 111 lb (50.3 kg)  04/07/14 112 lb (50.8 kg)  Admitted at 82 lbs (37.27 kg)   Ideal Body Weight:  50 kg  BMI:  Body mass index is 15.97 kg/m.  Estimated Nutritional Needs:  Kcal:  1095 kcals Protein:  55-75 (1.5-2 g/kg bw) Fluid:  Per MD  EDUCATION NEEDS:  No education needs identified at this time  Burtis Junes RD, LDN, [place] Pager: [phone] 12/30/2015 10:18 AM

## 2015-12-30 NOTE — Progress Notes (Signed)
CRITICAL VALUE ALERT  Critical value received:  Hb 6.2  Date of notification:  12/30/2014  Time of notification:  [id number]  Critical value read back:Yes.    Nurse who received alert:  Jannifer Franklin, RN  MD notified (1st page):    Time of first page:    MD notified (2nd page):  Time of second page:  Responding MD:  Dr. Dallas Schimke  Time MD responded:  [phone]

## 2015-12-30 NOTE — Progress Notes (Signed)
CRITICAL VALUE ALERT  Critical value received:  K+ 2.7  Date of notification:  12/30/2015  Time of notification:  0605  Critical value read back: Yes  Nurse who received alert:  Lonn Georgia, RN  MD notified (1st page):   Dr.Gunadasa  Time of first page:   MD notified (2nd page):  Time of second page:  Responding MD:  Dr.Gunadasa  Time MD responded:  [phone]

## 2015-12-30 NOTE — Progress Notes (Signed)
CRITICAL VALUE ALERT  Critical value received:  Hgb 6.2  Date of notification:  12/30/15  Time of notification: 0711  Critical value read back: yes  Nurse who received alert:  LBass  MD notified (1st page):  Dr Ashok Cordia, CCM notified earlier om night shift  Time of first page:  0930  MD notified (2nd page):  Time of second page:  Responding MD: Dr Ashok Cordia  Time MD responded:  CCM already aware and orders received

## 2015-12-30 NOTE — Progress Notes (Signed)
PULMONARY / CRITICAL CARE MEDICINE   Name: [name] MRN: [id number] DOB: Aug 14, 1945    ADMISSION DATE:  12/29/2015 CONSULTATION DATE:  10/13  REFERRING MD:  Forestine Na  CHIEF COMPLAINT:  AMS/  HISTORY OF PRESENT ILLNESS:   [name] is a 70 yo female with PMH of COPD, hx of rheumatic fever, HTN, HLD, RA, PVD, Diastolic HF (EF 04-59% X7FS in 2015), protein calorie malnutrition who presents with lethargy and AMS. She was recently hospitalized (10/3-10/9) for HCAP, mild COPD exacerbation and Acute Hypoxemic Respiratory Failure. She was discharged with Levaquin PO for 5 more days.   Her family reports she was not her baseline at the time of discharge on 10/9. She was "not coherent", she would babble and have hallucinations (talking to deceased relatives). This has been worsening over the past few days. Over the past two days, family member reports she was having "seizures"; she describes these episodes as one strong "jerk" with a couple of "sporadic ones" which would last a few seconds. No associated urinary or bowel incontinence. No prior history of seizure like activity. Would have multiple episodes as frequent as 30 mins to 1 hour. Reports seizures were more frequent today. She also reports she has been having many falls. At baseline, patient is able to have a conversation appropriately. Family members denies patient having fevers. She does use home oxygen of 3.5 L (recently increased from 2.5L from hospital admission).   In the ED, it seems that she may have had sudden loss of consciousness when she came to [facility], and she was intubated. Her CXR showed extensive parenchymal scarring in both upper lobes, small bilateral pleural effusions. CT head was negative for acute process. EKG sinus rhythm with prolonged QTc 532. Labs significant for leukocytosis 20.9. Cr 1.00. Ionized Ca 0.98, Magnesium 0.9, BNP 222, Lactic Acid 2.59. ABG with hypercarbia with normal pH. She was given 2L bolus,  Magnesium, started on Vanc/Zosyn, and duoneb. Patient was transferred to [facility] for further care.   SUBJECTIVE:  No acute events since admission. Still having tremors intermittently.  REVIEW OF SYSTEMS:  Unable to obtain as patient is intubated.   VITAL SIGNS: BP 113/60   Pulse 92   Temp 99 F (37.2 C)   Resp 18   Ht _0  (1.575 m)   Wt 87 lb 4.8 oz (39.6 kg)   SpO2 98%   BMI 15.97 kg/m   HEMODYNAMICS:    VENTILATOR SETTINGS: Vent Mode: PRVC FiO2 (%):  [40 %-80 %] 40 % Set Rate:  [14 bmp-18 bmp] 18 bmp Vt Set:  [400 mL-420 mL] 400 mL PEEP:  [5 cmH20] 5 cmH20 Plateau Pressure:  [22 cmH20-30 cmH20] 22 cmH20  INTAKE / OUTPUT: I/O last 3 completed shifts: In: 3240.1 [I.V.:2690.1; IV Piggyback:550] Out: 390 [Urine:390]  PHYSICAL EXAMINATION: General:  No acute distress. No family at bedside.  Integument:  Warm & dry. No rash on exposed skin.  HEENT: No scleral injection or icterus. Endotracheal tube in place.  Cardiovascular:  Regular rate. No edema. No appreciable JVD.  Pulmonary:  Coarse breath sounds bilaterally. Symmetric chest wall rise on ventilator. Abdomen: Soft. Normal bowel sounds. Nondistended.  Neurological: Forcibly closes eyes. Spontaneously moving extremities. Intermittent tremor. Pupils symmetric and equal.  LABS:  BMET  Recent Labs Lab 12/25/15 0628 12/29/15 1905 12/29/15 1913 12/30/15 0450  NA 138 136 136 138  K 3.8 3.5 3.4* 2.7*  CL 84* 85* 81* 96*  CO2 45* 41*  --  34*  BUN 24* _0 CREATININE 0.82 1.03* 1.00 0.93  GLUCOSE 118* 93 91 87    Electrolytes  Recent Labs Lab 12/25/15 0628 12/29/15 1905 12/30/15 0450  CALCIUM 9.0 8.1* 7.3*  MG  --  0.9* 2.6*  PHOS  --   --  1.7*    CBC  Recent Labs Lab 12/25/15 0628 12/29/15 1905 12/29/15 1913 12/30/15 0450  WBC 12.8* 20.9*  --  10.4  HGB 8.1* 7.8* 9.2* 6.2*  HCT 28.3* 26.5* 27.0* 20.6*  PLT 368 416*  --  330    Coag's No results for input(s): APTT, INR in the last 168  hours.  Sepsis Markers  Recent Labs Lab 12/29/15 1918 12/30/15 0450 12/30/15 0640  LATICACIDVEN 2.59* 1.0 0.8  PROCALCITON  --  <0.10  --     ABG  Recent Labs Lab 12/29/15 2000 12/30/15 0440  PHART 7.351 7.495*  PCO2ART 65.8* 44.6  PO2ART 245* 147*    Liver Enzymes  Recent Labs Lab 12/29/15 1905 12/30/15 0450  AST 20 16  ALT 14 12*  ALKPHOS 79 61  BILITOT 0.5 0.6  ALBUMIN 2.4* 1.8*    Cardiac Enzymes  Recent Labs Lab 12/30/15 0450  TROPONINI <0.03    Glucose  Recent Labs Lab 12/29/15 1856  GLUCAP 95    Imaging Ct Head Wo Contrast  Result Date: 12/29/2015 CLINICAL DATA:  Altered mental status EXAM: CT HEAD WITHOUT CONTRAST TECHNIQUE: Contiguous axial images were obtained from the base of the skull through the vertex without intravenous contrast. COMPARISON:  CT head 11/27/2015 FINDINGS: Brain: Image quality degraded by mild motion. Generalized atrophy stable. Chronic microvascular ischemic change the white matter, stable. Negative for acute infarct. Negative for acute hemorrhage or mass. No shift of the midline structures. Vascular: No hyperdense vessel or unexpected calcification. Skull: Negative Sinuses/Orbits: Negative Other: None IMPRESSION: No acute abnormality. Atrophy and chronic microvascular ischemic changes are stable. Electronically Signed   By: Franchot Gallo M.D.   On: 12/29/2015 19:42   Dg Chest Port 1 View  Result Date: 12/29/2015 CLINICAL DATA:  Status post intubation. EXAM: PORTABLE CHEST 1 VIEW COMPARISON:  12/21/2015. FINDINGS: 1915 hours. Endotracheal tube tip is approximately 5 cm above the base of the carina. Bilateral upper lobe scarring again noted. Hyperexpansion and diffuse underlying chronic interstitial lung disease, as before. Small bilateral pleural effusions noted. The cardiopericardial silhouette is within normal limits for size. Defibrillator pads overlie the lower chest. Telemetry leads overlie the chest. IMPRESSION: 1.  Status post intubation with improved aeration at the lung bases. 2. Extensive parenchymal scarring in both upper lobes as before with underlying diffuse interstitial chronic lung disease. 3. Small bilateral pleural effusions. Electronically Signed   By: Misty Stanley M.D.   On: 12/29/2015 19:43   Dg Abd Portable 1v  Result Date: 12/30/2015 CLINICAL DATA:  Orogastric tube placement.  Initial encounter. EXAM: PORTABLE ABDOMEN - 1 VIEW COMPARISON:  CT of the abdomen and pelvis from 03/31/2014 FINDINGS: The patient's enteric tube is noted ending overlying the body of the stomach. The visualized bowel gas pattern is unremarkable. Scattered air and stool filled loops of colon are seen; no abnormal dilatation of small bowel loops is seen to suggest small bowel obstruction. No free intra-abdominal air is identified, though evaluation for free air is limited on a single supine view. Mild degenerative change is noted at the lower lumbar spine; the sacroiliac joints are unremarkable in appearance. The patient's right hip arthroplasty is incompletely imaged but appears grossly  unremarkable. Small bilateral pleural effusions are again noted, with increasing right basilar airspace opacification. IMPRESSION: 1. Enteric tube noted ending overlying the body of the stomach. 2. Small bilateral pleural effusions, with increasing right basilar airspace opacification, possibly reflecting pneumonia. Electronically Signed   By: Garald Balding M.D.   On: 12/30/2015 03:01    STUDIES:  CT Head 10/13: no acute abnormality. Atrophy and chronic microvascular ischemic changes stable CXR 10/13: extensive parenchymal scarring in both upper lobes, small bilateral pleural effusions EEG 10/14:  This EEG is consistent with a generalized non-specific cerebral dysfunction(encephalopathy). There was no seizure or seizure predisposition recorded on this study. The tech noted multiple "jerks" and "tremor" which were not well seen on the video,  but there was no seizure or epileptiform activity associated with these movements.   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   P:   Full Vent Support Holding on ABG until stable Solu-Medrol 48m IV q6hr Continuing Duoneb q6hr  CARDIOVASCULAR A:  Shock - Likely hypovolemia. Resolved with IVF. H/O Diastolic CHF  P:  Monitoring on telemetry Vitals per unit protocol  INFECTIOUS A:   SIRS - No clear source of infection.  P:   Empiric Vancomycin & Zosyn Awaiting culture results Checking Respiratory Panel PCR from Tracheal Aspirate Trending Procalcitonin per algorithm  NEUROLOGIC A:   Acute Encephalopathy - Likely due to hypercarbia. Possibly due to toxic metabolic. Sedation on Ventilator Questionable Seizure Activity - Negative EEG.  P:   RASS Goal:  0 to -1 UDS TSH EEG  Consider consulting neurology in the AM Fentanyl PRN for pain Versed PRN for agitation   RENAL A:   Hypokalemia - Replaced. Acute Renal Failure - Resolved. Lactic Acidosis - Resolved. Hypomagnesemia - Resolved. Hypophosphatemia - Mild.  P:   Monitoring UOP with foley Trending electrolytes & renal function daily Replacing electrolytes as indicated KCl 493m VT x2  GASTROINTESTINAL A:   No issues currently   P:   NPO Pepcid VT daily Starting Tube Feedings per dietary recommendations  HEMATOLOGIC A:   Anemia - No signs of active bleeding. Transfused 2u PRBC 10/14. Leukocytosis - Resolved.  P:  Trending cell counts daily w/ CBC SCDs D/C Lovenox  ENDOCRINE A:   No acute issues   P:   Monitor glucose with daily  chemistry  MUSCULOSKELETAL A: Frequent Falls  P: Continuing Soft C-collar Checking CT C-spin w/o contrast   FAMILY  - Updates:   - Inter-disciplinary family meet or Palliative Care meeting due by:  10/21  TODAY'S SUMMARY:  [age].o. female 1 67o female with PMH of COPD, hx of rheumatic fever, HTN, HLD, RA, PVD, Diastolic HF (EF 5546-56%1C1EXn 2015), protein calorie malnutrition who presents with lethargy and AMS found to be in acute on chronic hypercarbic respiratory failure in the setting of what seems like end-stage COPD and required intubation in the ED. She also met SIRS criteria with hypotension and leukocytosis. Hypotension did improve with IV fluids. Checking respiratory viral panel for possible viral pneumonitis. Continuing treatment of COPD/acute hypercarbic respiratory failure. Initiating tube feedings.   I have spent an additional 28 minutes of critical care  time today caring for the patient and reviewing the patient's electronic medical record.   Sonia Baller Ashok Cordia, M.D. [facility] Pager:  [phone] After 3pm or if no response, call [phone] 12/30/2015, 2:01 PM

## 2015-12-30 NOTE — Progress Notes (Signed)
Spoke with daughter, Clydene Laming, regarding patients rings. She and husband will take them upon their arrival.

## 2015-12-30 NOTE — Progress Notes (Signed)
EEG completed, results pending. 

## 2015-12-30 NOTE — Progress Notes (Signed)
4 rings removed from fingers. Will give to family upon their arrival.

## 2015-12-30 NOTE — Procedures (Addendum)
History: 70 yo F with abnormal movements.   Sedation: fentanyl  Technique: This is a 21 channel routine scalp EEG performed at the bedside with bipolar and monopolar montages arranged in accordance to the international 10/20 system of electrode placement. One channel was dedicated to EKG recording.    Background: There is a posterior dominant rhythm of 7.5 Hz That is moderately well sustained. There is also generalized irregular theta and delta slow activity. No clear sleep structures were seen.   Photic stimulation: Physiologic driving is not performed.   EEG Abnormalities: 1) Generalized irregular slow activity.  2) slow PDR  Clinical Interpretation: This EEG is consistent with a generalized non-specific cerebral dysfunction(encephalopathy). There was no seizure or seizure predisposition recorded on this study.   The tech noted multiple "jerks" and "tremor" which were not well seen on the video, but there was no seizure or epileptiform activity associated with these movements.   Roland Rack, MD [facility] [phone]  If 7pm- 7am, please page neurology on call as listed in [place].

## 2015-12-30 NOTE — H&P (Signed)
PULMONARY / CRITICAL CARE MEDICINE   Name: [name] MRN: [id number] DOB: 01-03-1946    ADMISSION DATE:  12/29/2015 CONSULTATION DATE:  10/13  REFERRING MD:  Forestine Na  CHIEF COMPLAINT:  AMS/  HISTORY OF PRESENT ILLNESS:   [name] is a 70 yo female with PMH of COPD, hx of rheumatic fever, HTN, HLD, RA, PVD, Diastolic HF (EF 46-96% E9BM in 2015), protein calorie malnutrition who presents with lethargy and AMS. She was recently hospitalized (10/3-10/9) for HCAP, mild COPD exacerbation and Acute Hypoxemic Respiratory Failure. She was discharged with Levaquin PO for 5 more days.   Her family reports she was not her baseline at the time of discharge on 10/9. She was "not coherent", she would babble and have hallucinations (talking to deceased relatives). This has been worsening over the past few days. Over the past two days, family member reports she was having "seizures"; she describes these episodes as one strong "jerk" with a couple of "sporadic ones" which would last a few seconds. No associated urinary or bowel incontinence. No prior history of seizure like activity. Would have multiple episodes as frequent as 30 mins to 1 hour. Reports seizures were more frequent today. She also reports she has been having many falls. At baseline, patient is able to have a conversation appropriately. Family members denies patient having fevers. She does use home oxygen of 3.5 L (recently increased from 2.5L from hospital admission).   In the ED, it seems that she may have had sudden loss of consciousness when she came to [facility], and she was intubated. Her CXR showed extensive parenchymal scarring in both upper lobes, small bilateral pleural effusions. CT head was negative for acute process. EKG sinus rhythm with prolonged QTc 532. Labs significant for leukocytosis 20.9. Cr 1.00. Ionized Ca 0.98, Magnesium 0.9, BNP 222, Lactic Acid 2.59. ABG with hypercarbia with normal pH. She was given 2L bolus,  Magnesium, started on Vanc/Zosyn, and duoneb. Patient was transferred to [facility] for further care.   PAST MEDICAL HISTORY :  She  has a past medical history of Anxiety disorder; Atrial fibrillation ([place]); Avascular necrosis of hip (H[facility]); CHF (congestive heart failure) ([place]); COPD (chronic obstructive pulmonary disease) ([place]); ETOH abuse; HTN (hypertension); Hyperglycemia; On home O2; and Psoriasis.  PAST SURGICAL HISTORY: She  has a past surgical history that includes Total hip arthroplasty (05-15-08); Tonsillectomy; and Joint replacement.  Allergies  Allergen Reactions  . Celexa [Citalopram Hydrobromide] Other (See Comments)    "Jerky movements"  . Nickel     Unknown reaction  . Tramadol Hcl Nausea And Vomiting    No current facility-administered medications on file prior to encounter.    Current Outpatient Prescriptions on File Prior to Encounter  Medication Sig  . albuterol (PROAIR HFA) 108 (90 BASE) MCG/ACT inhaler INHALE 2 PUFFS INTO THE LUNGS EVERY 6 HOURS AS NEEDED FOR WHEEZING OR SHORTNESS OF BREATH  . albuterol (PROVENTIL) (2.5 MG/3ML) 0.083% nebulizer solution Take 2.5 mg by nebulization every morning.  Marland Kitchen ALPRAZolam (XANAX) 0.5 MG tablet Take 1 tablet (0.5 mg total) by mouth 2 (two) times daily as needed for anxiety.  Marland Kitchen amitriptyline (ELAVIL) 50 MG tablet Take 1 tablet by mouth at  bedtime  . aspirin 325 MG tablet Take 325 mg by mouth every evening.   Marland Kitchen buPROPion (WELLBUTRIN XL) 150 MG 24 hr tablet Take 1 tablet by mouth  daily (Patient taking differently: Take 1 tablet by mouth  daily in the evening)  . cetirizine (ALL  DAY ALLERGY) 10 MG tablet Take 10 mg by mouth at bedtime.   . cholecalciferol (VITAMIN D) 1000 UNITS tablet Take 1,000 Units by mouth every morning.   Marland Kitchen EQL CALCIUM CITRATE/VITAMIN D PO Take 1 each by mouth 2 (two) times daily. Vitamin D 500IU with Calcium Citrate '630mg'$   Chewables  . Fluticasone Furoate-Vilanterol (BREO ELLIPTA) 100-25 MCG/INH AEPB Inhale 1 puff into  the lungs daily.   . furosemide (LASIX) 20 MG tablet Take 1 tablet by mouth  daily (Patient taking differently: Take 1 tablet by mouth  daily in the morning)  . guaifenesin (MUCUS RELIEF) 400 MG TABS tablet Take 400 mg by mouth every morning.   Marland Kitchen HYDROcodone-acetaminophen (NORCO) 10-325 MG tablet Take 1 tablet by mouth 2 (two) times daily.  Marland Kitchen levofloxacin (LEVAQUIN) 750 MG tablet Take 1 tablet (750 mg total) by mouth daily.  . metoprolol (LOPRESSOR) 50 MG tablet Take 1 tablet by mouth two  times daily  . tiotropium (SPIRIVA) 18 MCG inhalation capsule Place 1 capsule (18 mcg total) into inhaler and inhale every morning.  . vitamin C (ASCORBIC ACID) 500 MG tablet Take 500 mg by mouth every morning.    FAMILY HISTORY:  Her indicated that her mother is deceased. She indicated that her father is deceased. She indicated that the status of her sister is unknown. She indicated that the status of her brother is unknown. She indicated that her paternal grandfather is deceased.    SOCIAL HISTORY: She  reports that she quit smoking about 6 years ago. She has a 35.00 pack-year smoking history. She has never used smokeless tobacco. She reports that she drinks alcohol. She reports that she does not use drugs.  REVIEW OF SYSTEMS:   Unable to obtain as patient is intubated   VITAL SIGNS: BP 109/90   Pulse 89   Temp 98.1 F (36.7 C)   Resp (!) 26   Ht '5\' 2"'$  (1.575 m)   Wt 37.2 kg (82 lb)   SpO2 95%   BMI 15.00 kg/m   HEMODYNAMICS:    VENTILATOR SETTINGS: Vent Mode: PRVC FiO2 (%):  [40 %-80 %] 40 % Set Rate:  [14 bmp-18 bmp] 18 bmp Vt Set:  [400 mL-420 mL] 400 mL PEEP:  [5 cmH20] 5 cmH20 Plateau Pressure:  [24 cmH20-30 cmH20] 30 cmH20  INTAKE / OUTPUT: No intake/output data recorded.  PHYSICAL EXAMINATION: GEN: NAD, intubated, not responding to voice or commands but per nursing was following command a little earlier, thin/frail female  HEENT: cervical collar; bruising noted on forehead,  PERRL  CV: RRR, no murmurs, rubs, or gallops PULM: intubated, normal effort, normal air movement slightly decreased at bases bilaterally, no crackles heard, some expiratory wheezing heard   ABD: Soft, nontender, nondistended, NABS, no organomegaly SKIN: No rash or cyanosis, bruising noted on extremities; warm and well-perfused, but unable to palpate DP pulses  EXTR: No lower extremity edema or calf tenderness Neuro: does not open eyes or move extremities to command. Does move extremities spontaneously   LABS:  BMET  Recent Labs Lab 12/23/15 0612 12/25/15 0628 12/29/15 1905 12/29/15 1913  NA 141 138 136 136  K 3.3* 3.8 3.5 3.4*  CL 86* 84* 85* 81*  CO2 47* 45* 41*  --   BUN 21* 24* 12 11  CREATININE 0.72 0.82 1.03* 1.00  GLUCOSE 93 118* 93 91    Electrolytes  Recent Labs Lab 12/23/15 0612 12/25/15 0628 12/29/15 1905  CALCIUM 9.0 9.0 8.1*  MG  --   --  0.9*    CBC  Recent Labs Lab 12/23/15 0612 12/25/15 0628 12/29/15 1905 12/29/15 1913  WBC 9.4 12.8* 20.9*  --   HGB 7.5* 8.1* 7.8* 9.2*  HCT 26.1* 28.3* 26.5* 27.0*  PLT 349 368 416*  --     Coag's No results for input(s): APTT, INR in the last 168 hours.  Sepsis Markers  Recent Labs Lab 12/29/15 1918  LATICACIDVEN 2.59*    ABG  Recent Labs Lab 12/29/15 2000  PHART 7.351  PCO2ART 65.8*  PO2ART 245*    Liver Enzymes  Recent Labs Lab 12/29/15 1905  AST 20  ALT 14  ALKPHOS 79  BILITOT 0.5  ALBUMIN 2.4*    Cardiac Enzymes No results for input(s): TROPONINI, PROBNP in the last 168 hours.  Glucose  Recent Labs Lab 12/29/15 1856  GLUCAP 95    Imaging Ct Head Wo Contrast  Result Date: 12/29/2015 CLINICAL DATA:  Altered mental status EXAM: CT HEAD WITHOUT CONTRAST TECHNIQUE: Contiguous axial images were obtained from the base of the skull through the vertex without intravenous contrast. COMPARISON:  CT head 11/27/2015 FINDINGS: Brain: Image quality degraded by mild motion.  Generalized atrophy stable. Chronic microvascular ischemic change the white matter, stable. Negative for acute infarct. Negative for acute hemorrhage or mass. No shift of the midline structures. Vascular: No hyperdense vessel or unexpected calcification. Skull: Negative Sinuses/Orbits: Negative Other: None IMPRESSION: No acute abnormality. Atrophy and chronic microvascular ischemic changes are stable. Electronically Signed   By: Marlan Palau M.D.   On: 12/29/2015 19:42   Dg Chest Port 1 View  Result Date: 12/29/2015 CLINICAL DATA:  Status post intubation. EXAM: PORTABLE CHEST 1 VIEW COMPARISON:  12/21/2015. FINDINGS: 1915 hours. Endotracheal tube tip is approximately 5 cm above the base of the carina. Bilateral upper lobe scarring again noted. Hyperexpansion and diffuse underlying chronic interstitial lung disease, as before. Small bilateral pleural effusions noted. The cardiopericardial silhouette is within normal limits for size. Defibrillator pads overlie the lower chest. Telemetry leads overlie the chest. IMPRESSION: 1. Status post intubation with improved aeration at the lung bases. 2. Extensive parenchymal scarring in both upper lobes as before with underlying diffuse interstitial chronic lung disease. 3. Small bilateral pleural effusions. Electronically Signed   By: Kennith Center M.D.   On: 12/29/2015 19:43     STUDIES:  CT Head 10/13: no acute abnormality. Atrophy and chronic microvascular ischemic changes stable CXR 10/13: extensive parenchymal scarring in both upper lobes, small bilateral pleural effusions  CULTURES: Blood Cx  10/13 >> Urine Cx 10/13 >>  ANTIBIOTICS: Zosyn 10/13 >> Vancomycin 10/13 >>  SIGNIFICANT EVENTS: 10/13: Admit  LINES/TUBES: ETT 10/13 >> Urethral Catheter 10/13 >> PIV x 3   ASSESSMENT / PLAN: Ms. Arciga is a 70 yo female 70 yo female with PMH of COPD, hx of rheumatic fever, HTN, HLD, RA, PVD, Diastolic HF (EF 55-60% G1DD in 2015), protein calorie  malnutrition who presents with lethargy and AMS found to be in acute on chronic hypercarbic respiratory failure in the setting of what seems like end-stage COPD and required intubation in the ED. She also met SIRS criteria with hypotension and leukocytosis. Hypotension did improve with IV fluids. Possible source for SIRS is not quite clear as no overt consolidation on CXR and UA wnl. Differentials for AMS include acute on chronic hypercarbia vs questionable infectious process vs cardiac?Marland Kitchen  PULMONARY A: Acute on Chronic Hypercarbic Respiratory Failure  Chronic Interstitial Lung Disease/COPD possible exacerbation Possible HCAP? P:   Intubated  ABG in AM  Hold off SBT until stable Solumedrol '40mg'$  q 6  Duoneb q 6 hours   CARDIOVASCULAR A:  Hypotension: unclear in etiology. Is responsive to IV fluids. EKG with flatted T waves in inferior-lateral leads, no ST depression noted. istat trop 0.00.  Diastolic CHF: BNP 254. Clinically does not look volume overloaded.  P:  Venous oxygen saturation Troponin  consider ECHO  INFECTIOUS A:   SIRS criteria:  with Hypotension and Leukocytosis. Unclear source.  P:   empiric coverage with Vanc and Zosyn pro-calcitonin follow blood cultures  NEUROLOGIC A:   AMS: possibly due to hypercarbia. Other considerations include infectious source. CT head negative for acute process. Seizure like activity?: no history of seizures   P:   UDS TSH EEG  Consider consulting neurology in the AM Fentanyl PRN for pain Versed PRN for agitation   RENAL A:   Mildly Elevated Cr: Baseline ~0.8  Lactic Acidosis Hypomagnesemia: s/p repletion in the ED Hypokalemia: mild at 3.4   P:   trend lactic acid  Follow electrolytes   GASTROINTESTINAL A:   No issues currently  P:   consider starting tube feeds in the AM NPO  HEMATOLOGIC A:   Anemia: hgb 7.8 in ED, with repeat 9.2. (Baseline ~8). No signs of active bleeding  Leukocytosis: unclear source of possible  infection  P:  Monitor with labs   ENDOCRINE A:   No issues  P:   Monitor cbg with BMPs   FAMILY  - Updates:   - Inter-disciplinary family meet or Palliative Care meeting due by:  10/21   Smiley Houseman, MD PGY 2 Family Medicine   12/30/2015, 2:31 AM

## 2015-12-30 NOTE — Progress Notes (Signed)
Unable to remove pt rings on both fingers due to swelling of her fingers. Notified family. Will continue to monitor.

## 2015-12-31 ENCOUNTER — Inpatient Hospital Stay (HOSPITAL_COMMUNITY): Payer: Medicare Other

## 2015-12-31 DIAGNOSIS — J9601 Acute respiratory failure with hypoxia: Secondary | ICD-10-CM

## 2015-12-31 DIAGNOSIS — J189 Pneumonia, unspecified organism: Secondary | ICD-10-CM

## 2015-12-31 LAB — CBC WITH DIFFERENTIAL/PLATELET
BASOS PCT: 0 %
Basophils Absolute: 0 10*3/uL (ref 0.0–0.1)
EOS ABS: 0 10*3/uL (ref 0.0–0.7)
EOS PCT: 0 %
HCT: 27.5 % — ABNORMAL LOW (ref 36.0–46.0)
HEMOGLOBIN: 8.3 g/dL — AB (ref 12.0–15.0)
LYMPHS ABS: 0.3 10*3/uL — AB (ref 0.7–4.0)
Lymphocytes Relative: 4 %
MCH: 27.8 pg (ref 26.0–34.0)
MCHC: 30.2 g/dL (ref 30.0–36.0)
MCV: 92 fL (ref 78.0–100.0)
MONOS PCT: 2 %
Monocytes Absolute: 0.2 10*3/uL (ref 0.1–1.0)
NEUTROS PCT: 94 %
Neutro Abs: 7.8 10*3/uL — ABNORMAL HIGH (ref 1.7–7.7)
PLATELETS: 347 10*3/uL (ref 150–400)
RBC: 2.99 MIL/uL — ABNORMAL LOW (ref 3.87–5.11)
RDW: 17 % — ABNORMAL HIGH (ref 11.5–15.5)
WBC: 8.3 10*3/uL (ref 4.0–10.5)

## 2015-12-31 LAB — RENAL FUNCTION PANEL
Albumin: 1.8 g/dL — ABNORMAL LOW (ref 3.5–5.0)
Anion gap: 5 (ref 5–15)
BUN: 8 mg/dL (ref 6–20)
CALCIUM: 8.2 mg/dL — AB (ref 8.9–10.3)
CO2: 30 mmol/L (ref 22–32)
CREATININE: 0.77 mg/dL (ref 0.44–1.00)
Chloride: 103 mmol/L (ref 101–111)
Glucose, Bld: 247 mg/dL — ABNORMAL HIGH (ref 65–99)
Phosphorus: 2.2 mg/dL — ABNORMAL LOW (ref 2.5–4.6)
Potassium: 4.5 mmol/L (ref 3.5–5.1)
SODIUM: 138 mmol/L (ref 135–145)

## 2015-12-31 LAB — RESPIRATORY PANEL BY PCR
Adenovirus: NOT DETECTED
BORDETELLA PERTUSSIS-RVPCR: NOT DETECTED
CHLAMYDOPHILA PNEUMONIAE-RVPPCR: NOT DETECTED
Coronavirus 229E: NOT DETECTED
Coronavirus HKU1: NOT DETECTED
Coronavirus NL63: NOT DETECTED
Coronavirus OC43: NOT DETECTED
INFLUENZA A-RVPPCR: NOT DETECTED
Influenza B: NOT DETECTED
Metapneumovirus: NOT DETECTED
Mycoplasma pneumoniae: NOT DETECTED
PARAINFLUENZA VIRUS 3-RVPPCR: NOT DETECTED
PARAINFLUENZA VIRUS 4-RVPPCR: NOT DETECTED
Parainfluenza Virus 1: NOT DETECTED
Parainfluenza Virus 2: NOT DETECTED
RESPIRATORY SYNCYTIAL VIRUS-RVPPCR: NOT DETECTED
RHINOVIRUS / ENTEROVIRUS - RVPPCR: NOT DETECTED

## 2015-12-31 LAB — GLUCOSE, CAPILLARY
GLUCOSE-CAPILLARY: 154 mg/dL — AB (ref 65–99)
GLUCOSE-CAPILLARY: 176 mg/dL — AB (ref 65–99)
GLUCOSE-CAPILLARY: 209 mg/dL — AB (ref 65–99)
Glucose-Capillary: 240 mg/dL — ABNORMAL HIGH (ref 65–99)
Glucose-Capillary: 200 mg/dL — ABNORMAL HIGH (ref 65–99)
Glucose-Capillary: 144 mg/dL — ABNORMAL HIGH (ref 65–99)

## 2015-12-31 LAB — TYPE AND SCREEN
ABO/RH(D): A NEG
ANTIBODY SCREEN: NEGATIVE
Unit division: 0

## 2015-12-31 LAB — PROCALCITONIN

## 2015-12-31 LAB — MAGNESIUM: MAGNESIUM: 1.9 mg/dL (ref 1.7–2.4)

## 2015-12-31 MED ORDER — INSULIN ASPART 100 UNIT/ML ~~LOC~~ SOLN
0.0000 [IU] | SUBCUTANEOUS | Status: DC
  Administered 2015-12-31 (×2): 3 [IU] via SUBCUTANEOUS
  Administered 2015-12-31: 2 [IU] via SUBCUTANEOUS
  Administered 2015-12-31: 3 [IU] via SUBCUTANEOUS
  Administered 2015-12-31 – 2016-01-01 (×2): 5 [IU] via SUBCUTANEOUS
  Administered 2016-01-01 (×3): 3 [IU] via SUBCUTANEOUS
  Administered 2016-01-01: 5 [IU] via SUBCUTANEOUS
  Administered 2016-01-02 (×3): 2 [IU] via SUBCUTANEOUS
  Administered 2016-01-02: 3 [IU] via SUBCUTANEOUS

## 2015-12-31 MED ORDER — DEXMEDETOMIDINE HCL IN NACL 200 MCG/50ML IV SOLN
0.4000 ug/kg/h | INTRAVENOUS | Status: DC
  Administered 2015-12-31 – 2016-01-01 (×3): 0.4 ug/kg/h via INTRAVENOUS
  Administered 2016-01-01 – 2016-01-02 (×2): 0.8 ug/kg/h via INTRAVENOUS
  Filled 2015-12-31 (×7): qty 50

## 2015-12-31 NOTE — Progress Notes (Signed)
PULMONARY / CRITICAL CARE MEDICINE   Name: [name] MRN: [id number] DOB: 08-11-1945    ADMISSION DATE:  12/29/2015 CONSULTATION DATE:  10/13  REFERRING MD:  Forestine Na  CHIEF COMPLAINT:  AMS/  HISTORY OF PRESENT ILLNESS:   [name] is a 70 yo female with PMH of COPD, hx of rheumatic fever, HTN, HLD, RA, PVD, Diastolic HF (EF 0000000 [id number] in 2015), protein calorie malnutrition who presents with lethargy and AMS. She was recently hospitalized (10/3-10/9) for HCAP, mild COPD exacerbation and Acute Hypoxemic Respiratory Failure. She was discharged with Levaquin PO for 5 more days.   Her family reports she was not her baseline at the time of discharge on 10/9. She was "not coherent", she would babble and have hallucinations (talking to deceased relatives). This has been worsening over the past few days. Over the past two days, family member reports she was having "seizures"; she describes these episodes as one strong "jerk" with a couple of "sporadic ones" which would last a few seconds. No associated urinary or bowel incontinence. No prior history of seizure like activity. Would have multiple episodes as frequent as 30 mins to 1 hour. Reports seizures were more frequent today. She also reports she has been having many falls. At baseline, patient is able to have a conversation appropriately. Family members denies patient having fevers. She does use home oxygen of 3.5 L (recently increased from 2.5L from hospital admission).   In the ED, it seems that she may have had sudden loss of consciousness when she came to [facility], and she was intubated. Her CXR showed extensive parenchymal scarring in both upper lobes, small bilateral pleural effusions. CT head was negative for acute process. EKG sinus rhythm with prolonged QTc 532. Labs significant for leukocytosis 20.9. Cr 1.00. Ionized Ca 0.98, Magnesium 0.9, BNP 222, Lactic Acid 2.59. ABG with hypercarbia with normal pH. She was given 2L bolus,  Magnesium, started on Vanc/Zosyn, and duoneb. Patient was transferred to [facility] for further care.   SUBJECTIVE:  No acute events since admission. Patient having significant agitation and tachycardia with spontaneous breathing trial this morning on pressure support 5/5.  REVIEW OF SYSTEMS:  Unable to obtain as patient is intubated.   VITAL SIGNS: BP 113/65   Pulse (!) 117   Temp 98.6 F (37 C)   Resp 17   Ht 5\' 2"  (1.575 m)   Wt 94 lb 12.8 oz (43 kg)   SpO2 97%   BMI 17.34 kg/m   HEMODYNAMICS:    VENTILATOR SETTINGS: Vent Mode: PSV;CPAP FiO2 (%):  [40 %] 40 % Set Rate:  [18 bmp] 18 bmp Vt Set:  [400 mL] 400 mL PEEP:  [5 cmH20] 5 cmH20 Pressure Support:  [5 cmH20] 5 cmH20 Plateau Pressure:  [22 cmH20-26 cmH20] 26 cmH20  INTAKE / OUTPUT: I/O last 3 completed shifts: In: 6190.4 [I.V.:4539.1; Blood:330; NG/GT:371.3; IV Piggyback:950] Out: 945 [Urine:945]  PHYSICAL EXAMINATION: General:  Obviously uncomfortable. No family at bedside.  Integument:  Warm & dry. No rash on exposed skin.  HEENT: No scleral injection. Endotracheal tube in place. Moist mucous membranes. Cardiovascular: Tachycardic with regular rhythm. No edema. No appreciable JVD.  Pulmonary:  Coarse breath sounds bilaterally with expiratory wheezing. Symmetric chest wall rise on ventilator. Abdomen: Soft. Normal bowel sounds. Nondistended.  Neurological: Attends to voice. Nods to questions. Moving all 4 extremities equally and grossly nonfocal.  LABS:  BMET  Recent Labs Lab 12/30/15 0450 12/30/15 1810 12/31/15 0240  NA 138 138  138  K 2.7* 4.1 4.5  CL 96* 100* 103  CO2 34* 30 30  BUN 6 <5* 8  CREATININE 0.93 0.87 0.77  GLUCOSE 87 132* 247*    Electrolytes  Recent Labs Lab 12/29/15 1905 12/30/15 0450 12/30/15 1810 12/31/15 0240  CALCIUM 8.1* 7.3* 7.8* 8.2*  MG 0.9* 2.6*  --  1.9  PHOS  --  1.7*  --  2.2*    CBC  Recent Labs Lab 12/29/15 1905  12/30/15 0450 12/30/15 1810 12/31/15 0240   WBC 20.9*  --  10.4  --  8.3  HGB 7.8*  < > 6.2* 8.6* 8.3*  HCT 26.5*  < > 20.6* 28.3* 27.5*  PLT 416*  --  330  --  347  < > = values in this interval not displayed.  Coag's No results for input(s): APTT, INR in the last 168 hours.  Sepsis Markers  Recent Labs Lab 12/29/15 1918 12/30/15 0450 12/30/15 0640 12/31/15 0240  LATICACIDVEN 2.59* 1.0 0.8  --   PROCALCITON  --  <0.10  --  <0.10    ABG  Recent Labs Lab 12/29/15 2000 12/30/15 0440  PHART 7.351 7.495*  PCO2ART 65.8* 44.6  PO2ART 245* 147*    Liver Enzymes  Recent Labs Lab 12/29/15 1905 12/30/15 0450 12/31/15 0240  AST 20 16  --   ALT 14 12*  --   ALKPHOS 79 61  --   BILITOT 0.5 0.6  --   ALBUMIN 2.4* 1.8* 1.8*    Cardiac Enzymes  Recent Labs Lab 12/30/15 0450  TROPONINI <0.03    Glucose  Recent Labs Lab 12/29/15 1856 12/30/15 2037 12/31/15 0002 12/31/15 0400 12/31/15 0826  GLUCAP 95 132* 209* 240* 200*    Imaging No results found.  STUDIES:  CT Head 10/13: no acute abnormality. Atrophy and chronic microvascular ischemic changes stable CXR 10/13: extensive parenchymal scarring in both upper lobes, small bilateral pleural effusions EEG 10/14:  This EEG is consistent with a generalized non-specific cerebral dysfunction(encephalopathy). There was no seizure or seizure predisposition recorded on this study. The tech noted multiple "jerks" and "tremor" which were not well seen on the video, but there was no seizure or epileptiform activity associated with these movements.   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   P:   Full Vent Support Repeat spontaneous breathing trial in the morning Solu-Medrol 40mg  IV q6hr Continuing Duoneb q6hr  CARDIOVASCULAR A:  Shock - Likely hypovolemia. Resolved with IVF. H/O Diastolic CHF  P:  Monitoring on telemetry Vitals per unit protocol  INFECTIOUS A:   SIRS - No clear source of infection.  P:   Empiric Vancomycin & Zosyn day #2 Awaiting culture results Trending Procalcitonin per algorithm  NEUROLOGIC A:   Acute Encephalopathy - Likely due to hypercarbia. Possibly due to toxic metabolic. Sedation on Ventilator Questionable Seizure Activity - Negative EEG.  P:   RASS Goal:  0 to -1 Fentanyl PRN for pain Versed PRN for agitation  Starting Precedex infusion  RENAL A:   Hypokalemia - Replaced. Acute Renal Failure - Resolved. Lactic Acidosis - Resolved.  Hypomagnesemia - Resolved. Hypophosphatemia - Mild.  P:   Monitoring UOP with foley Trending electrolytes & renal function daily Replacing electrolytes as indicated  GASTROINTESTINAL A:   No issues currently   P:   NPO Pepcid VT daily Holding Tube Feedings  HEMATOLOGIC A:   Anemia - No signs of active bleeding. Transfused 2u PRBC 10/14. Leukocytosis - Resolved.  P:  Trending cell counts daily w/ CBC SCDs  ENDOCRINE A:   Hyperglycemia - No h/o DM. Likely due to steroids.  P:   Accu-Checks q4hr SSI per Moderate Algorithm  MUSCULOSKELETAL A: Frequent Falls  P: Continuing Soft C-collar Checking CT C-spin w/o contrast   FAMILY  - Updates:  No family at bedside 10/15.  - Inter-disciplinary family meet or Palliative Care meeting due by:  10/21  TODAY'S SUMMARY:  70 y.o. female 70 yo female with PMH of COPD, hx of rheumatic fever, HTN, HLD, RA, PVD, Diastolic HF (EF 0000000 [id number] in 2015), protein calorie malnutrition who presents with lethargy and AMS found to be in acute on chronic hypercarbic respiratory failure in the setting of what seems like  end-stage COPD and required intubation in the ED. patient's degree of alertness has dramatically improved with correction of her hypercarbia. Continuing empiric antibiotics for severe community-acquired pneumonia. Patient with significant agitation therefore starting Precedex infusion as an anxiolytic. Attempt spontaneous breathing trial again in the morning.  I have spent a total of 36 minutes of critical care time today caring for the patient and reviewing the patient's electronic medical record.   Sonia Baller Ashok Cordia, M.D. [facility] Pulmonary & Critical Care Pager:  [phone] After 3pm or if no response, call [phone] 12/31/2015, 10:32 AM

## 2015-12-31 NOTE — Progress Notes (Signed)
RT note-placed back to full support due to agitation and restlessness.

## 2015-12-31 NOTE — Progress Notes (Signed)
4 rings given to daughter and husband.

## 2015-12-31 NOTE — Progress Notes (Signed)
Pt transported to CT via ventilator. Pt remained stable throughout. No complications noted.

## 2015-12-31 NOTE — Progress Notes (Signed)
[place] Progress Note Patient Name: [name] DOB: 18-Jul-1945 MRN: [id number]   Date of Service  12/31/2015  HPI/Events of Note  Rising CBG on steroids Not a known diabetic  eICU Interventions  SSI- mod     Intervention Category Intermediate Interventions: Hyperglycemia - evaluation and treatment  ALVA,RAKESH V. 12/31/2015, 4:05 AM

## 2016-01-01 ENCOUNTER — Ambulatory Visit: Payer: Medicare Other | Admitting: Nurse Practitioner

## 2016-01-01 ENCOUNTER — Inpatient Hospital Stay (HOSPITAL_COMMUNITY): Payer: Medicare Other

## 2016-01-01 DIAGNOSIS — J9600 Acute respiratory failure, unspecified whether with hypoxia or hypercapnia: Secondary | ICD-10-CM

## 2016-01-01 LAB — RENAL FUNCTION PANEL
Albumin: 1.9 g/dL — ABNORMAL LOW (ref 3.5–5.0)
Anion gap: 5 (ref 5–15)
BUN: 13 mg/dL (ref 6–20)
CHLORIDE: 106 mmol/L (ref 101–111)
CO2: 29 mmol/L (ref 22–32)
CREATININE: 0.6 mg/dL (ref 0.44–1.00)
Calcium: 8.6 mg/dL — ABNORMAL LOW (ref 8.9–10.3)
GFR calc non Af Amer: >60 mL/min (ref >60)
Glucose, Bld: 220 mg/dL — ABNORMAL HIGH (ref 65–99)
POTASSIUM: 3.9 mmol/L (ref 3.5–5.1)
Phosphorus: 1.3 mg/dL — ABNORMAL LOW (ref 2.5–4.6)
Sodium: 140 mmol/L (ref 135–145)

## 2016-01-01 LAB — CBC WITH DIFFERENTIAL/PLATELET
Basophils Absolute: 0 10*3/uL (ref 0.0–0.1)
Basophils Relative: 0 %
EOS ABS: 0 10*3/uL (ref 0.0–0.7)
Eosinophils Relative: 0 %
HEMATOCRIT: 28.8 % — AB (ref 36.0–46.0)
HEMOGLOBIN: 8.6 g/dL — AB (ref 12.0–15.0)
LYMPHS ABS: 0.6 10*3/uL — AB (ref 0.7–4.0)
LYMPHS PCT: 3 %
MCH: 28 pg (ref 26.0–34.0)
MCHC: 29.9 g/dL — AB (ref 30.0–36.0)
MCV: 93.8 fL (ref 78.0–100.0)
MONOS PCT: 2 %
Monocytes Absolute: 0.4 10*3/uL (ref 0.1–1.0)
NEUTROS ABS: 22.7 10*3/uL — AB (ref 1.7–7.7)
NEUTROS PCT: 95 %
Platelets: 362 10*3/uL (ref 150–400)
RBC: 3.07 MIL/uL — AB (ref 3.87–5.11)
RDW: 16.9 % — ABNORMAL HIGH (ref 11.5–15.5)
WBC: 23.7 10*3/uL — AB (ref 4.0–10.5)

## 2016-01-01 LAB — GLUCOSE, CAPILLARY
GLUCOSE-CAPILLARY: 215 mg/dL — AB (ref 65–99)
GLUCOSE-CAPILLARY: 176 mg/dL — AB (ref 65–99)
GLUCOSE-CAPILLARY: 119 mg/dL — AB (ref 65–99)
Glucose-Capillary: 158 mg/dL — ABNORMAL HIGH (ref 65–99)
Glucose-Capillary: 160 mg/dL — ABNORMAL HIGH (ref 65–99)
Glucose-Capillary: 218 mg/dL — ABNORMAL HIGH (ref 65–99)

## 2016-01-01 LAB — MAGNESIUM
MAGNESIUM: 2.5 mg/dL — AB (ref 1.7–2.4)
Magnesium: 1.4 mg/dL — ABNORMAL LOW (ref 1.7–2.4)

## 2016-01-01 LAB — URINE CULTURE: Culture: NO GROWTH

## 2016-01-01 LAB — PROCALCITONIN: PROCALCITONIN: 0.14 ng/mL

## 2016-01-01 LAB — PHOSPHORUS: PHOSPHORUS: 3 mg/dL (ref 2.5–4.6)

## 2016-01-01 MED ORDER — SODIUM CHLORIDE 0.9 % IV SOLN
1.0000 mg | Freq: Once | INTRAVENOUS | Status: DC
  Filled 2016-01-01: qty 0.2

## 2016-01-01 MED ORDER — FOLIC ACID 5 MG/ML IJ SOLN
1.0000 mg | Freq: Once | INTRAMUSCULAR | Status: AC
  Administered 2016-01-01: 1 mg via INTRAVENOUS
  Filled 2016-01-01: qty 0.2

## 2016-01-01 MED ORDER — THIAMINE HCL 100 MG/ML IJ SOLN
100.0000 mg | Freq: Every day | INTRAMUSCULAR | Status: DC
  Administered 2016-01-01 – 2016-01-03 (×3): 100 mg via INTRAVENOUS
  Filled 2016-01-01 (×3): qty 1

## 2016-01-01 MED ORDER — SODIUM PHOSPHATES 45 MMOLE/15ML IV SOLN
30.0000 mmol | Freq: Once | INTRAVENOUS | Status: AC
  Administered 2016-01-01: 30 mmol via INTRAVENOUS
  Filled 2016-01-01: qty 10

## 2016-01-01 MED ORDER — SODIUM CHLORIDE 0.9 % IV SOLN
6.0000 g | Freq: Once | INTRAVENOUS | Status: AC
  Administered 2016-01-01: 6 g via INTRAVENOUS
  Filled 2016-01-01: qty 12

## 2016-01-01 NOTE — Progress Notes (Signed)
Patient transported to CT and back to room [id number] without complications.

## 2016-01-01 NOTE — Progress Notes (Signed)
Still not had HRCT Will assess for extubaion 01/02/16 after goals of care  Dr. Brand Males, M.D., [facility].C.P Pulmonary and Critical Care Medicine Staff Physician [place] Pulmonary and Critical Care Pager: [phone], If no answer or between  15:00h - 7:00h: call [phone]  01/01/2016 2:18 PM

## 2016-01-01 NOTE — Consult Note (Signed)
[facility] Nurse wound consult note Reason for Consult: Multiple bilateral leg wounds Wound type: Abrasions that are partial thickness in nature  Measurement: There are multiple, scattered partial thickness abrasions along the front of the lower legs, the patient indicates are abrasions from a fall. Wound bed:  All wound beds are pink, without odor, very small  Periwound: Fragile, thin, skin Dressing procedure/placement/frequency: Foam dressings to abrasions.  Change every 3 - 4 days.   Discussed POC with patient and bedside nurse.  Re consult if needed, will not follow at this time. Thanks Val Riles MSN, RN, CNS-BC, [employer]

## 2016-01-01 NOTE — Progress Notes (Signed)
PULMONARY / CRITICAL CARE MEDICINE   Name: [name] MRN: [id number] DOB: 1945/04/25    ADMISSION DATE:  12/29/2015 CONSULTATION DATE:  10/13  REFERRING MD:  Forestine Na  CHIEF COMPLAINT:  AMS/  HISTORY OF PRESENT ILLNESS:   [name] is a 70 yo female with PMH of COPD, hx of rheumatic fever, HTN, HLD, RA, PVD, Diastolic HF (EF [phone] [id number] in 2015), protein calorie malnutrition who presents with lethargy and AMS. She was recently hospitalized (10/3-10/9) for HCAP, mild COPD exacerbation and Acute Hypoxemic Respiratory Failure. She was discharged with Levaquin PO for 5 more days.   Her family reports she was not her baseline at the time of discharge on 10/9. She was "not coherent", she would babble and have hallucinations (talking to deceased relatives). This has been worsening over the past few days. Over the past two days, family member reports she was having "seizures"; she describes these episodes as one strong "jerk" with a couple of "sporadic ones" which would last a few seconds. No associated urinary or bowel incontinence. No prior history of seizure like activity. Would have multiple episodes as frequent as 30 mins to 1 hour. Reports seizures were more frequent today. She also reports she has been having many falls. At baseline, patient is able to have a conversation appropriately. Family members denies patient having fevers. She does use home oxygen of 3.5 L (recently increased from 2.5L from hospital admission).   In the ED, it seems that she may have had sudden loss of consciousness when she came to [facility], and she was intubated. Her CXR showed extensive parenchymal scarring in both upper lobes, small bilateral pleural effusions. CT head was negative for acute process. EKG sinus rhythm with prolonged QTc 532. Labs significant for leukocytosis 20.9. Cr 1.00. Ionized Ca 0.98, Magnesium 0.9, BNP 222, Lactic Acid 2.59. ABG with hypercarbia with normal pH. She was given 2L bolus,  Magnesium, started on Vanc/Zosyn, and duoneb. Patient was transferred to [facility] for further care.   SUBJECTIVE:  No acute events overnight. Patient doing well this am.   REVIEW OF SYSTEMS:  Unable to obtain as patient is intubated.   VITAL SIGNS: BP (!) 145/74   Pulse 69   Temp 97 F (36.1 C)   Resp 14   Ht 5\' 2"  (1.575 m)   Wt 44.2 kg (97 lb 7.1 oz)   SpO2 99%   BMI 17.82 kg/m   HEMODYNAMICS:    VENTILATOR SETTINGS: Vent Mode: PRVC FiO2 (%):  [40 %] 40 % Set Rate:  [18 bmp] 18 bmp Vt Set:  [400 mL] 400 mL PEEP:  [5 cmH20] 5 cmH20 Pressure Support:  [5 cmH20] 5 cmH20 Plateau Pressure:  [21 cmH20-22 cmH20] 21 cmH20  INTAKE / OUTPUT: I/O last 3 completed shifts: In: 4098.2 [I.V.:2586.9; Blood:330; NG/GT:731.3; IV Piggyback:450] Out: 940 [Urine:940]  PHYSICAL EXAMINATION: General:  NAD. Awake and alert, follows commands Integument:  Warm & dry. Bruising noted on extremities  HEENT: No scleral injection. Endotracheal tube in place. Moist mucous membranes. Cardiovascular: Tachycardic with regular rhythm. No edema. No appreciable JVD.  Pulmonary:  Improved air movement, with minimal expiratory wheeze noted. Symmetric chest wall rise on ventilator. Abdomen: Soft. Normal bowel sounds. Nondistended.  Neurological: Attends to voice. Nods to questions. Moving all 4 extremities equally and grossly nonfocal.  LABS:  BMET  Recent Labs Lab 12/30/15 1810 12/31/15 0240 01/01/16 0235  NA 138 138 140  K 4.1 4.5 3.9  CL 100* 103 106  CO2 30 30 29   BUN <5* 8 13  CREATININE 0.87 0.77 0.60  GLUCOSE 132* 247* 220*    Electrolytes  Recent Labs Lab 12/30/15 0450 12/30/15 1810 12/31/15 0240 01/01/16 0232 01/01/16 0235  CALCIUM 7.3* 7.8* 8.2*  --  8.6*  MG 2.6*  --  1.9 1.4*  --   PHOS 1.7*  --  2.2*  --  1.3*    CBC  Recent Labs Lab 12/30/15 0450 12/30/15 1810 12/31/15 0240 01/01/16 0232  WBC 10.4  --  8.3 23.7*  HGB 6.2* 8.6* 8.3* 8.6*  HCT 20.6* 28.3* 27.5*  28.8*  PLT 330  --  347 362    Coag's No results for input(s): APTT, INR in the last 168 hours.  Sepsis Markers  Recent Labs Lab 12/29/15 1918 12/30/15 0450 12/30/15 0640 12/31/15 0240 01/01/16 0232  LATICACIDVEN 2.59* 1.0 0.8  --   --   PROCALCITON  --  <0.10  --  <0.10 0.14    ABG  Recent Labs Lab 12/29/15 2000 12/30/15 0440  PHART 7.351 7.495*  PCO2ART 65.8* 44.6  PO2ART 245* 147*    Liver Enzymes  Recent Labs Lab 12/29/15 1905 12/30/15 0450 12/31/15 0240 01/01/16 0235  AST 20 16  --   --   ALT 14 12*  --   --   ALKPHOS 79 61  --   --   BILITOT 0.5 0.6  --   --   ALBUMIN 2.4* 1.8* 1.8* 1.9*    Cardiac Enzymes  Recent Labs Lab 12/30/15 0450  TROPONINI <0.03    Glucose  Recent Labs Lab 12/31/15 0826 12/31/15 1223 12/31/15 1612 12/31/15 1946 01/01/16 0001 01/01/16 0408  GLUCAP 200* 144* 154* 176* 218* 215*    Imaging Ct Cervical Spine Wo Contrast  Result Date: 12/31/2015 CLINICAL DATA:  Recent fall. EXAM: CT CERVICAL SPINE WITHOUT CONTRAST TECHNIQUE: Multidetector CT imaging of the cervical spine was performed without intravenous contrast. Multiplanar CT image reconstructions were also generated. COMPARISON:  Recent chest x-rays. FINDINGS: Alignment: There is straightening of normal lordosis. 2 mm of anterior listhesis of C7 versus T1, thought to be degenerative. No traumatic malalignment. Skull base and vertebrae: No fractures. Soft tissues and spinal canal: Shotty nodes in the superior mediastinum are likely reactive. Changes in the lungs described below. An ET tube and NG tube are identified flat the distal tips are not included on this study. No other soft tissue abnormalities are identified. Mild narrowing of the spinal canal at C5-6 measuring 9 mm in AP dimension. Disc levels: Multilevel degenerative changes most marked at C5-6 and C6-7. Upper chest: A large bulla is seen in the right upper lobe. Apical opacities were better assessed on  recent chest x-rays and previous CT imaging. There is certainly a chronic component. An acute on chronic component cannot be excluded on these limited views. An ET tube is identified. The distal tip is not visualized. Other: No other abnormalities. IMPRESSION: 1. No fracture or traumatic malalignment. 2 mm of anterior listhesis of C7 versus T1 is thought to be degenerative. 2. Changes again seen in the lung apices, better assessed with recent chest x-rays. Electronically Signed   By: Dorise Bullion III M.D   On: 12/31/2015 14:13    STUDIES:  CT Head 10/13: no acute abnormality. Atrophy and chronic microvascular ischemic changes stable CXR 10/13: extensive parenchymal scarring in both upper lobes, small bilateral pleural effusions EEG 10/14:  This EEG is consistent with a generalized non-specific cerebral dysfunction(encephalopathy). There was  no seizure or seizure predisposition recorded on this study. The tech noted multiple "jerks" and "tremor" which were not well seen on the video, but there was no seizure or epileptiform activity associated with these movements.  CT C-spine 10/15: No fracture or traumatic malalignment. 2 mm of anterior listhesis of C7 versus T1 is thought to be degenerative  MICROBIOLOGY: Blood Cx x2  10/13 >> NG x 2 days Urine Cx 10/13 >> pending Tracheal Asp Ctx 10/14 >> abundant wbc, no organisms, culture pending  Respiratory Panel PCR 10/14:  Negative   ANTIBIOTICS: Zosyn 10/13 >> Vancomycin 10/13 >>  SIGNIFICANT EVENTS: 10/13 - Admit  LINES/TUBES: OETT 7.5 10/13 >> Foley 10/13 >> OGT 10/13 >> PIV x 3  ASSESSMENT / PLAN: 70 y.o. female 70 yo female with PMH of COPD, hx of rheumatic fever, HTN, HLD, RA, PVD, Diastolic HF (EF [phone] [id number] in 2015), protein calorie malnutrition who presents with lethargy and AMS found to be in acute on chronic hypercarbic respiratory failure in the setting of what seems like end-stage COPD and required intubation in the ED. Patient's  degree of alertness has dramatically improved with correction of her hypercarbia. Continuing empiric antibiotics for severe community-acquired pneumonia.  Attempt spontaneous breathing trial again in the morning.  PULMONARY A: Acute on Chronic Hypercarbic Respiratory Failure  COPD Exacerbation - Horrible bullous emphysema on CT.   P:   Full Vent Support Consider daily SBT Solu-Medrol 40mg  IV q6hr (day #3)  Continuing Duoneb q6hr  CARDIOVASCULAR A:  Shock - Likely hypovolemia. Resolved with IVF. H/O Diastolic CHF  P:  Monitoring on telemetry Vitals per unit protocol  INFECTIOUS A:   SIRS - No clear source of infection.  P:   Empiric Vancomycin & Zosyn day #3 Awaiting culture results Trending Procalcitonin per algorithm  NEUROLOGIC A:   Acute Encephalopathy - Likely due to hypercarbia. Possibly due to toxic metabolic. Sedation on Ventilator Questionable Seizure Activity - Negative EEG. Possible History of Alcohol Use  P:   RASS Goal:  0 to -1 Fentanyl PRN for pain Versed PRN for agitation  Precedex infusion Consider adding Folate and Thiamine   RENAL A:   Hypokalemia - resolved. Acute Renal Failure - Resolved. Lactic Acidosis - Resolved. Hypomagnesemia - mild, repleted Hypophosphatemia - Mild, repleted  P:   Monitoring UOP with foley Trending electrolytes & renal function daily Replacing electrolytes as indicated NS @75cc /hr  GASTROINTESTINAL A:   No issues currently   P:   NPO Pepcid VT daily Tube Feedings  HEMATOLOGIC A:   Anemia - No signs of active bleeding. Transfused 2u PRBC 10/14. hgb stable  Leukocytosis - worsened today.  P:  Trending cell counts daily w/ CBC SCDs  ENDOCRINE A:   Hyperglycemia - No h/o DM. Likely due to steroids.  P:   Accu-Checks q4hr SSI per Moderate Algorithm  MUSCULOSKELETAL A: Frequent Falls: CT neck without acute abnormality   P: Discontinue Soft C-collar   FAMILY  - Updates:    -  Inter-disciplinary family meet or Palliative Care meeting due by:  10/21  Smiley Houseman, MD PGY 2 Family Medicine 01/01/2016, 6:28 AM

## 2016-01-01 NOTE — Progress Notes (Signed)
[facility] ADULT ICU REPLACEMENT PROTOCOL FOR AM LAB REPLACEMENT ONLY  The patient does apply for the [facility] Adult ICU Electrolyte Replacment Protocol based on the criteria listed below:   1. Is GFR >/= 40 ml/min? Yes.    Patient's GFR today is >60 2. Is urine output >/= 0.5 ml/kg/hr for the last 6 hours? Yes.   Patient's UOP is 3.80ml/kg/hr 3. Is BUN < 60 mg/dL? Yes.    Patient's BUN today is 13 4. Abnormal electrolyte  Mg 1.3, Phos 1.3 5. Ordered repletion with: per protocol 6. If a panic level lab has been reported, has the CCM MD in charge been notified? Yes.  .   Physician:  Mike Craze 01/01/2016 6:47 AM

## 2016-01-02 ENCOUNTER — Inpatient Hospital Stay (HOSPITAL_COMMUNITY): Payer: Medicare Other

## 2016-01-02 LAB — CBC WITH DIFFERENTIAL/PLATELET
BASOS ABS: 0 10*3/uL (ref 0.0–0.1)
BASOS PCT: 0 %
EOS ABS: 0 10*3/uL (ref 0.0–0.7)
EOS PCT: 0 %
HCT: 31.8 % — ABNORMAL LOW (ref 36.0–46.0)
Hemoglobin: 9.8 g/dL — ABNORMAL LOW (ref 12.0–15.0)
Lymphocytes Relative: 3 %
Lymphs Abs: 0.5 10*3/uL — ABNORMAL LOW (ref 0.7–4.0)
MCH: 28.4 pg (ref 26.0–34.0)
MCHC: 30.8 g/dL (ref 30.0–36.0)
MCV: 92.2 fL (ref 78.0–100.0)
MONO ABS: 0.3 10*3/uL (ref 0.1–1.0)
Monocytes Relative: 2 %
Neutro Abs: 16.8 10*3/uL — ABNORMAL HIGH (ref 1.7–7.7)
Neutrophils Relative %: 95 %
Platelets: 330 10*3/uL (ref 150–400)
RBC: 3.45 MIL/uL — AB (ref 3.87–5.11)
RDW: 16.2 % — AB (ref 11.5–15.5)
WBC: 17.6 10*3/uL — AB (ref 4.0–10.5)

## 2016-01-02 LAB — GLUCOSE, CAPILLARY
GLUCOSE-CAPILLARY: 118 mg/dL — AB (ref 65–99)
GLUCOSE-CAPILLARY: 130 mg/dL — AB (ref 65–99)
GLUCOSE-CAPILLARY: 155 mg/dL — AB (ref 65–99)
Glucose-Capillary: 134 mg/dL — ABNORMAL HIGH (ref 65–99)
Glucose-Capillary: 143 mg/dL — ABNORMAL HIGH (ref 65–99)
Glucose-Capillary: 84 mg/dL (ref 65–99)
Glucose-Capillary: 50 mg/dL — ABNORMAL LOW (ref 65–99)

## 2016-01-02 LAB — RENAL FUNCTION PANEL
ALBUMIN: 2.1 g/dL — AB (ref 3.5–5.0)
Anion gap: 8 (ref 5–15)
BUN: 11 mg/dL (ref 6–20)
CALCIUM: 8.3 mg/dL — AB (ref 8.9–10.3)
CO2: 33 mmol/L — ABNORMAL HIGH (ref 22–32)
Chloride: 99 mmol/L — ABNORMAL LOW (ref 101–111)
Creatinine, Ser: 0.63 mg/dL (ref 0.44–1.00)
GFR calc Af Amer: >60 mL/min (ref >60)
GLUCOSE: 166 mg/dL — AB (ref 65–99)
PHOSPHORUS: 2.6 mg/dL (ref 2.5–4.6)
POTASSIUM: 2.8 mmol/L — AB (ref 3.5–5.1)
SODIUM: 140 mmol/L (ref 135–145)

## 2016-01-02 LAB — BASIC METABOLIC PANEL
Anion gap: 10 (ref 5–15)
BUN: 9 mg/dL (ref 6–20)
CHLORIDE: 98 mmol/L — AB (ref 101–111)
CO2: 29 mmol/L (ref 22–32)
Calcium: 8.8 mg/dL — ABNORMAL LOW (ref 8.9–10.3)
Creatinine, Ser: 0.56 mg/dL (ref 0.44–1.00)
GFR calc Af Amer: >60 mL/min (ref >60)
GFR calc non Af Amer: >60 mL/min (ref >60)
GLUCOSE: 60 mg/dL — AB (ref 65–99)
POTASSIUM: 4.7 mmol/L (ref 3.5–5.1)
Sodium: 137 mmol/L (ref 135–145)

## 2016-01-02 LAB — CYCLIC CITRUL PEPTIDE ANTIBODY, IGG/IGA: CCP Antibodies IgG/IgA: 4 units (ref 0–19)

## 2016-01-02 LAB — MAGNESIUM: MAGNESIUM: 2.1 mg/dL (ref 1.7–2.4)

## 2016-01-02 LAB — CULTURE, RESPIRATORY

## 2016-01-02 LAB — ANTINUCLEAR ANTIBODIES, IFA: ANTINUCLEAR ANTIBODIES, IFA: NEGATIVE

## 2016-01-02 LAB — ANTI-SCLERODERMA ANTIBODY: Scleroderma (Scl-70) (ENA) Antibody, IgG: <0.2 AI (ref 0.0–0.9)

## 2016-01-02 LAB — SJOGRENS SYNDROME-B EXTRACTABLE NUCLEAR ANTIBODY: SSB (La) (ENA) Antibody, IgG: <0.2 AI (ref 0.0–0.9)

## 2016-01-02 LAB — CULTURE, RESPIRATORY W GRAM STAIN

## 2016-01-02 LAB — RHEUMATOID FACTOR

## 2016-01-02 LAB — SJOGRENS SYNDROME-A EXTRACTABLE NUCLEAR ANTIBODY

## 2016-01-02 MED ORDER — ENOXAPARIN SODIUM 30 MG/0.3ML ~~LOC~~ SOLN: 30.0000 mg | SUBCUTANEOUS | Status: DC

## 2016-01-02 MED ORDER — POTASSIUM CHLORIDE 20 MEQ/15ML (10%) PO SOLN
40.0000 meq | ORAL | Status: AC
  Administered 2016-01-02 (×2): 40 meq
  Filled 2016-01-02 (×2): qty 30

## 2016-01-02 MED ORDER — DEXTROSE 50 % IV SOLN
INTRAVENOUS | Status: AC
  Administered 2016-01-02: 50 mL
  Filled 2016-01-02: qty 50

## 2016-01-02 MED ORDER — MUPIROCIN 2 % EX OINT
1.0000 "application " | TOPICAL_OINTMENT | Freq: Two times a day (BID) | CUTANEOUS | Status: DC
  Administered 2016-01-02 – 2016-01-05 (×8): 1 via NASAL
  Filled 2016-01-02 (×2): qty 22

## 2016-01-02 MED ORDER — ENOXAPARIN SODIUM 30 MG/0.3ML ~~LOC~~ SOLN
30.0000 mg | SUBCUTANEOUS | Status: DC
  Administered 2016-01-02 – 2016-01-05 (×4): 30 mg via SUBCUTANEOUS
  Filled 2016-01-02 (×6): qty 0.3

## 2016-01-02 MED ORDER — ENOXAPARIN SODIUM 40 MG/0.4ML ~~LOC~~ SOLN
40.0000 mg | SUBCUTANEOUS | Status: DC
  Filled 2016-01-02: qty 0.4

## 2016-01-02 MED ORDER — METHYLPREDNISOLONE SODIUM SUCC 40 MG IJ SOLR
40.0000 mg | Freq: Two times a day (BID) | INTRAMUSCULAR | Status: DC
Start: 2016-01-02 — End: 2016-01-03
  Administered 2016-01-02 – 2016-01-03 (×2): 40 mg via INTRAVENOUS
  Filled 2016-01-02 (×2): qty 1

## 2016-01-02 MED ORDER — CHLORHEXIDINE GLUCONATE CLOTH 2 % EX PADS
6.0000 | MEDICATED_PAD | Freq: Every day | CUTANEOUS | Status: DC
  Administered 2016-01-02 – 2016-01-05 (×4): 6 via TOPICAL

## 2016-01-02 MED ORDER — FAMOTIDINE 40 MG/5ML PO SUSR
20.0000 mg | Freq: Every day | ORAL | Status: DC
  Administered 2016-01-02 – 2016-01-05 (×4): 20 mg via ORAL
  Filled 2016-01-02 (×4): qty 2.5

## 2016-01-02 NOTE — Progress Notes (Signed)
[facility] ADULT ICU REPLACEMENT PROTOCOL FOR AM LAB REPLACEMENT ONLY  The patient does apply for the [facility] Adult ICU Electrolyte Replacment Protocol based on the criteria listed below:   1. Is GFR >/= 40 ml/min? Yes.    Patient's GFR today is >60 2. Is urine output >/= 0.5 ml/kg/hr for the last 6 hours? Yes.   Patient's UOP is 1.69 ml/kg/hr 3. Is BUN < 60 mg/dL? Yes.    Patient's BUN today is 11 4. Abnormal electrolyte  K 2.8 5. Ordered repletion with: per protocol 6. If a panic level lab has been reported, has the CCM MD in charge been notified? Yes.  .   Physician:  Philbert Riser 01/02/2016 4:15 AM

## 2016-01-02 NOTE — Progress Notes (Signed)
Pharmacy Antibiotic Note  [name] is a 70 y.o. female admitted on 12/29/2015 with pneumonia.  Pharmacy has been consulted for Vancomycin and Zosyn dosing - day # 5 of therapy.   Good UOP (3.3 cc/kg/hr). SCr 0.63 (improved this admission and stable now).  WBC trending down today at 17.6. PCT 0.14, LA trending down. Afebrile.  Rare staph aureus noted in respiratory culture (tracheal aspirate) from 10/14, still pending.   Plan: Continue Vancomycin 750 mg IV every 24 hours - borderline for increase but clinically improving and low body weight.  Continue Zosyn 3.375g IV every 8 hours - 4 hour infusion.  Monitor culture results and clinical status.  Monitor renal function and need to further adjust dose.  Monitor length of therapy - currently day #5 (typical duration 7 days for pneumonia).   Height: 5\' 2"  (157.5 cm) Weight: 95 lb 7.4 oz (43.3 kg) IBW/kg (Calculated) : 50.1  Temp (24hrs), Avg:97.4 F (36.3 C), Min:96.6 F (35.9 C), Max:98.2 F (36.8 C)   Recent Labs Lab 12/29/15 1905  12/29/15 1918 12/30/15 0450 12/30/15 0640 12/30/15 1810 12/31/15 0240 01/01/16 0232 01/01/16 0235 01/02/16 0219  WBC 20.9*  --   --  10.4  --   --  8.3 23.7*  --  17.6*  CREATININE 1.03*  < >  --  0.93  --  0.87 0.77  --  0.60 0.63  LATICACIDVEN  --   --  2.59* 1.0 0.8  --   --   --   --   --   < > = values in this interval not displayed.  Estimated Creatinine Clearance: 44.1 mL/min (by C-G formula based on SCr of 0.63 mg/dL).    Allergies  Allergen Reactions  . Celexa [Citalopram Hydrobromide] Other (See Comments)    "Jerky movements"  . Nickel     Unknown reaction  . Tramadol Hcl Nausea And Vomiting    Antimicrobials this admission: Zosyn 10/13 >> Vancomycin 10/13 >>  Dose adjustments this admission: na  Microbiology results: 0/14 blood cx: ngtd x3 days 10/14 resp cx: rare staph aureus, rare candida 10/13 Urine: neg 10/14 RVP: neg 10/3 MRSA pcr pos  Thank you for  allowing pharmacy to be a part of this patient's care.  Sloan Leiter, PharmD, BCPS Clinical Pharmacist [phone] 01/02/2016 10:44 AM

## 2016-01-02 NOTE — Progress Notes (Addendum)
Interim Note:   Spoke with daughter, Clydene Laming, regarding patient's desire to not be re-intubated. Daughter reports she does not think patient had discussed [place] with family. I reported to her that patient is currently awake and alert and nodding or shaking her head yes or no to questions appropriately. I reported that the patient stated to treatment team earlier that she does not desire to be re-intubated (she is currently intubated in is doing a SBT for possible extubation later on today). Daughter's concern is that patient had been confused and had visual hallucinations since she was discharged from her last hospital stay, and wonders if she completely understands what she is asking for. Additionally Ms. Eller reports that in the past, patient had not tolerated BiPap very well because she is claustrophobic and required sedation and intubation. Ms. Jerolyn Center would like to speak with patient's husband and get back to treatment team.   I spoke with patient again and she now reports that she would like to be re-intubated if needed. She denies visual or auditory hallucinations. Updated family that patient did well with SBT and will try extubation to BiPap.   Smiley Houseman, MD PGY 2 Family Medicine

## 2016-01-02 NOTE — Progress Notes (Signed)
Pt extubated to 5 L [place]. Pt was getting agitated and attemping top reach for ETT prior to extubation. Positive cuff leak. Pt able to vocalize, voice very soft and raspy. Vitals stable. Will cont to monitor

## 2016-01-02 NOTE — Progress Notes (Signed)
PULMONARY / CRITICAL CARE MEDICINE   Name: [name] MRN: [id number] DOB: Jun 03, 1945    ADMISSION DATE:  12/29/2015 CONSULTATION DATE:  10/13  REFERRING MD:  [name]  CHIEF COMPLAINT:  AMS/  HISTORY OF PRESENT ILLNESS:   [name] is a 70 yo female with PMH of COPD, hx of rheumatic fever, HTN, HLD, RA, PVD, Diastolic HF (EF [id number] in 2015), protein calorie malnutrition who presents with lethargy and AMS. She was recently hospitalized (10/3-10/9) for HCAP, mild COPD exacerbation and Acute Hypoxemic Respiratory Failure. She was discharged with Levaquin PO for 5 more days.   Her family reports she was not her baseline at the time of discharge on 10/9. She was "not coherent", she would babble and have hallucinations (talking to deceased relatives). This has been worsening over the past few days. Over the past two days, family member reports she was having "seizures"; she describes these episodes as one strong "jerk" with a couple of "sporadic ones" which would last a few seconds. No associated urinary or bowel incontinence. No prior history of seizure like activity. Would have multiple episodes as frequent as 30 mins to 1 hour. Reports seizures were more frequent today. She also reports she has been having many falls. At baseline, patient is able to have a conversation appropriately. Family members denies patient having fevers. She does use home oxygen of 3.5 L (recently increased from 2.5L from hospital admission).   In the ED, it seems that she may have had sudden loss of consciousness when she came to [facility], and she was intubated. Her CXR showed extensive parenchymal scarring in both upper lobes, small bilateral pleural effusions. CT head was negative for acute process. EKG sinus rhythm with prolonged QTc 532. Labs significant for leukocytosis 20.9. Cr 1.00. Ionized Ca 0.98, Magnesium 0.9, BNP 222, Lactic Acid 2.59. ABG with hypercarbia with normal pH. She was given 2L bolus,  Magnesium, started on Vanc/Zosyn, and duoneb. Patient was transferred to [facility] for further care.   SUBJECTIVE:  Noted to have hypokalemia of 2.8 this AM which was repleted. No acute events overnight.   REVIEW OF SYSTEMS:  No chest pain, abdominal pain, sob.   VITAL SIGNS: BP (!) 155/76   Pulse 64   Temp 97.3 F (36.3 C) (Oral)   Resp 18   Ht 5\' 2"  (1.575 m)   Wt 95 lb 7.4 oz (43.3 kg)   SpO2 100%   BMI 17.46 kg/m   HEMODYNAMICS:    VENTILATOR SETTINGS: Vent Mode: PRVC FiO2 (%):  [40 %] 40 % Set Rate:  [18 bmp] 18 bmp Vt Set:  [400 mL] 400 mL PEEP:  [5 cmH20] 5 cmH20 Pressure Support:  [5 cmH20] 5 cmH20 Plateau Pressure:  [19 cmH20-21 cmH20] 21 cmH20  INTAKE / OUTPUT: I/O last 3 completed shifts: In: 4687.4 [I.V.:3092.4; NG/GT:1000; IV Piggyback:595] Out: 3185 [Urine:3185]  PHYSICAL EXAMINATION: General:  NAD. Awake and alert, follows commands Integument:  Warm & dry. Bruising noted on extremities  HEENT: No scleral injection. Endotracheal tube in place. Moist mucous membranes. Cardiovascular: Tachycardic with regular rhythm. No edema. No appreciable JVD.  Pulmonary:  Improved air movement, with minimal expiratory wheeze noted. Symmetric chest wall rise on ventilator. Abdomen: Soft. Normal bowel sounds. Nondistended.  Neurological: Attends to voice. Nods to questions. Moving all 4 extremities equally and grossly nonfocal.  LABS:  BMET  Recent Labs Lab 12/31/15 0240 01/01/16 0235 01/02/16 0219  Goodwin 138 140 140  K 4.5 3.9 2.8*  CL 103 106 99*  CO2 30 29 33*  BUN 8 13 11   CREATININE 0.77 0.60 0.63  GLUCOSE 247* 220* 166*    Electrolytes  Recent Labs Lab 12/31/15 0240 01/01/16 0232 01/01/16 0235 01/01/16 2047 01/02/16 0219  CALCIUM 8.2*  --  8.6*  --  8.3*  MG 1.9 1.4*  --  2.5* 2.1  PHOS 2.2*  --  1.3* 3.0 2.6    CBC  Recent Labs Lab 12/31/15 0240 01/01/16 0232 01/02/16 0219  WBC 8.3 23.7* 17.6*  HGB 8.3* 8.6* 9.8*  HCT 27.5* 28.8* 31.8*   PLT 347 362 330    Coag's No results for input(s): APTT, INR in the last 168 hours.  Sepsis Markers  Recent Labs Lab 12/29/15 1918 12/30/15 0450 12/30/15 0640 12/31/15 0240 01/01/16 0232  LATICACIDVEN 2.59* 1.0 0.8  --   --   PROCALCITON  --  <0.10  --  <0.10 0.14    ABG  Recent Labs Lab 12/29/15 2000 12/30/15 0440  PHART 7.351 7.495*  PCO2ART 65.8* 44.6  PO2ART 245* 147*    Liver Enzymes  Recent Labs Lab 12/29/15 1905 12/30/15 0450 12/31/15 0240 01/01/16 0235 01/02/16 0219  AST 20 16  --   --   --   ALT 14 12*  --   --   --   ALKPHOS 79 61  --   --   --   BILITOT 0.5 0.6  --   --   --   ALBUMIN 2.4* 1.8* 1.8* 1.9* 2.1*    Cardiac Enzymes  Recent Labs Lab 12/30/15 0450  TROPONINI <0.03    Glucose  Recent Labs Lab 01/01/16 0801 01/01/16 1133 01/01/16 1609 01/01/16 2015 01/02/16 0016 01/02/16 0410  GLUCAP 176* 158* 119* 160* 155* 134*    Imaging Ct Chest High Resolution  Result Date: 01/01/2016 CLINICAL DATA:  [age]-year-old female with respiratory failure. EXAM: CT CHEST WITHOUT CONTRAST TECHNIQUE: Multidetector CT imaging of the chest was performed following the standard protocol without intravenous contrast. High resolution imaging of the lungs, as well as inspiratory and expiratory imaging, was performed. COMPARISON:  Multiple chest radiographs dating back to the chest CT dated 03/31/2014 FINDINGS: Evaluation of this exam is limited in the absence of intravenous contrast. Cardiovascular: There is diffuse atherosclerotic calcification of the thoracic aorta. There is no aneurysmal dilatation. There is atherosclerotic calcification of the origins of the great vessels of the aortic arch. The central pulmonary arteries are grossly unremarkable on this noncontrast study. There is no cardiomegaly or pericardial effusion. There is coronary vascular calcification. There is hypoattenuation of the cardiac blood pool suggestive of a degree of anemia.  Clinical correlation is recommended. Mediastinum/Nodes: Right paratracheal lymph node measures 14 mm in short axis. Evaluation for hilar adenopathy is limited on this noncontrast study. An enteric tube is seen with tip in the upper esophagus. Recommend advancement of the tube into the stomach. There is fluid within the mid esophagus. Lungs/Pleura: There is advanced emphysematous changes of the lungs with upper lobe scarring and biapical bulla. Areas of consolidative changes at the lung bases posteriorly are new from prior study and likely represent combination of small pleural effusions and infiltrate. Clinical correlation is recommended. There is a 3.6 x 0.9 cm consolidative area in the left lower lobe extending to the peripheral lung and the pleural surface which is new from prior study. There is a stable area of scarring in the right infrahilar region and right lung base anteriorly which is relatively similar  to prior CT. There is no pneumothorax. An endotracheal tube is noted with tip just above the carina. The central airways are patent. Upper Abdomen: The visualized upper abdomen appears unremarkable. Musculoskeletal: There is diffuse subcutaneous edema and anasarca, increased from prior study. No fluid collection. There is no axillary adenopathy. There is degenerative changes of the spine. No acute fracture. 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 There are small bilateral pleural effusions with consolidative changes of the posterior lung bases which are new from prior CT and concerning for superimposed pneumonia. Clinical correlation and follow-up recommended. No pneumothorax. Endotracheal tube at the level of the carina. Enteric tube is within the upper esophagus. Recommend repositioning advancement into the stomach. Electronically Signed   By: Anner Crete M.D.   On: 01/01/2016 21:53    STUDIES:  CT Head 10/13: no acute abnormality.  Atrophy and chronic microvascular ischemic changes stable CXR 10/13: extensive parenchymal scarring in both upper lobes, small bilateral pleural effusions EEG 10/14:  This EEG is consistent with a generalized non-specific cerebral dysfunction(encephalopathy). There was no seizure or seizure predisposition recorded on this study. The tech noted multiple "jerks" and "tremor" which were not well seen on the video, but there was no seizure or epileptiform activity associated with these movements.  CT C-spine 10/15: No fracture or traumatic malalignment. 2 mm of anterior listhesis of C7 versus T1 is thought to be degenerative High Resolution Chest CT 10/16: Severe bullous emphysematous changes of the lungs with parenchymal thickening and scarring. There are small bilateral pleural effusions with consolidative changes of the posterior lung bases which are new from prior CT and concerning for superimposed pneumonia.  Autoimmune Labs 10/16: RF < 10;  CXR 10/17: no acute changes   MICROBIOLOGY: Blood Cx x2  10/13 >> NG x 3 days Urine Cx 10/13 >> no growth  Tracheal Asp Ctx 10/14 >> rare staph aureus, rare candida albicans Respiratory Panel PCR 10/14:  Negative   ANTIBIOTICS: Zosyn 10/13 >> Vancomycin 10/13 >>  SIGNIFICANT EVENTS: 10/13 - Admit  LINES/TUBES: OETT 7.5 10/13 >> Foley 10/13 >> OGT 10/13 >> PIV x 3  ASSESSMENT / PLAN: 70 y.o. female 70 yo female with PMH of COPD, hx of rheumatic fever, HTN, HLD, RA, PVD, Diastolic HF (EF [id number] in 2015), protein calorie malnutrition who presents with lethargy and AMS found to be in acute on chronic hypercarbic respiratory failure in the setting of what seems like end-stage COPD and required intubation in the ED. Patient's degree of alertness has dramatically improved with correction of her hypercarbia. Continuing empiric antibiotics for severe community-acquired pneumonia.   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 There are small bilateral pleural effusions with consolidative changes of the posterior lung bases which are new from prior CT and concerning for superimposed pneumonia. COPD Exacerbation   P:   PRVC Consider daily SBT Solu-Medrol 40mg  IV q6hr (day #5)  Continuing Duoneb q6hr Autoimmune Labs pending   CARDIOVASCULAR A:  Shock - Likely hypovolemia. Resolved with IVF. H/O Diastolic CHF  P:  Monitoring on telemetry Vitals per unit protocol  INFECTIOUS A:   SIRS - No clear source of infection.  P:   Empiric Vancomycin & Zosyn day #5 Awaiting culture results  NEUROLOGIC A:   Acute Encephalopathy - Likely due to hypercarbia. Possibly due to toxic metabolic; resolved  Questionable Seizure  Activity - Negative EEG. Possible History of Alcohol Use per grandson   P:   RASS Goal:  0 to -1 Fentanyl PRN for pain Versed PRN for agitation  Precedex infusion Fentanyl infusion Folate and Thiamine   RENAL A:   Hypokalemia - 2.8 this am, repleted . Acute Renal Failure - Resolved. Lactic Acidosis - Resolved. Hypomagnesemia - resolved Hypophosphatemia - resolved  P:   Monitoring UOP with foley Trending electrolytes & renal function daily Replacing electrolytes as indicated NS @75cc /hr > consider discontinuing BMP this AM and @ 8PM  GASTROINTESTINAL A:   No issues currently   P:   NPO Pepcid VT daily Tube Feedings held overnight   HEMATOLOGIC A:   Anemia - No signs of active bleeding. Transfused 2u PRBC 10/14. hgb stable  Leukocytosis - worsened today.  P:  Trending cell counts daily w/ CBC SCDs  ENDOCRINE A:   Hyperglycemia - No h/o DM. Likely due to steroids.  P:   Accu-Checks q4hr SSI per Moderate Algorithm  MUSCULOSKELETAL A: Frequent Falls: CT neck without acute abnormality   P: Discontinued Soft C-collar  FAMILY   - Updates:    - Inter-disciplinary family meet or Palliative Care meeting due by:  10/21  Smiley Houseman, MD PGY 2 Family Medicine 01/02/2016, 6:36 AM

## 2016-01-02 NOTE — Care Management Note (Addendum)
Case Management Note  Patient Details  Name: [name] MRN: [id number] Date of Birth: 1945-12-31  Subjective/Objective:  Pt admitted with acute resp failure - pt is not intubated                  Action/Plan:  Pt recently discharged back home with husband.  [facility] and [facility], pt also uses 3.5L continuous oxygen at home supplied by [employer] [facility].  Pt has had multiple falls recently - Post extubation pt will be evaluated by PT if continuation of care is determined with goals of care meeting today.  CSW informed of tentative referral   Expected Discharge Date:                  Expected Discharge Plan:  Skilled Nursing Facility  In-House Referral:  Clinical Social Work  Discharge planning Services  CM Consult  Post Acute Care Choice:    Choice offered to:     DME Arranged:    DME Agency:     HH Arranged:    [place] Agency:     Status of Service:  In process, will continue to follow  If discussed at Long Length of Stay Meetings, dates discussed:    Additional Comments: CM contacted by [place] requesting update and offering assistance with discharge needs [phone]. Maryclare Labrador, RN 01/02/2016, 10:15 AM

## 2016-01-03 DIAGNOSIS — J9620 Acute and chronic respiratory failure, unspecified whether with hypoxia or hypercapnia: Secondary | ICD-10-CM

## 2016-01-03 LAB — RENAL FUNCTION PANEL
ALBUMIN: 2.2 g/dL — AB (ref 3.5–5.0)
ANION GAP: 5 (ref 5–15)
BUN: 7 mg/dL (ref 6–20)
CHLORIDE: 97 mmol/L — AB (ref 101–111)
CO2: 38 mmol/L — ABNORMAL HIGH (ref 22–32)
Calcium: 9.2 mg/dL (ref 8.9–10.3)
Creatinine, Ser: 0.62 mg/dL (ref 0.44–1.00)
GFR calc Af Amer: >60 mL/min (ref >60)
GFR calc non Af Amer: >60 mL/min (ref >60)
GLUCOSE: 81 mg/dL (ref 65–99)
PHOSPHORUS: 2 mg/dL — AB (ref 2.5–4.6)
POTASSIUM: 3.6 mmol/L (ref 3.5–5.1)
Sodium: 140 mmol/L (ref 135–145)

## 2016-01-03 LAB — CBC WITH DIFFERENTIAL/PLATELET
BASOS PCT: 0 %
Basophils Absolute: 0 10*3/uL (ref 0.0–0.1)
EOS ABS: 0 10*3/uL (ref 0.0–0.7)
EOS PCT: 0 %
HCT: 33.7 % — ABNORMAL LOW (ref 36.0–46.0)
HEMOGLOBIN: 10.2 g/dL — AB (ref 12.0–15.0)
LYMPHS ABS: 1.2 10*3/uL (ref 0.7–4.0)
Lymphocytes Relative: 7 %
MCH: 27.8 pg (ref 26.0–34.0)
MCHC: 30.3 g/dL (ref 30.0–36.0)
MCV: 91.8 fL (ref 78.0–100.0)
MONO ABS: 1 10*3/uL (ref 0.1–1.0)
MONOS PCT: 6 %
Neutro Abs: 15.2 10*3/uL — ABNORMAL HIGH (ref 1.7–7.7)
Neutrophils Relative %: 87 %
PLATELETS: 379 10*3/uL (ref 150–400)
RBC: 3.67 MIL/uL — ABNORMAL LOW (ref 3.87–5.11)
RDW: 15.9 % — AB (ref 11.5–15.5)
WBC: 17.4 10*3/uL — ABNORMAL HIGH (ref 4.0–10.5)

## 2016-01-03 LAB — GLUCOSE, CAPILLARY
GLUCOSE-CAPILLARY: 79 mg/dL (ref 65–99)
GLUCOSE-CAPILLARY: 80 mg/dL (ref 65–99)
GLUCOSE-CAPILLARY: 90 mg/dL (ref 65–99)
Glucose-Capillary: 121 mg/dL — ABNORMAL HIGH (ref 65–99)
Glucose-Capillary: 98 mg/dL (ref 65–99)

## 2016-01-03 LAB — CULTURE, BLOOD (ROUTINE X 2)
CULTURE: NO GROWTH
CULTURE: NO GROWTH

## 2016-01-03 LAB — MAGNESIUM: MAGNESIUM: 1.4 mg/dL — AB (ref 1.7–2.4)

## 2016-01-03 MED ORDER — THIAMINE HCL 100 MG/ML IJ SOLN: 100.0000 mg | Freq: Every day | INTRAMUSCULAR | Status: DC

## 2016-01-03 MED ORDER — FLUCONAZOLE 100 MG PO TABS
150.0000 mg | ORAL_TABLET | ORAL | Status: DC
  Administered 2016-01-03: 150 mg via ORAL
  Filled 2016-01-03: qty 1

## 2016-01-03 MED ORDER — POTASSIUM CHLORIDE CRYS ER 20 MEQ PO TBCR: 40.0000 meq | EXTENDED_RELEASE_TABLET | Freq: Once | ORAL | Status: DC

## 2016-01-03 MED ORDER — VITAMIN B-1 100 MG PO TABS: 100.0000 mg | ORAL_TABLET | Freq: Every day | ORAL | Status: DC

## 2016-01-03 MED ORDER — MAGNESIUM SULFATE 2 GM/50ML IV SOLN
2.0000 g | Freq: Once | INTRAVENOUS | Status: AC
  Administered 2016-01-03: 2 g via INTRAVENOUS
  Filled 2016-01-03: qty 50

## 2016-01-03 MED ORDER — FOLIC ACID 1 MG PO TABS
1.0000 mg | ORAL_TABLET | Freq: Every day | ORAL | Status: DC
  Administered 2016-01-03 – 2016-01-05 (×3): 1 mg via ORAL
  Filled 2016-01-03 (×3): qty 1

## 2016-01-03 MED ORDER — POTASSIUM CHLORIDE CRYS ER 20 MEQ PO TBCR: 20.0000 meq | EXTENDED_RELEASE_TABLET | Freq: Two times a day (BID) | ORAL | Status: DC

## 2016-01-03 MED ORDER — POTASSIUM PHOSPHATES 15 MMOLE/5ML IV SOLN
10.0000 mmol | Freq: Once | INTRAVENOUS | Status: AC
  Administered 2016-01-03: 10 mmol via INTRAVENOUS
  Filled 2016-01-03: qty 3.33

## 2016-01-03 MED ORDER — LORAZEPAM 1 MG PO TABS
1.0000 mg | ORAL_TABLET | Freq: Four times a day (QID) | ORAL | Status: DC | PRN
  Administered 2016-01-03 (×2): 1 mg via ORAL
  Filled 2016-01-03 (×2): qty 1

## 2016-01-03 MED ORDER — VANCOMYCIN HCL IN DEXTROSE 1-5 GM/200ML-% IV SOLN
1000.0000 mg | INTRAVENOUS | Status: AC
  Administered 2016-01-03 – 2016-01-04 (×2): 1000 mg via INTRAVENOUS
  Filled 2016-01-03 (×2): qty 200

## 2016-01-03 MED ORDER — VITAMIN B-1 100 MG PO TABS
100.0000 mg | ORAL_TABLET | Freq: Every day | ORAL | Status: DC
  Administered 2016-01-04 – 2016-01-05 (×2): 100 mg via ORAL
  Filled 2016-01-03 (×2): qty 1

## 2016-01-03 MED ORDER — POTASSIUM PHOSPHATES 15 MMOLE/5ML IV SOLN
10.0000 mmol | Freq: Once | INTRAVENOUS | Status: DC
  Filled 2016-01-03: qty 3.33

## 2016-01-03 MED ORDER — ORAL CARE MOUTH RINSE
15.0000 mL | Freq: Two times a day (BID) | OROMUCOSAL | Status: DC
  Administered 2016-01-04 – 2016-01-05 (×3): 15 mL via OROMUCOSAL

## 2016-01-03 MED ORDER — IPRATROPIUM-ALBUTEROL 0.5-2.5 (3) MG/3ML IN SOLN: 3.0000 mL | Freq: Four times a day (QID) | RESPIRATORY_TRACT | Status: DC | PRN

## 2016-01-03 MED ORDER — BUDESONIDE 0.25 MG/2ML IN SUSP
0.2500 mg | Freq: Two times a day (BID) | RESPIRATORY_TRACT | Status: DC
  Administered 2016-01-03 – 2016-01-05 (×4): 0.25 mg via RESPIRATORY_TRACT
  Filled 2016-01-03 (×6): qty 2

## 2016-01-03 MED ORDER — LORAZEPAM 2 MG/ML IJ SOLN
1.0000 mg | Freq: Four times a day (QID) | INTRAMUSCULAR | Status: DC | PRN
Start: 2016-01-03 — End: 2016-01-04

## 2016-01-03 MED ORDER — METOPROLOL TARTRATE 50 MG PO TABS
50.0000 mg | ORAL_TABLET | Freq: Two times a day (BID) | ORAL | Status: DC
  Administered 2016-01-03 – 2016-01-05 (×5): 50 mg via ORAL
  Filled 2016-01-03 (×6): qty 1

## 2016-01-03 MED ORDER — POTASSIUM CHLORIDE CRYS ER 20 MEQ PO TBCR
20.0000 meq | EXTENDED_RELEASE_TABLET | Freq: Once | ORAL | Status: AC
  Administered 2016-01-03: 20 meq via ORAL
  Filled 2016-01-03: qty 1

## 2016-01-03 MED ORDER — METHYLPREDNISOLONE SODIUM SUCC 40 MG IJ SOLR
40.0000 mg | Freq: Two times a day (BID) | INTRAMUSCULAR | Status: AC
  Administered 2016-01-03 – 2016-01-04 (×2): 40 mg via INTRAVENOUS
  Filled 2016-01-03 (×3): qty 1

## 2016-01-03 MED ORDER — HYDROCORTISONE 1 % EX CREA
TOPICAL_CREAM | Freq: Two times a day (BID) | CUTANEOUS | Status: DC
  Administered 2016-01-03: 1 via TOPICAL
  Administered 2016-01-03 – 2016-01-05 (×4): via TOPICAL
  Filled 2016-01-03 (×2): qty 28

## 2016-01-03 MED ORDER — FUROSEMIDE 20 MG PO TABS
20.0000 mg | ORAL_TABLET | Freq: Every day | ORAL | Status: DC
  Administered 2016-01-03 – 2016-01-05 (×3): 20 mg via ORAL
  Filled 2016-01-03 (×3): qty 1

## 2016-01-03 MED ORDER — IPRATROPIUM-ALBUTEROL 0.5-2.5 (3) MG/3ML IN SOLN
3.0000 mL | Freq: Four times a day (QID) | RESPIRATORY_TRACT | Status: DC
  Administered 2016-01-03 – 2016-01-05 (×9): 3 mL via RESPIRATORY_TRACT
  Filled 2016-01-03 (×10): qty 3

## 2016-01-03 MED ORDER — PREDNISONE 50 MG PO TABS
50.0000 mg | ORAL_TABLET | Freq: Every day | ORAL | Status: DC
  Administered 2016-01-04 – 2016-01-05 (×2): 50 mg via ORAL
  Filled 2016-01-03 (×3): qty 1

## 2016-01-03 NOTE — Progress Notes (Signed)
PULMONARY / CRITICAL CARE MEDICINE   Name: [name] MRN: [id number] DOB: [birth year]    ADMISSION DATE:  12/29/2015 CONSULTATION DATE:  10/13  REFERRING MD:  Forestine Na  CHIEF COMPLAINT:  AMS/  HISTORY OF PRESENT ILLNESS:   [name] is a 70 yo female with PMH of COPD, hx of rheumatic fever, HTN, HLD, RA, PVD, Diastolic HF (EF 0000000 [id number] in 2015), protein calorie malnutrition who presents with lethargy and AMS. She was recently hospitalized (10/3-10/9) for HCAP, mild COPD exacerbation and Acute Hypoxemic Respiratory Failure. She was discharged with Levaquin PO for 5 more days.   Her family reports she was not her baseline at the time of discharge on 10/9. She was "not coherent", she would babble and have hallucinations (talking to deceased relatives). This has been worsening over the past few days. Over the past two days, family member reports she was having "seizures"; she describes these episodes as one strong "jerk" with a couple of "sporadic ones" which would last a few seconds. No associated urinary or bowel incontinence. No prior history of seizure like activity. Would have multiple episodes as frequent as 30 mins to 1 hour. Reports seizures were more frequent today. She also reports she has been having many falls. At baseline, patient is able to have a conversation appropriately. Family members denies patient having fevers. She does use home oxygen of 3.5 L (recently increased from 2.5L from hospital admission).   In the ED, it seems that she may have had sudden loss of consciousness when she came to [facility], and she was intubated. Her CXR showed extensive parenchymal scarring in both upper lobes, small bilateral pleural effusions. CT head was negative for acute process. EKG sinus rhythm with prolonged QTc 532. Labs significant for leukocytosis 20.9. Cr 1.00. Ionized Ca 0.98, Magnesium 0.9, BNP 222, Lactic Acid 2.59. ABG with hypercarbia with normal pH. She was given 2L bolus,  Magnesium, started on Vanc/Zosyn, and duoneb. Patient was transferred to [facility] for further care.   SUBJECTIVE:  No acute events overnight. Patient refused BiPap overnight. Heart rate has been in low 100s up to 140s intermittently since being extubated. BP somewhat elevated.   REVIEW OF SYSTEMS:  No sob, chest pain, palpitations. Doing well patient reports   VITAL SIGNS: BP (!) 153/76   Pulse (!) 109   Temp 98.8 F (37.1 C)   Resp 17   Ht 5\' 2"  (1.575 m)   Wt 41.3 kg (91 lb 0.8 oz)   SpO2 100%   BMI 16.65 kg/m   HEMODYNAMICS:    VENTILATOR SETTINGS: Vent Mode: CPAP;PSV FiO2 (%):  [40 %] 40 % Set Rate:  [18 bmp] 18 bmp Vt Set:  [400 mL] 400 mL PEEP:  [5 cmH20] 5 cmH20 Plateau Pressure:  [21 cmH20] 21 cmH20  INTAKE / OUTPUT: I/O last 3 completed shifts: In: 3650.9 [I.V.:2883.4; NG/GT:160; IV Piggyback:607.5] Out: [id number] [Urine:5230]  PHYSICAL EXAMINATION: General:  NAD. Awake and alert, conversant  Integument:  Warm & dry. Bruising noted on extremities  HEENT: No scleral injection.  Moist mucous membranes. Cardiovascular: Tachycardic with regular rhythm. No edema. No appreciable JVD.  Pulmonary:  Improved air movement, no wheezing but fine crackles diffusely .  Abdomen: Soft. Normal bowel sounds. Nondistended.  Neurological: no focal deficits. Normal speech. Alert and oriented x 3  Skin: bruising noted on extremities   LABS:  BMET  Recent Labs Lab 01/02/16 0219 01/02/16 1652 01/03/16 0247  NA 140 137 140  K 2.8* 4.7  3.6  CL 99* 98* 97*  CO2 33* 29 38*  BUN 11 9 7   CREATININE 0.63 0.56 0.62  GLUCOSE 166* 60* 81    Electrolytes  Recent Labs Lab 01/01/16 2047 01/02/16 0219 01/02/16 1652 01/03/16 0247  CALCIUM  --  8.3* 8.8* 9.2  MG 2.5* 2.1  --  1.4*  PHOS 3.0 2.6  --  2.0*    CBC  Recent Labs Lab 01/01/16 0232 01/02/16 0219 01/03/16 0247  WBC 23.7* 17.6* 17.4*  HGB 8.6* 9.8* 10.2*  HCT 28.8* 31.8* 33.7*  PLT 362 330 379    Coag's No  results for input(s): APTT, INR in the last 168 hours.  Sepsis Markers  Recent Labs Lab 12/29/15 1918 12/30/15 0450 12/30/15 0640 12/31/15 0240 01/01/16 0232  LATICACIDVEN 2.59* 1.0 0.8  --   --   PROCALCITON  --  <0.10  --  <0.10 0.14    ABG  Recent Labs Lab 12/29/15 2000 12/30/15 0440  PHART 7.351 7.495*  PCO2ART 65.8* 44.6  PO2ART 245* 147*    Liver Enzymes  Recent Labs Lab 12/29/15 1905 12/30/15 0450  01/01/16 0235 01/02/16 0219 01/03/16 0247  AST 20 16  --   --   --   --   ALT 14 12*  --   --   --   --   ALKPHOS 79 61  --   --   --   --   BILITOT 0.5 0.6  --   --   --   --   ALBUMIN 2.4* 1.8*  < > 1.9* 2.1* 2.2*  < > = values in this interval not displayed.  Cardiac Enzymes  Recent Labs Lab 12/30/15 0450  TROPONINI <0.03    Glucose  Recent Labs Lab 01/02/16 1203 01/02/16 1524 01/02/16 1930 01/02/16 2158 01/03/16 0003 01/03/16 0409  GLUCAP 130* 84 50* 118* 98 79    Imaging No results found.  STUDIES:  CT Head 10/13: no acute abnormality. Atrophy and chronic microvascular ischemic changes stable CXR 10/13: extensive parenchymal scarring in both upper lobes, small bilateral pleural effusions EEG 10/14:  This EEG is consistent with a generalized non-specific cerebral dysfunction(encephalopathy). There was no seizure or seizure predisposition recorded on this study. The tech noted multiple "jerks" and "tremor" which were not well seen on the video, but there was no seizure or epileptiform activity associated with these movements.  CT C-spine 10/15: No fracture or traumatic malalignment. 2 mm of anterior listhesis of C7 versus T1 is thought to be degenerative  MICROBIOLOGY: Blood Cx x2  10/13 >> NG x 2 days Urine Cx 10/13 >> pending Tracheal Asp Ctx 10/14 >> MRSA Respiratory Panel PCR 10/14:  Negative   ANTIBIOTICS: Zosyn 10/13 >> Vancomycin 10/13 >>  SIGNIFICANT EVENTS: 10/13 - Admit  LINES/TUBES: OETT 7.5 10/13 >> 10/17 Foley  10/13 >> OGT 10/13 >> PIV x 3  ASSESSMENT / PLAN: 70 y.o. female 70 yo female with PMH of COPD, hx of rheumatic fever, HTN, HLD, RA, PVD, Diastolic HF (EF 0000000 [id number] in 2015), protein calorie malnutrition who presents with lethargy and AMS found to be in acute on chronic hypercarbic respiratory failure in the setting of what seems like end-stage COPD and severe MRSA pneumonia. Patient's degree of alertness has dramatically improved with correction of her hypercarbia. Continuing empiric antibiotics for severe community-acquired pneumonia which was found to be MRSA from tracheal aspirate and antibiotic narrowed to Vancomycin.  Was successfully extubated 10/17 and currently on [place]. Likely stable for  transfer today.   PULMONARY A: Acute on Chronic Hypercarbic Respiratory Failure  COPD Exacerbation - Horrible bullous emphysema on CT.  MRSA Pneumonia (noted below)   P:   On nasal canula  Solu-Medrol 40mg  IV q 12 hr (day #4 total). Plan for 2 week taper  Duoneb q6hr  Switched to PRN [employer] home Breo   CARDIOVASCULAR A:  Shock - Likely hypovolemia. Resolved with IVF. H/O Diastolic CHF Tachycardia- seems NSR on telemetry  HTN- mildly elevated   P:  Monitoring on telemetry Vitals per unit protocol Switched duoneb to PRN to help with tachycardia Consider starting home Metoprolol and Lasix   INFECTIOUS A:   MRSA Pneumonia   P:   Empiric Vancomycin day #5  Discontinued Zosyn today due to Tracheal Aspirate   NEUROLOGIC A:   Acute Encephalopathy - Likely due to hypercarbia. Resolved  Questionable Seizure Activity - Negative EEG. Possible History of Alcohol Use - per grandson, patient denies   P:   Folate and Thiamine   RENAL A:   Hypokalemia - mild, repleted  Acute Renal Failure - Resolved. Lactic Acidosis - Resolved. Hypomagnesemia - mild, repleted Hypophosphatemia - Mild, repleted  P:   Monitoring UOP with foley Trending electrolytes & renal function  daily Replacing electrolytes as indicated Mag IV 2 g x 1  Will replete Phos after Mag IV  GASTROINTESTINAL A:   No issues currently   P:   Pepcid VT daily Regular diet    HEMATOLOGIC A:   Anemia - No signs of active bleeding. Transfused 2u PRBC 10/14. hgb stable  Leukocytosis - improving. In the setting of pneumonia   P:  Trending cell counts daily w/ CBC SCDs Lovenox SQ for DVT prophylaxis   ENDOCRINE A:   Hyperglycemia - Resolved, now normal to too tightly controlled. Likely due to steroids. No DM history   P:   Accu-Checks q4hr Discontinue SSI due to low normal cbgs   PSYCH:  Consider restarting Elavil, Wellbutrin  MUSCULOSKELETAL A: Frequent Falls: CT neck without acute abnormality   P: Discontinued Soft C-collar Consider PT/OT consult    FAMILY  - Updates:    - Inter-disciplinary family meet or Palliative Care meeting due by:  10/21  Smiley Houseman, MD PGY 2 Family Medicine 01/03/2016, 6:35 AM

## 2016-01-03 NOTE — Progress Notes (Signed)
Pt refused use of bipap for the night.  Pt was paced on 3-4LPM [place] and sats remained above 95% throughout the night.  RT monitored as necessary.

## 2016-01-03 NOTE — Progress Notes (Signed)
Patient currently comfortable on nasal cannula with vitals stable.  Bipap not needed at this time.

## 2016-01-03 NOTE — Care Management Note (Signed)
Case Management Note  Patient Details  Name: KAYLEANN SAPIA MRN: [id number] Date of Birth: 22-Jan-1946  Subjective/Objective:  Pt admitted with acute resp failure - pt is not intubated                  Action/Plan:  Pt recently discharged back home with husband.  [facility] and [facility], pt also uses 3.5L continuous oxygen at home supplied by [facility].  Pt has had multiple falls recently - Post extubation pt will be evaluated by PT if continuation of care is determined with goals of care meeting today.  CSW informed of tentative referral   Expected Discharge Date:                  Expected Discharge Plan:  Skilled Nursing Facility  In-House Referral:  Clinical Social Work  Discharge planning Services  CM Consult  Post Acute Care Choice:    Choice offered to:     DME Arranged:    DME Agency:     HH Arranged:    [place] Agency:     Status of Service:  In process, will continue to follow  If discussed at Long Length of Stay Meetings, dates discussed:    Additional Comments: 01/03/2016 Pt extubated yesterday.  PT/OT ordered - pending eval  01/02/16 CM contacted by [place] requesting update and offering assistance with discharge needs [phone]. Maryclare Labrador, RN 01/03/2016, 9:20 AM

## 2016-01-03 NOTE — Progress Notes (Signed)
Interim Progress Note:   Called patient's daughter to give update that patient is stable for transfer out of the ICU. I informed her that we did consult palliative care for goals of care discussion since patient has had multiple admissions requiring intubation recently for COPD/PNA. She understood.   Smiley Houseman, MD PGY 2 Family Medicine

## 2016-01-03 NOTE — Progress Notes (Signed)
Pharmacy Antibiotic Note  [name] is a 70 y.o. female admitted on 12/29/2015 with MRSA pneumonia.  Pharmacy has been consulted for Vancomycin- day # 6 out 7 days of therapy.   Good UOP. SCr improved and stable.  WBC trending down today at 17.4. PCT 0.14, LA trending down. Afebrile. Rare staph aureus noted in respiratory culture (tracheal aspirate) from 10/14, still pending.   Plan: Increase Vancomycin to 1000 mg IV every 24 hours. Vancomycin to stop on 10/19 for completion of 7 day course.  Pharmacy will sign off protocol as therapy to complete in AM.  Height: 5\' 2"  (157.5 cm) Weight: 91 lb 0.8 oz (41.3 kg) IBW/kg (Calculated) : 50.1  Temp (24hrs), Avg:98.5 F (36.9 C), Min:97.5 F (36.4 C), Max:99.1 F (37.3 C)   Recent Labs Lab 12/29/15 1918 12/30/15 0450 12/30/15 0640  12/31/15 0240 01/01/16 0232 01/01/16 0235 01/02/16 0219 01/02/16 1652 01/03/16 0247  WBC  --  10.4  --   --  8.3 23.7*  --  17.6*  --  17.4*  CREATININE  --  0.93  --   < > 0.77  --  0.60 0.63 0.56 0.62  LATICACIDVEN 2.59* 1.0 0.8  --   --   --   --   --   --   --   < > = values in this interval not displayed.  Estimated Creatinine Clearance: 42.1 mL/min (by C-G formula based on SCr of 0.62 mg/dL).    Allergies  Allergen Reactions  . Celexa [Citalopram Hydrobromide] Other (See Comments)    "Jerky movements"  . Nickel     Unknown reaction  . Tramadol Hcl Nausea And Vomiting    Antimicrobials this admission: Zosyn 10/13 >> Vancomycin 10/13 >> 10/19  Dose adjustments this admission: na  Microbiology results: 0/14 blood cx: ngtd x3 days 10/14 resp cx: MRSA 10/13 Urine: neg 10/14 RVP: neg 10/3 MRSA pcr pos  Thank you for allowing pharmacy to be a part of this patient's care.  Sloan Leiter, PharmD, BCPS Clinical Pharmacist [phone] 01/03/2016 12:51 PM

## 2016-01-04 ENCOUNTER — Ambulatory Visit: Payer: Medicare Other | Admitting: Nurse Practitioner

## 2016-01-04 LAB — CBC WITH DIFFERENTIAL/PLATELET
BASOS ABS: 0 10*3/uL (ref 0.0–0.1)
Basophils Relative: 0 %
Eosinophils Absolute: 0 10*3/uL (ref 0.0–0.7)
Eosinophils Relative: 0 %
HEMATOCRIT: 31.2 % — AB (ref 36.0–46.0)
HEMOGLOBIN: 9.5 g/dL — AB (ref 12.0–15.0)
LYMPHS PCT: 9 %
Lymphs Abs: 1.3 10*3/uL (ref 0.7–4.0)
MCH: 27.9 pg (ref 26.0–34.0)
MCHC: 30.4 g/dL (ref 30.0–36.0)
MCV: 91.8 fL (ref 78.0–100.0)
Monocytes Absolute: 1.6 10*3/uL — ABNORMAL HIGH (ref 0.1–1.0)
Monocytes Relative: 11 %
NEUTROS ABS: 11.8 10*3/uL — AB (ref 1.7–7.7)
NEUTROS PCT: 80 %
Platelets: 354 10*3/uL (ref 150–400)
RBC: 3.4 MIL/uL — AB (ref 3.87–5.11)
RDW: 15.5 % (ref 11.5–15.5)
WBC: 14.8 10*3/uL — AB (ref 4.0–10.5)

## 2016-01-04 LAB — GLUCOSE, CAPILLARY
Glucose-Capillary: 97 mg/dL (ref 65–99)
Glucose-Capillary: 344 mg/dL — ABNORMAL HIGH (ref 65–99)
Glucose-Capillary: 440 mg/dL — ABNORMAL HIGH (ref 65–99)
Glucose-Capillary: 171 mg/dL — ABNORMAL HIGH (ref 65–99)

## 2016-01-04 LAB — RENAL FUNCTION PANEL
Albumin: 2.2 g/dL — ABNORMAL LOW (ref 3.5–5.0)
Anion gap: 7 (ref 5–15)
BUN: 14 mg/dL (ref 6–20)
CHLORIDE: 92 mmol/L — AB (ref 101–111)
CO2: 41 mmol/L — AB (ref 22–32)
Calcium: 8.7 mg/dL — ABNORMAL LOW (ref 8.9–10.3)
Creatinine, Ser: 0.76 mg/dL (ref 0.44–1.00)
GFR calc Af Amer: >60 mL/min (ref >60)
GFR calc non Af Amer: >60 mL/min (ref >60)
Glucose, Bld: 96 mg/dL (ref 65–99)
PHOSPHORUS: 2.7 mg/dL (ref 2.5–4.6)
Potassium: 3.2 mmol/L — ABNORMAL LOW (ref 3.5–5.1)
SODIUM: 140 mmol/L (ref 135–145)

## 2016-01-04 LAB — MAGNESIUM: Magnesium: 1.5 mg/dL — ABNORMAL LOW (ref 1.7–2.4)

## 2016-01-04 MED ORDER — ALPRAZOLAM 0.25 MG PO TABS
0.2500 mg | ORAL_TABLET | Freq: Once | ORAL | Status: AC
  Administered 2016-01-04: 0.25 mg via ORAL
  Filled 2016-01-04: qty 1

## 2016-01-04 MED ORDER — ENSURE ENLIVE PO LIQD
237.0000 mL | Freq: Three times a day (TID) | ORAL | Status: DC
  Administered 2016-01-04 – 2016-01-05 (×4): 237 mL via ORAL

## 2016-01-04 MED ORDER — INSULIN ASPART 100 UNIT/ML ~~LOC~~ SOLN
0.0000 [IU] | Freq: Three times a day (TID) | SUBCUTANEOUS | Status: DC
  Administered 2016-01-04: 3 [IU] via SUBCUTANEOUS
  Administered 2016-01-05: 2 [IU] via SUBCUTANEOUS
  Administered 2016-01-05: 1 [IU] via SUBCUTANEOUS

## 2016-01-04 NOTE — Progress Notes (Signed)
Pt transferred from 52M at 2130. Patient is A&O X 4 with periods of confusion.Vital signs stable. Pt husband and daughter at bedside. Telemetry box 5W17 applied and verified by Lauralyn Primes, Nurse Tech. Skin assessment completed and second verified by nurse Langley Adie. Bruises and skin tears noted on arms and legs bilaterally. Redness noted on patient's groin and buttocks. Patient sitting up in the bed and eating dinner. Will continue to monitor and treat per MD orders.    01/03/16 2130  Vitals  Temp 98 F (36.7 C)  Temp Source Oral  BP 137/77  MAP (mmHg) 91  BP Location Right Arm  BP Method Automatic  Patient Position (if appropriate) Lying  Pulse Rate 94  Pulse Rate Source Monitor  Resp 18  Oxygen Therapy  SpO2 95 %  O2 Device Nasal Cannula  O2 Flow Rate (L/min) 2 L/min  Height and Weight  Height 4\' 10"  (1.473 m)  Weight 39.8 kg (87 lb 12.8 oz)  BSA (Calculated - sq m) 1.28 sq meters  BMI (Calculated) 18.4  Weight in (lb) to have BMI = 25 119.4

## 2016-01-04 NOTE — Evaluation (Signed)
Occupational Therapy Evaluation Patient Details Name: [name] MRN: [id number] DOB: Jun 21, 1945 Today's Date: 01/04/2016    History of Present Illness [name] is a 70 yo female with PMH of COPD, hx of rheumatic fever, HTN, HLD, RA, PVD, Diastolic HF (EF [phone] [id number] in 2015), protein calorie malnutrition who presents with lethargy and AMS. She was recently hospitalized (10/3-10/9) for HCAP, mild COPD exacerbation and Acute Hypoxemic Respiratory Failure.  Patient was intubated until 10/17, though to have end-stage COPD and severe MRSA pneumonia.   Clinical Impression   Pt with decline in function and safety with ADLs and ADL mobility with decreased strength, balance, endurance and cognition. Pt with Poor appetite and fatigues easily. Pt would benefit from acute OT services to address impairments to increase level of function and safety    Follow Up Recommendations  SNF;Supervision/Assistance - 24 hour    Equipment Recommendations  Other (comment) (TBD)    Recommendations for Other Services       Precautions / Restrictions Precautions Precautions: Fall Restrictions Weight Bearing Restrictions: No      Mobility Bed Mobility Overal bed mobility: Needs Assistance Bed Mobility: Rolling;Sidelying to Sit;Sit to Supine;Supine to Sit Rolling: Mod assist Sidelying to sit: Max assist Supine to sit: Mod assist;Max assist Sit to supine: Mod assist   General bed mobility comments: used rails for rolling  Transfers                 General transfer comment: did not perform    Balance Overall balance assessment: Needs assistance Sitting-balance support: Feet unsupported;No upper extremity supported Sitting balance-Leahy Scale: Fair Sitting balance - Comments: sat EOB about 15 minutes working on preparing and eating breakfast     Standing balance-Leahy Scale: Poor                              ADL Overall ADL's : Needs assistance/impaired      Grooming: Wash/dry hands;Wash/dry face;Sitting;Minimal assistance;Brushing hair Grooming Details (indicate cue type and reason): weakness, decreased balance Upper Body Bathing: Minimal assitance;Sitting Upper Body Bathing Details (indicate cue type and reason): weakness, decreased balance Lower Body Bathing: Maximal assistance   Upper Body Dressing : Sitting;Minimal assistance Upper Body Dressing Details (indicate cue type and reason): weakness, decreased balance Lower Body Dressing: Total assistance     Toilet Transfer Details (indicate cue type and reason): did not perform, may need +2 asssist due to weakness and decreased balance Toileting- Clothing Manipulation and Hygiene: Bed level;Total assistance               Vision  no change from baseline              Pertinent Vitals/Pain Pain Assessment: 0-10 Pain Score: 4  Faces Pain Scale: Hurts little more Pain Location: bottom Pain Descriptors / Indicators: Sore;Burning Pain Intervention(s): Limited activity within patient's tolerance;Monitored during session     Hand Dominance Right   Extremity/Trunk Assessment Upper Extremity Assessment Upper Extremity Assessment: Generalized weakness   Lower Extremity Assessment Lower Extremity Assessment: Defer to PT evaluation RLE Deficits / Details: AROM WFL, (though easily curls up into fetal position), strength 3+/5 LLE Deficits / Details: AROM WFL, (though easily curls up into fetal position), strength 3+/5   Cervical / Trunk Assessment Cervical / Trunk Assessment: Kyphotic   Communication Communication Communication: No difficulties   Cognition Arousal/Alertness: Awake/alert Behavior During Therapy: WFL for tasks assessed/performed Overall Cognitive Status: No family/caregiver present to determine  baseline cognitive functioning Area of Impairment: Following commands;Memory Orientation Level: Disoriented to;Time;Situation   Memory: Decreased short-term  memory Following Commands: Follows one step commands with increased time       General Comments: Patient initially groggy and difficult to rouse, then after rolling for hygiene due to incontinent of liquid bowel and urine, was more alert and able to respond to questions   General Comments   pt pleasant and cooperative, confused                 Home Living Family/patient expects to be discharged to:: Private residence Living Arrangements: Spouse/significant other Available Help at Discharge: Family;Available 24 hours/day Type of Home: Mobile home Home Access: Stairs to enter;Ramped entrance Entrance Stairs-Number of Steps: 2 in the front and 2 in the back.    Home Layout: One level     Bathroom Shower/Tub: Walk-in shower;Tub only   Bathroom Toilet: Standard     Home Equipment: Cane - single point;Walker - 4 wheels;Bedside commode;Shower seat   Additional Comments: reports also having w/c and hospital bed from previous illnesses, may be at her son's home      Prior Functioning/Environment Level of Independence: Needs assistance  Gait / Transfers Assistance Needed: reports walking with walker, but having lots of difficulty lately walking (note per chart recent frequent falls) ADL's / Homemaking Assistance Needed: states mostly sponge bathes unaided            OT Problem List: Impaired balance (sitting and/or standing);Decreased cognition;Pain;Decreased activity tolerance;Decreased knowledge of use of DME or AE   OT Treatment/Interventions:      OT Goals(Current goals can be found in the care plan section) Acute Rehab OT Goals Patient Stated Goal: To get back home OT Goal Formulation: With patient Time For Goal Achievement: 01/11/16 ADL Goals Pt Will Perform Grooming: with min guard assist;with supervision;sitting Pt Will Perform Upper Body Bathing: with min guard assist;with supervision;sitting Pt Will Perform Lower Body Bathing: with mod assist;sitting/lateral  leans Pt Will Perform Upper Body Dressing: with min guard assist;with supervision;sitting Pt Will Transfer to Toilet: with mod assist;with +2 assist;bedside commode Additional ADL Goal #1: Pt will sit EOB with min guard A for balance x 15 minutes for ADLs  OT Frequency:     Barriers to D/C:  decreased caregiver support                        End of Session    Activity Tolerance: Patient limited by fatigue;Patient limited by pain Patient left: in bed;with call bell/phone within reach;with bed alarm set   Time: [id number] OT Time Calculation (min): 26 min Charges:  OT General Charges $OT Visit: 1 Procedure OT Evaluation $OT Eval Moderate Complexity: 1 Procedure OT Treatments $Therapeutic Activity: 8-22 mins G-Codes:    Britt Bottom 01/04/2016, 2:46 PM

## 2016-01-04 NOTE — Progress Notes (Signed)
Nutrition Follow-up  DOCUMENTATION CODES:   Underweight, Severe malnutrition in context of chronic illness  INTERVENTION:   -Ensure Enlive po TID, each supplement provides 350 kcal and 20 grams of protein  NUTRITION DIAGNOSIS:   Malnutrition related to chronic illness as evidenced by severe depletion of muscle mass, energy intake < or equal to 75% for > or equal to 1 month, percent weight loss.  Ongoing  GOAL:   Patient will meet greater than or equal to 90% of their needs  Progressing  MONITOR:   PO intake, Supplement acceptance, Labs, Weight trends, Skin, I & O's  REASON FOR ASSESSMENT:   Ventilator    ASSESSMENT:   70 y/o female PMHx anxiety, afib, HTN, Chronic respiratory failure, COPD on home 02, CHF. Was recently admitted and discharged on 10/9 from [place] for PNA. Represents with AMS and jerking movement. Intubated on arrival.   Pt extubated on 10/171/17; transferred from ICU to medical floor on 01/04/16.   Pt sleeping soundly at time of visit. Noted breakfast tray in from of pt on tray table; pt consumed only 75% of sausage patty off tray. Meal completion variable (PO: 30-75%). Given COPD and malnutrition, pt would benefit from addition of nutritional supplements; RD to order.   Reviewed CWOCN note from 01/01/16; pt with multiple partial thickness pressure injuries to bilateral legs.   Palliative care team consult pending for goals of care.   Labs reviewed: K: 3.2, CBGS: 90-440.  Diet Order:  Diet regular Room service appropriate? Yes; Fluid consistency: Thin  Skin:  Wound (see comment) (multiple partial thickness injuries to bilateral legs)  Last BM:  01/04/16  Height:   Ht Readings from Last 1 Encounters:  01/03/16 4\' 10"  (1.473 m)    Weight:   Wt Readings from Last 1 Encounters:  01/03/16 87 lb 12.8 oz (39.8 kg)    Ideal Body Weight:  50 kg  BMI:  Body mass index is 18.35 kg/m.  Estimated Nutritional Needs:   Kcal:  1200-1400  Protein:   50-65 grams  Fluid:  1.2-1.4 L  EDUCATION NEEDS:   No education needs identified at this time  Crystalina Stodghill A. Jimmye Norman, RD, LDN, CDE Pager: [phone] After hours Pager: [phone]

## 2016-01-04 NOTE — Progress Notes (Addendum)
PROGRESS NOTE    [name]  [id number] DOB: 05/17/1945 DOA: 12/29/2015 PCP: Chevis Pretty, FNP  Brief Narrative: 70 year old female with past history of COPD, chronic respiratory failure 2 L, 2, severe protein calorie malnutrition, chronic diastolic CHF was admitted to ICU one week ago with acute on chronic respiratory failure from COPD exacerbation and MRSA pneumonia she is status post VDRF, respiratory status improved and transferred to [facility] today 10/19  Assessment & Plan:   1. Acute on chronic hypoxic and hypercarbic respiratory failure -s/p VDRF, extubated 2 days ago -Due to COPD exacerbation and MRSA pneumonia  -Wean steroids, nebs, stop vancomycin after today's dose day 7  -Ambulate, nebs, primary toilet  -Palliative medicine consulted by PCCM prior to transfer to the floor today  2. MRSA pneumonia  -Will complete day 7 of IV vancomycin after today's dose   3. Chronic diastolic CHF -Appears euvolemic, continue by mouth Lasix  4. Severe protein calorie malnutrition -Supplements as tolerated  5. Hypovolemic shock -Resolved with hydration  6. Hypertension Resume it metoprolol-  DVT prophylaxis: Lovenox, Code Status: (Full/Partial - specify details) Family Communication: (Specify name, relationship & date discussed. NO "discussed with patient") Disposition Plan: (specify when and where you expect patient to be discharged)   Consultants:   Pulmonary critical care   Antimicrobials: Vancomycin day 7   Subjective: Feels okay, no events overnight  Objective: Vitals:   01/04/16 0155 01/04/16 0706 01/04/16 0732 01/04/16 0733  BP:  (!) 177/81    Pulse:  (!) 50    Resp:  20    Temp:  97.6 F (36.4 C)    TempSrc:  Oral    SpO2: 94% 91% 100% 100%  Weight:      Height:        Intake/Output Summary (Last 24 hours) at 01/04/16 1204 Last data filed at 01/04/16 1018  Gross per 24 hour  Intake              650 ml  Output                4 ml  Net               646 ml   Filed Weights   01/02/16 0500 01/03/16 0400 01/03/16 2130  Weight: 43.3 kg (95 lb 7.4 oz) 41.3 kg (91 lb 0.8 oz) 39.8 kg (87 lb 12.8 oz)    Examination:  General exam: Frail frail, elderly, chronically ill-appearing, cachectic  Respiratory system: Clear to auscultation. Respiratory effort normal. Cardiovascular system: S1 & S2 heard, RRR. No JVD, murmurs, rubs, gallops or clicks. No pedal edema. Gastrointestinal system: Abdomen is nondistended, soft and nontender. No organomegaly or masses felt. Normal bowel sounds heard. Central nervous system: Alert and oriented. No focal neurological deficits. Extremities: Symmetric 5 x 5 power. Skin: No rashes, lesions or ulcers Psychiatry: Judgement and insight appear normal. Mood & affect appropriate.     Data Reviewed: I have personally reviewed following labs and imaging studies  CBC:  Recent Labs Lab 12/31/15 0240 01/01/16 0232 01/02/16 0219 01/03/16 0247 01/04/16 0535  WBC 8.3 23.7* 17.6* 17.4* 14.8*  NEUTROABS 7.8* 22.7* 16.8* 15.2* 11.8*  HGB 8.3* 8.6* 9.8* 10.2* 9.5*  HCT 27.5* 28.8* 31.8* 33.7* 31.2*  MCV 92.0 93.8 92.2 91.8 91.8  PLT 347 362 330 379 [id number]   Basic Metabolic Panel:  Recent Labs Lab 01/01/16 0232 01/01/16 0235 01/01/16 2047 01/02/16 0219 01/02/16 1652 01/03/16 0247 01/04/16 0535  NA  --  140  --  140 137 140 140  K  --  3.9  --  2.8* 4.7 3.6 3.2*  CL  --  106  --  99* 98* 97* 92*  CO2  --  29  --  33* 29 38* 41*  GLUCOSE  --  220*  --  166* 60* 81 96  BUN  --  13  --  11 9 7 14   CREATININE  --  0.60  --  0.63 0.56 0.62 0.76  CALCIUM  --  8.6*  --  8.3* 8.8* 9.2 8.7*  MG 1.4*  --  2.5* 2.1  --  1.4* 1.5*  PHOS  --  1.3* 3.0 2.6  --  2.0* 2.7   GFR: Estimated Creatinine Clearance: 40.5 mL/min (by C-G formula based on SCr of 0.76 mg/dL). Liver Function Tests:  Recent Labs Lab 12/29/15 1905 12/30/15 0450 12/31/15 0240 01/01/16 0235 01/02/16 0219 01/03/16 0247  01/04/16 0535  AST 20 16  --   --   --   --   --   ALT 14 12*  --   --   --   --   --   ALKPHOS 79 61  --   --   --   --   --   BILITOT 0.5 0.6  --   --   --   --   --   PROT 7.0 5.5*  --   --   --   --   --   ALBUMIN 2.4* 1.8* 1.8* 1.9* 2.1* 2.2* 2.2*   No results for input(s): LIPASE, AMYLASE in the last 168 hours. No results for input(s): AMMONIA in the last 168 hours. Coagulation Profile: No results for input(s): INR, PROTIME in the last 168 hours. Cardiac Enzymes:  Recent Labs Lab 12/30/15 0450  TROPONINI <0.03   BNP (last 3 results) No results for input(s): PROBNP in the last 8760 hours. HbA1C: No results for input(s): HGBA1C in the last 72 hours. CBG:  Recent Labs Lab 01/03/16 0803 01/03/16 1135 01/03/16 1532 01/04/16 0838 01/04/16 1158  GLUCAP 80 121* 90 97 440*   Lipid Profile: No results for input(s): CHOL, HDL, LDLCALC, TRIG, CHOLHDL, LDLDIRECT in the last 72 hours. Thyroid Function Tests: No results for input(s): TSH, T4TOTAL, FREET4, T3FREE, THYROIDAB in the last 72 hours. Anemia Panel: No results for input(s): VITAMINB12, FOLATE, FERRITIN, TIBC, IRON, RETICCTPCT in the last 72 hours. Urine analysis:    Component Value Date/Time   COLORURINE YELLOW 12/29/2015 1918   APPEARANCEUR CLEAR 12/29/2015 1918   LABSPEC 1.010 12/29/2015 1918   PHURINE 6.5 12/29/2015 1918   GLUCOSEU NEGATIVE 12/29/2015 1918   HGBUR NEGATIVE 12/29/2015 1918   BILIRUBINUR NEGATIVE 12/29/2015 1918   KETONESUR NEGATIVE 12/29/2015 1918   PROTEINUR NEGATIVE 12/29/2015 1918   UROBILINOGEN 0.2 07/29/2013 1915   NITRITE NEGATIVE 12/29/2015 1918   LEUKOCYTESUR NEGATIVE 12/29/2015 1918   Sepsis Labs: @LABRCNTIP (procalcitonin:4,lacticidven:4)  ) Recent Results (from the past 240 hour(s))  Culture, blood (routine x 2)     Status: None   Collection Time: 12/29/15  7:05 PM  Result Value Ref Range Status   Specimen Description BLOOD LEFT ARM DRAWN BY RN  Final   Special Requests  BOTTLES DRAWN AEROBIC AND ANAEROBIC 5CC EACH  Final   Culture NO GROWTH 5 DAYS  Final   Report Status 01/03/2016 FINAL  Final  Urine culture     Status: None   Collection Time: 12/29/15  7:18 PM  Result Value Ref Range Status  Specimen Description URINE, CLEAN CATCH  Final   Special Requests NONE  Final   Culture NO GROWTH Performed at [facility]   Final   Report Status 01/01/2016 FINAL  Final  Culture, blood (routine x 2)     Status: None   Collection Time: 12/29/15  7:42 PM  Result Value Ref Range Status   Specimen Description BLOOD RIGHT HAND  Final   Special Requests   Final    BOTTLES DRAWN AEROBIC AND ANAEROBIC AER 6CC ANA 4CC   Culture NO GROWTH 5 DAYS  Final   Report Status 01/03/2016 FINAL  Final  Culture, respiratory (NON-Expectorated)     Status: None   Collection Time: 12/30/15  6:00 AM  Result Value Ref Range Status   Specimen Description TRACHEAL ASPIRATE  Final   Special Requests NONE  Final   Gram Stain   Final    ABUNDANT WBC PRESENT, PREDOMINANTLY PMN NO ORGANISMS SEEN    Culture   Final    RARE METHICILLIN RESISTANT STAPHYLOCOCCUS AUREUS RARE CANDIDA ALBICANS    Report Status 01/02/2016 FINAL  Final   Organism ID, Bacteria METHICILLIN RESISTANT STAPHYLOCOCCUS AUREUS  Final      Susceptibility   Methicillin resistant staphylococcus aureus - MIC*    CIPROFLOXACIN >=8 RESISTANT Resistant     ERYTHROMYCIN >=8 RESISTANT Resistant     GENTAMICIN <=0.5 SENSITIVE Sensitive     OXACILLIN >=4 RESISTANT Resistant     TETRACYCLINE <=1 SENSITIVE Sensitive     VANCOMYCIN 1 SENSITIVE Sensitive     TRIMETH/SULFA <=10 SENSITIVE Sensitive     CLINDAMYCIN >=8 RESISTANT Resistant     RIFAMPIN <=0.5 SENSITIVE Sensitive     Inducible Clindamycin NEGATIVE Sensitive     * RARE METHICILLIN RESISTANT STAPHYLOCOCCUS AUREUS  Respiratory Panel by PCR     Status: None   Collection Time: 12/30/15  2:25 PM  Result Value Ref Range Status   Adenovirus NOT DETECTED NOT  DETECTED Final   Coronavirus 229E NOT DETECTED NOT DETECTED Final   Coronavirus HKU1 NOT DETECTED NOT DETECTED Final   Coronavirus NL63 NOT DETECTED NOT DETECTED Final   Coronavirus OC43 NOT DETECTED NOT DETECTED Final   Metapneumovirus NOT DETECTED NOT DETECTED Final   Rhinovirus / Enterovirus NOT DETECTED NOT DETECTED Final   Influenza A NOT DETECTED NOT DETECTED Final   Influenza B NOT DETECTED NOT DETECTED Final   Parainfluenza Virus 1 NOT DETECTED NOT DETECTED Final   Parainfluenza Virus 2 NOT DETECTED NOT DETECTED Final   Parainfluenza Virus 3 NOT DETECTED NOT DETECTED Final   Parainfluenza Virus 4 NOT DETECTED NOT DETECTED Final   Respiratory Syncytial Virus NOT DETECTED NOT DETECTED Final   Bordetella pertussis NOT DETECTED NOT DETECTED Final   Chlamydophila pneumoniae NOT DETECTED NOT DETECTED Final   Mycoplasma pneumoniae NOT DETECTED NOT DETECTED Final         Radiology Studies: No results found.      Scheduled Meds: . budesonide (PULMICORT) nebulizer solution  0.25 mg Nebulization BID  . Chlorhexidine Gluconate Cloth  6 each Topical Q0600  . enoxaparin (LOVENOX) injection  30 mg Subcutaneous Q24H  . famotidine  20 mg Oral Daily  . fluconazole  150 mg Oral Q72H  . folic acid  1 mg Oral Daily  . furosemide  20 mg Oral Daily  . hydrocortisone cream   Topical BID  . ipratropium-albuterol  3 mL Nebulization Q6H  . mouth rinse  15 mL Mouth Rinse BID  . metoprolol tartrate  50 mg Oral BID  . mupirocin ointment  1 application Nasal BID  . predniSONE  50 mg Oral Q breakfast  . thiamine  100 mg Oral Daily   Or  . thiamine  100 mg Intravenous Daily  . vancomycin  1,000 mg Intravenous Q24H   Continuous Infusions:    LOS: 6 days    Time spent: 108min    Domenic Polite, MD [facility] about Pager [phone]  If 7PM-7AM, please contact night-coverage [email or web address] Password [email or web address] 01/04/2016, 12:04 PM

## 2016-01-04 NOTE — Evaluation (Signed)
Physical Therapy Evaluation Patient Details Name: NATALE MELGAREJO MRN: [id number] DOB: 1945-10-15 Today's Date: 01/04/2016   History of Present Illness  Ms. Decarvalho is a 70 yo female with PMH of COPD, hx of rheumatic fever, HTN, HLD, RA, PVD, Diastolic HF (EF [id number] in 2015), protein calorie malnutrition who presents with lethargy and AMS. She was recently hospitalized (10/3-10/9) for HCAP, mild COPD exacerbation and Acute Hypoxemic Respiratory Failure.  Patient was intubated until 10/17, though to have end-stage COPD and severe MRSA pneumonia.  Clinical Impression  Patient presents with decreased independence with mobility due to severe deconditioning and weakness in addition to issues noted in PT problem list.  She will benefit from skilled PT in the acute setting to allow return home with family support following SNF level rehab stay.      Follow Up Recommendations SNF    Equipment Recommendations  None recommended by PT    Recommendations for Other Services       Precautions / Restrictions Precautions Precautions: Fall      Mobility  Bed Mobility Overal bed mobility: Needs Assistance Bed Mobility: Rolling;Sidelying to Sit;Sit to Supine Rolling: Mod assist Sidelying to sit: Max assist Supine to sit: Mod assist;Max assist Sit to supine: Mod assist   General bed mobility comments: rolling for hygiene over multiple soiled linens, assist for feet off bed and to lift trunk upright  Transfers                 General transfer comment: NT due to frequent stools (I cleaned her twice and per RN already cleaned twice this shift)  Ambulation/Gait                Stairs            Wheelchair Mobility    Modified Rankin (Stroke Patients Only)       Balance Overall balance assessment: Needs assistance Sitting-balance support: Feet unsupported;No upper extremity supported Sitting balance-Leahy Scale: Good Sitting balance - Comments: sat EOB about 15  minutes working on preparing and eating breakfast                                     Pertinent Vitals/Pain Pain Assessment: Faces Faces Pain Scale: Hurts little more Pain Location: sore on buttocks Pain Descriptors / Indicators: Burning Pain Intervention(s): Monitored during session;Repositioned;Other (comment) (cleansed and applied cream)    Home Living Family/patient expects to be discharged to:: Private residence Living Arrangements: Spouse/significant other Available Help at Discharge: Family;Available 24 hours/day Type of Home: Mobile home Home Access: Stairs to enter;Ramped entrance   Entrance Stairs-Number of Steps: 2 in the front and 2 in the back.  Home Layout: One level Home Equipment: Cane - single point;Walker - 4 wheels;Bedside commode;Shower seat Additional Comments: reports also having w/c and hospital bed from previous illnesses, may be at her son's home    Prior Function Level of Independence: Needs assistance   Gait / Transfers Assistance Needed: reports walking with walker, but having lots of difficulty lately walking (note per chart recent frequent falls)  ADL's / Homemaking Assistance Needed: states mostly sponge bathes unaided        Hand Dominance        Extremity/Trunk Assessment   Upper Extremity Assessment: Generalized weakness           Lower Extremity Assessment: RLE deficits/detail;LLE deficits/detail RLE Deficits / Details: AROM WFL, (though easily curls  up into fetal position), strength 3+/5 LLE Deficits / Details: AROM WFL, (though easily curls up into fetal position), strength 3+/5  Cervical / Trunk Assessment: Kyphotic  Communication   Communication: No difficulties  Cognition Arousal/Alertness: Awake/alert Behavior During Therapy: WFL for tasks assessed/performed Overall Cognitive Status: No family/caregiver present to determine baseline cognitive functioning Area of Impairment: Following  commands;Memory Orientation Level: Disoriented to;Time   Memory: Decreased short-term memory Following Commands: Follows one step commands with increased time       General Comments: Patient initially groggy and difficult to rouse, then after rolling for hygiene due to incontinent of liquid bowel and urine, was more alert and able to respond to questions    General Comments General comments (skin integrity, edema, etc.): patient with multiple bandages on LE's and UE's, noted eccymosis on both feet and lower legs; pt denies pain    Exercises     Assessment/Plan    PT Assessment Patient needs continued PT services  PT Problem List Decreased strength;Decreased activity tolerance;Decreased balance;Decreased knowledge of use of DME;Cardiopulmonary status limiting activity;Decreased knowledge of precautions;Decreased mobility          PT Treatment Interventions DME instruction;Gait training;Functional mobility training;Therapeutic activities;Therapeutic exercise;Balance training;Patient/family education    PT Goals (Current goals can be found in the Care Plan section)  Acute Rehab PT Goals Patient Stated Goal: To get back home PT Goal Formulation: With patient Time For Goal Achievement: 01/18/16 Potential to Achieve Goals: Fair    Frequency Min 3X/week   Barriers to discharge        Co-evaluation               End of Session Equipment Utilized During Treatment: Oxygen Activity Tolerance: Patient tolerated treatment well Patient left: in bed;with call bell/phone within reach;with bed alarm set Nurse Communication: Mobility status         Time: [id number] PT Time Calculation (min) (ACUTE ONLY): 48 min   Charges:   PT Evaluation $PT Eval Moderate Complexity: 1 Procedure PT Treatments $Therapeutic Activity: 23-37 mins   PT G CodesReginia Naas 01/28/2016, 11:12 AM Magda Kiel, PT [phone] 01/28/2016

## 2016-01-04 NOTE — Care Management Important Message (Signed)
Important Message  Patient Details  Name: TRINITEE CIARROCCHI MRN: [id number] Date of Birth: [birth year]   Medicare Important Message Given:  Yes    Carles Collet, RN 01/04/2016, 11:59 AM

## 2016-01-04 NOTE — Progress Notes (Signed)
CSW spoke with pt, pt spouse, and pt dtr at bedside concerning plan for patient.  Pt was recently at rehab facility and had very negative experience and now pt dtr feels strongly that they would rather try to manage her needs at home.  Dtr states she lives next door and husband is at home full time with pt in case on concerns.  Palliative care team discussing goals of care today  CSW signing off for now- please reconsult if pt and family reconsider SNF recommendation.  Jorge Ny, LCSW Clinical Social Worker [phone]

## 2016-01-05 ENCOUNTER — Encounter (HOSPITAL_COMMUNITY): Payer: Self-pay | Admitting: General Practice

## 2016-01-05 LAB — BASIC METABOLIC PANEL
Anion gap: 10 (ref 5–15)
BUN: 15 mg/dL (ref 6–20)
CALCIUM: 9.2 mg/dL (ref 8.9–10.3)
CHLORIDE: 88 mmol/L — AB (ref 101–111)
CO2: 41 mmol/L — ABNORMAL HIGH (ref 22–32)
CREATININE: 0.93 mg/dL (ref 0.44–1.00)
GFR calc non Af Amer: >60 mL/min (ref >60)
Glucose, Bld: 206 mg/dL — ABNORMAL HIGH (ref 65–99)
Potassium: 3.4 mmol/L — ABNORMAL LOW (ref 3.5–5.1)
SODIUM: 139 mmol/L (ref 135–145)

## 2016-01-05 LAB — CBC WITH DIFFERENTIAL/PLATELET
BASOS ABS: 0 10*3/uL (ref 0.0–0.1)
BASOS PCT: 0 %
EOS ABS: 0 10*3/uL (ref 0.0–0.7)
Eosinophils Relative: 0 %
HEMATOCRIT: 33.3 % — AB (ref 36.0–46.0)
Hemoglobin: 10 g/dL — ABNORMAL LOW (ref 12.0–15.0)
Lymphocytes Relative: 16 %
Lymphs Abs: 2.3 10*3/uL (ref 0.7–4.0)
MCH: 28.1 pg (ref 26.0–34.0)
MCHC: 30 g/dL (ref 30.0–36.0)
MCV: 93.5 fL (ref 78.0–100.0)
MONO ABS: 1.4 10*3/uL — AB (ref 0.1–1.0)
MONOS PCT: 10 %
NEUTROS ABS: 10.2 10*3/uL — AB (ref 1.7–7.7)
Neutrophils Relative %: 74 %
PLATELETS: 251 10*3/uL (ref 150–400)
RBC: 3.56 MIL/uL — ABNORMAL LOW (ref 3.87–5.11)
RDW: 15.2 % (ref 11.5–15.5)
WBC: 13.9 10*3/uL — ABNORMAL HIGH (ref 4.0–10.5)

## 2016-01-05 LAB — RENAL FUNCTION PANEL
ALBUMIN: 2.4 g/dL — AB (ref 3.5–5.0)
ANION GAP: 10 (ref 5–15)
BUN: 15 mg/dL (ref 6–20)
CALCIUM: 9.1 mg/dL (ref 8.9–10.3)
CO2: 41 mmol/L — ABNORMAL HIGH (ref 22–32)
CREATININE: 0.65 mg/dL (ref 0.44–1.00)
Chloride: 92 mmol/L — ABNORMAL LOW (ref 101–111)
GFR calc non Af Amer: >60 mL/min (ref >60)
GLUCOSE: 90 mg/dL (ref 65–99)
PHOSPHORUS: 2.3 mg/dL — AB (ref 2.5–4.6)
Potassium: 2.6 mmol/L — CL (ref 3.5–5.1)
SODIUM: 143 mmol/L (ref 135–145)

## 2016-01-05 LAB — MAGNESIUM: MAGNESIUM: 1.2 mg/dL — AB (ref 1.7–2.4)

## 2016-01-05 LAB — GLUCOSE, CAPILLARY
GLUCOSE-CAPILLARY: 172 mg/dL — AB (ref 65–99)
GLUCOSE-CAPILLARY: 112 mg/dL — AB (ref 65–99)
Glucose-Capillary: 134 mg/dL — ABNORMAL HIGH (ref 65–99)

## 2016-01-05 MED ORDER — MAGNESIUM SULFATE 2 GM/50ML IV SOLN
2.0000 g | Freq: Once | INTRAVENOUS | Status: AC
  Administered 2016-01-05: 2 g via INTRAVENOUS
  Filled 2016-01-05: qty 50

## 2016-01-05 MED ORDER — PREDNISONE 20 MG PO TABS: ORAL_TABLET | ORAL | 0 refills | Status: DC

## 2016-01-05 MED ORDER — POTASSIUM CHLORIDE CRYS ER 20 MEQ PO TBCR
60.0000 meq | EXTENDED_RELEASE_TABLET | ORAL | Status: AC
  Administered 2016-01-05 (×2): 60 meq via ORAL
  Filled 2016-01-05: qty 3

## 2016-01-05 MED ORDER — POTASSIUM CHLORIDE CRYS ER 20 MEQ PO TBCR: 60.0000 meq | EXTENDED_RELEASE_TABLET | Freq: Once | ORAL | Status: DC

## 2016-01-05 MED ORDER — POTASSIUM CHLORIDE CRYS ER 20 MEQ PO TBCR: 40.0000 meq | EXTENDED_RELEASE_TABLET | Freq: Every day | ORAL | 0 refills | Status: AC

## 2016-01-05 NOTE — Care Management Note (Addendum)
Case Management Note  Patient Details  Name: [name] MRN: [id number] Date of Birth: 03-24-1945  Subjective/Objective:                 Patient to resume HH through [facility] with [facility] PT RN, referral placed to [facility] clinical liaison for DC today. Patient active with [facility] for oxygen. Notified Brenton Grills, DME liaison to request portable tank for transport home, and more efficient portable tank for use after discharge to get back and forth from MD appointment. Spoke with patient's daughter over the phone. She states patient has been approved by [facility] for 36 hours of HHA care. CM relayed this to [facility] liaison. LM with [facility] for dispo planning.   Action/Plan:  Anticipate DC to home w/ HH later today after BMP.  FAX DC [facility] TO [place] [id number]  Expected Discharge Date:                  Expected Discharge Plan:  [place]  In-House Referral:  Clinical Social Work  Discharge planning Services  CM Consult  Post Acute Care Choice:  Home Health, Resumption of Svcs/PTA Provider Choice offered to:  Patient, Adult Children  DME Arranged:    DME Agency:     HH Arranged:  RN, PT [place] Agency:  [place] (now [facility])  Status of Service:  Completed, signed off  If discussed at [employer] of Stay Meetings, dates discussed:    Additional Comments:  Carles Collet, RN 01/05/2016, 11:17 AM

## 2016-01-05 NOTE — Consult Note (Signed)
   [facility] Inpatient Consult   01/05/2016  [name] 11/13/1945 [id number]  Patient screened for re-admission and has been out reached by [facility] at last admission.  Came to the bedside and chart reviewed.  Chart review reveals  70 year old female with past history of COPD, chronic respiratory failure 2 L, 2, severe protein calorie malnutrition, chronic diastolic HF was admitted to ICU one week ago with acute on chronic respiratory failure from COPD exacerbation and MRSA pneumonia she is status post VDRF, respiratory status improved per MD notes..  Met with the patient regarding the benefits of [facility] services.Patient is somewhat confused and states that her husband takes care of all of her medical needs.  States that her daughter is making arrangements for her and she doesn't want "too much stuff to get all mixed up." Explained that [facility] is a covered benefit of insurance. Review information for [facility] Care [facility] and a brochure was provided with contact information.  Noted arrangements with [facility] by inpatient RNCM. Explained that [facility] does not interfere with or replace any services arranged by the inpatient care management staff.  Patient declined services with [facility], at this time.   Natividad Brood, RN BSN [facility] Liaison  [phone] business mobile phone Toll free office [phone]    .

## 2016-01-05 NOTE — Discharge Summary (Signed)
Physician Discharge Summary  [name] [id number] DOB: Jan 17, 1946 DOA: 12/29/2015  PCP: Chevis Pretty, FNP  Admit date: 12/29/2015 Discharge date: 01/05/2016  Time spent: 35 minutes  Recommendations for Outpatient Follow-up:  1. PCP Ronnald Collum in 1 week, please check Bmet at Follow up   Discharge Diagnoses:    Acute on chronic resp failure   Respiratory failure ([place])   Seizure ([place])   Severe protein calorie malnutrition  Discharge Condition: stable  Diet recommendation: low sodium  Filed Weights   01/03/16 0400 01/03/16 2130 01/05/16 0457  Weight: 41.3 kg (91 lb 0.8 oz) 39.8 kg (87 lb 12.8 oz) 39.2 kg (86 lb 6.7 oz)    History of present illness:  70 year old female with past history of COPD, chronic respiratory failure 2 L, 2, severe protein calorie malnutrition, chronic diastolic CHF was admitted to ICU one week ago with acute on chronic respiratory failure   Hospital Course:  1. Acute on chronic hypoxic and hypercarbic respiratory failure -improved -s/p VDRF, extubated 3 days ago -Due to COPD exacerbation and MRSA pneumonia  -Weaned steroids, nebs, stopped vancomycin after 7days -Ambulate, nebs, primary toilet  -Palliative medicine consulted by PCCM, family wants full scope of treatment at this time  2. MRSA pneumonia  -completed 7days of IV Vancomycin  3. Chronic diastolic CHF -Appears euvolemic, continue by mouth Lasix 20mg  daily with KCl  4. Severe protein calorie malnutrition -Supplements as tolerated  5. Hypovolemic shock -Resolved with hydration  6. Hypertension -Resumed metoprolol-  7. Hypokalemia -due to diuretics, replaced today, repeat pending at the time of discharge  Consultations:  PCCM  Discharge Exam: Vitals:   01/05/16 0457 01/05/16 0848  BP: (!) 156/72 (!) 140/57  Pulse: 67 83  Resp: 18   Temp: 97.9 F (36.6 C)     General: AAOx3, frail Cardiovascular: S1S2/RRR Respiratory: CTAB  Discharge  Instructions    Current Discharge Medication List    START taking these medications   Details  potassium chloride SA (K-DUR,KLOR-CON) 20 MEQ tablet Take 2 tablets (40 mEq total) by mouth daily. Qty: 30 tablet, Refills: 0    predniSONE (DELTASONE) 20 MG tablet Take 40mg  for 2days then 20mg  for 2days then STOP Qty: 10 tablet, Refills: 0      CONTINUE these medications which have NOT CHANGED   Details  albuterol (PROAIR HFA) 108 (90 BASE) MCG/ACT inhaler INHALE 2 PUFFS INTO THE LUNGS EVERY 6 HOURS AS NEEDED FOR WHEEZING OR SHORTNESS OF BREATH Qty: 1 Inhaler, Refills: 2    albuterol (PROVENTIL) (2.5 MG/3ML) 0.083% nebulizer solution Take 2.5 mg by nebulization every morning.    ALPRAZolam (XANAX) 0.5 MG tablet Take 1 tablet (0.5 mg total) by mouth 2 (two) times daily as needed for anxiety. Qty: 60 tablet, Refills: 1   Associated Diagnoses: GAD (generalized anxiety disorder)    amitriptyline (ELAVIL) 50 MG tablet Take 1 tablet by mouth at  bedtime Qty: 90 tablet, Refills: 1   Associated Diagnoses: Insomnia    aspirin 325 MG tablet Take 325 mg by mouth every evening.     buPROPion (WELLBUTRIN XL) 150 MG 24 hr tablet Take 1 tablet by mouth  daily Qty: 90 tablet, Refills: 0    cetirizine (ALL DAY ALLERGY) 10 MG tablet Take 10 mg by mouth at bedtime.     cholecalciferol (VITAMIN D) 1000 UNITS tablet Take 1,000 Units by mouth every morning.     EQL CALCIUM CITRATE/VITAMIN D PO Take 1 each by mouth 2 (two) times daily. Vitamin  D 500IU with Calcium Citrate 630mg   Chewables    Fluticasone Furoate-Vilanterol (BREO ELLIPTA) 100-25 MCG/INH AEPB Inhale 1 puff into the lungs daily.     furosemide (LASIX) 20 MG tablet Take 1 tablet by mouth  daily Qty: 90 tablet, Refills: 1    guaifenesin (MUCUS RELIEF) 400 MG TABS tablet Take 400 mg by mouth every morning.     HYDROcodone-acetaminophen (NORCO) 10-325 MG tablet Take 1 tablet by mouth 2 (two) times daily. Qty: 60 tablet, Refills: 0    Associated Diagnoses: Chronic back pain    metoprolol (LOPRESSOR) 50 MG tablet Take 1 tablet by mouth two  times daily Qty: 180 tablet, Refills: 1    tiotropium (SPIRIVA) 18 MCG inhalation capsule Place 1 capsule (18 mcg total) into inhaler and inhale every morning. Qty: 90 capsule, Refills: 0   Associated Diagnoses: COPD bronchitis    vitamin C (ASCORBIC ACID) 500 MG tablet Take 500 mg by mouth every morning.      STOP taking these medications     levofloxacin (LEVAQUIN) 750 MG tablet        Allergies  Allergen Reactions  . Celexa [Citalopram Hydrobromide] Other (See Comments)    "Jerky movements"  . Nickel     Unknown reaction  . Tramadol Hcl Nausea And Vomiting   Follow-up Information    [facility] .   Why:  Resume home health service RN and PT from prior to admission Contact information: [address] [place] [address] [phone]        [place], FNP. Schedule an appointment as soon as possible for a visit in 1 week(s).   Specialty:  Family Medicine Contact information: [address] [address] [phone]            The results of significant diagnostics from this hospitalization (including imaging, microbiology, ancillary and laboratory) are listed below for reference.    Significant Diagnostic Studies: Dg Chest 2 View  Result Date: 12/19/2015 CLINICAL DATA:  Recent episode of pneumonia. Patient reports feeling bad for the past several days. Cough and weakness. History of COPD, former smoker, atrial fibrillation, CHF, on home oxygen. EXAM: CHEST  2 VIEW COMPARISON:  PA and lateral chest x-ray of November 08, 2015 FINDINGS: There is extensive scarring which appears stable. A fibrocavitary lesion in the right apex is stable. There are mildly increased lung markings at both bases as compared to the previous study. The heart is normal in size. The central pulmonary vascularity is prominent but stable.  Retraction of the hilar structures superiorly is stable. There is calcification in the wall of the aortic arch. There is multilevel degenerative disc disease of the thoracic spine. IMPRESSION: Extensive chronic bronchitic changes with parenchymal scarring. Mildly increased density at the lung bases suggest superimposed pneumonia. No evidence of CHF. Followup PA and lateral chest X-ray is recommended in 3-4 weeks following trial of antibiotic therapy to ensure resolution and exclude underlying malignancy. Aortic atherosclerosis. Electronically Signed   By: David  Martinique M.D.   On: 12/19/2015 16:13   Ct Head Wo Contrast  Result Date: 12/29/2015 CLINICAL DATA:  Altered mental status EXAM: CT HEAD WITHOUT CONTRAST TECHNIQUE: Contiguous axial images were obtained from the base of the skull through the vertex without intravenous contrast. COMPARISON:  CT head 11/27/2015 FINDINGS: Brain: Image quality degraded by mild motion. Generalized atrophy stable. Chronic microvascular ischemic change the white matter, stable. Negative for acute infarct. Negative for acute hemorrhage or mass. No shift  of the midline structures. Vascular: No hyperdense vessel or unexpected calcification. Skull: Negative Sinuses/Orbits: Negative Other: None IMPRESSION: No acute abnormality. Atrophy and chronic microvascular ischemic changes are stable. Electronically Signed   By: Franchot Gallo M.D.   On: 12/29/2015 19:42   Ct Cervical Spine Wo Contrast  Result Date: 12/31/2015 CLINICAL DATA:  Recent fall. EXAM: CT CERVICAL SPINE WITHOUT CONTRAST TECHNIQUE: Multidetector CT imaging of the cervical spine was performed without intravenous contrast. Multiplanar CT image reconstructions were also generated. COMPARISON:  Recent chest x-rays. FINDINGS: Alignment: There is straightening of normal lordosis. 2 mm of anterior listhesis of C7 versus T1, thought to be degenerative. No traumatic malalignment. Skull base and vertebrae: No fractures. Soft  tissues and spinal canal: Shotty nodes in the superior mediastinum are likely reactive. Changes in the lungs described below. An ET tube and NG tube are identified flat the distal tips are not included on this study. No other soft tissue abnormalities are identified. Mild narrowing of the spinal canal at C5-6 measuring 9 mm in AP dimension. Disc levels: Multilevel degenerative changes most marked at C5-6 and C6-7. Upper chest: A large bulla is seen in the right upper lobe. Apical opacities were better assessed on recent chest x-rays and previous CT imaging. There is certainly a chronic component. An acute on chronic component cannot be excluded on these limited views. An ET tube is identified. The distal tip is not visualized. Other: No other abnormalities. IMPRESSION: 1. No fracture or traumatic malalignment. 2 mm of anterior listhesis of C7 versus T1 is thought to be degenerative. 2. Changes again seen in the lung apices, better assessed with recent chest x-rays. Electronically Signed   By: Dorise Bullion III M.D   On: 12/31/2015 14:13   Ct Chest High Resolution  Result Date: 01/01/2016 CLINICAL DATA:  [age]-year-old female with respiratory failure. EXAM: CT CHEST WITHOUT CONTRAST TECHNIQUE: Multidetector CT imaging of the chest was performed following the standard protocol without intravenous contrast. High resolution imaging of the lungs, as well as inspiratory and expiratory imaging, was performed. COMPARISON:  Multiple chest radiographs dating back to the chest CT dated 03/31/2014 FINDINGS: Evaluation of this exam is limited in the absence of intravenous contrast. Cardiovascular: There is diffuse atherosclerotic calcification of the thoracic aorta. There is no aneurysmal dilatation. There is atherosclerotic calcification of the origins of the great vessels of the aortic arch. The central pulmonary arteries are grossly unremarkable on this noncontrast study. There is no cardiomegaly or pericardial  effusion. There is coronary vascular calcification. There is hypoattenuation of the cardiac blood pool suggestive of a degree of anemia. Clinical correlation is recommended. Mediastinum/Nodes: Right paratracheal lymph node measures 14 mm in short axis. Evaluation for hilar adenopathy is limited on this noncontrast study. An enteric tube is seen with tip in the upper esophagus. Recommend advancement of the tube into the stomach. There is fluid within the mid esophagus. Lungs/Pleura: There is advanced emphysematous changes of the lungs with upper lobe scarring and biapical bulla. Areas of consolidative changes at the lung bases posteriorly are new from prior study and likely represent combination of small pleural effusions and infiltrate. Clinical correlation is recommended. There is a 3.6 x 0.9 cm consolidative area in the left lower lobe extending to the peripheral lung and the pleural surface which is new from prior study. There is a stable area of scarring in the right infrahilar region and right lung base anteriorly which is relatively similar to prior CT. There is no pneumothorax.  An endotracheal tube is noted with tip just above the carina. The central airways are patent. Upper Abdomen: The visualized upper abdomen appears unremarkable. Musculoskeletal: There is diffuse subcutaneous edema and anasarca, increased from prior study. No fluid collection. There is no axillary adenopathy. There is degenerative changes of the spine. No acute fracture. 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 There are small bilateral pleural effusions with consolidative changes of the posterior lung bases which are new from prior CT and concerning for superimposed pneumonia. Clinical correlation and follow-up recommended. No pneumothorax. Endotracheal tube at the level of the carina. Enteric tube is within the upper esophagus. Recommend repositioning advancement into the  stomach. Electronically Signed   By: Anner Crete M.D.   On: 01/01/2016 21:53   Dg Chest Port 1 View  Result Date: 01/02/2016 CLINICAL DATA:  Acute respiratory failure EXAM: PORTABLE CHEST 1 VIEW COMPARISON:  01/01/2016 FINDINGS: Cardiac shadow is stable. Endotracheal tube and nasogastric catheter are again noted in satisfactory position. Chronic biapical changes are seen as well as bibasilar changes similar to that seen on the prior CT examination. No new focal abnormality is noted. No bony abnormality is seen. IMPRESSION: Chronic changes in the apices bilaterally. Bibasilar acute changes superimposed over chronic fibrotic change. This is stable from the prior CT examination. Electronically Signed   By: Inez Catalina M.D.   On: 01/02/2016 07:57   Dg Chest Port 1 View  Result Date: 12/29/2015 CLINICAL DATA:  Status post intubation. EXAM: PORTABLE CHEST 1 VIEW COMPARISON:  12/21/2015. FINDINGS: 1915 hours. Endotracheal tube tip is approximately 5 cm above the base of the carina. Bilateral upper lobe scarring again noted. Hyperexpansion and diffuse underlying chronic interstitial lung disease, as before. Small bilateral pleural effusions noted. The cardiopericardial silhouette is within normal limits for size. Defibrillator pads overlie the lower chest. Telemetry leads overlie the chest. IMPRESSION: 1. Status post intubation with improved aeration at the lung bases. 2. Extensive parenchymal scarring in both upper lobes as before with underlying diffuse interstitial chronic lung disease. 3. Small bilateral pleural effusions. Electronically Signed   By: Misty Stanley M.D.   On: 12/29/2015 19:43   Dg Chest Port 1 View  Result Date: 12/21/2015 CLINICAL DATA:  Hypoxia. EXAM: PORTABLE CHEST 1 VIEW COMPARISON:  12/19/2015. FINDINGS: Normal sized heart. Biapical cavitation and scarring with volume loss without significant change. Mildly increased patchy opacity at both lung bases with small bilateral pleural  effusions currently demonstrated. Aortic arch calcifications. Calcifications in both distal rotator cuffs. IMPRESSION: 1. Mildly increased bibasilar pneumonia or alveolar edema. 2. Interval small bilateral pleural effusions. 3. Stable biapical cavitation and scarring. This can be seen with active or previous tuberculosis or fungal infection. Electronically Signed   By: Claudie Revering M.D.   On: 12/21/2015 15:52   Dg Abd Portable 1v  Result Date: 12/30/2015 CLINICAL DATA:  Orogastric tube placement.  Initial encounter. EXAM: PORTABLE ABDOMEN - 1 VIEW COMPARISON:  CT of the abdomen and pelvis from 03/31/2014 FINDINGS: The patient's enteric tube is noted ending overlying the body of the stomach. The visualized bowel gas pattern is unremarkable. Scattered air and stool filled loops of colon are seen; no abnormal dilatation of small bowel loops is seen to suggest small bowel obstruction. No free intra-abdominal air is identified, though evaluation for free air is limited on a single supine view. Mild degenerative change is noted at the lower lumbar spine; the sacroiliac joints are unremarkable in appearance. The patient's right  hip arthroplasty is incompletely imaged but appears grossly unremarkable. Small bilateral pleural effusions are again noted, with increasing right basilar airspace opacification. IMPRESSION: 1. Enteric tube noted ending overlying the body of the stomach. 2. Small bilateral pleural effusions, with increasing right basilar airspace opacification, possibly reflecting pneumonia. Electronically Signed   By: Garald Balding M.D.   On: 12/30/2015 03:01    Microbiology: Recent Results (from the past 240 hour(s))  Culture, blood (routine x 2)     Status: None   Collection Time: 12/29/15  7:05 PM  Result Value Ref Range Status   Specimen Description BLOOD LEFT ARM DRAWN BY RN  Final   Special Requests BOTTLES DRAWN AEROBIC AND ANAEROBIC 5CC EACH  Final   Culture NO GROWTH 5 DAYS  Final   Report  Status 01/03/2016 FINAL  Final  Urine culture     Status: None   Collection Time: 12/29/15  7:18 PM  Result Value Ref Range Status   Specimen Description URINE, CLEAN CATCH  Final   Special Requests NONE  Final   Culture NO GROWTH Performed at [facility]   Final   Report Status 01/01/2016 FINAL  Final  Culture, blood (routine x 2)     Status: None   Collection Time: 12/29/15  7:42 PM  Result Value Ref Range Status   Specimen Description BLOOD RIGHT HAND  Final   Special Requests   Final    BOTTLES DRAWN AEROBIC AND ANAEROBIC AER 6CC ANA 4CC   Culture NO GROWTH 5 DAYS  Final   Report Status 01/03/2016 FINAL  Final  Culture, respiratory (NON-Expectorated)     Status: None   Collection Time: 12/30/15  6:00 AM  Result Value Ref Range Status   Specimen Description TRACHEAL ASPIRATE  Final   Special Requests NONE  Final   Gram Stain   Final    ABUNDANT WBC PRESENT, PREDOMINANTLY PMN NO ORGANISMS SEEN    Culture   Final    RARE METHICILLIN RESISTANT STAPHYLOCOCCUS AUREUS RARE CANDIDA ALBICANS    Report Status 01/02/2016 FINAL  Final   Organism ID, Bacteria METHICILLIN RESISTANT STAPHYLOCOCCUS AUREUS  Final      Susceptibility   Methicillin resistant staphylococcus aureus - MIC*    CIPROFLOXACIN >=8 RESISTANT Resistant     ERYTHROMYCIN >=8 RESISTANT Resistant     GENTAMICIN <=0.5 SENSITIVE Sensitive     OXACILLIN >=4 RESISTANT Resistant     TETRACYCLINE <=1 SENSITIVE Sensitive     VANCOMYCIN 1 SENSITIVE Sensitive     TRIMETH/SULFA <=10 SENSITIVE Sensitive     CLINDAMYCIN >=8 RESISTANT Resistant     RIFAMPIN <=0.5 SENSITIVE Sensitive     Inducible Clindamycin NEGATIVE Sensitive     * RARE METHICILLIN RESISTANT STAPHYLOCOCCUS AUREUS  Respiratory Panel by PCR     Status: None   Collection Time: 12/30/15  2:25 PM  Result Value Ref Range Status   Adenovirus NOT DETECTED NOT DETECTED Final   Coronavirus 229E NOT DETECTED NOT DETECTED Final   Coronavirus HKU1 NOT DETECTED  NOT DETECTED Final   Coronavirus NL63 NOT DETECTED NOT DETECTED Final   Coronavirus OC43 NOT DETECTED NOT DETECTED Final   Metapneumovirus NOT DETECTED NOT DETECTED Final   Rhinovirus / Enterovirus NOT DETECTED NOT DETECTED Final   Influenza A NOT DETECTED NOT DETECTED Final   Influenza B NOT DETECTED NOT DETECTED Final   Parainfluenza Virus 1 NOT DETECTED NOT DETECTED Final   Parainfluenza Virus 2 NOT DETECTED NOT DETECTED Final   Parainfluenza  Virus 3 NOT DETECTED NOT DETECTED Final   Parainfluenza Virus 4 NOT DETECTED NOT DETECTED Final   Respiratory Syncytial Virus NOT DETECTED NOT DETECTED Final   Bordetella pertussis NOT DETECTED NOT DETECTED Final   Chlamydophila pneumoniae NOT DETECTED NOT DETECTED Final   Mycoplasma pneumoniae NOT DETECTED NOT DETECTED Final     Labs: Basic Metabolic Panel:  Recent Labs Lab 01/01/16 2047 01/02/16 0219 01/02/16 1652 01/03/16 0247 01/04/16 0535 01/05/16 0805  NA  --  140 137 140 140 143  K  --  2.8* 4.7 3.6 3.2* 2.6*  CL  --  99* 98* 97* 92* 92*  CO2  --  33* 29 38* 41* 41*  GLUCOSE  --  166* 60* 81 96 90  BUN  --  11 9 7 14 15   CREATININE  --  0.63 0.56 0.62 0.76 0.65  CALCIUM  --  8.3* 8.8* 9.2 8.7* 9.1  MG 2.5* 2.1  --  1.4* 1.5* 1.2*  PHOS 3.0 2.6  --  2.0* 2.7 2.3*   Liver Function Tests:  Recent Labs Lab 12/29/15 1905 12/30/15 0450  01/01/16 0235 01/02/16 0219 01/03/16 0247 01/04/16 0535 01/05/16 0805  AST 20 16  --   --   --   --   --   --   ALT 14 12*  --   --   --   --   --   --   ALKPHOS 79 61  --   --   --   --   --   --   BILITOT 0.5 0.6  --   --   --   --   --   --   PROT 7.0 5.5*  --   --   --   --   --   --   ALBUMIN 2.4* 1.8*  < > 1.9* 2.1* 2.2* 2.2* 2.4*  < > = values in this interval not displayed. No results for input(s): LIPASE, AMYLASE in the last 168 hours. No results for input(s): AMMONIA in the last 168 hours. CBC:  Recent Labs Lab 01/01/16 0232 01/02/16 0219 01/03/16 0247 01/04/16 0535  01/05/16 0805  WBC 23.7* 17.6* 17.4* 14.8* 13.9*  NEUTROABS 22.7* 16.8* 15.2* 11.8* 10.2*  HGB 8.6* 9.8* 10.2* 9.5* 10.0*  HCT 28.8* 31.8* 33.7* 31.2* 33.3*  MCV 93.8 92.2 91.8 91.8 93.5  PLT 362 330 379 354 251   Cardiac Enzymes:  Recent Labs Lab 12/30/15 0450  TROPONINI <0.03   BNP: BNP (last 3 results)  Recent Labs  10/15/15 1629 12/29/15 1905  BNP 181.0* 222.0*    ProBNP (last 3 results) No results for input(s): PROBNP in the last 8760 hours.  CBG:  Recent Labs Lab 01/04/16 1213 01/04/16 1721 01/05/16 0253 01/05/16 0822 01/05/16 1214  GLUCAP 344* 171* 172* 112* 134*       Signed:  Dalyn Kjos MD.  [facility] 01/05/2016, 1:30 PM

## 2016-01-05 NOTE — Progress Notes (Signed)
CRITICAL VALUE ALERT  Critical value received:  K 2.6  Date of notification:  01/05/2016  Time of notification:  0915  Critical value read back:Yes.    Nurse who received alert:  Tawny Raspberry   MD notified (1st page):  Dr. Broadus John  Time of first page:  0915  MD notified (2nd page):  Time of second page:  Responding MD:    Time MD responded:  [phone]

## 2016-01-05 NOTE — Progress Notes (Addendum)
Palliative:  I met again with Ms. Gladwell. She is awake and has breakfast tray in front of her and has eaten well. She does not remember myself or our conversation. After some trying she does recall her phone number to reach her husband and daughter. Still somewhat confused. No family at bedside. Please call [phone] for further palliative needs.   No Charge  Vinie Sill, NP Palliative Medicine Team Pager # [phone] (M-F 8a-5p) Team Phone # [phone] (Nights/Weekends)

## 2016-01-05 NOTE — Care Management Important Message (Signed)
Important Message  Patient Details  Name: REILEE HIROSE MRN: [id number] Date of Birth: [birth year]   Medicare Important Message Given:  Yes    Carles Collet, RN 01/05/2016, 11:20 AM

## 2016-01-05 NOTE — Progress Notes (Signed)
Faxed DC summ to [facility] at [facility]

## 2016-01-09 DIAGNOSIS — S81811D Laceration without foreign body, right lower leg, subsequent encounter: Secondary | ICD-10-CM | POA: Diagnosis not present

## 2016-01-09 DIAGNOSIS — M069 Rheumatoid arthritis, unspecified: Secondary | ICD-10-CM | POA: Diagnosis not present

## 2016-01-09 DIAGNOSIS — I739 Peripheral vascular disease, unspecified: Secondary | ICD-10-CM | POA: Diagnosis not present

## 2016-01-09 DIAGNOSIS — Z7982 Long term (current) use of aspirin: Secondary | ICD-10-CM | POA: Diagnosis not present

## 2016-01-09 DIAGNOSIS — J9622 Acute and chronic respiratory failure with hypercapnia: Secondary | ICD-10-CM | POA: Diagnosis not present

## 2016-01-09 DIAGNOSIS — E43 Unspecified severe protein-calorie malnutrition: Secondary | ICD-10-CM | POA: Diagnosis not present

## 2016-01-09 DIAGNOSIS — R627 Adult failure to thrive: Secondary | ICD-10-CM | POA: Diagnosis not present

## 2016-01-09 DIAGNOSIS — I5032 Chronic diastolic (congestive) heart failure: Secondary | ICD-10-CM | POA: Diagnosis not present

## 2016-01-09 DIAGNOSIS — I110 Hypertensive heart disease with heart failure: Secondary | ICD-10-CM | POA: Diagnosis not present

## 2016-01-09 DIAGNOSIS — D539 Nutritional anemia, unspecified: Secondary | ICD-10-CM | POA: Diagnosis not present

## 2016-01-09 DIAGNOSIS — J441 Chronic obstructive pulmonary disease with (acute) exacerbation: Secondary | ICD-10-CM | POA: Diagnosis not present

## 2016-01-09 DIAGNOSIS — Z4800 Encounter for change or removal of nonsurgical wound dressing: Secondary | ICD-10-CM | POA: Diagnosis not present

## 2016-01-09 DIAGNOSIS — L89620 Pressure ulcer of left heel, unstageable: Secondary | ICD-10-CM | POA: Diagnosis not present

## 2016-01-11 DIAGNOSIS — I110 Hypertensive heart disease with heart failure: Secondary | ICD-10-CM | POA: Diagnosis not present

## 2016-01-11 DIAGNOSIS — Z4800 Encounter for change or removal of nonsurgical wound dressing: Secondary | ICD-10-CM | POA: Diagnosis not present

## 2016-01-11 DIAGNOSIS — E43 Unspecified severe protein-calorie malnutrition: Secondary | ICD-10-CM | POA: Diagnosis not present

## 2016-01-11 DIAGNOSIS — I739 Peripheral vascular disease, unspecified: Secondary | ICD-10-CM | POA: Diagnosis not present

## 2016-01-11 DIAGNOSIS — I5032 Chronic diastolic (congestive) heart failure: Secondary | ICD-10-CM | POA: Diagnosis not present

## 2016-01-11 DIAGNOSIS — R627 Adult failure to thrive: Secondary | ICD-10-CM | POA: Diagnosis not present

## 2016-01-11 DIAGNOSIS — M069 Rheumatoid arthritis, unspecified: Secondary | ICD-10-CM | POA: Diagnosis not present

## 2016-01-11 DIAGNOSIS — J441 Chronic obstructive pulmonary disease with (acute) exacerbation: Secondary | ICD-10-CM | POA: Diagnosis not present

## 2016-01-11 DIAGNOSIS — Z7982 Long term (current) use of aspirin: Secondary | ICD-10-CM | POA: Diagnosis not present

## 2016-01-11 DIAGNOSIS — S81811D Laceration without foreign body, right lower leg, subsequent encounter: Secondary | ICD-10-CM | POA: Diagnosis not present

## 2016-01-11 DIAGNOSIS — J9622 Acute and chronic respiratory failure with hypercapnia: Secondary | ICD-10-CM | POA: Diagnosis not present

## 2016-01-11 DIAGNOSIS — D539 Nutritional anemia, unspecified: Secondary | ICD-10-CM | POA: Diagnosis not present

## 2016-01-11 DIAGNOSIS — L89620 Pressure ulcer of left heel, unstageable: Secondary | ICD-10-CM | POA: Diagnosis not present

## 2016-01-13 DIAGNOSIS — J441 Chronic obstructive pulmonary disease with (acute) exacerbation: Secondary | ICD-10-CM | POA: Diagnosis not present

## 2016-01-13 DIAGNOSIS — Z4800 Encounter for change or removal of nonsurgical wound dressing: Secondary | ICD-10-CM | POA: Diagnosis not present

## 2016-01-13 DIAGNOSIS — J9622 Acute and chronic respiratory failure with hypercapnia: Secondary | ICD-10-CM | POA: Diagnosis not present

## 2016-01-13 DIAGNOSIS — I110 Hypertensive heart disease with heart failure: Secondary | ICD-10-CM | POA: Diagnosis not present

## 2016-01-13 DIAGNOSIS — R627 Adult failure to thrive: Secondary | ICD-10-CM | POA: Diagnosis not present

## 2016-01-13 DIAGNOSIS — L89620 Pressure ulcer of left heel, unstageable: Secondary | ICD-10-CM | POA: Diagnosis not present

## 2016-01-13 DIAGNOSIS — I739 Peripheral vascular disease, unspecified: Secondary | ICD-10-CM | POA: Diagnosis not present

## 2016-01-13 DIAGNOSIS — I5032 Chronic diastolic (congestive) heart failure: Secondary | ICD-10-CM | POA: Diagnosis not present

## 2016-01-16 DIAGNOSIS — Z4800 Encounter for change or removal of nonsurgical wound dressing: Secondary | ICD-10-CM | POA: Diagnosis not present

## 2016-01-16 DIAGNOSIS — J9622 Acute and chronic respiratory failure with hypercapnia: Secondary | ICD-10-CM | POA: Diagnosis not present

## 2016-01-16 DIAGNOSIS — J441 Chronic obstructive pulmonary disease with (acute) exacerbation: Secondary | ICD-10-CM | POA: Diagnosis not present

## 2016-01-16 DIAGNOSIS — L89620 Pressure ulcer of left heel, unstageable: Secondary | ICD-10-CM | POA: Diagnosis not present

## 2016-01-16 DIAGNOSIS — R627 Adult failure to thrive: Secondary | ICD-10-CM | POA: Diagnosis not present

## 2016-01-16 DIAGNOSIS — I739 Peripheral vascular disease, unspecified: Secondary | ICD-10-CM | POA: Diagnosis not present

## 2016-01-16 DIAGNOSIS — I5032 Chronic diastolic (congestive) heart failure: Secondary | ICD-10-CM | POA: Diagnosis not present

## 2016-01-16 DIAGNOSIS — I110 Hypertensive heart disease with heart failure: Secondary | ICD-10-CM | POA: Diagnosis not present

## 2016-01-17 DIAGNOSIS — L89620 Pressure ulcer of left heel, unstageable: Secondary | ICD-10-CM | POA: Diagnosis not present

## 2016-01-17 DIAGNOSIS — Z4800 Encounter for change or removal of nonsurgical wound dressing: Secondary | ICD-10-CM | POA: Diagnosis not present

## 2016-01-17 DIAGNOSIS — J9622 Acute and chronic respiratory failure with hypercapnia: Secondary | ICD-10-CM | POA: Diagnosis not present

## 2016-01-17 DIAGNOSIS — R627 Adult failure to thrive: Secondary | ICD-10-CM | POA: Diagnosis not present

## 2016-01-17 DIAGNOSIS — I5032 Chronic diastolic (congestive) heart failure: Secondary | ICD-10-CM | POA: Diagnosis not present

## 2016-01-17 DIAGNOSIS — I739 Peripheral vascular disease, unspecified: Secondary | ICD-10-CM | POA: Diagnosis not present

## 2016-01-17 DIAGNOSIS — J441 Chronic obstructive pulmonary disease with (acute) exacerbation: Secondary | ICD-10-CM | POA: Diagnosis not present

## 2016-01-17 DIAGNOSIS — I110 Hypertensive heart disease with heart failure: Secondary | ICD-10-CM | POA: Diagnosis not present

## 2016-01-18 ENCOUNTER — Encounter: Payer: Self-pay | Admitting: Nurse Practitioner

## 2016-01-18 ENCOUNTER — Ambulatory Visit (INDEPENDENT_AMBULATORY_CARE_PROVIDER_SITE_OTHER): Payer: Medicare Other | Admitting: Nurse Practitioner

## 2016-01-18 VITALS — BP 95/58 | HR 58 | Temp 96.8°F

## 2016-01-18 DIAGNOSIS — J9602 Acute respiratory failure with hypercapnia: Secondary | ICD-10-CM | POA: Diagnosis not present

## 2016-01-18 DIAGNOSIS — Z09 Encounter for follow-up examination after completed treatment for conditions other than malignant neoplasm: Secondary | ICD-10-CM | POA: Diagnosis not present

## 2016-01-18 DIAGNOSIS — J9601 Acute respiratory failure with hypoxia: Secondary | ICD-10-CM

## 2016-01-18 DIAGNOSIS — Z23 Encounter for immunization: Secondary | ICD-10-CM | POA: Diagnosis not present

## 2016-01-18 DIAGNOSIS — E43 Unspecified severe protein-calorie malnutrition: Secondary | ICD-10-CM | POA: Diagnosis not present

## 2016-01-18 NOTE — Patient Instructions (Signed)
Respiratory failure is when your lungs are not working well and your breathing (respiratory) system fails. When respiratory failure occurs, it is difficult for your lungs to get enough oxygen, get rid of carbon dioxide, or both. Respiratory failure can be life threatening.  °Respiratory failure can be acute or chronic. Acute respiratory failure is sudden, severe, and requires emergency medical treatment. Chronic respiratory failure is less severe, happens over time, and requires ongoing treatment.  °WHAT ARE THE CAUSES OF ACUTE RESPIRATORY FAILURE?  °Any problem affecting the heart or lungs can cause acute respiratory failure. Some of these causes include the following: °· Chronic bronchitis and emphysema (COPD).   °· Blood clot going to a lung (pulmonary embolism).   °· Having water in the lungs caused by heart failure, lung injury, or infection (pulmonary edema).   °· Collapsed lung (pneumothorax).   °· Pneumonia.   °· Pulmonary fibrosis.   °· Obesity.   °· Asthma.   °· Heart failure.   °· Any type of trauma to the chest that can make breathing difficult.   °· Nerve or muscle diseases making chest movements difficult. °HOW WILL MY ACUTE RESPIRATORY FAILURE BE TREATED?  °Treatment of acute respiratory failure depends on the cause of the respiratory failure. Usually, you will stay in the intensive care unit so your breathing can be watched closely. Treatment can include the following: °· Oxygen. Oxygen can be delivered through the following: °¨ Nasal cannula. This is small tubing that goes in your nose to give you oxygen. °¨ Face mask. A face mask covers your nose and mouth to give you oxygen. °· Medicine. Different medicines can be given to help with breathing. These can include: °¨ Nebulizers. Nebulizers deliver medicines to open the air passages (bronchodilators). These medicines help to open or relax the airways in the lungs so you can breathe better. They can also help loosen mucus from your  lungs. °¨ Diuretics. Diuretic medicines can help you breathe better by getting rid of extra water in your body. °¨ Steroids. Steroid medicines can help decrease swelling (inflammation) in your lungs. °¨ Antibiotics. °· Chest tube. If you have a collapsed lung (pneumothorax), a chest tube is placed to help reinflate the lung. °· Noninvasive positive pressure ventilation (NPPV). This is a tight-fitting mask that goes over your nose and mouth. The mask has tubing that is attached to a machine. The machine blows air into the tubing, which helps to keep the tiny air sacs (alveoli) in your lungs open. This machine allows you to breathe on your own. °· Ventilator. A ventilator is a breathing machine. When on a ventilator, a breathing tube is put into the lungs. A ventilator is used when you can no longer breathe well enough on your own. You may have low oxygen levels or high carbon dioxide (CO2) levels in your blood. When you are on a ventilator, sedation and pain medicines are given to make you sleep so your lungs can heal. °SEEK IMMEDIATE MEDICAL CARE IF: °· You have shortness of breath (dyspnea) with or without activity. °· You have rapid breathing (tachypnea). °· You are wheezing. °· You are unable to say more than a few words without having to catch your breath. °· You find it very difficult to function normally. °· You have a fast heart rate. °· You have a bluish color to your finger or toe nail beds. °· You have confusion or drowsiness or both. °  °This information is not intended to replace advice given to you by your health care provider. Make sure you discuss   any questions you have with your health care provider. °  °Document Released: 03/09/2013 Document Revised: 11/23/2014 Document Reviewed: 03/09/2013 °Elsevier Interactive Patient Education ©2016 [employer] ° °

## 2016-01-18 NOTE — Progress Notes (Addendum)
   Subjective:    Patient ID: [name], female    DOB: [birth year], 70 y.o.   MRN: [id number]  HPI Patient here today for hospital follow up. SHe was discharged 01/06/16 with respiratory failure and malnutrition. She says she is doing much better. SHe is still O2 at home at 2l. She stays SOB all the time. Is not abe to take care of herself and her husband is her caregiver.   * Patient has been on continuous O2 for over a year and cannot function without.  Review of Systems  Constitutional: Negative.   HENT: Negative.   Respiratory: Negative.   Cardiovascular: Negative.   Gastrointestinal: Negative.   Genitourinary: Negative.   Neurological: Negative.   Psychiatric/Behavioral: Negative.   All other systems reviewed and are negative.      Objective:   Physical Exam  Constitutional: She is oriented to person, place, and time. She appears well-developed and well-nourished. No distress.  Cardiovascular: Normal rate, regular rhythm and normal heart sounds.   Pulmonary/Chest: Effort normal. No respiratory distress. She has wheezes (expiratory wheezes throughout).  o2 via nasal cannulla at 2l Diminished breath sounds throughout lung fields. Rhonchi throughout  Neurological: She is alert and oriented to person, place, and time.  Skin: Skin is warm.  Psychiatric: She has a normal mood and affect. Her behavior is normal. Judgment and thought content normal.   BP (!) 95/58   Pulse (!) 58   Temp (!) 96.8 F (36 C) (Oral)   SpO2 95%        Assessment & Plan:  1. Hospital discharge follow-up Hospital records reviewed  2. Protein-calorie malnutrition, severe Boost daily - CMP14+EGFR  3. Acute respiratory failure with hypoxia and hypercapnia (HCC) Continue O2 via nasal cannulla Follow up as needed  Mary-Margaret Hassell Done, FNP

## 2016-01-19 DIAGNOSIS — I739 Peripheral vascular disease, unspecified: Secondary | ICD-10-CM | POA: Diagnosis not present

## 2016-01-19 DIAGNOSIS — J9622 Acute and chronic respiratory failure with hypercapnia: Secondary | ICD-10-CM | POA: Diagnosis not present

## 2016-01-19 DIAGNOSIS — J441 Chronic obstructive pulmonary disease with (acute) exacerbation: Secondary | ICD-10-CM | POA: Diagnosis not present

## 2016-01-19 DIAGNOSIS — R627 Adult failure to thrive: Secondary | ICD-10-CM | POA: Diagnosis not present

## 2016-01-19 DIAGNOSIS — I110 Hypertensive heart disease with heart failure: Secondary | ICD-10-CM | POA: Diagnosis not present

## 2016-01-19 DIAGNOSIS — L89620 Pressure ulcer of left heel, unstageable: Secondary | ICD-10-CM | POA: Diagnosis not present

## 2016-01-19 DIAGNOSIS — I5032 Chronic diastolic (congestive) heart failure: Secondary | ICD-10-CM | POA: Diagnosis not present

## 2016-01-19 DIAGNOSIS — Z4800 Encounter for change or removal of nonsurgical wound dressing: Secondary | ICD-10-CM | POA: Diagnosis not present

## 2016-01-19 LAB — CMP14+EGFR
A/G RATIO: 1 — AB (ref 1.2–2.2)
ALK PHOS: 104 IU/L (ref 39–117)
ALT: 26 IU/L (ref 0–32)
AST: 22 IU/L (ref 0–40)
Albumin: 3.1 g/dL — ABNORMAL LOW (ref 3.5–4.8)
BILIRUBIN TOTAL: 0.3 mg/dL (ref 0.0–1.2)
BUN / CREAT RATIO: 12 (ref 12–28)
BUN: 11 mg/dL (ref 8–27)
CHLORIDE: 94 mmol/L — AB (ref 96–106)
CO2: 31 mmol/L — ABNORMAL HIGH (ref 18–29)
Calcium: 9.7 mg/dL (ref 8.7–10.3)
Creatinine, Ser: 0.94 mg/dL (ref 0.57–1.00)
GFR calc Af Amer: 71 mL/min/{1.73_m2} (ref >59)
GFR calc non Af Amer: 61 mL/min/{1.73_m2} (ref >59)
GLUCOSE: 100 mg/dL — AB (ref 65–99)
Globulin, Total: 3 g/dL (ref 1.5–4.5)
POTASSIUM: 5.5 mmol/L — AB (ref 3.5–5.2)
Sodium: 140 mmol/L (ref 134–144)
Total Protein: 6.1 g/dL (ref 6.0–8.5)

## 2016-01-22 ENCOUNTER — Telehealth: Payer: Self-pay | Admitting: Nurse Practitioner

## 2016-01-22 NOTE — Telephone Encounter (Signed)
Note faxed.

## 2016-01-23 DIAGNOSIS — Z4800 Encounter for change or removal of nonsurgical wound dressing: Secondary | ICD-10-CM | POA: Diagnosis not present

## 2016-01-23 DIAGNOSIS — I110 Hypertensive heart disease with heart failure: Secondary | ICD-10-CM | POA: Diagnosis not present

## 2016-01-23 DIAGNOSIS — I5032 Chronic diastolic (congestive) heart failure: Secondary | ICD-10-CM | POA: Diagnosis not present

## 2016-01-23 DIAGNOSIS — J441 Chronic obstructive pulmonary disease with (acute) exacerbation: Secondary | ICD-10-CM | POA: Diagnosis not present

## 2016-01-23 DIAGNOSIS — J9622 Acute and chronic respiratory failure with hypercapnia: Secondary | ICD-10-CM | POA: Diagnosis not present

## 2016-01-23 DIAGNOSIS — I739 Peripheral vascular disease, unspecified: Secondary | ICD-10-CM | POA: Diagnosis not present

## 2016-01-23 DIAGNOSIS — L89620 Pressure ulcer of left heel, unstageable: Secondary | ICD-10-CM | POA: Diagnosis not present

## 2016-01-23 DIAGNOSIS — R627 Adult failure to thrive: Secondary | ICD-10-CM | POA: Diagnosis not present

## 2016-01-24 ENCOUNTER — Other Ambulatory Visit: Payer: Self-pay | Admitting: Nurse Practitioner

## 2016-01-24 ENCOUNTER — Other Ambulatory Visit: Payer: Medicare Other

## 2016-01-24 DIAGNOSIS — E875 Hyperkalemia: Secondary | ICD-10-CM

## 2016-01-24 DIAGNOSIS — I739 Peripheral vascular disease, unspecified: Secondary | ICD-10-CM | POA: Diagnosis not present

## 2016-01-24 DIAGNOSIS — R627 Adult failure to thrive: Secondary | ICD-10-CM | POA: Diagnosis not present

## 2016-01-24 DIAGNOSIS — Z4800 Encounter for change or removal of nonsurgical wound dressing: Secondary | ICD-10-CM | POA: Diagnosis not present

## 2016-01-24 DIAGNOSIS — I5032 Chronic diastolic (congestive) heart failure: Secondary | ICD-10-CM | POA: Diagnosis not present

## 2016-01-24 DIAGNOSIS — J9622 Acute and chronic respiratory failure with hypercapnia: Secondary | ICD-10-CM | POA: Diagnosis not present

## 2016-01-24 DIAGNOSIS — J441 Chronic obstructive pulmonary disease with (acute) exacerbation: Secondary | ICD-10-CM | POA: Diagnosis not present

## 2016-01-24 DIAGNOSIS — L89620 Pressure ulcer of left heel, unstageable: Secondary | ICD-10-CM | POA: Diagnosis not present

## 2016-01-24 DIAGNOSIS — I110 Hypertensive heart disease with heart failure: Secondary | ICD-10-CM | POA: Diagnosis not present

## 2016-01-25 DIAGNOSIS — L89620 Pressure ulcer of left heel, unstageable: Secondary | ICD-10-CM | POA: Diagnosis not present

## 2016-01-25 DIAGNOSIS — Z4800 Encounter for change or removal of nonsurgical wound dressing: Secondary | ICD-10-CM | POA: Diagnosis not present

## 2016-01-25 DIAGNOSIS — R627 Adult failure to thrive: Secondary | ICD-10-CM | POA: Diagnosis not present

## 2016-01-25 DIAGNOSIS — J441 Chronic obstructive pulmonary disease with (acute) exacerbation: Secondary | ICD-10-CM | POA: Diagnosis not present

## 2016-01-25 DIAGNOSIS — E785 Hyperlipidemia, unspecified: Secondary | ICD-10-CM | POA: Diagnosis not present

## 2016-01-25 DIAGNOSIS — I5032 Chronic diastolic (congestive) heart failure: Secondary | ICD-10-CM | POA: Diagnosis not present

## 2016-01-25 DIAGNOSIS — I110 Hypertensive heart disease with heart failure: Secondary | ICD-10-CM | POA: Diagnosis not present

## 2016-01-25 DIAGNOSIS — I739 Peripheral vascular disease, unspecified: Secondary | ICD-10-CM | POA: Diagnosis not present

## 2016-01-25 DIAGNOSIS — J9622 Acute and chronic respiratory failure with hypercapnia: Secondary | ICD-10-CM | POA: Diagnosis not present

## 2016-01-25 DIAGNOSIS — J449 Chronic obstructive pulmonary disease, unspecified: Secondary | ICD-10-CM | POA: Diagnosis not present

## 2016-01-25 LAB — BMP8+EGFR
BUN/Creatinine Ratio: 12 (ref 12–28)
BUN: 13 mg/dL (ref 8–27)
CALCIUM: 8.5 mg/dL — AB (ref 8.7–10.3)
CO2: 37 mmol/L — AB (ref 18–29)
Chloride: 87 mmol/L — ABNORMAL LOW (ref 96–106)
Creatinine, Ser: 1.09 mg/dL — ABNORMAL HIGH (ref 0.57–1.00)
GFR calc Af Amer: 59 mL/min/{1.73_m2} — ABNORMAL LOW (ref >59)
GFR calc non Af Amer: 51 mL/min/{1.73_m2} — ABNORMAL LOW (ref >59)
GLUCOSE: 114 mg/dL — AB (ref 65–99)
POTASSIUM: 4.3 mmol/L (ref 3.5–5.2)
SODIUM: 140 mmol/L (ref 134–144)

## 2016-01-30 DIAGNOSIS — J9622 Acute and chronic respiratory failure with hypercapnia: Secondary | ICD-10-CM | POA: Diagnosis not present

## 2016-01-30 DIAGNOSIS — R627 Adult failure to thrive: Secondary | ICD-10-CM | POA: Diagnosis not present

## 2016-01-30 DIAGNOSIS — L89620 Pressure ulcer of left heel, unstageable: Secondary | ICD-10-CM | POA: Diagnosis not present

## 2016-01-30 DIAGNOSIS — Z4800 Encounter for change or removal of nonsurgical wound dressing: Secondary | ICD-10-CM | POA: Diagnosis not present

## 2016-01-30 DIAGNOSIS — I110 Hypertensive heart disease with heart failure: Secondary | ICD-10-CM | POA: Diagnosis not present

## 2016-01-30 DIAGNOSIS — J441 Chronic obstructive pulmonary disease with (acute) exacerbation: Secondary | ICD-10-CM | POA: Diagnosis not present

## 2016-01-30 DIAGNOSIS — I739 Peripheral vascular disease, unspecified: Secondary | ICD-10-CM | POA: Diagnosis not present

## 2016-01-30 DIAGNOSIS — I5032 Chronic diastolic (congestive) heart failure: Secondary | ICD-10-CM | POA: Diagnosis not present

## 2016-02-01 ENCOUNTER — Telehealth: Payer: Self-pay | Admitting: Nurse Practitioner

## 2016-02-01 ENCOUNTER — Other Ambulatory Visit: Payer: Self-pay | Admitting: Nurse Practitioner

## 2016-02-01 DIAGNOSIS — I110 Hypertensive heart disease with heart failure: Secondary | ICD-10-CM | POA: Diagnosis not present

## 2016-02-01 DIAGNOSIS — J441 Chronic obstructive pulmonary disease with (acute) exacerbation: Secondary | ICD-10-CM | POA: Diagnosis not present

## 2016-02-01 DIAGNOSIS — I5032 Chronic diastolic (congestive) heart failure: Secondary | ICD-10-CM | POA: Diagnosis not present

## 2016-02-01 DIAGNOSIS — R627 Adult failure to thrive: Secondary | ICD-10-CM | POA: Diagnosis not present

## 2016-02-01 DIAGNOSIS — L89620 Pressure ulcer of left heel, unstageable: Secondary | ICD-10-CM | POA: Diagnosis not present

## 2016-02-01 DIAGNOSIS — J9622 Acute and chronic respiratory failure with hypercapnia: Secondary | ICD-10-CM | POA: Diagnosis not present

## 2016-02-01 DIAGNOSIS — I739 Peripheral vascular disease, unspecified: Secondary | ICD-10-CM | POA: Diagnosis not present

## 2016-02-01 DIAGNOSIS — Z4800 Encounter for change or removal of nonsurgical wound dressing: Secondary | ICD-10-CM | POA: Diagnosis not present

## 2016-02-01 NOTE — Telephone Encounter (Signed)
Pt has cough and congestion, no fever Using mucinex   Wants antibiotic

## 2016-02-02 ENCOUNTER — Other Ambulatory Visit: Payer: Medicare Other

## 2016-02-02 DIAGNOSIS — E875 Hyperkalemia: Secondary | ICD-10-CM

## 2016-02-02 NOTE — Telephone Encounter (Signed)
Patient has no fever at this time.  Patient aware.

## 2016-02-02 NOTE — Telephone Encounter (Signed)
If no fever does not need antibiotic

## 2016-02-03 LAB — BMP8+EGFR
BUN / CREAT RATIO: 13 (ref 12–28)
BUN: 12 mg/dL (ref 8–27)
CO2: 40 mmol/L — ABNORMAL HIGH (ref 18–29)
Calcium: 8.8 mg/dL (ref 8.7–10.3)
Chloride: 85 mmol/L — ABNORMAL LOW (ref 96–106)
Creatinine, Ser: 0.95 mg/dL (ref 0.57–1.00)
GFR, EST AFRICAN AMERICAN: 70 mL/min/{1.73_m2} (ref >59)
GFR, EST NON AFRICAN AMERICAN: 60 mL/min/{1.73_m2} (ref >59)
Glucose: 176 mg/dL — ABNORMAL HIGH (ref 65–99)
POTASSIUM: 3.3 mmol/L — AB (ref 3.5–5.2)
SODIUM: 138 mmol/L (ref 134–144)

## 2016-02-06 DIAGNOSIS — Z4800 Encounter for change or removal of nonsurgical wound dressing: Secondary | ICD-10-CM | POA: Diagnosis not present

## 2016-02-06 DIAGNOSIS — I5032 Chronic diastolic (congestive) heart failure: Secondary | ICD-10-CM | POA: Diagnosis not present

## 2016-02-06 DIAGNOSIS — J441 Chronic obstructive pulmonary disease with (acute) exacerbation: Secondary | ICD-10-CM | POA: Diagnosis not present

## 2016-02-06 DIAGNOSIS — I110 Hypertensive heart disease with heart failure: Secondary | ICD-10-CM | POA: Diagnosis not present

## 2016-02-06 DIAGNOSIS — J9622 Acute and chronic respiratory failure with hypercapnia: Secondary | ICD-10-CM | POA: Diagnosis not present

## 2016-02-06 DIAGNOSIS — R627 Adult failure to thrive: Secondary | ICD-10-CM | POA: Diagnosis not present

## 2016-02-06 DIAGNOSIS — I739 Peripheral vascular disease, unspecified: Secondary | ICD-10-CM | POA: Diagnosis not present

## 2016-02-06 DIAGNOSIS — L89620 Pressure ulcer of left heel, unstageable: Secondary | ICD-10-CM | POA: Diagnosis not present

## 2016-02-07 DIAGNOSIS — I5032 Chronic diastolic (congestive) heart failure: Secondary | ICD-10-CM | POA: Diagnosis not present

## 2016-02-07 DIAGNOSIS — I110 Hypertensive heart disease with heart failure: Secondary | ICD-10-CM | POA: Diagnosis not present

## 2016-02-07 DIAGNOSIS — L89620 Pressure ulcer of left heel, unstageable: Secondary | ICD-10-CM | POA: Diagnosis not present

## 2016-02-07 DIAGNOSIS — J9622 Acute and chronic respiratory failure with hypercapnia: Secondary | ICD-10-CM | POA: Diagnosis not present

## 2016-02-07 DIAGNOSIS — Z4800 Encounter for change or removal of nonsurgical wound dressing: Secondary | ICD-10-CM | POA: Diagnosis not present

## 2016-02-07 DIAGNOSIS — J441 Chronic obstructive pulmonary disease with (acute) exacerbation: Secondary | ICD-10-CM | POA: Diagnosis not present

## 2016-02-07 DIAGNOSIS — I739 Peripheral vascular disease, unspecified: Secondary | ICD-10-CM | POA: Diagnosis not present

## 2016-02-07 DIAGNOSIS — R627 Adult failure to thrive: Secondary | ICD-10-CM | POA: Diagnosis not present

## 2016-02-09 ENCOUNTER — Other Ambulatory Visit: Payer: Self-pay | Admitting: Nurse Practitioner

## 2016-02-12 DIAGNOSIS — L97929 Non-pressure chronic ulcer of unspecified part of left lower leg with unspecified severity: Secondary | ICD-10-CM | POA: Diagnosis not present

## 2016-02-13 ENCOUNTER — Encounter: Payer: Self-pay | Admitting: Nurse Practitioner

## 2016-02-13 ENCOUNTER — Ambulatory Visit (INDEPENDENT_AMBULATORY_CARE_PROVIDER_SITE_OTHER): Payer: Medicare Other | Admitting: Nurse Practitioner

## 2016-02-13 VITALS — BP 118/59 | HR 82 | Temp 97.8°F | Ht <= 58 in | Wt 85.0 lb

## 2016-02-13 DIAGNOSIS — L03116 Cellulitis of left lower limb: Secondary | ICD-10-CM | POA: Diagnosis not present

## 2016-02-13 NOTE — Progress Notes (Signed)
   Subjective:    Patient ID: [name], female    DOB: 09/10/1945, 70 y.o.   MRN: [id number]  HPI Patient in today for hospital follow up. She went to urgent care last night with a sore on her left foot. They gave her doxycycline to take at home and gave her an antibiotic shot before leaving urgent care. She is here today for follow up.  * O2 saturation 84% on room air.    Review of Systems  Constitutional: Negative.   HENT: Negative.   Respiratory: Negative.   Cardiovascular: Negative.   Genitourinary: Negative.   Neurological: Negative.   Psychiatric/Behavioral: Negative.   All other systems reviewed and are negative.      Objective:   Physical Exam  Constitutional: She is oriented to person, place, and time. She appears well-developed and well-nourished. No distress.  Cardiovascular: Normal rate and normal heart sounds.   Pulmonary/Chest: Effort normal and breath sounds normal.  Neurological: She is alert and oriented to person, place, and time.  Skin: Skin is warm.  Open erythematous wound about 6cm annular  On left  lateral lower leg and smaller wound on bottom of left heel  Psychiatric: She has a normal mood and affect. Her behavior is normal. Judgment and thought content normal.   BP (!) 118/59   Pulse 82   Temp 97.8 F (36.6 C) (Oral)   Ht 4\' 10"  (1.473 m)   Wt 85 lb (38.6 kg)   SpO2 (!) 84% Comment: room air  BMI 17.77 kg/m         Assessment & Plan:  1. Cellulitis of left lower leg Keep covered until seen by wound care Take doxycycline as rx Follow up prn - Ambulatory referral to [place], [place]

## 2016-02-13 NOTE — Progress Notes (Signed)
Subjective:     Patient ID: [name], female   DOB: May 19, 1945, 70 y.o.   MRN: [id number]  Referral sent to [facility] for wound care.l      Review of Systems     Objective:   Physical Exam     Assessment:       Plan:     ***

## 2016-02-13 NOTE — Patient Instructions (Signed)

## 2016-02-15 ENCOUNTER — Telehealth: Payer: Self-pay | Admitting: Nurse Practitioner

## 2016-02-15 NOTE — Telephone Encounter (Signed)
Husband aware of last lab and will hopefully hear from wound center by tomorrow morning.

## 2016-02-16 DIAGNOSIS — I110 Hypertensive heart disease with heart failure: Secondary | ICD-10-CM | POA: Diagnosis not present

## 2016-02-16 DIAGNOSIS — I739 Peripheral vascular disease, unspecified: Secondary | ICD-10-CM | POA: Diagnosis not present

## 2016-02-16 DIAGNOSIS — R627 Adult failure to thrive: Secondary | ICD-10-CM | POA: Diagnosis not present

## 2016-02-16 DIAGNOSIS — I5032 Chronic diastolic (congestive) heart failure: Secondary | ICD-10-CM | POA: Diagnosis not present

## 2016-02-16 DIAGNOSIS — J9622 Acute and chronic respiratory failure with hypercapnia: Secondary | ICD-10-CM | POA: Diagnosis not present

## 2016-02-16 DIAGNOSIS — J441 Chronic obstructive pulmonary disease with (acute) exacerbation: Secondary | ICD-10-CM | POA: Diagnosis not present

## 2016-02-16 DIAGNOSIS — Z4800 Encounter for change or removal of nonsurgical wound dressing: Secondary | ICD-10-CM | POA: Diagnosis not present

## 2016-02-16 DIAGNOSIS — L89620 Pressure ulcer of left heel, unstageable: Secondary | ICD-10-CM | POA: Diagnosis not present

## 2016-02-19 ENCOUNTER — Telehealth: Payer: Self-pay | Admitting: Nurse Practitioner

## 2016-02-19 DIAGNOSIS — I110 Hypertensive heart disease with heart failure: Secondary | ICD-10-CM | POA: Diagnosis not present

## 2016-02-19 DIAGNOSIS — L89620 Pressure ulcer of left heel, unstageable: Secondary | ICD-10-CM | POA: Diagnosis not present

## 2016-02-19 DIAGNOSIS — I739 Peripheral vascular disease, unspecified: Secondary | ICD-10-CM | POA: Diagnosis not present

## 2016-02-19 DIAGNOSIS — J9622 Acute and chronic respiratory failure with hypercapnia: Secondary | ICD-10-CM | POA: Diagnosis not present

## 2016-02-19 DIAGNOSIS — J441 Chronic obstructive pulmonary disease with (acute) exacerbation: Secondary | ICD-10-CM | POA: Diagnosis not present

## 2016-02-19 DIAGNOSIS — I5032 Chronic diastolic (congestive) heart failure: Secondary | ICD-10-CM | POA: Diagnosis not present

## 2016-02-19 DIAGNOSIS — R627 Adult failure to thrive: Secondary | ICD-10-CM | POA: Diagnosis not present

## 2016-02-19 DIAGNOSIS — Z4800 Encounter for change or removal of nonsurgical wound dressing: Secondary | ICD-10-CM | POA: Diagnosis not present

## 2016-02-19 NOTE — Telephone Encounter (Signed)
Pt's husband notified referral sent in Will contact [place]

## 2016-02-21 DIAGNOSIS — I739 Peripheral vascular disease, unspecified: Secondary | ICD-10-CM | POA: Diagnosis not present

## 2016-02-21 DIAGNOSIS — L89620 Pressure ulcer of left heel, unstageable: Secondary | ICD-10-CM | POA: Diagnosis not present

## 2016-02-21 DIAGNOSIS — I5032 Chronic diastolic (congestive) heart failure: Secondary | ICD-10-CM | POA: Diagnosis not present

## 2016-02-21 DIAGNOSIS — R627 Adult failure to thrive: Secondary | ICD-10-CM | POA: Diagnosis not present

## 2016-02-21 DIAGNOSIS — I110 Hypertensive heart disease with heart failure: Secondary | ICD-10-CM | POA: Diagnosis not present

## 2016-02-21 DIAGNOSIS — J9622 Acute and chronic respiratory failure with hypercapnia: Secondary | ICD-10-CM | POA: Diagnosis not present

## 2016-02-21 DIAGNOSIS — J441 Chronic obstructive pulmonary disease with (acute) exacerbation: Secondary | ICD-10-CM | POA: Diagnosis not present

## 2016-02-21 DIAGNOSIS — Z4800 Encounter for change or removal of nonsurgical wound dressing: Secondary | ICD-10-CM | POA: Diagnosis not present

## 2016-02-24 DIAGNOSIS — J449 Chronic obstructive pulmonary disease, unspecified: Secondary | ICD-10-CM | POA: Diagnosis not present

## 2016-02-24 DIAGNOSIS — E785 Hyperlipidemia, unspecified: Secondary | ICD-10-CM | POA: Diagnosis not present

## 2016-02-26 DIAGNOSIS — L89620 Pressure ulcer of left heel, unstageable: Secondary | ICD-10-CM | POA: Diagnosis not present

## 2016-02-26 DIAGNOSIS — I739 Peripheral vascular disease, unspecified: Secondary | ICD-10-CM | POA: Diagnosis not present

## 2016-02-26 DIAGNOSIS — I5032 Chronic diastolic (congestive) heart failure: Secondary | ICD-10-CM | POA: Diagnosis not present

## 2016-02-26 DIAGNOSIS — R627 Adult failure to thrive: Secondary | ICD-10-CM | POA: Diagnosis not present

## 2016-02-26 DIAGNOSIS — I110 Hypertensive heart disease with heart failure: Secondary | ICD-10-CM | POA: Diagnosis not present

## 2016-02-26 DIAGNOSIS — Z4800 Encounter for change or removal of nonsurgical wound dressing: Secondary | ICD-10-CM | POA: Diagnosis not present

## 2016-02-26 DIAGNOSIS — J441 Chronic obstructive pulmonary disease with (acute) exacerbation: Secondary | ICD-10-CM | POA: Diagnosis not present

## 2016-02-26 DIAGNOSIS — J9622 Acute and chronic respiratory failure with hypercapnia: Secondary | ICD-10-CM | POA: Diagnosis not present

## 2016-02-27 ENCOUNTER — Other Ambulatory Visit: Payer: Self-pay | Admitting: Nurse Practitioner

## 2016-02-28 DIAGNOSIS — I110 Hypertensive heart disease with heart failure: Secondary | ICD-10-CM | POA: Diagnosis not present

## 2016-02-28 DIAGNOSIS — J441 Chronic obstructive pulmonary disease with (acute) exacerbation: Secondary | ICD-10-CM | POA: Diagnosis not present

## 2016-02-28 DIAGNOSIS — L89620 Pressure ulcer of left heel, unstageable: Secondary | ICD-10-CM | POA: Diagnosis not present

## 2016-02-28 DIAGNOSIS — I739 Peripheral vascular disease, unspecified: Secondary | ICD-10-CM | POA: Diagnosis not present

## 2016-02-28 DIAGNOSIS — Z4800 Encounter for change or removal of nonsurgical wound dressing: Secondary | ICD-10-CM | POA: Diagnosis not present

## 2016-02-28 DIAGNOSIS — J9622 Acute and chronic respiratory failure with hypercapnia: Secondary | ICD-10-CM | POA: Diagnosis not present

## 2016-02-28 DIAGNOSIS — I5032 Chronic diastolic (congestive) heart failure: Secondary | ICD-10-CM | POA: Diagnosis not present

## 2016-02-28 DIAGNOSIS — R627 Adult failure to thrive: Secondary | ICD-10-CM | POA: Diagnosis not present

## 2016-03-01 ENCOUNTER — Ambulatory Visit (INDEPENDENT_AMBULATORY_CARE_PROVIDER_SITE_OTHER): Payer: Medicare Other | Admitting: Nurse Practitioner

## 2016-03-01 ENCOUNTER — Ambulatory Visit: Payer: Medicare Other | Admitting: Nurse Practitioner

## 2016-03-01 ENCOUNTER — Encounter: Payer: Self-pay | Admitting: Nurse Practitioner

## 2016-03-01 VITALS — BP 80/49 | HR 74 | Temp 97.1°F

## 2016-03-01 DIAGNOSIS — L89899 Pressure ulcer of other site, unspecified stage: Secondary | ICD-10-CM

## 2016-03-01 MED ORDER — AMOXICILLIN 875 MG PO TABS: 875.0000 mg | ORAL_TABLET | Freq: Two times a day (BID) | ORAL | 0 refills | Status: DC

## 2016-03-01 MED ORDER — HYDROCODONE-HOMATROPINE 5-1.5 MG/5ML PO SYRP: 5.0000 mL | ORAL_SOLUTION | Freq: Four times a day (QID) | ORAL | 0 refills | Status: DC | PRN

## 2016-03-01 NOTE — Addendum Note (Signed)
Addended by: Chevis Pretty on: 03/01/2016 04:50 PM   Modules accepted: Orders

## 2016-03-01 NOTE — Addendum Note (Signed)
Addended by: Chevis Pretty on: 03/01/2016 04:54 PM   Modules accepted: Orders

## 2016-03-01 NOTE — Progress Notes (Addendum)
   Subjective:    Patient ID: [name], female    DOB: Jul 06, 1945, 70 y.o.   MRN: [id number]  HPI Patient is brought in by her husband for recheck of wound on left lower leg. She has been getting wound care at home but wound has not completely heeled. Home health discharged them saying that they showed husband how to care for wound and showed understanding of proper care. They are also considered to be no longer home bound and did not need wound care at home. They are here today to have wound checked.    Review of Systems  Respiratory: Positive for shortness of breath (no more then usual.).   Musculoskeletal: Negative.   Skin: Positive for wound (left lower leg).  Neurological: Negative.   Psychiatric/Behavioral: Negative.   All other systems reviewed and are negative.      Objective:   Physical Exam  Constitutional: She appears well-developed and well-nourished. No distress.  Cardiovascular: Normal rate, regular rhythm and normal heart sounds.   Pulmonary/Chest: Effort normal and breath sounds normal.  Abdominal: Soft. Bowel sounds are normal.  Skin: Skin is warm.  5cm egg shaped moist lesion with yellow excudate left lower leg and heel. Left foot cool to touch with slight cyanosis- weak pedal pulse.  Psychiatric: She has a normal mood and affect. Her behavior is normal. Judgment and thought content normal.   BP (!) 80/49   Pulse 74   Temp 97.1 F (36.2 C) (Oral)   duoderm to left heel wound mepilex dressing to left leg wound.      Assessment & Plan:  1. Decubitus ulcer of left leg, unspecified pressure ulcer stage Continue home care as taught by home health until can see wound center Keep pressure off area - AMB referral to wound care center  [place], [place]

## 2016-03-02 LAB — BMP8+EGFR
BUN/Creatinine Ratio: 17 (ref 12–28)
BUN: 16 mg/dL (ref 8–27)
CALCIUM: 8.3 mg/dL — AB (ref 8.7–10.3)
CO2: 35 mmol/L — AB (ref 18–29)
CREATININE: 0.95 mg/dL (ref 0.57–1.00)
Chloride: 86 mmol/L — ABNORMAL LOW (ref 96–106)
GFR calc Af Amer: 70 mL/min/{1.73_m2} (ref >59)
GFR calc non Af Amer: 60 mL/min/{1.73_m2} (ref >59)
GLUCOSE: 106 mg/dL — AB (ref 65–99)
POTASSIUM: 3.5 mmol/L (ref 3.5–5.2)
SODIUM: 134 mmol/L (ref 134–144)

## 2016-03-05 DIAGNOSIS — Z4800 Encounter for change or removal of nonsurgical wound dressing: Secondary | ICD-10-CM | POA: Diagnosis not present

## 2016-03-05 DIAGNOSIS — J441 Chronic obstructive pulmonary disease with (acute) exacerbation: Secondary | ICD-10-CM | POA: Diagnosis not present

## 2016-03-05 DIAGNOSIS — R627 Adult failure to thrive: Secondary | ICD-10-CM | POA: Diagnosis not present

## 2016-03-05 DIAGNOSIS — I739 Peripheral vascular disease, unspecified: Secondary | ICD-10-CM | POA: Diagnosis not present

## 2016-03-05 DIAGNOSIS — J9622 Acute and chronic respiratory failure with hypercapnia: Secondary | ICD-10-CM | POA: Diagnosis not present

## 2016-03-05 DIAGNOSIS — I110 Hypertensive heart disease with heart failure: Secondary | ICD-10-CM | POA: Diagnosis not present

## 2016-03-05 DIAGNOSIS — L89620 Pressure ulcer of left heel, unstageable: Secondary | ICD-10-CM | POA: Diagnosis not present

## 2016-03-05 DIAGNOSIS — I5032 Chronic diastolic (congestive) heart failure: Secondary | ICD-10-CM | POA: Diagnosis not present

## 2016-03-07 DIAGNOSIS — R627 Adult failure to thrive: Secondary | ICD-10-CM | POA: Diagnosis not present

## 2016-03-07 DIAGNOSIS — I739 Peripheral vascular disease, unspecified: Secondary | ICD-10-CM | POA: Diagnosis not present

## 2016-03-07 DIAGNOSIS — L89620 Pressure ulcer of left heel, unstageable: Secondary | ICD-10-CM | POA: Diagnosis not present

## 2016-03-07 DIAGNOSIS — J9622 Acute and chronic respiratory failure with hypercapnia: Secondary | ICD-10-CM | POA: Diagnosis not present

## 2016-03-07 DIAGNOSIS — I110 Hypertensive heart disease with heart failure: Secondary | ICD-10-CM | POA: Diagnosis not present

## 2016-03-07 DIAGNOSIS — I5032 Chronic diastolic (congestive) heart failure: Secondary | ICD-10-CM | POA: Diagnosis not present

## 2016-03-07 DIAGNOSIS — J441 Chronic obstructive pulmonary disease with (acute) exacerbation: Secondary | ICD-10-CM | POA: Diagnosis not present

## 2016-03-07 DIAGNOSIS — Z4800 Encounter for change or removal of nonsurgical wound dressing: Secondary | ICD-10-CM | POA: Diagnosis not present

## 2016-03-08 ENCOUNTER — Ambulatory Visit (HOSPITAL_COMMUNITY): Payer: Medicare Other | Attending: Nurse Practitioner | Admitting: Physical Therapy

## 2016-03-08 DIAGNOSIS — M79662 Pain in left lower leg: Secondary | ICD-10-CM | POA: Insufficient documentation

## 2016-03-08 DIAGNOSIS — X58XXXS Exposure to other specified factors, sequela: Secondary | ICD-10-CM | POA: Diagnosis not present

## 2016-03-08 DIAGNOSIS — R262 Difficulty in walking, not elsewhere classified: Secondary | ICD-10-CM | POA: Insufficient documentation

## 2016-03-08 DIAGNOSIS — S81802S Unspecified open wound, left lower leg, sequela: Secondary | ICD-10-CM | POA: Insufficient documentation

## 2016-03-08 NOTE — Therapy (Signed)
[place], [place], [address] Phone: [phone]   Fax:  [phone]  Wound Care Evaluation  Patient Details  Name: [name] MRN: [id number] Date of Birth: Jan 15, 1946 Referring Provider: Chevis Pretty   Encounter Date: 03/08/2016      PT End of Session - 03/08/16 1717    Visit Number 1   Number of Visits 12   Date for PT Re-Evaluation 03/29/16   Authorization Type [employer] Medicare    Authorization Time Period 03/08/16 to 04/19/16   Authorization - Visit Number 1   Authorization - Number of Visits 10   PT Start Time [id number]   PT Stop Time 1440   PT Time Calculation (min) 48 min   Activity Tolerance Patient tolerated treatment well   Behavior During Therapy [facility] for tasks assessed/performed      Past Medical History:  Diagnosis Date  . Anemia   . Anxiety   . Anxiety disorder   . Arthritis    "pretty much all over" (01/05/2016)  . Asthma   . Atrial fibrillation ([place])   . Avascular necrosis of hip ([place])    left  . CHF (congestive heart failure) ([place])   . Chronic bronchitis ([place])   . Chronic lower back pain   . COPD (chronic obstructive pulmonary disease) ([place])   . Depression   . ETOH abuse   . Heart murmur    "think I outgrew it" (01/05/2016)  . High cholesterol   . History of blood transfusion 12/2015   "while in ICU I think" (01/05/2016)  . HTN (hypertension)   . Hyperglycemia   . On home O2    "2.5-3L 24/7" (01/05/2016)  . Pneumonia    "several times"  . Psoriatic arthritis ([place])    "we give her injections of Humera twice/month unless she's ill; think the last one she had was 08/23/2015" (01/05/2016)    Past Surgical History:  Procedure Laterality Date  . JOINT REPLACEMENT    . TONSILLECTOMY AND ADENOIDECTOMY    . TOTAL HIP ARTHROPLASTY Right 05/15/2008  . TUBAL LIGATION      There were no vitals filed for this visit.        [facility] PT Assessment - 03/08/16 0001      Assessment   Medical Diagnosis L LE decubitus ulcer    Referring Provider Chevis Pretty    Onset Date/Surgical Date --  2-3 months ago   Next MD Visit not scheduled      Precautions   Precaution Comments Nickel allergy; low immune system due to [facility] use; vascular concerns in LEs      Balance Screen   Has the patient fallen in the past 6 months Yes   How many times? multiple, cannot remember how many    Has the patient had a decrease in activity level because of a fear of falling?  Yes   Is the patient reluctant to leave their home because of a fear of falling?  No     Prior Function   Level of Independence Requires assistive device for independence;Independent with gait;Independent with transfers   Vocation Retired         Wound Therapy - 03/08/16 1700    Subjective Patient and her family state that this fall she has been in and out of the hospital with ongoing and critical illnesses, and sometime over the past 2-3 months she sustained the wounds on her L LE when she was  in SNF care at [facility]. She has fallen several times and they think that these wounds are related to one of her falls, maybe the [facility] tore her leg open as she was falling. MD has been dressing wounds and recently referred patietn to this wound center for further care.    Patient and Family Stated Goals full wound healing    Date of Onset --  2-3 months ago    Prior Treatments Dressings from MD    Pain Assessment 0-10   Pain Score --  "i have pain", did not rate numerically    Pain Type Chronic pain   Pain Location Leg   Pain Orientation Left;Posterior   Evaluation and Treatment Procedures Explained to Patient/Family Yes   Evaluation and Treatment Procedures agreed to   Wound Properties Date First Assessed: 03/08/16 Wound Type: Other (Comment);Laceration , non-healing wound   Location: Leg Location Orientation: Left;Lower Wound Description (Comments): L lower LE  Present on Admission: Yes   Dressing  Type Adhesive bandage;Gauze (Comment)   Dressing Changed Changed   Dressing Status Old drainage   Dressing Change Frequency PRN   % Wound base Red or Granulating 5%   % Wound base Yellow/Fibrinous Exudate 95%   % Wound base Black/Eschar 0%   Peri-wound Assessment Pink   Wound Length (cm) 4 cm   Wound Width (cm) 2 cm   Wound Depth (cm) --  appears to be skin depth    Margins Unattached edges (unapproximated)   Closure None   Drainage Amount Moderate   Drainage Description Purulent   Treatment Cleansed;Packing (Impregnated strip)  cleansed, medihoney, gauze/medipore    Wound Properties Date First Assessed: 03/08/16 Wound Type: Other (Comment) , non-healing wound   Location: Heel Location Orientation: Left Wound Description (Comments): L heel wound  Present on Admission: Yes   Dressing Type Adhesive bandage   Dressing Changed Changed   Dressing Status Old drainage   Dressing Change Frequency PRN   % Wound base Red or Granulating 0%   % Wound base Yellow/Fibrinous Exudate 100%   % Wound base Black/Eschar 0%   Wound Length (cm) 2 cm   Wound Width (cm) 1.5 cm   Wound Depth (cm) --  0.2cm-0.3cm approximately    Margins Unattached edges (unapproximated)   Closure None   Drainage Amount Moderate   Drainage Description Purulent   Treatment Cleansed;Packing (Impregnated strip);Other (Comment)  cleansed, medihoney, gauze/medipore    Wound Therapy - Clinical Statement  Patient arrives today with two wounds present on L LE which she and her family believe she must have sustained sometime over the past 2-3 months when she was in and out of the hospital/at SNF while she was having multiple falls. Patient reports she is allergic to nickel and is also on [place] which does reduce her immune system function; she also reports vascular concerns per an arterial US done on her LEs. Examination reveals two wounds, one on L lower leg and one on L heel, both containing a large amount of slough and moderate  drainage however no significant odor noted today. Unable to debride due to patient/family being very talkative and DPT needing to provide frequent redirection during evaluation. Dressed wounds with medihoney rather than silver secondary to metal allergy, applied gauze and medipore tape as well. Recommend that patient receive skilled PT services to facilitate full wound healing and prevent further wound formation.    Wound Therapy - Functional Problem List non-healing wounds, pain    Factors Delaying/Impairing Wound  Healing Immobility;Multiple medical problems;Polypharmacy;Substance abuse;Vascular compromise;Other (comment)  low immune system secondary to [facility] use    Wound Therapy - Frequency Other (comment)  2x/week    Wound Therapy - Current Recommendations PT   Wound Plan debridement, medihoney, gauze/medipore. METAL ALLERGY, NO SILVER.                          PT Education - 03/08/16 1715    Education provided Yes   Education Details [place], POC; do not soak wounds in salt water due to risk of spreading infection/worsening condition of wounds; leave dressings alone unless they fall off, in this case cover with antibiotic cream and gauze, do not get dressings wet    Person(s) Educated Patient;Spouse;Child(ren)   Methods Explanation   Comprehension Verbalized understanding;Need further instruction          PT Short Term Goals - 03/08/16 1717      PT SHORT TERM GOAL #1   Title All wounds to demonstrate 100% granulation tissue with no slough and minimal to no drainage in order to show general improvement in condition    Time 3   Period Weeks   Status New     PT SHORT TERM GOAL #2   Title All wounds to have reduced by at least 50% on all measured dimensions in order to show general improvement of condition    Time 3   Period Weeks   Status New           PT Long Term Goals - 03/08/16 1718      PT LONG TERM GOAL #1   Title Patient to experience pain no  more than 1/10 at wound sites in order to improve QOL and mobility    Time 6   Period Weeks   Status New     PT LONG TERM GOAL #2   Title All wounds to have resolved and fully healed with no further skilled wound care services required    Time 6   Period Weeks   Status New            Patient will benefit from skilled therapeutic intervention in order to improve the following deficits and impairments:     Visit Diagnosis: Unspecified open wound, left lower leg, sequela - Plan: PT plan of care cert/re-cert  Pain in left lower leg - Plan: PT plan of care cert/re-cert  Difficulty in walking, not elsewhere classified - Plan: PT plan of care cert/re-cert      G-Codes - [id number]    Functional Assessment Tool Used Based on skilled clinical assessment of wound healing status, presence of slough, pain, factors impairing wound healing    Functional Limitation Other PT primary   Other PT Primary Current Status [id number]) At least 60 percent but less than 80 percent impaired, limited or restricted   Other PT Primary Goal Status [id number]) At least 40 percent but less than 60 percent impaired, limited or restricted      Problem List Patient Active Problem List   Diagnosis Date Noted  . Pressure injury of skin 12/30/2015  . Seizure ([place])   . Respiratory failure ([place]) 12/29/2015  . HCAP (healthcare-associated pneumonia) 12/19/2015  . Hypercarbia 12/19/2015  . Hypokalemia 12/19/2015  . Protein-calorie malnutrition, severe 10/18/2015  . Anxiety 10/15/2015  . Chronic diastolic CHF (congestive heart failure) ([place]) 10/15/2015  . Macrocytic anemia 10/15/2015  . Acute respiratory failure with hypoxia and hypercapnia (HCC)   . Atherosclerotic  peripheral vascular disease with intermittent claudication ([place]) 06/09/2014  . FTT (failure to thrive) in adult 07/31/2013  . CHF (congestive heart failure) ([place]) 06/10/2013  . 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 Osteopenia 06/16/2010  .  Hyperlipidemia 06/16/2010  . HTN (hypertension) 06/16/2010  . Insomnia 06/16/2010  . RA (rheumatoid arthritis) ([place]) 06/16/2010  . RHEUMATIC FEVER, HX OF 04/20/2009  . COMPUTERIZED TOMOGRAPHY, CHEST, ABNORMAL 04/20/2009  . COPD exacerbation ([place]) 04/18/2009    Deniece Ree PT, DPT [place] [address] [place], [place], [address] Phone: [phone]   Fax:  [phone]  Name: [name] MRN: [id number] Date of Birth: [birth year]

## 2016-03-13 ENCOUNTER — Telehealth: Payer: Self-pay | Admitting: Family Medicine

## 2016-03-14 NOTE — Telephone Encounter (Signed)
Spoke to family on the phone and they will bring her in today for a wound check

## 2016-03-19 ENCOUNTER — Telehealth: Payer: Self-pay | Admitting: Nurse Practitioner

## 2016-03-19 NOTE — Telephone Encounter (Signed)
Appointment made 03/21/16 at 4:00 pm with dr. Sabra Heck, offered appointment 03/20/16 morning but cannot come in then

## 2016-03-20 ENCOUNTER — Telehealth (HOSPITAL_COMMUNITY): Payer: Self-pay

## 2016-03-20 ENCOUNTER — Ambulatory Visit (HOSPITAL_COMMUNITY): Payer: Medicare Other

## 2016-03-20 NOTE — Telephone Encounter (Signed)
Husband states she will see Dr Sabra Heck tomorrow and then they will let us know what they want to do.

## 2016-03-21 ENCOUNTER — Ambulatory Visit (INDEPENDENT_AMBULATORY_CARE_PROVIDER_SITE_OTHER): Payer: Medicare Other

## 2016-03-21 ENCOUNTER — Ambulatory Visit (INDEPENDENT_AMBULATORY_CARE_PROVIDER_SITE_OTHER): Payer: Medicare Other | Admitting: Family Medicine

## 2016-03-21 ENCOUNTER — Encounter: Payer: Self-pay | Admitting: Family Medicine

## 2016-03-21 VITALS — BP 89/52 | HR 71 | Temp 98.4°F | Ht <= 58 in | Wt 81.0 lb

## 2016-03-21 DIAGNOSIS — M25551 Pain in right hip: Secondary | ICD-10-CM | POA: Diagnosis not present

## 2016-03-21 DIAGNOSIS — S72114A Nondisplaced fracture of greater trochanter of right femur, initial encounter for closed fracture: Secondary | ICD-10-CM | POA: Diagnosis not present

## 2016-03-21 DIAGNOSIS — W19XXXA Unspecified fall, initial encounter: Secondary | ICD-10-CM

## 2016-03-21 DIAGNOSIS — M79671 Pain in right foot: Secondary | ICD-10-CM

## 2016-03-21 DIAGNOSIS — M79672 Pain in left foot: Secondary | ICD-10-CM

## 2016-03-21 MED ORDER — HYDROCODONE-ACETAMINOPHEN 10-325 MG PO TABS: 1.0000 | ORAL_TABLET | Freq: Two times a day (BID) | ORAL | 0 refills | Status: DC

## 2016-03-21 NOTE — Progress Notes (Signed)
Subjective:    Patient ID: [name], female    DOB: 12/12/1945, 71 y.o.   MRN: [id number]  HPI Patient here today for bilateral foot pain and right hip pain from a fall. Patient is status post right hip fracture with prosthetic hip in place. Her pain is primarily in that right hip as well as both feet. She has wounds on both feet as well as her left lower leg.  Patient Active Problem List   Diagnosis Date Noted  . Pressure injury of skin 12/30/2015  . Seizure ([place])   . Respiratory failure ([place]) 12/29/2015  . HCAP (healthcare-associated pneumonia) 12/19/2015  . Hypercarbia 12/19/2015  . Hypokalemia 12/19/2015  . Protein-calorie malnutrition, severe 10/18/2015  . Anxiety 10/15/2015  . Chronic diastolic CHF (congestive heart failure) ([place]) 10/15/2015  . Macrocytic anemia 10/15/2015  . Acute respiratory failure with hypoxia and hypercapnia (H[facility])   . Atherosclerotic peripheral vascular disease with intermittent claudication ([place]) 06/09/2014  . FTT (failure to thrive) in adult 07/31/2013  . CHF (congestive heart failure) ([place]) 06/10/2013  . 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 Osteopenia 06/16/2010  . Hyperlipidemia 06/16/2010  . HTN (hypertension) 06/16/2010  . Insomnia 06/16/2010  . RA (rheumatoid arthritis) ([place]) 06/16/2010  . RHEUMATIC FEVER, HX OF 04/20/2009  . COMPUTERIZED TOMOGRAPHY, CHEST, ABNORMAL 04/20/2009  . COPD exacerbation ([place]) 04/18/2009   Outpatient Encounter Prescriptions as of 03/21/2016  Medication Sig  . albuterol (PROAIR HFA) 108 (90 BASE) MCG/ACT inhaler INHALE 2 PUFFS INTO THE LUNGS EVERY 6 HOURS AS NEEDED FOR WHEEZING OR SHORTNESS OF BREATH  . albuterol (PROVENTIL) (2.5 MG/3ML) 0.083% nebulizer solution Take 2.5 mg by nebulization every morning.  Marland Kitchen ALPRAZolam (XANAX) 0.5 MG tablet Take 1 tablet (0.5 mg total) by mouth 2 (two) times daily as needed for anxiety.  Marland Kitchen amitriptyline (ELAVIL) 50 MG tablet Take 1 tablet by mouth at  bedtime  . amoxicillin (AMOXIL)  875 MG tablet Take 1 tablet (875 mg total) by mouth 2 (two) times daily. 1 po BID  . aspirin 325 MG tablet Take 325 mg by mouth every evening.   Marland Kitchen buPROPion (WELLBUTRIN XL) 150 MG 24 hr tablet TAKE 1 TABLET BY MOUTH  DAILY  . cetirizine (ALL DAY ALLERGY) 10 MG tablet Take 10 mg by mouth at bedtime.   . cholecalciferol (VITAMIN D) 1000 UNITS tablet Take 1,000 Units by mouth every morning.   Marland Kitchen doxycycline (VIBRA-TABS) 100 MG tablet Take 100 mg by mouth 2 (two) times daily.  Marland Kitchen EQL CALCIUM CITRATE/VITAMIN D PO Take 1 each by mouth 2 (two) times daily. Vitamin D 500IU with Calcium Citrate 630mg   Chewables  . Fluticasone Furoate-Vilanterol (BREO ELLIPTA) 100-25 MCG/INH AEPB Inhale 1 puff into the lungs daily.   . furosemide (LASIX) 20 MG tablet Take 1 tablet by mouth  daily (Patient taking differently: Take 1 tablet by mouth  daily in the morning)  . guaifenesin (MUCUS RELIEF) 400 MG TABS tablet Take 400 mg by mouth every morning.   Marland Kitchen HYDROcodone-acetaminophen (NORCO) 10-325 MG tablet Take 1 tablet by mouth 2 (two) times daily.  Marland Kitchen HYDROcodone-homatropine (HYCODAN) 5-1.5 MG/5ML syrup Take 5 mLs by mouth every 6 (six) hours as needed for cough.  . metoprolol (LOPRESSOR) 50 MG tablet Take 1 tablet by mouth two  times daily  . potassium chloride SA (K-DUR,KLOR-CON) 20 MEQ tablet Take 2 tablets (40 mEq total) by mouth daily.  . predniSONE (DELTASONE) 20 MG tablet Take 40mg  for 2days then 20mg  for 2days then STOP  .  tiotropium (SPIRIVA) 18 MCG inhalation capsule Place 1 capsule (18 mcg total) into inhaler and inhale every morning.  . vitamin C (ASCORBIC ACID) 500 MG tablet Take 500 mg by mouth every morning.   No facility-administered encounter medications on file as of 03/21/2016.       Review of Systems  Constitutional: Negative.   HENT: Negative.   Eyes: Negative.   Respiratory: Negative.   Cardiovascular: Negative.   Gastrointestinal: Negative.   Endocrine: Negative.   Genitourinary: Negative.     Musculoskeletal: Positive for arthralgias (bilateral foot pain and right hip pain).  Skin: Negative.   Allergic/Immunologic: Negative.   Neurological: Negative.   Hematological: Negative.   Psychiatric/Behavioral: Negative.        Objective:   Physical Exam  Constitutional:  Patient very thin and ill appearing  Musculoskeletal:  There are no deformities on the feet or hips. There is some tenderness with on palpation of the greater trochanter and x-ray confirms nondisplaced fracture. This is not weightbearing part of the right lower extremity and I do not see that treatment is warranted.    BP (!) 89/52 (BP Location: Left Arm)   Pulse 71   Temp 98.4 F (36.9 C) (Oral)   Ht 4\' 10"  (1.473 m)   Wt 81 lb (36.7 kg)   BMI 16.93 kg/m        Assessment & Plan:  1. Left foot pain X-ray shows no evidence of fracture - DG Foot Complete Left; Future  2. Right hip pain Nondisplaced fracture greater trochanter. Weightbearing as tolerated. Refill hydrocodone to take as needed for pain - DG HIP UNILAT W OR W/O PELVIS 2-3 VIEWS RIGHT; Future  3. Fall, initial encounter X-ray studies as detailed above and below - DG Foot Complete Left; Future - DG HIP UNILAT W OR W/O PELVIS 2-3 VIEWS RIGHT; Future  4. Right foot pain X-ray reveals no fracture assume contusion - DG Foot Complete Right; Future  5. Closed nondisplaced fracture of greater trochanter of right femur, initial encounter ([place]) Treat pain but do not think she requires any orthopedic intervention.  Wardell Honour MD

## 2016-03-25 ENCOUNTER — Telehealth (HOSPITAL_COMMUNITY): Payer: Self-pay | Admitting: Physical Therapy

## 2016-03-25 ENCOUNTER — Telehealth: Payer: Self-pay | Admitting: Nurse Practitioner

## 2016-03-25 ENCOUNTER — Ambulatory Visit (HOSPITAL_COMMUNITY): Payer: Medicare Other | Attending: Nurse Practitioner | Admitting: Physical Therapy

## 2016-03-25 NOTE — Telephone Encounter (Signed)
Pt did not show for appointment.  Called residence and spoke to husband [name]) who request we discharge patient at this time due to intentions on taking for surgical debridement.  Spouse reported he plans on taking her to ED for surgical debridement.  Explained to patient we do debridement here, however states his wife cannot tolerate unless she is medicated or numb.  Informed evaluating therapist to discharge at this time per request. Teena Irani, PTA/CLT [phone]

## 2016-03-26 ENCOUNTER — Ambulatory Visit: Payer: Medicare Other | Admitting: Family Medicine

## 2016-03-26 DIAGNOSIS — J449 Chronic obstructive pulmonary disease, unspecified: Secondary | ICD-10-CM | POA: Diagnosis not present

## 2016-03-26 DIAGNOSIS — E785 Hyperlipidemia, unspecified: Secondary | ICD-10-CM | POA: Diagnosis not present

## 2016-03-27 ENCOUNTER — Ambulatory Visit (HOSPITAL_COMMUNITY): Payer: Medicare Other | Admitting: Physical Therapy

## 2016-03-27 NOTE — Telephone Encounter (Signed)
Patient is wanting Hip x-ray results that were taken 03/21/16. Please review and advice and send back to the pools.

## 2016-03-27 NOTE — Telephone Encounter (Signed)
Patient aware per phone note.

## 2016-03-27 NOTE — Telephone Encounter (Signed)
Spoke to [name] on telephone today. I had left message last week telling them of the fracture in the greater trochanter and that I did not think this warranted any treatment by orthopedic doctor but that I would be glad to make him an appointment to make sure. Today on our phone conversation, he was not interested in orthopedic follow-up but agreed to just treat the pain as per our office visit

## 2016-03-31 ENCOUNTER — Encounter (HOSPITAL_COMMUNITY): Payer: Self-pay | Admitting: Emergency Medicine

## 2016-03-31 ENCOUNTER — Emergency Department (HOSPITAL_COMMUNITY): Payer: Medicare Other

## 2016-03-31 ENCOUNTER — Inpatient Hospital Stay (HOSPITAL_COMMUNITY)
Admission: EM | Admit: 2016-03-31 | Discharge: 2016-04-12 | DRG: 870 | Disposition: A | Discharge status: Hospice | Payer: Medicare Other | Attending: Family Medicine | Admitting: Family Medicine

## 2016-03-31 DIAGNOSIS — J189 Pneumonia, unspecified organism: Secondary | ICD-10-CM | POA: Diagnosis not present

## 2016-03-31 DIAGNOSIS — J440 Chronic obstructive pulmonary disease with acute lower respiratory infection: Secondary | ICD-10-CM | POA: Diagnosis present

## 2016-03-31 DIAGNOSIS — J9602 Acute respiratory failure with hypercapnia: Secondary | ICD-10-CM | POA: Diagnosis present

## 2016-03-31 DIAGNOSIS — M545 Low back pain: Secondary | ICD-10-CM | POA: Diagnosis present

## 2016-03-31 DIAGNOSIS — M069 Rheumatoid arthritis, unspecified: Secondary | ICD-10-CM | POA: Diagnosis present

## 2016-03-31 DIAGNOSIS — E876 Hypokalemia: Secondary | ICD-10-CM | POA: Diagnosis present

## 2016-03-31 DIAGNOSIS — R1311 Dysphagia, oral phase: Secondary | ICD-10-CM | POA: Diagnosis present

## 2016-03-31 DIAGNOSIS — J9621 Acute and chronic respiratory failure with hypoxia: Secondary | ICD-10-CM | POA: Diagnosis present

## 2016-03-31 DIAGNOSIS — R64 Cachexia: Secondary | ICD-10-CM | POA: Diagnosis present

## 2016-03-31 DIAGNOSIS — Z820 Family history of epilepsy and other diseases of the nervous system: Secondary | ICD-10-CM

## 2016-03-31 DIAGNOSIS — Z8614 Personal history of Methicillin resistant Staphylococcus aureus infection: Secondary | ICD-10-CM

## 2016-03-31 DIAGNOSIS — A419 Sepsis, unspecified organism: Principal | ICD-10-CM | POA: Diagnosis present

## 2016-03-31 DIAGNOSIS — J151 Pneumonia due to Pseudomonas: Secondary | ICD-10-CM | POA: Diagnosis present

## 2016-03-31 DIAGNOSIS — Z7982 Long term (current) use of aspirin: Secondary | ICD-10-CM

## 2016-03-31 DIAGNOSIS — R0902 Hypoxemia: Secondary | ICD-10-CM | POA: Diagnosis not present

## 2016-03-31 DIAGNOSIS — Z452 Encounter for adjustment and management of vascular access device: Secondary | ICD-10-CM | POA: Diagnosis not present

## 2016-03-31 DIAGNOSIS — Z681 Body mass index (BMI) 19 or less, adult: Secondary | ICD-10-CM | POA: Diagnosis not present

## 2016-03-31 DIAGNOSIS — R680 Hypothermia, not associated with low environmental temperature: Secondary | ICD-10-CM | POA: Diagnosis present

## 2016-03-31 DIAGNOSIS — S72141A Displaced intertrochanteric fracture of right femur, initial encounter for closed fracture: Secondary | ICD-10-CM | POA: Diagnosis not present

## 2016-03-31 DIAGNOSIS — R1313 Dysphagia, pharyngeal phase: Secondary | ICD-10-CM | POA: Diagnosis present

## 2016-03-31 DIAGNOSIS — S72111A Displaced fracture of greater trochanter of right femur, initial encounter for closed fracture: Secondary | ICD-10-CM | POA: Diagnosis present

## 2016-03-31 DIAGNOSIS — M869 Osteomyelitis, unspecified: Secondary | ICD-10-CM | POA: Diagnosis present

## 2016-03-31 DIAGNOSIS — L4050 Arthropathic psoriasis, unspecified: Secondary | ICD-10-CM | POA: Diagnosis present

## 2016-03-31 DIAGNOSIS — Z66 Do not resuscitate: Secondary | ICD-10-CM | POA: Diagnosis present

## 2016-03-31 DIAGNOSIS — Z87891 Personal history of nicotine dependence: Secondary | ICD-10-CM

## 2016-03-31 DIAGNOSIS — S81809A Unspecified open wound, unspecified lower leg, initial encounter: Secondary | ICD-10-CM | POA: Diagnosis present

## 2016-03-31 DIAGNOSIS — Z888 Allergy status to other drugs, medicaments and biological substances status: Secondary | ICD-10-CM

## 2016-03-31 DIAGNOSIS — Z833 Family history of diabetes mellitus: Secondary | ICD-10-CM

## 2016-03-31 DIAGNOSIS — E46 Unspecified protein-calorie malnutrition: Secondary | ICD-10-CM | POA: Diagnosis not present

## 2016-03-31 DIAGNOSIS — Z978 Presence of other specified devices: Secondary | ICD-10-CM

## 2016-03-31 DIAGNOSIS — G8929 Other chronic pain: Secondary | ICD-10-CM | POA: Diagnosis present

## 2016-03-31 DIAGNOSIS — F411 Generalized anxiety disorder: Secondary | ICD-10-CM | POA: Diagnosis present

## 2016-03-31 DIAGNOSIS — J449 Chronic obstructive pulmonary disease, unspecified: Secondary | ICD-10-CM | POA: Diagnosis present

## 2016-03-31 DIAGNOSIS — J9620 Acute and chronic respiratory failure, unspecified whether with hypoxia or hypercapnia: Secondary | ICD-10-CM | POA: Diagnosis present

## 2016-03-31 DIAGNOSIS — I110 Hypertensive heart disease with heart failure: Secondary | ICD-10-CM | POA: Diagnosis present

## 2016-03-31 DIAGNOSIS — M25551 Pain in right hip: Secondary | ICD-10-CM | POA: Diagnosis not present

## 2016-03-31 DIAGNOSIS — I5032 Chronic diastolic (congestive) heart failure: Secondary | ICD-10-CM | POA: Diagnosis present

## 2016-03-31 DIAGNOSIS — I959 Hypotension, unspecified: Secondary | ICD-10-CM | POA: Diagnosis not present

## 2016-03-31 DIAGNOSIS — Z9981 Dependence on supplemental oxygen: Secondary | ICD-10-CM

## 2016-03-31 DIAGNOSIS — I10 Essential (primary) hypertension: Secondary | ICD-10-CM | POA: Diagnosis not present

## 2016-03-31 DIAGNOSIS — F419 Anxiety disorder, unspecified: Secondary | ICD-10-CM | POA: Diagnosis present

## 2016-03-31 DIAGNOSIS — E785 Hyperlipidemia, unspecified: Secondary | ICD-10-CM | POA: Diagnosis present

## 2016-03-31 DIAGNOSIS — J430 Unilateral pulmonary emphysema [MacLeod's syndrome]: Secondary | ICD-10-CM | POA: Diagnosis not present

## 2016-03-31 DIAGNOSIS — Z01818 Encounter for other preprocedural examination: Secondary | ICD-10-CM

## 2016-03-31 DIAGNOSIS — L97929 Non-pressure chronic ulcer of unspecified part of left lower leg with unspecified severity: Secondary | ICD-10-CM | POA: Diagnosis present

## 2016-03-31 DIAGNOSIS — R531 Weakness: Secondary | ICD-10-CM | POA: Diagnosis present

## 2016-03-31 DIAGNOSIS — A4102 Sepsis due to Methicillin resistant Staphylococcus aureus: Secondary | ICD-10-CM | POA: Diagnosis not present

## 2016-03-31 DIAGNOSIS — L97919 Non-pressure chronic ulcer of unspecified part of right lower leg with unspecified severity: Secondary | ICD-10-CM | POA: Diagnosis not present

## 2016-03-31 DIAGNOSIS — Z8249 Family history of ischemic heart disease and other diseases of the circulatory system: Secondary | ICD-10-CM

## 2016-03-31 DIAGNOSIS — J441 Chronic obstructive pulmonary disease with (acute) exacerbation: Secondary | ICD-10-CM | POA: Diagnosis present

## 2016-03-31 DIAGNOSIS — J90 Pleural effusion, not elsewhere classified: Secondary | ICD-10-CM | POA: Diagnosis not present

## 2016-03-31 DIAGNOSIS — D649 Anemia, unspecified: Secondary | ICD-10-CM | POA: Diagnosis present

## 2016-03-31 DIAGNOSIS — D72829 Elevated white blood cell count, unspecified: Secondary | ICD-10-CM

## 2016-03-31 DIAGNOSIS — I4891 Unspecified atrial fibrillation: Secondary | ICD-10-CM | POA: Diagnosis not present

## 2016-03-31 DIAGNOSIS — R627 Adult failure to thrive: Secondary | ICD-10-CM | POA: Diagnosis present

## 2016-03-31 DIAGNOSIS — S81809D Unspecified open wound, unspecified lower leg, subsequent encounter: Secondary | ICD-10-CM

## 2016-03-31 DIAGNOSIS — D696 Thrombocytopenia, unspecified: Secondary | ICD-10-CM | POA: Diagnosis not present

## 2016-03-31 DIAGNOSIS — R739 Hyperglycemia, unspecified: Secondary | ICD-10-CM | POA: Diagnosis present

## 2016-03-31 DIAGNOSIS — E7800 Pure hypercholesterolemia, unspecified: Secondary | ICD-10-CM | POA: Diagnosis present

## 2016-03-31 DIAGNOSIS — Y95 Nosocomial condition: Secondary | ICD-10-CM | POA: Diagnosis present

## 2016-03-31 DIAGNOSIS — R918 Other nonspecific abnormal finding of lung field: Secondary | ICD-10-CM | POA: Diagnosis not present

## 2016-03-31 DIAGNOSIS — J9611 Chronic respiratory failure with hypoxia: Secondary | ICD-10-CM | POA: Diagnosis not present

## 2016-03-31 DIAGNOSIS — Z809 Family history of malignant neoplasm, unspecified: Secondary | ICD-10-CM

## 2016-03-31 DIAGNOSIS — G9340 Encephalopathy, unspecified: Secondary | ICD-10-CM | POA: Diagnosis not present

## 2016-03-31 DIAGNOSIS — J9622 Acute and chronic respiratory failure with hypercapnia: Secondary | ICD-10-CM | POA: Diagnosis not present

## 2016-03-31 DIAGNOSIS — Z4682 Encounter for fitting and adjustment of non-vascular catheter: Secondary | ICD-10-CM | POA: Diagnosis not present

## 2016-03-31 DIAGNOSIS — Z96641 Presence of right artificial hip joint: Secondary | ICD-10-CM | POA: Diagnosis present

## 2016-03-31 DIAGNOSIS — F329 Major depressive disorder, single episode, unspecified: Secondary | ICD-10-CM | POA: Diagnosis present

## 2016-03-31 DIAGNOSIS — J9690 Respiratory failure, unspecified, unspecified whether with hypoxia or hypercapnia: Secondary | ICD-10-CM | POA: Diagnosis not present

## 2016-03-31 DIAGNOSIS — Z9109 Other allergy status, other than to drugs and biological substances: Secondary | ICD-10-CM

## 2016-03-31 LAB — CBC WITH DIFFERENTIAL/PLATELET
BASOS ABS: 0 10*3/uL (ref 0.0–0.1)
Basophils Relative: 0 %
Eosinophils Absolute: 0 10*3/uL (ref 0.0–0.7)
Eosinophils Relative: 0 %
HCT: 31.8 % — ABNORMAL LOW (ref 36.0–46.0)
Hemoglobin: 9.1 g/dL — ABNORMAL LOW (ref 12.0–15.0)
LYMPHS PCT: 11 %
Lymphs Abs: 2.3 10*3/uL (ref 0.7–4.0)
MCH: 27.8 pg (ref 26.0–34.0)
MCHC: 28.6 g/dL — ABNORMAL LOW (ref 30.0–36.0)
MCV: 97.2 fL (ref 78.0–100.0)
Monocytes Absolute: 1.7 10*3/uL — ABNORMAL HIGH (ref 0.1–1.0)
Monocytes Relative: 8 %
NEUTROS ABS: 17.7 10*3/uL — AB (ref 1.7–7.7)
NEUTROS PCT: 81 %
Platelets: 463 10*3/uL — ABNORMAL HIGH (ref 150–400)
RBC: 3.27 MIL/uL — AB (ref 3.87–5.11)
RDW: 16.4 % — ABNORMAL HIGH (ref 11.5–15.5)
WBC: 21.8 10*3/uL — AB (ref 4.0–10.5)

## 2016-03-31 LAB — CBG MONITORING, ED: GLUCOSE-CAPILLARY: 74 mg/dL (ref 65–99)

## 2016-03-31 LAB — I-STAT TROPONIN, ED: Troponin i, poc: 0 ng/mL (ref 0.00–0.08)

## 2016-03-31 LAB — URINALYSIS, ROUTINE W REFLEX MICROSCOPIC
BILIRUBIN URINE: NEGATIVE
GLUCOSE, UA: NEGATIVE mg/dL
HGB URINE DIPSTICK: NEGATIVE
Ketones, ur: 20 mg/dL — AB
NITRITE: NEGATIVE
PROTEIN: 30 mg/dL — AB
SPECIFIC GRAVITY, URINE: 1.017 (ref 1.005–1.030)
pH: 6 (ref 5.0–8.0)

## 2016-03-31 LAB — COMPREHENSIVE METABOLIC PANEL
ALBUMIN: 2 g/dL — AB (ref 3.5–5.0)
ALT: 12 U/L — ABNORMAL LOW (ref 14–54)
ANION GAP: 21 — AB (ref 5–15)
AST: 26 U/L (ref 15–41)
Alkaline Phosphatase: 101 U/L (ref 38–126)
BILIRUBIN TOTAL: 1.1 mg/dL (ref 0.3–1.2)
BUN: 7 mg/dL (ref 6–20)
CO2: 44 mmol/L — ABNORMAL HIGH (ref 22–32)
Calcium: 8.6 mg/dL — ABNORMAL LOW (ref 8.9–10.3)
Chloride: 74 mmol/L — ABNORMAL LOW (ref 101–111)
Creatinine, Ser: 0.99 mg/dL (ref 0.44–1.00)
GFR, EST NON AFRICAN AMERICAN: 56 mL/min — AB (ref >60)
Glucose, Bld: 64 mg/dL — ABNORMAL LOW (ref 65–99)
POTASSIUM: 2.3 mmol/L — AB (ref 3.5–5.1)
Sodium: 139 mmol/L (ref 135–145)
TOTAL PROTEIN: 7 g/dL (ref 6.5–8.1)

## 2016-03-31 LAB — I-STAT CG4 LACTIC ACID, ED
Lactic Acid, Venous: 5.71 mmol/L (ref 0.5–1.9)
Lactic Acid, Venous: 1.07 mmol/L (ref 0.5–1.9)

## 2016-03-31 LAB — POC OCCULT BLOOD, ED: FECAL OCCULT BLD: NEGATIVE

## 2016-03-31 LAB — LIPASE, BLOOD: LIPASE: 15 U/L (ref 11–51)

## 2016-03-31 MED ORDER — AMITRIPTYLINE HCL 50 MG PO TABS
50.0000 mg | ORAL_TABLET | Freq: Every day | ORAL | Status: DC
  Administered 2016-04-01 – 2016-04-11 (×10): 50 mg via ORAL
  Filled 2016-03-31 (×4): qty 1
  Filled 2016-03-31: qty 2
  Filled 2016-03-31 (×7): qty 1

## 2016-03-31 MED ORDER — SODIUM CHLORIDE 0.9 % IV BOLUS (SEPSIS)
250.0000 mL | Freq: Once | INTRAVENOUS | Status: AC
Start: 2016-03-31 — End: 2016-03-31
  Administered 2016-03-31: 250 mL via INTRAVENOUS

## 2016-03-31 MED ORDER — VANCOMYCIN HCL 500 MG IV SOLR
500.0000 mg | INTRAVENOUS | Status: DC
  Administered 2016-04-01 – 2016-04-03 (×3): 500 mg via INTRAVENOUS
  Filled 2016-03-31 (×4): qty 500

## 2016-03-31 MED ORDER — ONDANSETRON HCL 4 MG/2ML IJ SOLN: 4.0000 mg | Freq: Four times a day (QID) | INTRAMUSCULAR | Status: DC | PRN

## 2016-03-31 MED ORDER — POTASSIUM CHLORIDE CRYS ER 20 MEQ PO TBCR: 40.0000 meq | EXTENDED_RELEASE_TABLET | Freq: Every day | ORAL | Status: DC

## 2016-03-31 MED ORDER — PIPERACILLIN-TAZOBACTAM 3.375 G IVPB 30 MIN
3.3750 g | Freq: Once | INTRAVENOUS | Status: AC
  Administered 2016-03-31: 3.375 g via INTRAVENOUS
  Filled 2016-03-31: qty 50

## 2016-03-31 MED ORDER — BUPROPION HCL ER (XL) 150 MG PO TB24
150.0000 mg | ORAL_TABLET | Freq: Every day | ORAL | Status: DC
  Administered 2016-04-02: 150 mg via ORAL
  Filled 2016-03-31 (×2): qty 1

## 2016-03-31 MED ORDER — PIPERACILLIN-TAZOBACTAM 3.375 G IVPB: 3.3750 g | Freq: Three times a day (TID) | INTRAVENOUS | Status: DC

## 2016-03-31 MED ORDER — DEXTROSE 5 % IV SOLN
2.0000 g | Freq: Once | INTRAVENOUS | Status: AC
  Administered 2016-04-01: 2 g via INTRAVENOUS
  Filled 2016-03-31: qty 2

## 2016-03-31 MED ORDER — SODIUM CHLORIDE 0.9 % IV BOLUS (SEPSIS)
1000.0000 mL | Freq: Once | INTRAVENOUS | Status: AC
  Administered 2016-03-31: 1000 mL via INTRAVENOUS

## 2016-03-31 MED ORDER — IPRATROPIUM-ALBUTEROL 0.5-2.5 (3) MG/3ML IN SOLN
3.0000 mL | RESPIRATORY_TRACT | Status: DC | PRN
  Administered 2016-04-01: 3 mL via RESPIRATORY_TRACT
  Filled 2016-03-31: qty 3

## 2016-03-31 MED ORDER — ACETAMINOPHEN 325 MG PO TABS
650.0000 mg | ORAL_TABLET | Freq: Four times a day (QID) | ORAL | Status: DC | PRN
  Administered 2016-04-04: 650 mg via ORAL
  Filled 2016-03-31: qty 2

## 2016-03-31 MED ORDER — POLYETHYLENE GLYCOL 3350 17 G PO PACK
17.0000 g | PACK | Freq: Every day | ORAL | Status: DC | PRN
  Administered 2016-04-05: 17 g via ORAL
  Filled 2016-03-31 (×2): qty 1

## 2016-03-31 MED ORDER — ACETAMINOPHEN 650 MG RE SUPP: 650.0000 mg | Freq: Four times a day (QID) | RECTAL | Status: DC | PRN

## 2016-03-31 MED ORDER — VANCOMYCIN HCL IN DEXTROSE 1-5 GM/200ML-% IV SOLN
1000.0000 mg | Freq: Once | INTRAVENOUS | Status: AC
Start: 2016-03-31 — End: 2016-03-31
  Administered 2016-03-31: 1000 mg via INTRAVENOUS
  Filled 2016-03-31: qty 200

## 2016-03-31 MED ORDER — SODIUM CHLORIDE 0.9% FLUSH
3.0000 mL | INTRAVENOUS | Status: DC | PRN
Start: 2016-03-31 — End: 2016-04-03

## 2016-03-31 MED ORDER — VITAMIN D 1000 UNITS PO TABS
1000.0000 [IU] | ORAL_TABLET | Freq: Every morning | ORAL | Status: DC
  Administered 2016-04-02 – 2016-04-12 (×10): 1000 [IU] via ORAL
  Filled 2016-03-31 (×12): qty 1

## 2016-03-31 MED ORDER — SODIUM CHLORIDE 0.9 % IV BOLUS (SEPSIS)
250.0000 mL | Freq: Once | INTRAVENOUS | Status: AC
  Administered 2016-03-31: 250 mL via INTRAVENOUS

## 2016-03-31 MED ORDER — GUAIFENESIN 200 MG PO TABS
400.0000 mg | ORAL_TABLET | Freq: Two times a day (BID) | ORAL | Status: DC
  Administered 2016-04-01 – 2016-04-05 (×9): 400 mg via ORAL
  Filled 2016-03-31 (×12): qty 2

## 2016-03-31 MED ORDER — ONDANSETRON HCL 4 MG PO TABS: 4.0000 mg | ORAL_TABLET | Freq: Four times a day (QID) | ORAL | Status: DC | PRN

## 2016-03-31 MED ORDER — ALPRAZOLAM 0.5 MG PO TABS
0.5000 mg | ORAL_TABLET | Freq: Two times a day (BID) | ORAL | Status: DC | PRN
  Administered 2016-04-01: 0.5 mg via ORAL
  Filled 2016-03-31: qty 1

## 2016-03-31 MED ORDER — ENOXAPARIN SODIUM 30 MG/0.3ML ~~LOC~~ SOLN
30.0000 mg | SUBCUTANEOUS | Status: DC
  Administered 2016-04-01 – 2016-04-05 (×5): 30 mg via SUBCUTANEOUS
  Filled 2016-03-31 (×7): qty 0.3

## 2016-03-31 MED ORDER — HYDROCODONE-ACETAMINOPHEN 10-325 MG PO TABS
1.0000 | ORAL_TABLET | Freq: Four times a day (QID) | ORAL | Status: DC | PRN
  Administered 2016-04-01: 1 via ORAL
  Filled 2016-03-31: qty 1

## 2016-03-31 MED ORDER — SODIUM CHLORIDE 0.9 % IV SOLN: 250.0000 mL | INTRAVENOUS | Status: DC | PRN

## 2016-03-31 MED ORDER — SODIUM CHLORIDE 0.9% FLUSH
3.0000 mL | Freq: Two times a day (BID) | INTRAVENOUS | Status: DC
  Administered 2016-04-02 (×2): 3 mL via INTRAVENOUS

## 2016-03-31 MED ORDER — SODIUM CHLORIDE 0.9 % IV SOLN
30.0000 meq | Freq: Once | INTRAVENOUS | Status: AC
  Administered 2016-04-01: 30 meq via INTRAVENOUS
  Filled 2016-03-31: qty 15

## 2016-03-31 MED ORDER — ASPIRIN 325 MG PO TABS
325.0000 mg | ORAL_TABLET | Freq: Every evening | ORAL | Status: DC
  Administered 2016-04-01 – 2016-04-11 (×10): 325 mg via ORAL
  Filled 2016-03-31 (×11): qty 1

## 2016-03-31 MED ORDER — SODIUM CHLORIDE 0.9% FLUSH
3.0000 mL | Freq: Two times a day (BID) | INTRAVENOUS | Status: DC
  Administered 2016-04-01 – 2016-04-02 (×3): 3 mL via INTRAVENOUS

## 2016-03-31 MED ORDER — BISACODYL 10 MG RE SUPP: 10.0000 mg | Freq: Every day | RECTAL | Status: DC | PRN

## 2016-03-31 NOTE — ED Notes (Signed)
Checked CBG 74, RN Clarise Cruz informed

## 2016-03-31 NOTE — H&P (Signed)
History and Physical    [name] [id number] DOB: 1945/09/17 DOA: 03/31/2016  PCP: Chevis Pretty, FNP   Patient coming from: Home  Chief Complaint: Productive cough, gen weakness, lethargy, poor appetite  HPI: [name] is a 71 y.o. female with medical history significant for COPD with chronic respiratory failure, hypertension, depression, anxiety, chronic diastolic CHF, and severe protein calorie malnutrition who presents to the emergency department for evaluation of productive cough, generalized weakness, loss of appetite, and lethargy. Patient is accompanied by her husband and daughter who assist with the history. Patient is had 3 hospital admissions in the last 5 months, requiring intubation and ICU stays, and the patient has never recovered her strength or return to her prior level of functioning per report of her family. She has been increasingly weak and lethargic with poor appetite, and this was all amplified over the past few days. She has also had worsening in her chronic cough with new production of thick yellow sputum over this interval. She has denied chest pain or palpitations, but is dyspneic with minimal exertion. She has denied nausea, vomiting, diarrhea, or urinary symptoms.  She has chronic open wounds on the bilateral lower extremities that her daughter has been cleaning and dressing. There has been thick white discharge from one of the wounds on the lower left leg, but no foul odor or surrounding erythema. There is been no headaches, confusion, change in vision or hearing, or focal numbness or weakness. No recent long distance travel or sick contacts.   ED Course: Upon arrival to the ED, patient is found to be afebrile, requiring 3 L/m supplemental oxygen to maintain saturations above 90%, tachycardic in the 110s, and with soft blood pressure. EKG features a sinus tachycardia with rate 116 and chest x-rays notable for hyperinflation with chronic interstitial  changes and a slight increased opacity at the left upper lobe and suprahilar left lung concerning for a superimposed infiltrate. Chem strip panels notable for a potassium of 2.3, bicarbonate of 44, and glucose of 64. CBC is notable for a leukocytosis to 21,800, stable chronic normocytic anemia with hemoglobin of 9.1, and a new thrombocytosis with platelets 463,000. Urinalysis features rare bacteria, trace leukocytes, and 6-30 WBCs. Troponin is undetectable, and fecal occult blood testing is negative. Radiographs of the hips demonstrate a prior right hip replacement without acute dislocation, as well as fracture through the greater trochanter of the right femur which was previously noted, but with new superior displacement of the fracture fragment. Blood and urine cultures were obtained, patient was treated with 30 mL/kg normal saline bolus, and empiric vancomycin and Zosyn. Repeat lactic acid had normalized to 1.07 after the fluid. Patient continued to have soft blood pressures, but with MAP above 65. Work of breathing has improved in the ED. She will be admitted to the stepdown unit for ongoing evaluation and management of sepsis suspected secondary to HCAP.   Review of Systems:  All other systems reviewed and apart from HPI, are negative.  Past Medical History:  Diagnosis Date  . Anemia   . Anxiety   . Anxiety disorder   . Arthritis    "pretty much all over" (01/05/2016)  . Asthma   . Atrial fibrillation ([place])   . 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 CHF (congestive heart failure) ([place])   . Chronic bronchitis ([place])   . Chronic lower back pain   . COPD (chronic obstructive pulmonary disease) ([place])   . Depression   .  ETOH abuse   . Heart murmur    "think I outgrew it" (01/05/2016)  . High cholesterol   . History of blood transfusion 12/2015   "while in ICU I think" (01/05/2016)  . HTN (hypertension)   . Hyperglycemia   . On home O2    "2.5-3L 24/7" (01/05/2016)  . Pneumonia     "several times"  . Psoriatic arthritis ([place])    "we give her injections of Humera twice/month unless she's ill; think the last one she had was 08/23/2015" (01/05/2016)    Past Surgical History:  Procedure Laterality Date  . JOINT REPLACEMENT    . TONSILLECTOMY AND ADENOIDECTOMY    . TOTAL HIP ARTHROPLASTY Right 05/15/2008  . TUBAL LIGATION       reports that she quit smoking about 7 years ago. She has a 35.00 pack-year smoking history. She has never used smokeless tobacco. She reports that she drinks about 8.4 oz of alcohol per week . She reports that she does not use drugs.  Allergies  Allergen Reactions  . Celexa [Citalopram Hydrobromide] Other (See Comments)    "Jerky movements"  . Nickel     Unknown reaction  . Tramadol Hcl Nausea And Vomiting    Family History  Problem Relation Age of Onset  . Heart disease Father   . Hyperlipidemia Father   . Hypertension Father   . Heart attack Father   . Cancer Paternal Grandfather   . Alzheimer's disease Mother   . Diabetes Sister   . Hyperlipidemia Sister   . Hypertension Sister   . Heart attack Sister   . Peripheral vascular disease Sister   . Heart disease Brother   . Hyperlipidemia Brother   . Heart attack Brother      Prior to Admission medications   Medication Sig Start Date End Date Taking? Authorizing Provider  albuterol (PROAIR HFA) 108 (90 BASE) MCG/ACT inhaler INHALE 2 PUFFS INTO THE LUNGS EVERY 6 HOURS AS NEEDED FOR WHEEZING OR SHORTNESS OF BREATH 03/07/15  Yes Mary-Margaret Hassell Done, FNP  albuterol (PROVENTIL) (2.5 MG/3ML) 0.083% nebulizer solution Take 2.5 mg by nebulization every morning.   Yes Historical Provider, MD  ALPRAZolam Duanne Moron) 0.5 MG tablet Take 1 tablet (0.5 mg total) by mouth 2 (two) times daily as needed for anxiety. 11/23/15  Yes Mary-Margaret Hassell Done, FNP  amitriptyline (ELAVIL) 50 MG tablet Take 1 tablet by mouth at  bedtime 12/23/14  Yes Mary-Margaret Hassell Done, FNP  amoxicillin (AMOXIL) 875 MG tablet  Take 1 tablet (875 mg total) by mouth 2 (two) times daily. 1 po BID 03/01/16  Yes Mary-Margaret Hassell Done, FNP  aspirin 325 MG tablet Take 325 mg by mouth every evening.    Yes Historical Provider, MD  buPROPion (WELLBUTRIN XL) 150 MG 24 hr tablet TAKE 1 TABLET BY MOUTH  DAILY 02/28/16  Yes Mary-Margaret Hassell Done, FNP  cetirizine (ALL DAY ALLERGY) 10 MG tablet Take 10 mg by mouth at bedtime.    Yes Historical Provider, MD  cholecalciferol (VITAMIN D) 1000 UNITS tablet Take 1,000 Units by mouth every morning.    Yes Historical Provider, MD  EQL CALCIUM CITRATE/VITAMIN D PO Take 1 each by mouth 2 (two) times daily. Vitamin D 500IU with Calcium Citrate 630mg   Chewables   Yes Historical Provider, MD  furosemide (LASIX) 20 MG tablet Take 1 tablet by mouth  daily Patient taking differently: Take 1 tablet by mouth  daily in the morning 12/11/15  Yes Mary-Margaret Hassell Done, FNP  guaifenesin (MUCUS RELIEF) 400 MG TABS tablet  Take 400 mg by mouth 2 (two) times daily.    Yes Historical Provider, MD  HYDROcodone-acetaminophen (NORCO) 10-325 MG tablet Take 1 tablet by mouth 2 (two) times daily. 03/21/16  Yes Wardell Honour, MD  HYDROcodone-homatropine [facility]) 5-1.5 MG/5ML syrup Take 5 mLs by mouth every 6 (six) hours as needed for cough. 03/01/16  Yes Mary-Margaret Hassell Done, FNP  metoprolol (LOPRESSOR) 50 MG tablet Take 1 tablet by mouth two  times daily 12/11/15  Yes Mary-Margaret Hassell Done, FNP  potassium chloride SA (K-DUR,KLOR-CON) 20 MEQ tablet Take 2 tablets (40 mEq total) by mouth daily. 01/05/16  Yes Domenic Polite, MD  tiotropium (SPIRIVA) 18 MCG inhalation capsule Place 1 capsule (18 mcg total) into inhaler and inhale every morning. 06/26/15  Yes Mary-Margaret Hassell Done, FNP  vitamin C (ASCORBIC ACID) 500 MG tablet Take 500 mg by mouth every morning.   Yes Historical Provider, MD  predniSONE (DELTASONE) 20 MG tablet Take 40mg  for 2days then 20mg  for 2days then STOP Patient not taking: Reported on 03/31/2016 01/06/16    Domenic Polite, MD    Physical Exam: Vitals:   03/31/16 2215 03/31/16 2230 03/31/16 2300 03/31/16 2330  BP: 98/60 107/75 100/65 102/57  Pulse: 96 98 96 116  Resp: 17 24 16 21   Temp:      TempSrc:      SpO2: 100% (!) 86% 99% 97%      Constitutional: mild tachypnea and accessory muscle recruitment, no pallor or diaphoresis, appears frail and older than reported age. Speaks in whisper.  Eyes: PERTLA, lids and conjunctivae normal ENMT: Mucous membranes are moist. Posterior pharynx clear of any exudate or lesions.   Neck: normal, supple, no masses, no thyromegaly Respiratory: Fine crackles throughout with coarse rhonchi at left mid-lung and occasional expiratory wheeze.    Cardiovascular: Rate ~110 and regular. No extremity edema. Neck veins flat. Abdomen: No distension, no tenderness, no masses palpated. Bowel sounds normal.  Musculoskeletal: no clubbing / cyanosis. Right hip tenderness with intact sensation and good cap refill distally.   Skin: Ulcers at bilateral LE's distally; lower left leg wound with exposed muscle, proteinaceous exudate, no surrounding erythema or foul odor. Dorsal right foot ulcer with exposed fascia, serous drainage, no surrounding erythema or foul odor. No pallor or cyanosis.  Neurologic: No gross facial asymmetry, PERRL, moving all extremities. Sensation intact in distal extremities x4.   Psychiatric: Alert and oriented x 3. Lethargic.     Labs on Admission: I have personally reviewed following labs and imaging studies  CBC:  Recent Labs Lab 03/31/16 1930  WBC 21.8*  NEUTROABS 17.7*  HGB 9.1*  HCT 31.8*  MCV 97.2  PLT [id number]*   Basic Metabolic Panel:  Recent Labs Lab 03/31/16 1930  NA 139  K 2.3*  CL 74*  CO2 44*  GLUCOSE 64*  BUN 7  CREATININE 0.99  CALCIUM 8.6*   GFR: Estimated Creatinine Clearance: 30.2 mL/min (by C-G formula based on SCr of 0.99 mg/dL). Liver Function Tests:  Recent Labs Lab 03/31/16 1930  AST 26  ALT 12*    ALKPHOS 101  BILITOT 1.1  PROT 7.0  ALBUMIN 2.0*    Recent Labs Lab 03/31/16 1930  LIPASE 15   No results for input(s): AMMONIA in the last 168 hours. Coagulation Profile: No results for input(s): INR, PROTIME in the last 168 hours. Cardiac Enzymes: No results for input(s): CKTOTAL, CKMB, CKMBINDEX, TROPONINI in the last 168 hours. BNP (last 3 results) No results for input(s): PROBNP in the last 8760  hours. HbA1C: No results for input(s): HGBA1C in the last 72 hours. CBG:  Recent Labs Lab 03/31/16 1943  GLUCAP 74   Lipid Profile: No results for input(s): CHOL, HDL, LDLCALC, TRIG, CHOLHDL, LDLDIRECT in the last 72 hours. Thyroid Function Tests: No results for input(s): TSH, T4TOTAL, FREET4, T3FREE, THYROIDAB in the last 72 hours. Anemia Panel: No results for input(s): VITAMINB12, FOLATE, FERRITIN, TIBC, IRON, RETICCTPCT in the last 72 hours. Urine analysis:    Component Value Date/Time   COLORURINE YELLOW 03/31/2016 1950   APPEARANCEUR HAZY (A) 03/31/2016 1950   LABSPEC 1.017 03/31/2016 1950   PHURINE 6.0 03/31/2016 1950   GLUCOSEU NEGATIVE 03/31/2016 1950   HGBUR NEGATIVE 03/31/2016 1950   BILIRUBINUR NEGATIVE 03/31/2016 1950   KETONESUR 20 (A) 03/31/2016 1950   PROTEINUR 30 (A) 03/31/2016 1950   UROBILINOGEN 0.2 07/29/2013 1915   NITRITE NEGATIVE 03/31/2016 1950   LEUKOCYTESUR TRACE (A) 03/31/2016 1950   Sepsis Labs: @LABRCNTIP (procalcitonin:4,lacticidven:4) )No results found for this or any previous visit (from the past 240 hour(s)).   Radiological Exams on Admission: Dg Chest 1 View  Result Date: 03/31/2016 CLINICAL DATA:  History of COPD, weakness right hip pain EXAM: CHEST 1 VIEW COMPARISON:  01/02/2016, 10/16/2015, 03/31/2014 FINDINGS: Lungs are hyperinflated. Coarse interstitial opacities compatible with fibrosis. Bilateral upper lobe pleural and parenchymal scarring again noted with slight increased left upper lobe opacity. Bibasilar pleural and  parenchymal scarring. Stable cardiomediastinal silhouette. No pneumothorax. IMPRESSION: 1. Hyperinflation with coarse, chronic interstitial changes within the apices and lung bases 2. Slight increased opacity in the apical portion of left upper lobe and suprahilar left lung, unable to exclude acute process/superimposed infiltrate in this region. Radiographic follow-up is recommended. Electronically Signed   By: Donavan Foil M.D.   On: 03/31/2016 20:34   Dg Hip Unilat With Pelvis 2-3 Views Right  Result Date: 03/31/2016 CLINICAL DATA:  Weakness with hip pain EXAM: DG HIP (WITH OR WITHOUT PELVIS) 2-3V RIGHT COMPARISON:  03/21/2016 FINDINGS: Probe overlies the lower pelvis. The SI joints are grossly symmetric. The left femoral head projects in joint. The pubic symphysis is intact. The patient is status post right hip replacement without acute dislocation. Again visualized is a fracture of the right greater trochanter of the femur, now with mild superior displacement of the the trochanteric fracture fragment by approximately 9 mm from the lower trochanter and shaft of the right femur. Dense vascular calcifications in the right thigh. IMPRESSION: 1. Status post right hip replacement without acute dislocation 2. Fracture through the greater trochanter of the right femur with new superior displacement of greater trochanter fracture fragment as described above. Electronically Signed   By: Donavan Foil M.D.   On: 03/31/2016 20:38    EKG: Independently reviewed. Sinus tachycardia (rate 116)  Assessment/Plan  1. Sepsis, suspected secondary to HCAP  - Meets sepsis criteria on admission with LUL infiltrate and primarily pulmonary sxs including newly productive cough and increased O2-requirement at rest - Blood and urine cultures obtained in ED; sputum culture and strep pneumo urine antigen testing requested on admission  - 30 cc/kg NS given in ED with clearance of lactate  - Prior micro data includes sputum  culture with pan-sensitive Pseudomonas and MRSA - Given the presentation with sepsis and history of Pseudomonas PNA, will treat with empiric vancomycin, cefepime, and Levaquin while awaiting culture data and following clinical response to therapy    2. Chronic diastolic CHF - Appears dry on admission in setting of sepsis and recent poor appetite  -  She was treated with 30 cc/kg bolus in ED, continued on IVF with NS in light of soft BP  - TTE (08/02/13) with EF [id number], grade 1 diastolic dysfunction  - Hold Lasix while hydrating, hold Lopressor until BP allows resumption - Follow daily wts and strict I/O's  3. COPD with exacerbation, acute on chronic hypoxic/hypercarbic respiratory failure  - Requires supplemental O2 at home, desaturating at rest in ED on her usual 3 Lpm  - Exacerbation likely precipitated by infection as above  - Start systemic steroids, continue prn nebs, abx as above  - Continue supplemental O2, titrate FiO2 to maintain sat low 90's   4. Hypokalemia  - Serum potassium 2.3 on admission, likely secondary to diuretic and poor nutrition  - She was given 30 mEq IV potassium, continue K-Dur 40 mEq increased to BID  - 2 g IV magnesium given  - Monitor on telemetry, repeat chem panel in am    5. Leg wounds - Non-healing ulcers noted to bilateral LE's  - No surrounding cellulitis evident on admission - Underlying osteomyelitis a concern given depth of wounds; consider imaging once more stable  - Wound care consultation requested   6. Anxiety, depression  - Stable on admission  - Continue Wellbutrin, Elavil  7. Normocytic anemia  - Hgb is 9.1 on admission and stable relative to priors  - Pt denies melena or hematochezia and FOBT is negative  - B12, folate, ferritin, and iron-saturation normal in August 2017   8. Right hip pain  - Pt has been evaluated for this by PCP and fracture through greater trochanter noted  - Per PCP notes, pt and family declined orhto consultation;  they confirm this in ED  - Continue symptomatic management    DVT prophylaxis: sq Lovenox  Code Status: Full Family Communication: Husband and daughter updated at bedside Disposition Plan: Admit to stepdown unit Consults called: None Admission status: Inpatient    Vianne Bulls, MD [facility] Pager [phone]  If 7PM-7AM, please contact night-coverage [email or web address] Password [facility]  03/31/2016, 11:58 PM

## 2016-03-31 NOTE — ED Notes (Signed)
EKG given to Dr Ray

## 2016-03-31 NOTE — ED Notes (Signed)
Patient transported to Xray via stretcher with transporter.

## 2016-03-31 NOTE — Progress Notes (Addendum)
Pharmacy Antibiotic Note  [name] is a 71 y.o. female admitted on 03/31/2016 with sepsis.  Pharmacy has been consulted for Vancomycin / Zosyn dosing.  Plan: Zosyn 3.375 grams iv Q 8 hours - 4 hour infusion  Vancomycin 500 mg iv Q 24 hours Follow up Scr, cultures, progress      Temp (24hrs), Avg:97.6 F (36.4 C), Min:97.6 F (36.4 C), Max:97.6 F (36.4 C)   Recent Labs Lab 03/31/16 1930 03/31/16 1956 03/31/16 2156  WBC 21.8*  --   --   CREATININE 0.99  --   --   LATICACIDVEN  --  5.71* 1.07    Estimated Creatinine Clearance: 30.2 mL/min (by C-G formula based on SCr of 0.99 mg/dL).    Allergies  Allergen Reactions  . Celexa [Citalopram Hydrobromide] Other (See Comments)    "Jerky movements"  . Nickel     Unknown reaction  . Tramadol Hcl Nausea And Vomiting    Thank you for allowing pharmacy to be a part of this patient's care. Anette Guarneri, PharmD [phone] 03/31/2016 10:11 PM    ADDENDUM: Now to change Zosyn to cefepime.  Will start cefepime 2g IV Q24H. Wynona Neat, PharmD, BCPS 04/01/2016 12:01 AM   ADDENDUM: Now to add Levaquin for double coverage of Pseudomonas given hx.  Will start Levaquin 500mg  IV Q48H. Wynona Neat, PharmD, BCPS 04/01/2016 2:21 AM

## 2016-03-31 NOTE — ED Notes (Signed)
I Stat Lactic Acid results shown to Dr. Mayer Camel

## 2016-03-31 NOTE — ED Notes (Signed)
Attempted report x 1; name and call back number provided 

## 2016-03-31 NOTE — ED Triage Notes (Signed)
Per family, hx of copd, c/o weakness, not eating well, lethargic. Pt opens eyes to verbal stimuli, pt coughing up yellow sputum. Pt whispering answers to questionsl. Denies pain.

## 2016-03-31 NOTE — ED Notes (Signed)
RN called pt placement to inform them of change in level of care

## 2016-03-31 NOTE — ED Provider Notes (Signed)
[facility] DEPT Provider Note  CSN: [id number] Arrival date & time: 03/31/16  1902  History   Chief Complaint Chief Complaint  Patient presents with  . Weakness   HPI [name] is a 71 y.o. female.  The history is provided by a relative and medical records. No language interpreter was used.  Illness  This is a new problem. The current episode started more than 2 days ago. The problem occurs constantly. The problem has been gradually worsening. Pertinent negatives include no chest pain, no abdominal pain and no headaches. Nothing aggravates the symptoms. Nothing relieves the symptoms.    Past Medical History:  Diagnosis Date  . Anemia   . Anxiety   . Anxiety disorder   . Arthritis    "pretty much all over" (01/05/2016)  . Asthma   . Atrial fibrillation ([place])   . Avascular necrosis of hip ([place])    left  . CHF (congestive heart failure) ([place])   . Chronic bronchitis ([place])   . Chronic lower back pain   . COPD (chronic obstructive pulmonary disease) ([place])   . Depression   . ETOH abuse   . Heart murmur    "think I outgrew it" (01/05/2016)  . High cholesterol   . History of blood transfusion 12/2015   "while in ICU I think" (01/05/2016)  . HTN (hypertension)   . Hyperglycemia   . On home O2    "2.5-3L 24/7" (01/05/2016)  . Pneumonia    "several times"  . Psoriatic arthritis ([place])    "we give her injections of Humera twice/month unless she's ill; think the last one she had was 08/23/2015" (01/05/2016)   Patient Active Problem List   Diagnosis Date Noted  . Sepsis ([place]) 03/31/2016  . Open leg wound 03/31/2016  . Sepsis due to pneumonia ([place]) 03/31/2016  . Pressure injury of skin 12/30/2015  . Seizure ([place])   . HCAP (healthcare-associated pneumonia) 12/19/2015  . Hypercarbia 12/19/2015  . Hypokalemia 12/19/2015  . Protein-calorie malnutrition, severe 10/18/2015  . Anxiety 10/15/2015  . Chronic diastolic CHF (congestive heart failure) ([place]) 10/15/2015  .  Normocytic anemia 10/15/2015  . Chronic respiratory failure with hypoxia and hypercapnia (H[facility])   . Atherosclerotic peripheral vascular disease with intermittent claudication ([place]) 06/09/2014  . FTT (failure to thrive) in adult 07/31/2013  . CHF (congestive heart failure) ([place]) 06/10/2013  . Osteopenia 06/16/2010  . Hyperlipidemia 06/16/2010  . HTN (hypertension) 06/16/2010  . Insomnia 06/16/2010  . RA (rheumatoid arthritis) ([place]) 06/16/2010  . RHEUMATIC FEVER, HX OF 04/20/2009  . COMPUTERIZED TOMOGRAPHY, CHEST, ABNORMAL 04/20/2009  . COPD (chronic obstructive pulmonary disease) ([place]) 04/18/2009   Past Surgical History:  Procedure Laterality Date  . JOINT REPLACEMENT    . TONSILLECTOMY AND ADENOIDECTOMY    . TOTAL HIP ARTHROPLASTY Right 05/15/2008  . TUBAL LIGATION     OB History    No data available      Home Medications    Prior to Admission medications   Medication Sig Start Date End Date Taking? Authorizing Provider  albuterol (PROAIR HFA) 108 (90 BASE) MCG/ACT inhaler INHALE 2 PUFFS INTO THE LUNGS EVERY 6 HOURS AS NEEDED FOR WHEEZING OR SHORTNESS OF BREATH 03/07/15  Yes Mary-Margaret Hassell Done, FNP  albuterol (PROVENTIL) (2.5 MG/3ML) 0.083% nebulizer solution Take 2.5 mg by nebulization every morning.   Yes Historical Provider, MD  ALPRAZolam Duanne Moron) 0.5 MG tablet Take 1 tablet (0.5 mg total) by mouth 2 (two) times daily as needed for anxiety. 11/23/15  Yes Mary-Margaret  Hassell Done, FNP  amitriptyline (ELAVIL) 50 MG tablet Take 1 tablet by mouth at  bedtime 12/23/14  Yes Mary-Margaret Hassell Done, FNP  amoxicillin (AMOXIL) 875 MG tablet Take 1 tablet (875 mg total) by mouth 2 (two) times daily. 1 po BID 03/01/16  Yes Mary-Margaret Hassell Done, FNP  aspirin 325 MG tablet Take 325 mg by mouth every evening.    Yes Historical Provider, MD  buPROPion (WELLBUTRIN XL) 150 MG 24 hr tablet TAKE 1 TABLET BY MOUTH  DAILY 02/28/16  Yes Mary-Margaret Hassell Done, FNP  cetirizine (ALL DAY ALLERGY) 10 MG tablet  Take 10 mg by mouth at bedtime.    Yes Historical Provider, MD  cholecalciferol (VITAMIN D) 1000 UNITS tablet Take 1,000 Units by mouth every morning.    Yes Historical Provider, MD  EQL CALCIUM CITRATE/VITAMIN D PO Take 1 each by mouth 2 (two) times daily. Vitamin D 500IU with Calcium Citrate 630mg   Chewables   Yes Historical Provider, MD  furosemide (LASIX) 20 MG tablet Take 1 tablet by mouth  daily Patient taking differently: Take 1 tablet by mouth  daily in the morning 12/11/15  Yes Mary-Margaret Hassell Done, FNP  guaifenesin (MUCUS RELIEF) 400 MG TABS tablet Take 400 mg by mouth 2 (two) times daily.    Yes Historical Provider, MD  HYDROcodone-acetaminophen (NORCO) 10-325 MG tablet Take 1 tablet by mouth 2 (two) times daily. 03/21/16  Yes Wardell Honour, MD  HYDROcodone-homatropine [facility]) 5-1.5 MG/5ML syrup Take 5 mLs by mouth every 6 (six) hours as needed for cough. 03/01/16  Yes Mary-Margaret Hassell Done, FNP  metoprolol (LOPRESSOR) 50 MG tablet Take 1 tablet by mouth two  times daily 12/11/15  Yes Mary-Margaret Hassell Done, FNP  potassium chloride SA (K-DUR,KLOR-CON) 20 MEQ tablet Take 2 tablets (40 mEq total) by mouth daily. 01/05/16  Yes Domenic Polite, MD  tiotropium (SPIRIVA) 18 MCG inhalation capsule Place 1 capsule (18 mcg total) into inhaler and inhale every morning. 06/26/15  Yes Mary-Margaret Hassell Done, FNP  vitamin C (ASCORBIC ACID) 500 MG tablet Take 500 mg by mouth every morning.   Yes Historical Provider, MD  predniSONE (DELTASONE) 20 MG tablet Take 40mg  for 2days then 20mg  for 2days then STOP Patient not taking: Reported on 03/31/2016 01/06/16   Domenic Polite, MD   Family History Family History  Problem Relation Age of Onset  . Heart disease Father   . Hyperlipidemia Father   . Hypertension Father   . Heart attack Father   . Cancer Paternal Grandfather   . Alzheimer's disease Mother   . Diabetes Sister   . Hyperlipidemia Sister   . Hypertension Sister   . Heart attack Sister   .  Peripheral vascular disease Sister   . Heart disease Brother   . Hyperlipidemia Brother   . Heart attack Brother    Social History Social History  Substance Use Topics  . Smoking status: Former Smoker    Packs/day: 1.00    Years: 35.00    Quit date: 02/15/2009  . Smokeless tobacco: Never Used  . Alcohol use 8.4 oz/week    14 Shots of liquor per week     Comment: hx of abuse and withdrawal; 01/05/2016 "couple shots/night"    Allergies   Celexa [citalopram hydrobromide]; Nickel; and Tramadol hcl  Review of Systems Review of Systems  Unable to perform ROS: Acuity of condition  Constitutional: Positive for appetite change (decreased]), chills, fatigue and fever.  Respiratory: Positive for cough.   Cardiovascular: Negative for chest pain.  Gastrointestinal: Positive for nausea. Negative  for abdominal pain.  Neurological: Negative for headaches.    Physical Exam Updated Vital Signs BP (!) 94/53   Pulse 99   Temp 97.6 F (36.4 C) (Oral)   Resp 18   SpO2 100%   Physical Exam  Constitutional: No distress.  Cachectic appearing elderly Caucasian female  HENT:  Head: Normocephalic and atraumatic.  Eyes: Pupils are equal, round, and reactive to light.  Neck: Normal range of motion. Neck supple.  Cardiovascular: Regular rhythm and normal heart sounds.   HR 90's to low 100's, borderline hypotensive  Pulmonary/Chest: Effort normal. No respiratory distress.  Coarse breath sounds at bases bilaterally, maintaining sats at high 90s on room air  Abdominal: Soft. Bowel sounds are normal. She exhibits no distension.  Musculoskeletal: Normal range of motion. She exhibits no deformity.  Neurological: She is alert. She displays normal reflexes.  Skin: Skin is warm and dry. Capillary refill takes 2 to 3 seconds. She is not diaphoretic.  Ulcerations to bilateral lower extremities extending to muscle without obvious surrounding erythema or induration  Nursing note and vitals  reviewed.   ED Treatments / Results  Labs (all labs ordered are listed, but only abnormal results are displayed) Labs Reviewed  COMPREHENSIVE METABOLIC PANEL - Abnormal; Notable for the following:       Result Value   Potassium 2.3 (*)    Chloride 74 (*)    CO2 44 (*)    Glucose, Bld 64 (*)    Calcium 8.6 (*)    Albumin 2.0 (*)    ALT 12 (*)    GFR calc non Af Amer 56 (*)    Anion gap 21 (*)    All other components within normal limits  CBC WITH DIFFERENTIAL/PLATELET - Abnormal; Notable for the following:    WBC 21.8 (*)    RBC 3.27 (*)    Hemoglobin 9.1 (*)    HCT 31.8 (*)    MCHC 28.6 (*)    RDW 16.4 (*)    Platelets 463 (*)    Neutro Abs 17.7 (*)    Monocytes Absolute 1.7 (*)    All other components within normal limits  URINALYSIS, ROUTINE W REFLEX MICROSCOPIC - Abnormal; Notable for the following:    APPearance HAZY (*)    Ketones, ur 20 (*)    Protein, ur 30 (*)    Leukocytes, UA TRACE (*)    Bacteria, UA RARE (*)    Squamous Epithelial / LPF 0-5 (*)    All other components within normal limits  I-STAT CG4 LACTIC ACID, ED - Abnormal; Notable for the following:    Lactic Acid, Venous 5.71 (*)    All other components within normal limits  CULTURE, BLOOD (ROUTINE X 2)  CULTURE, BLOOD (ROUTINE X 2)  URINE CULTURE  CULTURE, EXPECTORATED SPUTUM-ASSESSMENT  LIPASE, BLOOD  BRAIN NATRIURETIC PEPTIDE  INFLUENZA PANEL BY PCR (TYPE A & B, H1N1)  LACTIC ACID, PLASMA  PROCALCITONIN  PROTIME-INR  APTT  COMPREHENSIVE METABOLIC PANEL  CBC  I-STAT TROPOININ, ED  I-STAT VENOUS BLOOD GAS, ED  I-STAT CG4 LACTIC ACID, ED  I-STAT TROPOININ, ED  CBG MONITORING, ED  POC OCCULT BLOOD, ED  I-STAT CG4 LACTIC ACID, ED   EKG  EKG Interpretation None      Radiology Dg Chest 1 View  Result Date: 03/31/2016 CLINICAL DATA:  History of COPD, weakness right hip pain EXAM: CHEST 1 VIEW COMPARISON:  01/02/2016, 10/16/2015, 03/31/2014 FINDINGS: Lungs are hyperinflated. Coarse  interstitial opacities compatible with fibrosis.  Bilateral upper lobe pleural and parenchymal scarring again noted with slight increased left upper lobe opacity. Bibasilar pleural and parenchymal scarring. Stable cardiomediastinal silhouette. No pneumothorax. IMPRESSION: 1. Hyperinflation with coarse, chronic interstitial changes within the apices and lung bases 2. Slight increased opacity in the apical portion of left upper lobe and suprahilar left lung, unable to exclude acute process/superimposed infiltrate in this region. Radiographic follow-up is recommended. Electronically Signed   By: Donavan Foil M.D.   On: 03/31/2016 20:34   Dg Hip Unilat With Pelvis 2-3 Views Right  Result Date: 03/31/2016 CLINICAL DATA:  Weakness with hip pain EXAM: DG HIP (WITH OR WITHOUT PELVIS) 2-3V RIGHT COMPARISON:  03/21/2016 FINDINGS: Probe overlies the lower pelvis. The SI joints are grossly symmetric. The left femoral head projects in joint. The pubic symphysis is intact. The patient is status post right hip replacement without acute dislocation. Again visualized is a fracture of the right greater trochanter of the femur, now with mild superior displacement of the the trochanteric fracture fragment by approximately 9 mm from the lower trochanter and shaft of the right femur. Dense vascular calcifications in the right thigh. IMPRESSION: 1. Status post right hip replacement without acute dislocation 2. Fracture through the greater trochanter of the right femur with new superior displacement of greater trochanter fracture fragment as described above. Electronically Signed   By: Donavan Foil M.D.   On: 03/31/2016 20:38   Procedures Procedures (including critical care time)  Medications Ordered in ED Medications  vancomycin (VANCOCIN) 500 mg in sodium chloride 0.9 % 100 mL IVPB (not administered)  HYDROcodone-acetaminophen (NORCO) 10-325 MG per tablet 1 tablet (not administered)  buPROPion (WELLBUTRIN XL) 24 hr tablet  150 mg (not administered)  potassium chloride SA (K-DUR,KLOR-CON) CR tablet 40 mEq (not administered)  ALPRAZolam (XANAX) tablet 0.5 mg (not administered)  amitriptyline (ELAVIL) tablet 50 mg (50 mg Oral Not Given 04/01/16 0020)  cholecalciferol (VITAMIN D) tablet 1,000 Units (not administered)  guaifenesin (HUMIBID E) tablet 400 mg (not administered)  aspirin tablet 325 mg (not administered)  enoxaparin (LOVENOX) injection 40 mg (not administered)  sodium chloride flush (NS) 0.9 % injection 3 mL (not administered)  sodium chloride flush (NS) 0.9 % injection 3 mL (not administered)  sodium chloride flush (NS) 0.9 % injection 3 mL (not administered)  0.9 %  sodium chloride infusion (not administered)  acetaminophen (TYLENOL) tablet 650 mg (not administered)    Or  acetaminophen (TYLENOL) suppository 650 mg (not administered)  polyethylene glycol (MIRALAX / GLYCOLAX) packet 17 g (not administered)  bisacodyl (DULCOLAX) suppository 10 mg (not administered)  ondansetron (ZOFRAN) tablet 4 mg (not administered)    Or  ondansetron (ZOFRAN) injection 4 mg (not administered)  potassium chloride 30 mEq in sodium chloride 0.9 % 265 mL (KCL MULTIRUN) IVPB (30 mEq Intravenous Given 04/01/16 0041)  ipratropium-albuterol (DUONEB) 0.5-2.5 (3) MG/3ML nebulizer solution 3 mL (not administered)  ceFEPIme (MAXIPIME) 2 g in dextrose 5 % 50 mL IVPB (not administered)  sodium chloride 0.9 % bolus 1,000 mL (0 mLs Intravenous Stopped 03/31/16 2059)    And  sodium chloride 0.9 % bolus 250 mL (0 mLs Intravenous Stopped 03/31/16 2206)  piperacillin-tazobactam (ZOSYN) IVPB 3.375 g (0 g Intravenous Stopped 03/31/16 2059)  vancomycin (VANCOCIN) IVPB 1000 mg/200 mL premix (0 mg Intravenous Stopped 03/31/16 2059)  sodium chloride 0.9 % bolus 1,000 mL (0 mLs Intravenous Stopped 03/31/16 2059)    And  sodium chloride 0.9 % bolus 250 mL (0 mLs Intravenous Stopped 03/31/16 2206)  sodium chloride 0.9 % bolus 1,000 mL (0 mLs  Intravenous Stopped 03/31/16 2206)  ceFEPIme (MAXIPIME) 2 g in dextrose 5 % 50 mL IVPB (2 g Intravenous New Bag/Given 04/01/16 0041)     Initial Impression / Assessment and Plan / ED Course  I have reviewed the triage vital signs and the nursing notes.  Clinical Course   71 y.o. female with above stated PMHx, HPI, and physical. History as above.  Concern for sepsis. Blood and urine cultures obtained. Given IVF's & started on broad spectrum antibiotics with Vancomycin/Zosyn. CXR ? of PNA. UA with questionable UTI.  WBC 22. LA 5 with improvement to 1 foloowin IVF's. Laboratory and imaging results were personally reviewed by myself and used in the medical decision making of this patient's treatment and disposition.  Pt admitted to medicine for further evaluation and management of sepsis. Pt understands and agrees with the plan and has no further questions or concerns.   Pt care discussed with and followed by my attending, Dr. Clarene Critchley, MD Pager [phone]  Final Clinical Impressions(s) / ED Diagnoses   Final diagnoses:  Right hip pain  Sepsis  Leukocytosis   New Prescriptions New Prescriptions   No medications on file     Mayer Camel, MD 04/01/16 [id number]    Pattricia Boss, MD 04/04/16 1303

## 2016-04-01 ENCOUNTER — Ambulatory Visit (HOSPITAL_COMMUNITY): Payer: Medicare Other | Admitting: Physical Therapy

## 2016-04-01 DIAGNOSIS — J441 Chronic obstructive pulmonary disease with (acute) exacerbation: Secondary | ICD-10-CM | POA: Insufficient documentation

## 2016-04-01 DIAGNOSIS — M25551 Pain in right hip: Secondary | ICD-10-CM | POA: Insufficient documentation

## 2016-04-01 LAB — PROTIME-INR
INR: 1.26
PROTHROMBIN TIME: 15.9 s — AB (ref 11.4–15.2)

## 2016-04-01 LAB — BASIC METABOLIC PANEL
Anion gap: 6 (ref 5–15)
BUN: 6 mg/dL (ref 6–20)
CALCIUM: 7.3 mg/dL — AB (ref 8.9–10.3)
CHLORIDE: 100 mmol/L — AB (ref 101–111)
CO2: 38 mmol/L — ABNORMAL HIGH (ref 22–32)
Creatinine, Ser: 0.73 mg/dL (ref 0.44–1.00)
GFR calc non Af Amer: >60 mL/min (ref >60)
Glucose, Bld: 195 mg/dL — ABNORMAL HIGH (ref 65–99)
Potassium: 3.6 mmol/L (ref 3.5–5.1)
SODIUM: 144 mmol/L (ref 135–145)

## 2016-04-01 LAB — CBC
HEMATOCRIT: 26.6 % — AB (ref 36.0–46.0)
HEMOGLOBIN: 7.6 g/dL — AB (ref 12.0–15.0)
MCH: 28 pg (ref 26.0–34.0)
MCHC: 28.6 g/dL — AB (ref 30.0–36.0)
MCV: 98.2 fL (ref 78.0–100.0)
Platelets: 236 10*3/uL (ref 150–400)
RBC: 2.71 MIL/uL — ABNORMAL LOW (ref 3.87–5.11)
RDW: 16.2 % — AB (ref 11.5–15.5)
WBC: 12.3 10*3/uL — ABNORMAL HIGH (ref 4.0–10.5)

## 2016-04-01 LAB — COMPREHENSIVE METABOLIC PANEL
ALBUMIN: 1.5 g/dL — AB (ref 3.5–5.0)
ALK PHOS: 69 U/L (ref 38–126)
ALT: 10 U/L — ABNORMAL LOW (ref 14–54)
ANION GAP: 9 (ref 5–15)
AST: 13 U/L — AB (ref 15–41)
BILIRUBIN TOTAL: 0.9 mg/dL (ref 0.3–1.2)
BUN: 6 mg/dL (ref 6–20)
CALCIUM: 6.8 mg/dL — AB (ref 8.9–10.3)
CO2: 38 mmol/L — AB (ref 22–32)
Chloride: 93 mmol/L — ABNORMAL LOW (ref 101–111)
Creatinine, Ser: 0.75 mg/dL (ref 0.44–1.00)
GFR calc Af Amer: >60 mL/min (ref >60)
GFR calc non Af Amer: >60 mL/min (ref >60)
GLUCOSE: 138 mg/dL — AB (ref 65–99)
Potassium: 2.5 mmol/L — CL (ref 3.5–5.1)
SODIUM: 140 mmol/L (ref 135–145)
TOTAL PROTEIN: 5.3 g/dL — AB (ref 6.5–8.1)

## 2016-04-01 LAB — BRAIN NATRIURETIC PEPTIDE: B NATRIURETIC PEPTIDE 5: 381.1 pg/mL — AB (ref 0.0–100.0)

## 2016-04-01 LAB — INFLUENZA PANEL BY PCR (TYPE A & B)
INFLAPCR: NEGATIVE
Influenza B By PCR: NEGATIVE

## 2016-04-01 LAB — EXPECTORATED SPUTUM ASSESSMENT W GRAM STAIN, RFLX TO RESP C

## 2016-04-01 LAB — GLUCOSE, CAPILLARY
GLUCOSE-CAPILLARY: 159 mg/dL — AB (ref 65–99)
GLUCOSE-CAPILLARY: 154 mg/dL — AB (ref 65–99)
GLUCOSE-CAPILLARY: 188 mg/dL — AB (ref 65–99)
GLUCOSE-CAPILLARY: 200 mg/dL — AB (ref 65–99)
Glucose-Capillary: 145 mg/dL — ABNORMAL HIGH (ref 65–99)
Glucose-Capillary: 207 mg/dL — ABNORMAL HIGH (ref 65–99)

## 2016-04-01 LAB — MRSA PCR SCREENING: MRSA by PCR: POSITIVE — AB

## 2016-04-01 LAB — MAGNESIUM: Magnesium: 1.8 mg/dL (ref 1.7–2.4)

## 2016-04-01 LAB — PROCALCITONIN: PROCALCITONIN: 0.3 ng/mL

## 2016-04-01 LAB — APTT: aPTT: 32 seconds (ref 24–36)

## 2016-04-01 LAB — LACTIC ACID, PLASMA: LACTIC ACID, VENOUS: 0.7 mmol/L (ref 0.5–1.9)

## 2016-04-01 MED ORDER — POTASSIUM CHLORIDE 20 MEQ/15ML (10%) PO SOLN
40.0000 meq | Freq: Two times a day (BID) | ORAL | Status: DC
  Administered 2016-04-01: 40 meq via ORAL
  Filled 2016-04-01: qty 30

## 2016-04-01 MED ORDER — POTASSIUM CHLORIDE IN NACL 20-0.9 MEQ/L-% IV SOLN
INTRAVENOUS | Status: AC
  Administered 2016-04-01 – 2016-04-02 (×2): via INTRAVENOUS
  Filled 2016-04-01 (×2): qty 1000

## 2016-04-01 MED ORDER — CHLORHEXIDINE GLUCONATE CLOTH 2 % EX PADS
6.0000 | MEDICATED_PAD | Freq: Every day | CUTANEOUS | Status: AC
  Administered 2016-04-01 – 2016-04-05 (×5): 6 via TOPICAL

## 2016-04-01 MED ORDER — INSULIN ASPART 100 UNIT/ML ~~LOC~~ SOLN
0.0000 [IU] | Freq: Three times a day (TID) | SUBCUTANEOUS | Status: DC
  Administered 2016-04-01: 2 [IU] via SUBCUTANEOUS

## 2016-04-01 MED ORDER — POTASSIUM CHLORIDE CRYS ER 20 MEQ PO TBCR: 40.0000 meq | EXTENDED_RELEASE_TABLET | Freq: Two times a day (BID) | ORAL | Status: DC

## 2016-04-01 MED ORDER — SODIUM CHLORIDE 0.9 % IV SOLN
30.0000 meq | Freq: Once | INTRAVENOUS | Status: AC
  Administered 2016-04-01: 30 meq via INTRAVENOUS
  Filled 2016-04-01: qty 15

## 2016-04-01 MED ORDER — MUPIROCIN CALCIUM 2 % EX CREA
TOPICAL_CREAM | Freq: Every day | CUTANEOUS | Status: DC
  Administered 2016-04-01 – 2016-04-03 (×3): via TOPICAL
  Administered 2016-04-04 – 2016-04-06 (×2): 1 via TOPICAL
  Administered 2016-04-07 – 2016-04-08 (×2): via TOPICAL
  Administered 2016-04-09 – 2016-04-10 (×2): 1 via TOPICAL
  Administered 2016-04-11 – 2016-04-12 (×2): via TOPICAL
  Filled 2016-04-01 (×2): qty 15

## 2016-04-01 MED ORDER — SODIUM CHLORIDE 0.9 % IV SOLN
INTRAVENOUS | Status: AC
  Administered 2016-04-01: 02:00:00 via INTRAVENOUS

## 2016-04-01 MED ORDER — DEXTROSE 5 % IV SOLN
2.0000 g | INTRAVENOUS | Status: DC
  Administered 2016-04-02 (×2): 2 g via INTRAVENOUS
  Filled 2016-04-01 (×2): qty 2

## 2016-04-01 MED ORDER — METHYLPREDNISOLONE SODIUM SUCC 125 MG IJ SOLR
125.0000 mg | Freq: Once | INTRAMUSCULAR | Status: AC
  Administered 2016-04-01: 125 mg via INTRAVENOUS
  Filled 2016-04-01: qty 2

## 2016-04-01 MED ORDER — LEVOFLOXACIN IN D5W 500 MG/100ML IV SOLN
500.0000 mg | INTRAVENOUS | Status: DC
  Administered 2016-04-01 – 2016-04-03 (×2): 500 mg via INTRAVENOUS
  Filled 2016-04-01 (×2): qty 100

## 2016-04-01 MED ORDER — MUPIROCIN 2 % EX OINT
1.0000 "application " | TOPICAL_OINTMENT | Freq: Two times a day (BID) | CUTANEOUS | Status: AC
  Administered 2016-04-01 – 2016-04-05 (×10): 1 via NASAL
  Filled 2016-04-01: qty 22

## 2016-04-01 MED ORDER — METHYLPREDNISOLONE SODIUM SUCC 125 MG IJ SOLR
60.0000 mg | Freq: Four times a day (QID) | INTRAMUSCULAR | Status: DC
  Administered 2016-04-01 – 2016-04-05 (×17): 60 mg via INTRAVENOUS
  Filled 2016-04-01 (×19): qty 2

## 2016-04-01 MED ORDER — MAGNESIUM SULFATE 2 GM/50ML IV SOLN
2.0000 g | Freq: Once | INTRAVENOUS | Status: AC
  Administered 2016-04-01: 2 g via INTRAVENOUS
  Filled 2016-04-01: qty 50

## 2016-04-01 NOTE — Progress Notes (Signed)
Sputum obtained/labelled/sent to lab.

## 2016-04-01 NOTE — ED Notes (Signed)
Attempted report x 1; name and call back number provided 

## 2016-04-01 NOTE — Consult Note (Addendum)
[facility] Nurse wound consult note Reason for Consult: Consult requested for bilat legs and feet.  Family members at the bedside state these have been present over 6 months, and they have been using antibacterial ointment at home; pt has been told she has poor circulation. Pt has been swabbed for possible MRSA. Wound type: Chronic full thickness stasis wounds Pressure Injury POA: These are full thickness wounds, NOT pressure injuries. Measurement: Left posterior leg 4X2X.2cm, 90% red, 10% yellow, no odor or drainage. Right anterior foot 80% dark red, 20% yellow, dry woundbed, no odor or drainage, 1.2X1.2X.2cm. Right outer heel with dark purple bruise.  Right heel with previous full thickness wound which family members state has healed at this time. Right anterior foot 4.5X2.5X.2cm, 50% red, 50% yellow, dry woundbed, no odor or drainage. Dressing procedure/placement/frequency: Bactroban to promote moist healing, foam dressing to protect from further injury.  Float heels to reduce pressure and foam dressing to protect from further injury.  Discussed plan of care with family members at the bedside and they verbalized understanding. Please re-consult if further assistance is needed.  Thank-you,  Julien Girt MSN, [place], [place], [place], [place]

## 2016-04-01 NOTE — Progress Notes (Signed)
CRITICAL VALUE ALERT  Critical value received:  MRSA PCR+   Date of notification:  04/01/16  Time of notification:  0827  Critical value read back:Yes.    Nurse who received alert:  Dwaine Gale, RN   MD notified (1st page):  Dr. Curly Rim - orders for MRSA+ standing orders placed per protocol/cosign required.   Time of first page:  0830

## 2016-04-01 NOTE — ED Notes (Signed)
Hug[place], MD responded to RNs page; will continue to monitor patient

## 2016-04-01 NOTE — Progress Notes (Signed)
[facility] PROGRESS NOTE  [name] QVZ:[id number] DOB: 1945/05/30 DOA: 03/31/2016  PCP: Chevis Pretty, FNP  Brief History/Interval Summary: 71 y.o. female with medical history significant for COPD with chronic respiratory failure, hypertension, depression, anxiety, chronic diastolic CHF, and severe protein calorie malnutrition who presented to the emergency department for evaluation of productive cough, generalized weakness, loss of appetite, and lethargy. She was found to have sepsis secondary to healthcare associated pneumonia.  Reason for Visit: Sepsis secondary to healthcare associated pneumonia.  Consultants: None  Procedures: None  Antibiotics: Vancomycin, cefepime and Levaquin  Subjective/Interval History: Patient not very responsive this morning. She was given Xanax earlier this morning. She is arousable. Her husband and daughter are at the bedside.  ROS: Unable to do at this time  Objective:  Vital Signs  Vitals:   04/01/16 0645 04/01/16 0800 04/01/16 0818 04/01/16 1149  BP: (!) 100/52  117/77 (!) 104/54  Pulse: 95  (!) 117 87  Resp: '18  20 17  '$ Temp: 97.1 F (36.2 C)   97.5 F (36.4 C)  TempSrc: Oral   Oral  SpO2: 100% 100% 100% 98%  Weight: 43 kg (94 lb 12.8 oz)     Height: 5' (1.524 m)       Intake/Output Summary (Last 24 hours) at 04/01/16 1241 Last data filed at 04/01/16 1215  Gross per 24 hour  Intake             1940 ml  Output              250 ml  Net             1690 ml   Filed Weights   04/01/16 0248 04/01/16 0645  Weight: 43.2 kg (95 lb 3.8 oz) 43 kg (94 lb 12.8 oz)    General appearance: Somnolent. In no distress. Resp: Crackles bilateral bases. No wheezing, no rhonchi. Cardio: regular rate and rhythm, S1, S2 normal, no murmur, click, rub or gallop GI: soft, non-tender; bowel sounds normal; no masses,  no organomegaly Extremities: extremities normal, atraumatic, no cyanosis or edema Neurologic: Somnolent. Arousable.  No obvious focal neurological deficits noted.  Lab Results:  Data Reviewed: I have personally reviewed following labs and imaging studies  CBC:  Recent Labs Lab 03/31/16 1930 04/01/16 0239  WBC 21.8* 12.3*  NEUTROABS 17.7*  --   HGB 9.1* 7.6*  HCT 31.8* 26.6*  MCV 97.2 98.2  PLT 463* 329    Basic Metabolic Panel:  Recent Labs Lab 03/31/16 1930 04/01/16 0239 04/01/16 0955  NA 139 140  --   K 2.3* 2.5*  --   CL 74* 93*  --   CO2 44* 38*  --   GLUCOSE 64* 138*  --   BUN 7 6  --   CREATININE 0.99 0.75  --   CALCIUM 8.6* 6.8*  --   MG  --   --  1.8    GFR: Estimated Creatinine Clearance: 43.8 mL/min (by C-G formula based on SCr of 0.75 mg/dL).  Liver Function Tests:  Recent Labs Lab 03/31/16 1930 04/01/16 0239  AST 26 13*  ALT 12* 10*  ALKPHOS 101 69  BILITOT 1.1 0.9  PROT 7.0 5.3*  ALBUMIN 2.0* 1.5*     Recent Labs Lab 03/31/16 1930  LIPASE 15    Coagulation Profile:  Recent Labs Lab 04/01/16 0725  INR 1.26    CBG:  Recent Labs Lab 03/31/16 1943 04/01/16 0654 04/01/16 0816 04/01/16 1150  GLUCAP 74  145* 159* 154*     Recent Results (from the past 240 hour(s))  Blood Culture (routine x 2)     Status: None (Preliminary result)   Collection Time: 03/31/16  7:30 PM  Result Value Ref Range Status   Specimen Description BLOOD LEFT HAND  Final   Special Requests BOTTLES DRAWN AEROBIC AND ANAEROBIC 5CC  Final   Culture NO GROWTH < 24 HOURS  Final   Report Status PENDING  Incomplete  Blood Culture (routine x 2)     Status: None (Preliminary result)   Collection Time: 03/31/16  7:30 PM  Result Value Ref Range Status   Specimen Description LEFT ANTECUBITAL  Final   Special Requests BOTTLES DRAWN AEROBIC AND ANAEROBIC 5CC  Final   Culture NO GROWTH < 24 HOURS  Final   Report Status PENDING  Incomplete  Culture, sputum-assessment     Status: None   Collection Time: 04/01/16  5:00 AM  Result Value Ref Range Status   Specimen Description  SPUTUM  Final   Special Requests Immunocompromised  Final   Sputum evaluation THIS SPECIMEN IS ACCEPTABLE FOR SPUTUM CULTURE  Final   Report Status 04/01/2016 FINAL  Final  MRSA PCR Screening     Status: Abnormal   Collection Time: 04/01/16  6:30 AM  Result Value Ref Range Status   MRSA by PCR POSITIVE (A) NEGATIVE Final    Comment:        The GeneXpert MRSA Assay (FDA approved for NASAL specimens only), is one component of a comprehensive MRSA colonization surveillance program. It is not intended to diagnose MRSA infection nor to guide or monitor treatment for MRSA infections. RESULT CALLED TO, READ BACK BY AND VERIFIED WITH: C ECKELMANN,RN AT 9798 04/01/16 BY L BENFIELD       Radiology Studies: Dg Chest 1 View  Result Date: 03/31/2016 CLINICAL DATA:  History of COPD, weakness right hip pain EXAM: CHEST 1 VIEW COMPARISON:  01/02/2016, 10/16/2015, 03/31/2014 FINDINGS: Lungs are hyperinflated. Coarse interstitial opacities compatible with fibrosis. Bilateral upper lobe pleural and parenchymal scarring again noted with slight increased left upper lobe opacity. Bibasilar pleural and parenchymal scarring. Stable cardiomediastinal silhouette. No pneumothorax. IMPRESSION: 1. Hyperinflation with coarse, chronic interstitial changes within the apices and lung bases 2. Slight increased opacity in the apical portion of left upper lobe and suprahilar left lung, unable to exclude acute process/superimposed infiltrate in this region. Radiographic follow-up is recommended. Electronically Signed   By: Donavan Foil M.D.   On: 03/31/2016 20:34   Dg Hip Unilat With Pelvis 2-3 Views Right  Result Date: 03/31/2016 CLINICAL DATA:  Weakness with hip pain EXAM: DG HIP (WITH OR WITHOUT PELVIS) 2-3V RIGHT COMPARISON:  03/21/2016 FINDINGS: Probe overlies the lower pelvis. The SI joints are grossly symmetric. The left femoral head projects in joint. The pubic symphysis is intact. The patient is status post right  hip replacement without acute dislocation. Again visualized is a fracture of the right greater trochanter of the femur, now with mild superior displacement of the the trochanteric fracture fragment by approximately 9 mm from the lower trochanter and shaft of the right femur. Dense vascular calcifications in the right thigh. IMPRESSION: 1. Status post right hip replacement without acute dislocation 2. Fracture through the greater trochanter of the right femur with new superior displacement of greater trochanter fracture fragment as described above. Electronically Signed   By: Donavan Foil M.D.   On: 03/31/2016 20:38     Medications:  Scheduled: . amitriptyline  50 mg Oral QHS  . aspirin  325 mg Oral QPM  . buPROPion  150 mg Oral Daily  . [START ON 04/02/2016] ceFEPime (MAXIPIME) IV  2 g Intravenous Q24H  . Chlorhexidine Gluconate Cloth  6 each Topical Q0600  . cholecalciferol  1,000 Units Oral q morning - 10a  . enoxaparin (LOVENOX) injection  30 mg Subcutaneous Q24H  . guaiFENesin  400 mg Oral BID  . levofloxacin (LEVAQUIN) IV  500 mg Intravenous Q48H  . methylPREDNISolone (SOLU-MEDROL) injection  60 mg Intravenous Q6H  . mupirocin cream   Topical Daily  . mupirocin ointment  1 application Nasal BID  . potassium chloride (KCL MULTIRUN) 30 mEq in 265 mL IVPB  30 mEq Intravenous Once  . sodium chloride flush  3 mL Intravenous Q12H  . sodium chloride flush  3 mL Intravenous Q12H  . vancomycin  500 mg Intravenous Q24H   Continuous: . 0.9 % NaCl with KCl 20 mEq / L 75 mL/hr at 04/01/16 1042   UKG:URKYHC chloride, acetaminophen **OR** acetaminophen, ALPRAZolam, bisacodyl, HYDROcodone-acetaminophen, ipratropium-albuterol, ondansetron **OR** ondansetron (ZOFRAN) IV, polyethylene glycol, sodium chloride flush  Assessment/Plan:  Principal Problem:   Sepsis ([place]) Active Problems:   COPD (chronic obstructive pulmonary disease) (HCC)   HTN (hypertension)   RA (rheumatoid arthritis) (HCC)   FTT  (failure to thrive) in adult   Anxiety   Chronic diastolic CHF (congestive heart failure) (HCC)   Normocytic anemia   Chronic respiratory failure with hypoxia and hypercapnia ([place])   HCAP (healthcare-associated pneumonia)   Hypokalemia   Open leg wound   Sepsis due to pneumonia ([place])    Sepsis, suspected secondary to HCAP  Patient met sepsis criteria on admission with LUL infiltrate and primarily pulmonary sxs including newly productive cough and increased O2-requirement at rest. Patient started on broad-spectrum antibiotics based on her previous microbiology data. She has been found to have pseudomonas and MRSA previously. Currently on vancomycin, cefepime and Levaquin. Narrow antibiotics based on results of culture. Swallow evaluation.  Chronic diastolic CHF Patient appeared to be dry at the time of admission in the setting of sepsis and recent poor appetite. She was given IV fluids in the emergency department. Currently holding her Lasix. Holding fluids as well. Continue to monitor. Ins and outs and daily weights.  COPD with exacerbation, acute on chronic hypoxic/hypercarbic respiratory failure  Requires supplemental O2 at home. Patient was noted to be hypoxic in the emergency department. Currently stable. Continue systemic steroids.  Hypokalemia  Serum potassium 2.3 on admission, likely secondary to diuretic and poor nutrition. Patient was given potassium. Potassium level has only slightly improved. Magnesium is 1.8. Recheck potassium level later this afternoon.  Leg wounds Non-healing ulcers noted to bilateral LE's. Wound care has been consulted. Recent x-rays of her feet did not show any acute findings.  Anxiety, depression  Stable. Continue Wellbutrin, Elavil. Watch for sedation.  Normocytic anemia  Hemoglobin is stable.  Right hip pain  Pt has been evaluated for this by PCP and fracture through greater trochanter noted. Per PCP notes, pt and family declined orhto  consultation; they confirmed this in ED. Continue symptomatic management.  DVT Prophylaxis: Lovenox    Code Status: Full code. Confirmed with patient's family  Family Communication: Discussed with patient's daughter and husband  Disposition Plan: Management as outlined above. PT evaluation once patient is more stable. Swallow evaluation.    LOS: 1 day   [facility] Pager [phone] 04/01/2016, 12:41 PM  If 7PM-7AM, please contact  night-coverage at [email or web address], password [facility]

## 2016-04-01 NOTE — ED Notes (Signed)
Pt given snack pack; family at bedside helping with oral intake

## 2016-04-01 NOTE — Progress Notes (Signed)
SLP Cancellation Note  Patient Details Name: [name] MRN: [id number] DOB: 1945-10-20   Cancelled treatment:       Reason Eval/Treat Not Completed: Fatigue/lethargy limiting ability to participate  -  Unable to arouse pt   Colbe Viviano B. Rutherford Nail, M.S., [place] 04/01/2016, 3:21 PM

## 2016-04-02 ENCOUNTER — Inpatient Hospital Stay (HOSPITAL_COMMUNITY): Payer: Medicare Other

## 2016-04-02 DIAGNOSIS — I5032 Chronic diastolic (congestive) heart failure: Secondary | ICD-10-CM

## 2016-04-02 DIAGNOSIS — J441 Chronic obstructive pulmonary disease with (acute) exacerbation: Secondary | ICD-10-CM

## 2016-04-02 DIAGNOSIS — A419 Sepsis, unspecified organism: Principal | ICD-10-CM

## 2016-04-02 DIAGNOSIS — J9622 Acute and chronic respiratory failure with hypercapnia: Secondary | ICD-10-CM

## 2016-04-02 DIAGNOSIS — G9340 Encephalopathy, unspecified: Secondary | ICD-10-CM

## 2016-04-02 DIAGNOSIS — J189 Pneumonia, unspecified organism: Secondary | ICD-10-CM

## 2016-04-02 DIAGNOSIS — R627 Adult failure to thrive: Secondary | ICD-10-CM

## 2016-04-02 LAB — BASIC METABOLIC PANEL
Anion gap: 4 — ABNORMAL LOW (ref 5–15)
BUN: 7 mg/dL (ref 6–20)
CHLORIDE: 102 mmol/L (ref 101–111)
CO2: 37 mmol/L — ABNORMAL HIGH (ref 22–32)
CREATININE: 0.75 mg/dL (ref 0.44–1.00)
Calcium: 7.4 mg/dL — ABNORMAL LOW (ref 8.9–10.3)
Glucose, Bld: 139 mg/dL — ABNORMAL HIGH (ref 65–99)
POTASSIUM: 4.2 mmol/L (ref 3.5–5.1)
SODIUM: 143 mmol/L (ref 135–145)

## 2016-04-02 LAB — BLOOD GAS, ARTERIAL
Acid-Base Excess: 10.7 mmol/L — ABNORMAL HIGH (ref 0.0–2.0)
Acid-Base Excess: 8.8 mmol/L — ABNORMAL HIGH (ref 0.0–2.0)
BICARBONATE: 39.6 mmol/L — AB (ref 20.0–28.0)
Bicarbonate: 34.5 mmol/L — ABNORMAL HIGH (ref 20.0–28.0)
DRAWN BY: 244861
Drawn by: 40662
FIO2: 60
MECHVT: 320 mL
O2 Content: 2 L/min
O2 SAT: 95.9 %
O2 Saturation: 99.4 %
PATIENT TEMPERATURE: 98.6
PEEP/CPAP: 5 cmH2O
PO2 ART: 88.3 mmHg (ref 83.0–108.0)
PO2 ART: 199 mmHg — AB (ref 83.0–108.0)
Patient temperature: 98.6
RATE: 30 resp/min
pCO2 arterial: 63.8 mmHg — ABNORMAL HIGH (ref 32.0–48.0)
pH, Arterial: 7.145 — CL (ref 7.350–7.450)
pH, Arterial: 7.351 (ref 7.350–7.450)

## 2016-04-02 LAB — CBC
HCT: 27.4 % — ABNORMAL LOW (ref 36.0–46.0)
HEMOGLOBIN: 7.6 g/dL — AB (ref 12.0–15.0)
MCH: 28.4 pg (ref 26.0–34.0)
MCHC: 27.7 g/dL — ABNORMAL LOW (ref 30.0–36.0)
MCV: 102.2 fL — ABNORMAL HIGH (ref 78.0–100.0)
Platelets: 238 10*3/uL (ref 150–400)
RBC: 2.68 MIL/uL — AB (ref 3.87–5.11)
RDW: 16.4 % — ABNORMAL HIGH (ref 11.5–15.5)
WBC: 7.7 10*3/uL (ref 4.0–10.5)

## 2016-04-02 LAB — GLUCOSE, CAPILLARY
GLUCOSE-CAPILLARY: 117 mg/dL — AB (ref 65–99)
GLUCOSE-CAPILLARY: 157 mg/dL — AB (ref 65–99)
Glucose-Capillary: 123 mg/dL — ABNORMAL HIGH (ref 65–99)
Glucose-Capillary: 157 mg/dL — ABNORMAL HIGH (ref 65–99)
Glucose-Capillary: 147 mg/dL — ABNORMAL HIGH (ref 65–99)

## 2016-04-02 LAB — PROCALCITONIN: PROCALCITONIN: 0.24 ng/mL

## 2016-04-02 LAB — POCT I-STAT 3, ART BLOOD GAS (G3+)
Acid-Base Excess: >30 mmol/L — ABNORMAL HIGH (ref 0.0–2.0)
BICARBONATE: 58.7 mmol/L — AB (ref 20.0–28.0)
O2 Saturation: 96 %
PATIENT TEMPERATURE: 97.6
PCO2 ART: 80.1 mmHg — AB (ref 32.0–48.0)
TCO2: >50 mmol/L (ref 0–100)
pH, Arterial: 7.471 — ABNORMAL HIGH (ref 7.350–7.450)
pO2, Arterial: 79 mmHg — ABNORMAL LOW (ref 83.0–108.0)

## 2016-04-02 LAB — URINE CULTURE: Culture: NO GROWTH

## 2016-04-02 LAB — MAGNESIUM: Magnesium: 1.7 mg/dL (ref 1.7–2.4)

## 2016-04-02 MED ORDER — METOPROLOL TARTRATE 5 MG/5ML IV SOLN
2.5000 mg | INTRAVENOUS | Status: DC | PRN
  Administered 2016-04-03 – 2016-04-09 (×4): 5 mg via INTRAVENOUS
  Filled 2016-04-02 (×4): qty 5

## 2016-04-02 MED ORDER — ALBUTEROL (5 MG/ML) CONTINUOUS INHALATION SOLN
10.0000 mg/h | INHALATION_SOLUTION | RESPIRATORY_TRACT | Status: DC
  Administered 2016-04-02: 10 mg/h via RESPIRATORY_TRACT
  Filled 2016-04-02: qty 20

## 2016-04-02 MED ORDER — CHLORHEXIDINE GLUCONATE 0.12% ORAL RINSE (MEDLINE KIT): 15.0000 mL | Freq: Two times a day (BID) | OROMUCOSAL | Status: DC

## 2016-04-02 MED ORDER — IPRATROPIUM-ALBUTEROL 0.5-2.5 (3) MG/3ML IN SOLN
3.0000 mL | RESPIRATORY_TRACT | Status: DC
  Administered 2016-04-02 – 2016-04-06 (×27): 3 mL via RESPIRATORY_TRACT
  Filled 2016-04-02 (×27): qty 3

## 2016-04-02 MED ORDER — BUPROPION HCL 75 MG PO TABS
150.0000 mg | ORAL_TABLET | Freq: Three times a day (TID) | ORAL | Status: DC
  Administered 2016-04-03 – 2016-04-12 (×24): 150 mg via ORAL
  Filled 2016-04-02 (×32): qty 2

## 2016-04-02 MED ORDER — FENTANYL CITRATE (PF) 100 MCG/2ML IJ SOLN
25.0000 ug | INTRAMUSCULAR | Status: DC | PRN
  Administered 2016-04-02 – 2016-04-06 (×9): 50 ug via INTRAVENOUS
  Filled 2016-04-02 (×10): qty 2

## 2016-04-02 MED ORDER — MIDAZOLAM HCL 2 MG/2ML IJ SOLN
1.0000 mg | INTRAMUSCULAR | Status: DC | PRN
  Administered 2016-04-02 (×4): 2 mg via INTRAVENOUS
  Administered 2016-04-03: 4 mg via INTRAVENOUS
  Administered 2016-04-03: 2 mg via INTRAVENOUS
  Administered 2016-04-04: 4 mg via INTRAVENOUS
  Administered 2016-04-04 (×3): 2 mg via INTRAVENOUS
  Administered 2016-04-05 (×2): 4 mg via INTRAVENOUS
  Administered 2016-04-05 (×2): 3 mg via INTRAVENOUS
  Administered 2016-04-05: 4 mg via INTRAVENOUS
  Administered 2016-04-06: 2 mg via INTRAVENOUS
  Administered 2016-04-06 (×4): 4 mg via INTRAVENOUS
  Administered 2016-04-06: 2 mg via INTRAVENOUS
  Administered 2016-04-06 – 2016-04-07 (×4): 4 mg via INTRAVENOUS
  Administered 2016-04-07 – 2016-04-08 (×4): 2 mg via INTRAVENOUS
  Filled 2016-04-02 (×2): qty 2
  Filled 2016-04-02: qty 4
  Filled 2016-04-02: qty 2
  Filled 2016-04-02 (×4): qty 4
  Filled 2016-04-02 (×2): qty 2
  Filled 2016-04-02 (×6): qty 4
  Filled 2016-04-02 (×5): qty 2
  Filled 2016-04-02 (×3): qty 4
  Filled 2016-04-02 (×2): qty 2
  Filled 2016-04-02 (×3): qty 4

## 2016-04-02 MED ORDER — CHLORHEXIDINE GLUCONATE 0.12% ORAL RINSE (MEDLINE KIT)
15.0000 mL | Freq: Two times a day (BID) | OROMUCOSAL | Status: DC
  Administered 2016-04-02 – 2016-04-08 (×13): 15 mL via OROMUCOSAL

## 2016-04-02 MED ORDER — SODIUM CHLORIDE 0.9 % IV BOLUS (SEPSIS)
500.0000 mL | Freq: Once | INTRAVENOUS | Status: AC
  Administered 2016-04-02: 500 mL via INTRAVENOUS

## 2016-04-02 MED ORDER — FAMOTIDINE IN NACL 20-0.9 MG/50ML-% IV SOLN
20.0000 mg | INTRAVENOUS | Status: DC
  Administered 2016-04-02 – 2016-04-08 (×7): 20 mg via INTRAVENOUS
  Filled 2016-04-02 (×8): qty 50

## 2016-04-02 MED ORDER — INSULIN ASPART 100 UNIT/ML ~~LOC~~ SOLN
0.0000 [IU] | SUBCUTANEOUS | Status: DC
  Administered 2016-04-02: 2 [IU] via SUBCUTANEOUS
  Administered 2016-04-02 – 2016-04-03 (×4): 1 [IU] via SUBCUTANEOUS
  Administered 2016-04-03: 3 [IU] via SUBCUTANEOUS
  Administered 2016-04-03: 1 [IU] via SUBCUTANEOUS
  Administered 2016-04-04: 3 [IU] via SUBCUTANEOUS
  Administered 2016-04-04: 2 [IU] via SUBCUTANEOUS
  Administered 2016-04-04 (×2): 3 [IU] via SUBCUTANEOUS
  Administered 2016-04-04: 2 [IU] via SUBCUTANEOUS
  Administered 2016-04-04: 3 [IU] via SUBCUTANEOUS
  Administered 2016-04-05 (×2): 2 [IU] via SUBCUTANEOUS
  Administered 2016-04-05: 5 [IU] via SUBCUTANEOUS
  Administered 2016-04-05 – 2016-04-06 (×3): 2 [IU] via SUBCUTANEOUS
  Administered 2016-04-06: 3 [IU] via SUBCUTANEOUS
  Administered 2016-04-06: 1 [IU] via SUBCUTANEOUS
  Administered 2016-04-06 (×3): 2 [IU] via SUBCUTANEOUS
  Administered 2016-04-07: 1 [IU] via SUBCUTANEOUS
  Administered 2016-04-07: 2 [IU] via SUBCUTANEOUS
  Administered 2016-04-07 (×3): 1 [IU] via SUBCUTANEOUS
  Administered 2016-04-07: 2 [IU] via SUBCUTANEOUS
  Administered 2016-04-08: 1 [IU] via SUBCUTANEOUS
  Administered 2016-04-08 (×3): 2 [IU] via SUBCUTANEOUS

## 2016-04-02 MED ORDER — ETOMIDATE 2 MG/ML IV SOLN
20.0000 mg | Freq: Once | INTRAVENOUS | Status: AC
  Administered 2016-04-02: 20 mg via INTRAVENOUS

## 2016-04-02 MED ORDER — ROCURONIUM BROMIDE 50 MG/5ML IV SOLN
50.0000 mg | Freq: Once | INTRAVENOUS | Status: AC
  Administered 2016-04-02: 50 mg via INTRAVENOUS

## 2016-04-02 MED ORDER — ORAL CARE MOUTH RINSE
15.0000 mL | Freq: Four times a day (QID) | OROMUCOSAL | Status: DC
  Administered 2016-04-02 – 2016-04-08 (×25): 15 mL via OROMUCOSAL

## 2016-04-02 MED ORDER — NALOXONE HCL 0.4 MG/ML IJ SOLN
INTRAMUSCULAR | Status: AC
  Administered 2016-04-02: 0.4 mg
  Filled 2016-04-02: qty 1

## 2016-04-02 MED ORDER — FENTANYL CITRATE (PF) 100 MCG/2ML IJ SOLN
100.0000 ug | Freq: Once | INTRAMUSCULAR | Status: AC
  Administered 2016-04-02: 100 ug via INTRAVENOUS

## 2016-04-02 MED ORDER — MIDAZOLAM HCL 2 MG/2ML IJ SOLN
INTRAMUSCULAR | Status: AC
  Filled 2016-04-02: qty 4

## 2016-04-02 MED ORDER — MIDAZOLAM HCL 2 MG/2ML IJ SOLN
2.0000 mg | Freq: Once | INTRAMUSCULAR | Status: AC
  Administered 2016-04-02: 2 mg via INTRAVENOUS

## 2016-04-02 MED ORDER — ORAL CARE MOUTH RINSE: 15.0000 mL | Freq: Four times a day (QID) | OROMUCOSAL | Status: DC

## 2016-04-02 MED ORDER — DEXMEDETOMIDINE HCL IN NACL 200 MCG/50ML IV SOLN
0.4000 ug/kg/h | INTRAVENOUS | Status: DC
  Administered 2016-04-02: 0.4 ug/kg/h via INTRAVENOUS
  Administered 2016-04-03: 0.8 ug/kg/h via INTRAVENOUS
  Administered 2016-04-03 (×2): 0.4 ug/kg/h via INTRAVENOUS
  Administered 2016-04-03: 0.7 ug/kg/h via INTRAVENOUS
  Administered 2016-04-04 (×2): 1 ug/kg/h via INTRAVENOUS
  Administered 2016-04-04: 0.8 ug/kg/h via INTRAVENOUS
  Administered 2016-04-04: 1 ug/kg/h via INTRAVENOUS
  Filled 2016-04-02 (×8): qty 50

## 2016-04-02 MED ORDER — FENTANYL CITRATE (PF) 100 MCG/2ML IJ SOLN
INTRAMUSCULAR | Status: AC
  Filled 2016-04-02: qty 2

## 2016-04-02 MED ORDER — ALBUTEROL SULFATE (2.5 MG/3ML) 0.083% IN NEBU
INHALATION_SOLUTION | RESPIRATORY_TRACT | Status: AC
  Filled 2016-04-02: qty 12

## 2016-04-02 NOTE — Significant Event (Signed)
Rapid Response Event Note  Overview: Time Called: [id number] Arrival Time: 0845 Event Type: Respiratory, Neurologic  Initial Focused Assessment: Unable to awake patient, minimal to no gag reflex. Thick beige secretions,  Lung sounds rhonchi/diminished  125/65  ST 112  RR 18  O2 sat 100% on 2l [place]  Interventions: MD at bedside Consulted CCM Patient transported to 2M05 via bed with O2 and heart monitor ICU Rn at bedside to receive patient, [place] gave bedside report Per CCM trail of bipap,  Patient placed on Bipap   Plan of Care (if not transferred):  Event Summary: Name of Physician Notified: Dr Ebony Hail at [phone]  Name of Consulting Physician Notified: Dr Ashok Cordia at [phone]  Outcome: Transferred (Comment)  Event End Time: 0920  Raliegh Ip

## 2016-04-02 NOTE — Procedures (Signed)
Pt placed on Bipap at this time per MD order, pt tolerating well, RT will monitor  

## 2016-04-02 NOTE — Progress Notes (Signed)
Late Entry  During this AM's assessment patient noted to be much more lethargic than yesterday. VERY minimally responsive to painful stimuli. Vitals stable, but patient's breathing is labored and shallow. Lungs with rhonchi and coarse crackles throughout. Very minimal urine output over last 24 hours. Pupils reactive, but sluggish. Dr. Maryland Pink notified of all the above. Stat orders for ABG's received. Patient received norco and xanax over 24 hours ago, but narcan administered at 0850 per rapid response RN recommendations. Patient VERY minimally more responsive after receiving narcan, but still unable to follow commands or open eyes. Patient not protecting airway appropriately and unable to clear thick secretions. ABG's resulted with critical results. Patient emergently transferred to 6M with primary RN, respiratory therapist, and rapid response RN at bedside.   Joellen Jersey, RN.

## 2016-04-02 NOTE — Consult Note (Signed)
PULMONARY / CRITICAL CARE MEDICINE   Name: [name] MRN: [id number] DOB: [birth year]    ADMISSION DATE:  03/31/2016 CONSULTATION DATE:  1/16  REFERRING MD:  Dr. Maryland Pink  CHIEF COMPLAINT:  AMS  HISTORY OF PRESENT ILLNESS:  71 year old female with past medical history as below, which is significant for COPD on home O2, diastolic CHF, atrial fibrillation on full dose aspirin, protein calorie malnutrition, and recent admission (12/2015) for MRSA pneumonia requiring mechanical ventilation. During that admission she was dilated by palliative care medicine and it was deemed that she would remain a full code with aggressive measures. Family reports that since her recent admissions she has never fully regained her strength and has been limited in her function at home, and has had a very poor appetite. These findings progressively worsened in the days leading to her presentation to [facility] 1/14 accompanied with complaints of worsening chronic cough and thick yellow sputum production.   Upon arrival to the emergency department she was afebrile requiring supplemental oxygen to maintain sats above 90%. Chest x-ray concerning for left upper lobe infiltrate. She was admitted to the hospitalist service for treatment of HCAP with broad-spectrum antibiotics and questionable COPD exacerbation. Hospital course complicated by progressive lethargy, which progressed to near unresponsiveness on the morning of 1/16. ABG at that time significant for pH 7.16. PCCM was consulted for further valuation.  PAST MEDICAL HISTORY :  She  has a past medical history of Anemia; Anxiety; Anxiety disorder; Arthritis; Asthma; Atrial fibrillation ([place]); Avascular necrosis of hip (HCC); CHF (congestive heart failure) ([place]); Chronic bronchitis ([place]); Chronic lower back pain; COPD (chronic obstructive pulmonary disease) ([place]); Depression; ETOH abuse; Heart murmur; High cholesterol; History of blood transfusion (12/2015); HTN  (hypertension); Hyperglycemia; On home O2; Pneumonia; and Psoriatic arthritis ([place]).  PAST SURGICAL HISTORY: She  has a past surgical history that includes Total hip arthroplasty (Right, 05/15/2008); Joint replacement; Tonsillectomy and adenoidectomy; and Tubal ligation.  Allergies  Allergen Reactions  . Celexa [Citalopram Hydrobromide] Other (See Comments)    "Jerky movements"  . Nickel     Unknown reaction  . Tramadol Hcl Nausea And Vomiting    No current facility-administered medications on file prior to encounter.    Current Outpatient Prescriptions on File Prior to Encounter  Medication Sig  . albuterol (PROAIR HFA) 108 (90 BASE) MCG/ACT inhaler INHALE 2 PUFFS INTO THE LUNGS EVERY 6 HOURS AS NEEDED FOR WHEEZING OR SHORTNESS OF BREATH  . albuterol (PROVENTIL) (2.5 MG/3ML) 0.083% nebulizer solution Take 2.5 mg by nebulization every morning.  Marland Kitchen ALPRAZolam (XANAX) 0.5 MG tablet Take 1 tablet (0.5 mg total) by mouth 2 (two) times daily as needed for anxiety.  Marland Kitchen amitriptyline (ELAVIL) 50 MG tablet Take 1 tablet by mouth at  bedtime  . amoxicillin (AMOXIL) 875 MG tablet Take 1 tablet (875 mg total) by mouth 2 (two) times daily. 1 po BID  . aspirin 325 MG tablet Take 325 mg by mouth every evening.   Marland Kitchen buPROPion (WELLBUTRIN XL) 150 MG 24 hr tablet TAKE 1 TABLET BY MOUTH  DAILY  . cetirizine (ALL DAY ALLERGY) 10 MG tablet Take 10 mg by mouth at bedtime.   . cholecalciferol (VITAMIN D) 1000 UNITS tablet Take 1,000 Units by mouth every morning.   Marland Kitchen EQL CALCIUM CITRATE/VITAMIN D PO Take 1 each by mouth 2 (two) times daily. Vitamin D 500IU with Calcium Citrate 630mg   Chewables  . furosemide (LASIX) 20 MG tablet Take 1 tablet by mouth  daily (Patient taking  differently: Take 1 tablet by mouth  daily in the morning)  . guaifenesin (MUCUS RELIEF) 400 MG TABS tablet Take 400 mg by mouth 2 (two) times daily.   Marland Kitchen HYDROcodone-acetaminophen (NORCO) 10-325 MG tablet Take 1 tablet by mouth 2 (two) times  daily.  Marland Kitchen HYDROcodone-homatropine (HYCODAN) 5-1.5 MG/5ML syrup Take 5 mLs by mouth every 6 (six) hours as needed for cough.  . metoprolol (LOPRESSOR) 50 MG tablet Take 1 tablet by mouth two  times daily  . potassium chloride SA (K-DUR,KLOR-CON) 20 MEQ tablet Take 2 tablets (40 mEq total) by mouth daily.  Marland Kitchen tiotropium (SPIRIVA) 18 MCG inhalation capsule Place 1 capsule (18 mcg total) into inhaler and inhale every morning.  . vitamin C (ASCORBIC ACID) 500 MG tablet Take 500 mg by mouth every morning.  . predniSONE (DELTASONE) 20 MG tablet Take 40mg  for 2days then 20mg  for 2days then STOP (Patient not taking: Reported on 03/31/2016)    FAMILY HISTORY:  Her indicated that her mother is deceased. She indicated that her father is deceased. She indicated that the status of her sister is unknown. She indicated that the status of her brother is unknown. She indicated that her paternal grandfather is deceased.    SOCIAL HISTORY: She  reports that she quit smoking about 7 years ago. She has a 35.00 pack-year smoking history. She has never used smokeless tobacco. She reports that she drinks about 8.4 oz of alcohol per week . She reports that she does not use drugs.  REVIEW OF SYSTEMS:   Unable due to acute encephalopathy  SUBJECTIVE:  Unable due to acute encephalopathy  VITAL SIGNS: BP (!) 119/55 (BP Location: Right Arm)   Pulse (!) 112   Temp 97.2 F (36.2 C) (Oral)   Resp 19   Ht 5' (1.524 m)   Wt 45.4 kg (100 lb 1.4 oz)   SpO2 99%   BMI 19.55 kg/m   HEMODYNAMICS:    VENTILATOR SETTINGS:    INTAKE / OUTPUT: I/O last 3 completed shifts: In: 3535.5 [P.O.:420; I.V.:1000.5; IV Piggyback:2115] Out: 550 [Urine:550]  PHYSICAL EXAMINATION: General:  Frail elderly female Neuro:  Facial grimace only to sternal rub HEENT:  [place]/AT, PERRL, no JVD Cardiovascular:  RRR< no MRG Lungs:  Coarse expiratory wheeze Abdomen:  Soft, non-tender, non-distended Musculoskeletal:  No acute  deformity Skin:  Wounds to bilateral lower extremities (L anterior tibial, R dorsal foot) Both well circumscribed, shallow, without s/s infection.   LABS:  BMET  Recent Labs Lab 04/01/16 0239 04/01/16 1622 04/02/16 0227  NA 140 144 143  K 2.5* 3.6 4.2  CL 93* 100* 102  CO2 38* 38* 37*  BUN 6 6 7   CREATININE 0.75 0.73 0.75  GLUCOSE 138* 195* 139*    Electrolytes  Recent Labs Lab 04/01/16 0239 04/01/16 0955 04/01/16 1622 04/02/16 0227  CALCIUM 6.8*  --  7.3* 7.4*  MG  --  1.8  --  1.7    CBC  Recent Labs Lab 03/31/16 1930 04/01/16 0239 04/02/16 0227  WBC 21.8* 12.3* 7.7  HGB 9.1* 7.6* 7.6*  HCT 31.8* 26.6* 27.4*  PLT 463* 236 238    Coag's  Recent Labs Lab 04/01/16 0725  APTT 32  INR 1.26    Sepsis Markers  Recent Labs Lab 03/31/16 1956 03/31/16 2156 04/01/16 0236 04/01/16 0725  LATICACIDVEN 5.71* 1.07 0.7  --   PROCALCITON  --   --   --  0.30    ABG  Recent Labs Lab 04/02/16 0854  PHART 7.145*  PCO2ART ABOVE REPORTABLE RANGE  PO2ART 88.3    Liver Enzymes  Recent Labs Lab 03/31/16 1930 04/01/16 0239  AST 26 13*  ALT 12* 10*  ALKPHOS 101 69  BILITOT 1.1 0.9  ALBUMIN 2.0* 1.5*    Cardiac Enzymes No results for input(s): TROPONINI, PROBNP in the last 168 hours.  Glucose  Recent Labs Lab 04/01/16 0816 04/01/16 1150 04/01/16 1554 04/01/16 2029 04/01/16 2132 04/02/16 0749  GLUCAP 159* 154* 188* 200* 207* 117*    Imaging No results found.   STUDIES:    CULTURES: [facility] 1/14 >>> Sputum 1/14 >>> Urine 1/14 >>>  ANTIBIOTICS: Cefepime 1/14>>> Levaquin 1/14>>> Vanco 1/14 >>>  SIGNIFICANT EVENTS: 12/2015 admit for [facility] PNA on vent  LINES/TUBES:    ASSESSMENT / PLAN:  71 year old female with severe COPD on home O2. She is admitted back in October for MRSA pneumonia and required mechanical ventilation during that admission. Since that time family reports she's been very frail, weak, and minimally functional  at home. Poor by mouth intake. She was again admitted 1/14 to the hospitalist team for treatment of HCAP and COPD exacerbation. 1/16 her mentation worsened secondary to hypercarbic respiratory failure and she was transferred to ICU for potential intubation.   COPD with acute exacerbation - Transfer to ICU  - Stat BiPAP, we'll trial this however she may need intubation  - Stat continuous albuterol treatment  - Scheduled and when necessary bronchodilators  - IV Solu-Medrol   Sepsis secondary to HCAP  - Continue broad-spectrum antibiotics pending cultures  - Procalcitonin algorithm   Chronic diastolic CHF - Holding Lasix due to prolonged hypotension  Bilateral lung wounds  - nonhealing ulcerations, recent imaging without cause for concern. - Wound care following   Protein calorie malnutrition  - We will initiate tube feeds if intubated  - We'll need SLP evaluation once patient is more awake  - May need to re-involve palliative care   Fracture of the right greater trochanter  - Family denied orthopedics consult   Best practice: - DVT prophylaxis with Lovenox  - Full code, confirmed with husband   - Protonix for stress ulcer prophylaxis  - ICU status   APP critical care time 40 mins  Georgann Housekeeper, AGACNP-BC [facility] Pulmonology/Critical Care Pager [phone] or [phone]  04/02/2016 9:48 AM

## 2016-04-02 NOTE — Progress Notes (Signed)
SLP Cancellation Note  Patient Details Name: COYE KASSAM MRN: [id number] DOB: [birth year]   Cancelled treatment:       Reason Eval/Treat Not Completed: Medical issues which prohibited therapy. Pt intubated, will sign off   Haneen Bernales, Katherene Ponto 04/02/2016, 11:03 AM

## 2016-04-02 NOTE — Procedures (Signed)
Intubation Procedure Note [name] [id number] [birth year]  Procedure: Intubation Indications: Respiratory insufficiency  Procedure Details Consent: Risks of procedure as well as the alternatives and risks of each were explained to the (patient/caregiver).  Consent for procedure obtained. Time Out: Verified patient identification, verified procedure, site/side was marked, verified correct patient position, special equipment/implants available, medications/allergies/relevent history reviewed, required imaging and test results available.  Performed  MAC and 3 Medications:  Fentanyl  Etomidate 20 mg Versed 4 mg NMB rocuronium 50 mg   Evaluation Hemodynamic Status: BP stable throughout; O2 sats: stable throughout Patient's Current Condition: stable Complications: No apparent complications Patient did tolerate procedure well. Chest X-ray ordered to verify placement.  CXR: pending.   Richardson Landry Odeal Welden ACNP Maryanna Shape PCCM Pager [phone] till 3 pm If no answer page [phone] 04/02/2016, 10:23 AM

## 2016-04-02 NOTE — Progress Notes (Signed)
PT Cancellation Note  Patient Details Name: [name] MRN: [id number] DOB: [birth year]   Cancelled Treatment:    Reason Eval/Treat Not Completed: Medical issues which prohibited therapy. Pt transferred to ICU and now on bipap. Will check back at later date.   Shary Decamp [place] 04/02/2016, 9:48 AM Suanne Marker PT [phone]

## 2016-04-02 NOTE — Progress Notes (Signed)
eLink Physician-Brief Progress Note Patient Name: BAYYINAH HARER DOB: 18-Feb-1946 MRN: [id number]   Date of Service  04/02/2016  HPI/Events of Note  Runs of AF-RVR BP dropped with precedex  eICU Interventions  Dc precedex Use fent prn, current RASS-3 NS 500 bolus Lopressor prn for HR > 130 if BP permits     Intervention Category Major Interventions: Arrhythmia - evaluation and management  Brealyn Baril V. 04/02/2016, 4:58 PM

## 2016-04-02 NOTE — Procedures (Signed)
  Central Venous Catheter Insertion Procedure Note PREETHI LIGHTFOOT [id number] [birth year]  Procedure: Insertion of Central Venous Catheter Indications: Assessment of intravascular volume, Drug and/or fluid administration and Frequent blood sampling  Procedure Details Consent: Risks of procedure as well as the alternatives and risks of each were explained to the (patient/caregiver).  Consent for procedure obtained. Time Out: Verified patient identification, verified procedure, site/side was marked, verified correct patient position, special equipment/implants available, medications/allergies/relevent history reviewed, required imaging and test results available.  Performed  Maximum sterile technique was used including antiseptics, cap, gloves, gown, hand hygiene, mask and sheet. Skin prep: Chlorhexidine; local anesthetic administered A antimicrobial bonded/coated triple lumen catheter was placed in the left internal jugular vein using the Seldinger technique. Ultrasound guidance used.Yes.   Catheter placed to 20 cm. Blood aspirated via all 3 ports and then flushed x 3. Line sutured x 2 and dressing applied.  Evaluation Blood flow good Complications: No apparent complications Patient did tolerate procedure well. Chest X-ray ordered to verify placement.  CXR: pending.  Richardson Landry Addeline Calarco ACNP Maryanna Shape PCCM Pager [phone] till 3 pm If no answer page [phone] 04/02/2016, 10:38 AM

## 2016-04-02 NOTE — Procedures (Signed)
Pt intubated at this time at bedside by MD, placed pt on ordered settings, tolerating well, will follow up with ABG X1hr

## 2016-04-02 NOTE — Care Management Note (Signed)
Case Management Note  Patient Details  Name: [name] MRN: [id number] Date of Birth: 04/20/1945  Subjective/Objective:    Pt admitted for sepsis and PNA - transferred to 65M from floor and intubated 04/02/16                 Action/Plan:  PTA from home with husband.  CM will continue to follow for discharge needs   Expected Discharge Date:                  Expected Discharge Plan:     In-House Referral:     Discharge planning Services  CM Consult  Post Acute Care Choice:    Choice offered to:     DME Arranged:    DME Agency:     HH Arranged:    HH Agency:     Status of Service:  In process, will continue to follow  If discussed at Long Length of Stay Meetings, dates discussed:    Additional Comments:  Maryclare Labrador, RN 04/02/2016, 1:50 PM

## 2016-04-02 NOTE — Progress Notes (Signed)
[facility] PROGRESS NOTE  [name] [id number] DOB: 20-Dec-1945 DOA: 03/31/2016  PCP: Chevis Pretty, FNP  Brief History/Interval Summary: 71 y.o. female with medical history significant for COPD with chronic respiratory failure, hypertension, depression, anxiety, chronic diastolic CHF, and severe protein calorie malnutrition who presented to the emergency department for evaluation of productive cough, generalized weakness, loss of appetite, and lethargy. She was found to have sepsis secondary to healthcare associated pneumonia.  Reason for Visit: Sepsis secondary to healthcare associated pneumonia.  Consultants: None  Procedures: None  Antibiotics: Vancomycin, cefepime and Levaquin  Subjective/Interval History: Patient not responsive this morning.  ROS: Unable to do at this time  Objective:  Vital Signs  Vitals:   04/02/16 0308 04/02/16 0334 04/02/16 0750 04/02/16 0836  BP:  (!) 119/57 116/74 (!) 119/55  Pulse:  (!) 101 96 (!) 112  Resp:  _0 Temp:  97.6 F (36.4 C) 97.2 F (36.2 C)   TempSrc:  Oral Oral   SpO2:  96% 100% 99%  Weight: 45.4 kg (100 lb 1.4 oz)     Height:        Intake/Output Summary (Last 24 hours) at 04/02/16 0911 Last data filed at 04/02/16 0825  Gross per 24 hour  Intake          2226.75 ml  Output              475 ml  Net          1751.75 ml   Filed Weights   04/01/16 0248 04/01/16 0645 04/02/16 0308  Weight: 43.2 kg (95 lb 3.8 oz) 43 kg (94 lb 12.8 oz) 45.4 kg (100 lb 1.4 oz)    General appearance: Somnolent. Not responding even to painful stimuli. Noted to be tachypneic. Resp: Crackles bilateral bases. No wheezing, no rhonchi. Cardio: S1, S2 is tachycardic. Regular. No S3, S4. GI: soft, non-tender; bowel sounds normal; no masses,  no organomegaly Extremities: extremities normal, atraumatic, no cyanosis or edema Neurologic: Somnolent. Not Arousable. Twitching movements noted..  Lab Results:  Data  Reviewed: I have personally reviewed following labs and imaging studies  CBC:  Recent Labs Lab 03/31/16 1930 04/01/16 0239 04/02/16 0227  WBC 21.8* 12.3* 7.7  NEUTROABS 17.7*  --   --   HGB 9.1* 7.6* 7.6*  HCT 31.8* 26.6* 27.4*  MCV 97.2 98.2 102.2*  PLT 463* 236 518    Basic Metabolic Panel:  Recent Labs Lab 03/31/16 1930 04/01/16 0239 04/01/16 0955 04/01/16 1622 04/02/16 0227  NA 139 140  --  144 143  K 2.3* 2.5*  --  3.6 4.2  CL 74* 93*  --  100* 102  CO2 44* 38*  --  38* 37*  GLUCOSE 64* 138*  --  195* 139*  BUN 7 6  --  6 7  CREATININE 0.99 0.75  --  0.73 0.75  CALCIUM 8.6* 6.8*  --  7.3* 7.4*  MG  --   --  1.8  --  1.7    GFR: Estimated Creatinine Clearance: 46.2 mL/min (by C-G formula based on SCr of 0.75 mg/dL).  Liver Function Tests:  Recent Labs Lab 03/31/16 1930 04/01/16 0239  AST 26 13*  ALT 12* 10*  ALKPHOS 101 69  BILITOT 1.1 0.9  PROT 7.0 5.3*  ALBUMIN 2.0* 1.5*     Recent Labs Lab 03/31/16 1930  LIPASE 15    Coagulation Profile:  Recent Labs Lab 04/01/16 0725  INR 1.26  CBG:  Recent Labs Lab 04/01/16 1150 04/01/16 1554 04/01/16 2029 04/01/16 2132 04/02/16 0749  GLUCAP 154* 188* 200* 207* 117*     Recent Results (from the past 240 hour(s))  Blood Culture (routine x 2)     Status: None (Preliminary result)   Collection Time: 03/31/16  7:30 PM  Result Value Ref Range Status   Specimen Description BLOOD LEFT HAND  Final   Special Requests BOTTLES DRAWN AEROBIC AND ANAEROBIC 5CC  Final   Culture NO GROWTH < 24 HOURS  Final   Report Status PENDING  Incomplete  Blood Culture (routine x 2)     Status: None (Preliminary result)   Collection Time: 03/31/16  7:30 PM  Result Value Ref Range Status   Specimen Description LEFT ANTECUBITAL  Final   Special Requests BOTTLES DRAWN AEROBIC AND ANAEROBIC 5CC  Final   Culture NO GROWTH < 24 HOURS  Final   Report Status PENDING  Incomplete  Culture, sputum-assessment      Status: None   Collection Time: 04/01/16  5:00 AM  Result Value Ref Range Status   Specimen Description SPUTUM  Final   Special Requests Immunocompromised  Final   Sputum evaluation THIS SPECIMEN IS ACCEPTABLE FOR SPUTUM CULTURE  Final   Report Status 04/01/2016 FINAL  Final  Culture, respiratory (NON-Expectorated)     Status: None (Preliminary result)   Collection Time: 04/01/16  5:00 AM  Result Value Ref Range Status   Specimen Description SPUTUM  Final   Special Requests Immunocompromised Reflexed from [id number]  Final   Gram Stain   Final    ABUNDANT WBC PRESENT, PREDOMINANTLY PMN ABUNDANT GRAM POSITIVE RODS MODERATE GRAM NEGATIVE RODS FEW YEAST    Culture PENDING  Incomplete   Report Status PENDING  Incomplete  MRSA PCR Screening     Status: Abnormal   Collection Time: 04/01/16  6:30 AM  Result Value Ref Range Status   MRSA by PCR POSITIVE (A) NEGATIVE Final    Comment:        The GeneXpert MRSA Assay (FDA approved for NASAL specimens only), is one component of a comprehensive MRSA colonization surveillance program. It is not intended to diagnose MRSA infection nor to guide or monitor treatment for MRSA infections. RESULT CALLED TO, READ BACK BY AND VERIFIED WITH: C ECKELMANN,RN AT 3646 04/01/16 BY L BENFIELD       Radiology Studies: Dg Chest 1 View  Result Date: 03/31/2016 CLINICAL DATA:  History of COPD, weakness right hip pain EXAM: CHEST 1 VIEW COMPARISON:  01/02/2016, 10/16/2015, 03/31/2014 FINDINGS: Lungs are hyperinflated. Coarse interstitial opacities compatible with fibrosis. Bilateral upper lobe pleural and parenchymal scarring again noted with slight increased left upper lobe opacity. Bibasilar pleural and parenchymal scarring. Stable cardiomediastinal silhouette. No pneumothorax. IMPRESSION: 1. Hyperinflation with coarse, chronic interstitial changes within the apices and lung bases 2. Slight increased opacity in the apical portion of left upper lobe and  suprahilar left lung, unable to exclude acute process/superimposed infiltrate in this region. Radiographic follow-up is recommended. Electronically Signed   By: Donavan Foil M.D.   On: 03/31/2016 20:34   Dg Hip Unilat With Pelvis 2-3 Views Right  Result Date: 03/31/2016 CLINICAL DATA:  Weakness with hip pain EXAM: DG HIP (WITH OR WITHOUT PELVIS) 2-3V RIGHT COMPARISON:  03/21/2016 FINDINGS: Probe overlies the lower pelvis. The SI joints are grossly symmetric. The left femoral head projects in joint. The pubic symphysis is intact. The patient is status post right hip replacement without acute dislocation.  Again visualized is a fracture of the right greater trochanter of the femur, now with mild superior displacement of the the trochanteric fracture fragment by approximately 9 mm from the lower trochanter and shaft of the right femur. Dense vascular calcifications in the right thigh. IMPRESSION: 1. Status post right hip replacement without acute dislocation 2. Fracture through the greater trochanter of the right femur with new superior displacement of greater trochanter fracture fragment as described above. Electronically Signed   By: Donavan Foil M.D.   On: 03/31/2016 20:38     Medications:  Scheduled: . amitriptyline  50 mg Oral QHS  . aspirin  325 mg Oral QPM  . buPROPion  150 mg Oral Daily  . ceFEPime (MAXIPIME) IV  2 g Intravenous Q24H  . Chlorhexidine Gluconate Cloth  6 each Topical Q0600  . cholecalciferol  1,000 Units Oral q morning - 10a  . enoxaparin (LOVENOX) injection  30 mg Subcutaneous Q24H  . guaiFENesin  400 mg Oral BID  . insulin aspart  0-5 Units Subcutaneous TID AC & HS  . levofloxacin (LEVAQUIN) IV  500 mg Intravenous Q48H  . methylPREDNISolone (SOLU-MEDROL) injection  60 mg Intravenous Q6H  . mupirocin cream   Topical Daily  . mupirocin ointment  1 application Nasal BID  . sodium chloride flush  3 mL Intravenous Q12H  . sodium chloride flush  3 mL Intravenous Q12H  .  vancomycin  500 mg Intravenous Q24H   Continuous: . 0.9 % NaCl with KCl 20 mEq / L 75 mL/hr at 04/02/16 0115   MAU:QJFHLK chloride, acetaminophen **OR** acetaminophen, ALPRAZolam, bisacodyl, HYDROcodone-acetaminophen, ipratropium-albuterol, ondansetron **OR** ondansetron (ZOFRAN) IV, polyethylene glycol, sodium chloride flush  Assessment/Plan:  Principal Problem:   Sepsis ([place]) Active Problems:   COPD (chronic obstructive pulmonary disease) (HCC)   HTN (hypertension)   RA (rheumatoid arthritis) (HCC)   FTT (failure to thrive) in adult   Anxiety   Chronic diastolic CHF (congestive heart failure) (HCC)   Normocytic anemia   Chronic respiratory failure with hypoxia and hypercapnia (HCC)   HCAP (healthcare-associated pneumonia)   Hypokalemia   Open leg wound   Sepsis due to pneumonia ([place])    COPD with exacerbation, acute on chronic hypoxic/hypercarbic respiratory failure  Patient minimally responsive this morning. Suspect respiratory acidosis with hypercapnia which was confirmed by ABG. Discussed with pulmonology. Patient will need to be intubated. To be transferred to intensive care unit.  Sepsis, suspected secondary to HCAP  Patient met sepsis criteria on admission with LUL infiltrate and primarily pulmonary sxs including newly productive cough and increased O2-requirement at rest. Patient started on broad-spectrum antibiotics based on her previous microbiology data. She has been found to have pseudomonas and MRSA previously. Currently on vancomycin, cefepime and Levaquin. Narrow antibiotics based on results of culture. Swallow evaluation.  Chronic diastolic CHF Patient appeared to be dry at the time of admission in the setting of sepsis and recent poor appetite. She was given IV fluids in the emergency department. Currently holding her Lasix. Holding fluids as well. Continue to monitor. Ins and outs and daily weights.  Hypokalemia  Serum potassium 2.3 on admission, likely  secondary to diuretic and poor nutrition. This has been aggressively repleted. Now normal. Magnesium was normal.   Leg wounds Non-healing ulcers noted to bilateral LE's. Wound care has been consulted. Recent x-rays of her feet did not show any acute findings.  Anxiety, depression  Stable. Continue Wellbutrin, Elavil. Watch for sedation.  Normocytic anemia  Hemoglobin is stable.  Right  hip pain  Pt has been evaluated for this by PCP and fracture through greater trochanter noted. Per PCP notes, pt and family declined orhto consultation; they confirmed this in ED. Continue symptomatic management.  DVT Prophylaxis: Lovenox    Code Status: Full code. Was confirmed with patient's family 1/15 Family Communication: No family at bedside Disposition Plan: Transfer to intensive care unit for management by critical care medicine.    LOS: 2 days   [facility] Pager [phone] 04/02/2016, 9:11 AM  If 7PM-7AM, please contact night-coverage at [email or web address], password [facility]

## 2016-04-03 ENCOUNTER — Ambulatory Visit (HOSPITAL_COMMUNITY): Payer: Medicare Other

## 2016-04-03 ENCOUNTER — Inpatient Hospital Stay (HOSPITAL_COMMUNITY): Payer: Medicare Other

## 2016-04-03 DIAGNOSIS — F419 Anxiety disorder, unspecified: Secondary | ICD-10-CM

## 2016-04-03 DIAGNOSIS — J9602 Acute respiratory failure with hypercapnia: Secondary | ICD-10-CM

## 2016-04-03 LAB — CBC WITH DIFFERENTIAL/PLATELET
BASOS ABS: 0 10*3/uL (ref 0.0–0.1)
BASOS PCT: 0 %
EOS ABS: 0 10*3/uL (ref 0.0–0.7)
EOS PCT: 0 %
HEMATOCRIT: 22.1 % — AB (ref 36.0–46.0)
Hemoglobin: 6.2 g/dL — CL (ref 12.0–15.0)
Lymphocytes Relative: 4 %
Lymphs Abs: 0.2 10*3/uL — ABNORMAL LOW (ref 0.7–4.0)
MCH: 28.3 pg (ref 26.0–34.0)
MCHC: 28.1 g/dL — ABNORMAL LOW (ref 30.0–36.0)
MCV: 100.9 fL — ABNORMAL HIGH (ref 78.0–100.0)
MONO ABS: 0.3 10*3/uL (ref 0.1–1.0)
MONOS PCT: 4 %
NEUTROS ABS: 6.1 10*3/uL (ref 1.7–7.7)
Neutrophils Relative %: 92 %
PLATELETS: 164 10*3/uL (ref 150–400)
RBC: 2.19 MIL/uL — ABNORMAL LOW (ref 3.87–5.11)
RDW: 16.5 % — AB (ref 11.5–15.5)
WBC: 6.6 10*3/uL (ref 4.0–10.5)

## 2016-04-03 LAB — RENAL FUNCTION PANEL
ALBUMIN: 1.4 g/dL — AB (ref 3.5–5.0)
Anion gap: 6 (ref 5–15)
BUN: 11 mg/dL (ref 6–20)
CALCIUM: 7.8 mg/dL — AB (ref 8.9–10.3)
CO2: 33 mmol/L — ABNORMAL HIGH (ref 22–32)
CREATININE: 0.81 mg/dL (ref 0.44–1.00)
Chloride: 106 mmol/L (ref 101–111)
GFR calc Af Amer: >60 mL/min (ref >60)
GLUCOSE: 149 mg/dL — AB (ref 65–99)
POTASSIUM: 3.2 mmol/L — AB (ref 3.5–5.1)
SODIUM: 145 mmol/L (ref 135–145)

## 2016-04-03 LAB — CBC
HCT: 27.6 % — ABNORMAL LOW (ref 36.0–46.0)
Hemoglobin: 8.4 g/dL — ABNORMAL LOW (ref 12.0–15.0)
MCH: 28.1 pg (ref 26.0–34.0)
MCHC: 30.4 g/dL (ref 30.0–36.0)
MCV: 92.3 fL (ref 78.0–100.0)
PLATELETS: 154 10*3/uL (ref 150–400)
RBC: 2.99 MIL/uL — ABNORMAL LOW (ref 3.87–5.11)
RDW: 19.7 % — AB (ref 11.5–15.5)
WBC: 10.2 10*3/uL (ref 4.0–10.5)

## 2016-04-03 LAB — GLUCOSE, CAPILLARY
GLUCOSE-CAPILLARY: 107 mg/dL — AB (ref 65–99)
GLUCOSE-CAPILLARY: 138 mg/dL — AB (ref 65–99)
GLUCOSE-CAPILLARY: 232 mg/dL — AB (ref 65–99)
Glucose-Capillary: 133 mg/dL — ABNORMAL HIGH (ref 65–99)
Glucose-Capillary: 131 mg/dL — ABNORMAL HIGH (ref 65–99)
Glucose-Capillary: 131 mg/dL — ABNORMAL HIGH (ref 65–99)

## 2016-04-03 LAB — PHOSPHORUS
Phosphorus: 3.7 mg/dL (ref 2.5–4.6)
Phosphorus: 2.5 mg/dL (ref 2.5–4.6)
Phosphorus: 2 mg/dL — ABNORMAL LOW (ref 2.5–4.6)

## 2016-04-03 LAB — POCT I-STAT 3, ART BLOOD GAS (G3+)
Acid-Base Excess: 8 mmol/L — ABNORMAL HIGH (ref 0.0–2.0)
BICARBONATE: 32.9 mmol/L — AB (ref 20.0–28.0)
O2 Saturation: 93 %
PCO2 ART: 45.4 mmHg (ref 32.0–48.0)
Patient temperature: 98.6
TCO2: 34 mmol/L (ref 0–100)
pH, Arterial: 7.468 — ABNORMAL HIGH (ref 7.350–7.450)
pO2, Arterial: 64 mmHg — ABNORMAL LOW (ref 83.0–108.0)

## 2016-04-03 LAB — HEMOGLOBIN A1C
HEMOGLOBIN A1C: 5.1 % (ref 4.8–5.6)
Mean Plasma Glucose: 100 mg/dL

## 2016-04-03 LAB — MAGNESIUM
MAGNESIUM: 1.5 mg/dL — AB (ref 1.7–2.4)
MAGNESIUM: 3.4 mg/dL — AB (ref 1.7–2.4)
MAGNESIUM: 3 mg/dL — AB (ref 1.7–2.4)
Magnesium: 2.5 mg/dL — ABNORMAL HIGH (ref 1.7–2.4)

## 2016-04-03 LAB — HEMOGLOBIN AND HEMATOCRIT, BLOOD
HEMATOCRIT: 22.2 % — AB (ref 36.0–46.0)
HEMOGLOBIN: 6.2 g/dL — AB (ref 12.0–15.0)

## 2016-04-03 LAB — PREPARE RBC (CROSSMATCH)

## 2016-04-03 LAB — PROCALCITONIN: PROCALCITONIN: 0.18 ng/mL

## 2016-04-03 MED ORDER — VITAL HIGH PROTEIN PO LIQD
1000.0000 mL | ORAL | Status: DC
  Administered 2016-04-04 – 2016-04-05 (×2): 1000 mL
  Administered 2016-04-06 (×4)
  Administered 2016-04-06: 1000 mL
  Administered 2016-04-07 (×9)
  Administered 2016-04-07: 1000 mL
  Administered 2016-04-07 (×3)
  Administered 2016-04-08: 1000 mL
  Administered 2016-04-08 (×8)

## 2016-04-03 MED ORDER — SODIUM CHLORIDE 0.9 % IV SOLN
30.0000 meq | Freq: Once | INTRAVENOUS | Status: AC
  Administered 2016-04-03: 30 meq via INTRAVENOUS
  Filled 2016-04-03: qty 15

## 2016-04-03 MED ORDER — SODIUM CHLORIDE 0.9 % IV SOLN
6.0000 g | Freq: Once | INTRAVENOUS | Status: AC
  Administered 2016-04-03: 6 g via INTRAVENOUS
  Filled 2016-04-03: qty 12

## 2016-04-03 MED ORDER — PRO-STAT SUGAR FREE PO LIQD
30.0000 mL | Freq: Two times a day (BID) | ORAL | Status: DC
  Filled 2016-04-03: qty 30

## 2016-04-03 MED ORDER — VITAL HIGH PROTEIN PO LIQD
1000.0000 mL | ORAL | Status: DC
  Administered 2016-04-03: 1000 mL

## 2016-04-03 MED ORDER — DEXTROSE 5 % IV SOLN
2.0000 g | Freq: Two times a day (BID) | INTRAVENOUS | Status: DC
  Administered 2016-04-03 – 2016-04-04 (×2): 2 g via INTRAVENOUS
  Filled 2016-04-03 (×3): qty 2

## 2016-04-03 MED ORDER — SODIUM PHOSPHATES 45 MMOLE/15ML IV SOLN
30.0000 mmol | Freq: Once | INTRAVENOUS | Status: AC
  Administered 2016-04-03: 30 mmol via INTRAVENOUS
  Filled 2016-04-03: qty 10

## 2016-04-03 MED ORDER — FUROSEMIDE 10 MG/ML IJ SOLN
40.0000 mg | Freq: Once | INTRAMUSCULAR | Status: AC
  Administered 2016-04-03: 40 mg via INTRAVENOUS
  Filled 2016-04-03: qty 4

## 2016-04-03 MED ORDER — SODIUM CHLORIDE 0.9 % IV SOLN
Freq: Once | INTRAVENOUS | Status: AC
  Administered 2016-04-03: 07:00:00 via INTRAVENOUS

## 2016-04-03 NOTE — Procedures (Signed)
ETT pulled back 2cm, now 21@lip . RR decreased to 22 per MD order, will follow up with ABG X1hr.

## 2016-04-03 NOTE — Progress Notes (Signed)
PT Cancellation Note and SIgn Off  Patient Details Name: [name] MRN: [id number] DOB: 08-03-1945   Cancelled Treatment:    Reason Eval/Treat Not Completed: Medical issues which prohibited therapy;Patient's level of consciousness;Patient not medically ready. Pt not currently appropriate for acute PT, discussed with nursing. PT signing off and will await new order if/ when appropriate.    [place] 04/03/2016, 2:23 PM

## 2016-04-03 NOTE — Progress Notes (Signed)
Initial Nutrition Assessment  DOCUMENTATION CODES:   Underweight  INTERVENTION:  Initiate TF via OGT with Vital High Protein at goal rate of 45 ml/h (1080 ml per day) to provide 1080  kcals, 95 gm protein, 907 ml free water daily.   NUTRITION DIAGNOSIS:   Inadequate oral intake related to inability to eat as evidenced by NPO status.   GOAL:   Patient will meet greater than or equal to 90% of their needs   MONITOR:   Vent status, TF tolerance, Skin, I & O's, Weight trends  REASON FOR ASSESSMENT:   Consult, Ventilator Enteral/tube feeding initiation and management  ASSESSMENT:    71 y.o. female with medical history significant for COPD with chronic respiratory failure, hypertension, depression, anxiety, chronic diastolic CHF, and severe protein calorie malnutrition who presents to the emergency department for evaluation of productive cough, generalized weakness, loss of appetite, and lethargy.  Patient is currently intubated on ventilator support MV: 8.2 L/min Temp (24hrs), Avg:98.2 F (36.8 C), Min:97.3 F (36.3 C), Max:98.7 F (37.1 C)  TF feeding recently initiated at 40 ml/hr using Vital High Protein. Pt noted to have severe muscle wasting in legs. Swelling in arms. Pt with multiple wounds.   Labs: low hemoglobin, elevated magnesium  Diet Order:  Diet NPO time specified  Skin:  Wound (see comment) (Stg IV L leg, Stg IV R heel, Stg 1 sacrum)  Last BM:  unknown  Height:   Ht Readings from Last 1 Encounters:  04/02/16 4\' 10"  (1.473 m)    Weight:   Wt Readings from Last 1 Encounters:  04/03/16 95 lb 10.9 oz (43.4 kg)    Ideal Body Weight:  43.9 kg  BMI:  Body mass index is 20 kg/m.  Estimated Nutritional Needs:   Kcal:  1048  Protein:  75-90 grams  Fluid:  1.8 L/day  EDUCATION NEEDS:   No education needs identified at this time  [place], CSP, LDN Inpatient Clinical Dietitian Pager: [phone] After Hours Pager: [phone]

## 2016-04-03 NOTE — Progress Notes (Signed)
Pharmacy Antibiotic Note  [name] is a 71 y.o. female admitted on 03/31/2016 with sepsis.  Pt has had multiple admissions and has had separate episodes of a MRSA pneumonia and Pseudomonas pneumonia in the past year. Her respiratory culture is currently growing staph aureus and pseudomonas.    Plan: Vancomycin 500 mg IV q24h Cefepime 2 g IV q12h Monitor renal fx, cultures and susceptibilities, obtain VT as needed   Height: 4\' 10"  (147.3 cm) Weight: 95 lb 10.9 oz (43.4 kg) IBW/kg (Calculated) : 40.9  Temp (24hrs), Avg:98.2 F (36.8 C), Min:97.3 F (36.3 C), Max:98.7 F (37.1 C)   Recent Labs Lab 03/31/16 1930 03/31/16 1956 03/31/16 2156 04/01/16 0236 04/01/16 0239 04/01/16 1622 04/02/16 0227 04/03/16 0415 04/03/16 1140  WBC 21.8*  --   --   --  12.3*  --  7.7 6.6 10.2  CREATININE 0.99  --   --   --  0.75 0.73 0.75 0.81  --   LATICACIDVEN  --  5.71* 1.07 0.7  --   --   --   --   --     Estimated Creatinine Clearance: 41.1 mL/min (by C-G formula based on SCr of 0.81 mg/dL).    Allergies  Allergen Reactions  . Celexa [Citalopram Hydrobromide] Other (See Comments)    "Jerky movements"  . Nickel     Unknown reaction  . Tramadol Hcl Nausea And Vomiting    Cefepime 1/14 >>  Vancomycin 1/14 >>  Levaquin 1/15 >> 1/17   1/15 BCx: ngtd 1.15 UCx: ngtd 1/15 resp cx: staph, pseudomonas 1/16 resp cx: pseudomonas 1/15 MRSA PCR: positive   Hughes Better, PharmD, BCPS Clinical Pharmacist 04/03/2016 1:18 PM

## 2016-04-03 NOTE — Progress Notes (Signed)
PULMONARY / CRITICAL CARE MEDICINE   Name: [name] MRN: [id number] DOB: 08-Feb-1946    ADMISSION DATE:  03/31/2016 CONSULTATION DATE:  1/16  REFERRING MD:  Dr. Maryland Pink  CHIEF COMPLAINT:  AMS  HISTORY OF PRESENT ILLNESS:  71 year old female with past medical history as below, which is significant for COPD on home O2, diastolic CHF, atrial fibrillation on full dose aspirin, protein calorie malnutrition, and recent admission (12/2015) for MRSA pneumonia requiring mechanical ventilation. During that admission she was dilated by palliative care medicine and it was deemed that she would remain a full code with aggressive measures. Family reports that since her recent admissions she has never fully regained her strength and has been limited in her function at home, and has had a very poor appetite. These findings progressively worsened in the days leading to her presentation to [facility] 1/14 accompanied with complaints of worsening chronic cough and thick yellow sputum production.   Upon arrival to the emergency department she was afebrile requiring supplemental oxygen to maintain sats above 90%. Chest x-ray concerning for left upper lobe infiltrate. She was admitted to the hospitalist service for treatment of HCAP with broad-spectrum antibiotics and questionable COPD exacerbation. Hospital course complicated by progressive lethargy, which progressed to near unresponsiveness on the morning of 1/16. ABG at that time significant for pH 7.16. PCCM was consulted for further valuation.  SUBJECTIVE:  No change overnight.  Hemodynamically stable.  Wakes easily on precedex, follows commands intermittently.   VITAL SIGNS: BP 136/80   Pulse (!) 102   Temp 98.6 F (37 C) (Oral)   Resp (!) 26   Ht 4\' 10"  (1.473 m)   Wt 43.4 kg (95 lb 10.9 oz)   SpO2 100%   BMI 20.00 kg/m   HEMODYNAMICS:    VENTILATOR SETTINGS: Vent Mode: PRVC FiO2 (%):  [30 %-40 %] 30 % Set Rate:  [30 bmp] 30 bmp Vt Set:   [320 mL] 320 mL PEEP:  [5 cmH20] 5 cmH20 Plateau Pressure:  [24 [id number] cmH20] 26 cmH20  INTAKE / OUTPUT: I/O last 3 completed shifts: In: 2955.6 [I.V.:1450.6; Blood:30; IV Piggyback:1475] Out: [id number] [Urine:775]  PHYSICAL EXAMINATION: General:  Frail, chronically ill appearing female, NAD  Neuro:  Wakes to name, follows some commands, gen weakness  HEENT:  [place]/AT, PERRL, no JVD Cardiovascular:  s1s2 rrr   Lungs:  resps even non labored on vent, scattered rhonchi, few exp wheeze  Abdomen:  Soft, non-tender, non-distended Musculoskeletal:  No acute deformity Skin:  Wounds to bilateral lower extremities (L anterior tibial, R dorsal foot) Both well circumscribed, shallow, without s/s infection.   LABS:  BMET  Recent Labs Lab 04/01/16 1622 04/02/16 0227 04/03/16 0415  NA 144 143 145  K 3.6 4.2 3.2*  CL 100* 102 106  CO2 38* 37* 33*  BUN 6 7 11   CREATININE 0.73 0.75 0.81  GLUCOSE 195* 139* 149*    Electrolytes  Recent Labs Lab 04/01/16 0955 04/01/16 1622 04/02/16 0227 04/03/16 0415  CALCIUM  --  7.3* 7.4* 7.8*  MG 1.8  --  1.7 1.5*  PHOS  --   --   --  <1.0*    CBC  Recent Labs Lab 04/01/16 0239 04/02/16 0227 04/03/16 0415 04/03/16 0547  WBC 12.3* 7.7 6.6  --   HGB 7.6* 7.6* 6.2* 6.2*  HCT 26.6* 27.4* 22.1* 22.2*  PLT 236 238 164  --     Coag's  Recent Labs Lab 04/01/16 0725  APTT 32  INR  1.26    Sepsis Markers  Recent Labs Lab 03/31/16 1956 03/31/16 2156 04/01/16 0236 04/01/16 0725 04/02/16 1320 04/03/16 0415  LATICACIDVEN 5.71* 1.07 0.7  --   --   --   PROCALCITON  --   --   --  0.30 0.24 0.18    ABG  Recent Labs Lab 03/31/16 1956 04/02/16 0854 04/02/16 1225  PHART 7.471* 7.145* 7.351  PCO2ART 80.1* ABOVE REPORTABLE RANGE 63.8*  PO2ART 79* 88.3 199*    Liver Enzymes  Recent Labs Lab 03/31/16 1930 04/01/16 0239 04/03/16 0415  AST 26 13*  --   ALT 12* 10*  --   ALKPHOS 101 69  --   BILITOT 1.1 0.9  --   ALBUMIN 2.0*  1.5* 1.4*    Cardiac Enzymes No results for input(s): TROPONINI, PROBNP in the last 168 hours.  Glucose  Recent Labs Lab 04/02/16 1143 04/02/16 1527 04/02/16 2012 04/03/16 0028 04/03/16 0400 04/03/16 0739  GLUCAP 123* 157*  157* 147* 138* 133* 107*    Imaging Portable Chest Xray  Result Date: 04/03/2016 CLINICAL DATA:  Respiratory failure with hypercapnia EXAM: PORTABLE CHEST 1 VIEW COMPARISON:  April 02, 2016 chest radiograph ; chest CT January 01, 2016 FINDINGS: Endotracheal tube tip is at the carina. Central catheter tip is in the superior vena cava. Nasogastric tubes tip and side port are below the diaphragm. No pneumothorax. Fibrosis in the upper lobe regions bilaterally remain. Fibrosis in the bases also is a stable finding. There are small pleural effusions bilaterally. There is chronic scarring along the lateral right base inferiorly. No new opacity. Heart size normal. Pulmonary vascularity is distorted by the upper lobe cicatrization, unchanged. No adenopathy evident. There is aortic atherosclerosis. IMPRESSION: Extensive lung fibrosis with areas of cicatrization, greatest in the upper lobes, stable. Small pleural effusions bilaterally. No new opacity. Stable cardiac silhouette. There is aortic atherosclerosis. Tube And catheter positions as described. Note that the endotracheal tube tip is at the carina. Endotracheal tube should be withdrawn 2-3 cm. These results will be called to the ordering clinician or representative by the Radiologist Assistant, and communication documented in the PACS or zVision Dashboard. Electronically Signed   By: Lowella Grip III M.D.   On: 04/03/2016 07:33   Dg Chest Port 1 View  Result Date: 04/02/2016 CLINICAL DATA:  Intubation. EXAM: PORTABLE CHEST 1 VIEW COMPARISON:  03/31/2016. FINDINGS: Endotracheal tube, NG tube, left IJ line in good anatomic position. Biapical and bibasilar pleuroparenchymal thickening consistent with scarring. Heart  size normal. IMPRESSION: 1. Lines and tubes in good anatomic position. 2. Biapical and bibasilar pleuroparenchymal thickening noted consistent with scarring. Electronically Signed   By: Marcello Moores  Register   On: 04/02/2016 11:23     STUDIES:    CULTURES: BC 1/14 >>> Sputum 1/14 >>> moderate pseudomonas, mod staph aureus>>>  Urine 1/14 >>>  ANTIBIOTICS: Cefepime 1/14>>> Levaquin 1/14>>> Vanco 1/14 >>>  SIGNIFICANT EVENTS: 12/2015 admit for MRS PNA on vent  LINES/TUBES: ETT 1/16>>> L IJ CVL 1/16>>>   ASSESSMENT / PLAN:  71 year old female with severe COPD on home O2 with prolonged admission 12/2015 for MRSA PNA requiring vent.  Since then has been frail, weak, poor functional status at home per family.  Again aditted 1/14 by [facility] with HCAP and AECOPD.  Had worsening respiratory and mental status, failed bipap and tx ICU for intubation.     ASSESSMENT / PLAN:  PULMONARY Acute on chronic hypercarbic respiratory failure - r/t AECOPD, HCAP.  Failed bipap requiring emergent  intubation 1/16.  HCAP - staph, pseudomonas. Recent MRSA PNA AECOPD  PLAN -  Vent support - 8cc/kg  Decrease RR 22 F/u CXR  F/u ABG  IV steroids  duonebs q4h  Daily SBT  Broad spectrum abx - levaquin, cefepime, vanc  Trend pct  Await sensitivities   CARDIOVASCULAR Sepsis secondary to HCAP - resolved.  Chronic dCHF  PLAN -  Lasix 40mg  IV x 1 1/17 Trend pct  Follow cultures   RENAL Hypokalemia  Hypophosphatemia  P:   Replete K  F/u chem   GASTROINTESTINAL Protein calorie malnutrition  PLAN -  Start TF 1/17 PPI  Will need speech once extubated prior to PO diet   HEMATOLOGIC Anemia - likely chronic. No obvious s/s bleeding.  PLAN -  PRBC x 1 1/17 guaiac stools  lovenox   INFECTIOUS HCAP - staph, pseudomonas. Recent MRSA P:   Abx as above  Follow cultures - await sensitivities  De-escalate as able  Trend Pct   ENDOCRINE Hyperglycemia - mild. Likely steroid related. No hx DM   P:   Monitor glucose on chem  Add SSI if consistently >180   NEUROLOGIC AMS - r/t hypercarbia. Improving.  Sedation needs on vent  P:   RASS goal: -1 precedex  Daily WUA    ORTHO:  Bilateral leg wounds  - nonhealing ulcerations, recent imaging without cause for concern. Fracture of the right greater trochanter  PLAN -  - Family denied orthopedics consult  Wound care following    Nickolas Madrid, NP 04/03/2016  11:12 AM Pager: [phone] or [phone]  STAFF NOTE: I, Merrie Roof, MD FACP have personally reviewed patient's available data, including medical history, events of note, physical examination and test results as part of my evaluation. I have discussed with resident/NP and other care providers such as pharmacist, RN and RRT. In addition, I personally evaluated patient and elicited key findings of: awakens, lungs ronchi, coarse, low muscle mass, pcxr and prior CT reviewed, extensive emphysema and new upper lobe infiltrates, abg reviewed, would avoid high rates with autopeep risk as able, SBT planned, maintain vanc, cef, doubt role levofloxacin, follow sens pattern, keep steroids for bronchospasm, start TF, keep even balance, for 1 unit prbc, likely dilutional in setting chronic anemia, no bleeding noted, weaning attempts, concern for outcome The patient is critically ill with multiple organ systems failure and requires high complexity decision making for assessment and support, frequent evaluation and titration of therapies, application of advanced monitoring technologies and extensive interpretation of multiple databases.   Critical Care Time devoted to patient care services described in this note is 35 Minutes. This time reflects time of care of this signee: Merrie Roof, MD FACP. This critical care time does not reflect procedure time, or teaching time or supervisory time of PA/NP/Med student/Med Resident etc but could involve care discussion time. Rest per  NP/medical resident whose note is outlined above and that I agree with   Lavon Paganini. Titus Mould, MD, [place] Pgr: [place] [facility] 04/03/2016 11:19 AM

## 2016-04-03 NOTE — Procedures (Signed)
PS trial attempted at this time, pt became extremely agitated, sitting up in bed, obvious increased WOB, VT's 150, RR >35, pt placed back on full support at this time, RN aware.

## 2016-04-04 ENCOUNTER — Inpatient Hospital Stay (HOSPITAL_COMMUNITY): Payer: Medicare Other

## 2016-04-04 DIAGNOSIS — Z01818 Encounter for other preprocedural examination: Secondary | ICD-10-CM

## 2016-04-04 DIAGNOSIS — J9611 Chronic respiratory failure with hypoxia: Secondary | ICD-10-CM

## 2016-04-04 DIAGNOSIS — J9612 Chronic respiratory failure with hypercapnia: Secondary | ICD-10-CM

## 2016-04-04 LAB — TYPE AND SCREEN
ABO/RH(D): A NEG
ANTIBODY SCREEN: NEGATIVE
UNIT DIVISION: 0

## 2016-04-04 LAB — RENAL FUNCTION PANEL
Albumin: 1.6 g/dL — ABNORMAL LOW (ref 3.5–5.0)
Anion gap: 11 (ref 5–15)
BUN: 19 mg/dL (ref 6–20)
CHLORIDE: 100 mmol/L — AB (ref 101–111)
CO2: 35 mmol/L — ABNORMAL HIGH (ref 22–32)
Calcium: 7.5 mg/dL — ABNORMAL LOW (ref 8.9–10.3)
Creatinine, Ser: 0.91 mg/dL (ref 0.44–1.00)
GFR calc Af Amer: >60 mL/min (ref >60)
Glucose, Bld: 239 mg/dL — ABNORMAL HIGH (ref 65–99)
POTASSIUM: 3 mmol/L — AB (ref 3.5–5.1)
Phosphorus: 9.1 mg/dL — ABNORMAL HIGH (ref 2.5–4.6)
Sodium: 146 mmol/L — ABNORMAL HIGH (ref 135–145)

## 2016-04-04 LAB — GLUCOSE, CAPILLARY
GLUCOSE-CAPILLARY: 172 mg/dL — AB (ref 65–99)
GLUCOSE-CAPILLARY: 247 mg/dL — AB (ref 65–99)
GLUCOSE-CAPILLARY: 213 mg/dL — AB (ref 65–99)
Glucose-Capillary: 155 mg/dL — ABNORMAL HIGH (ref 65–99)
Glucose-Capillary: 207 mg/dL — ABNORMAL HIGH (ref 65–99)
Glucose-Capillary: 213 mg/dL — ABNORMAL HIGH (ref 65–99)
Glucose-Capillary: 252 mg/dL — ABNORMAL HIGH (ref 65–99)

## 2016-04-04 LAB — CBC WITH DIFFERENTIAL/PLATELET
BASOS ABS: 0 10*3/uL (ref 0.0–0.1)
Basophils Relative: 0 %
Eosinophils Absolute: 0 10*3/uL (ref 0.0–0.7)
Eosinophils Relative: 0 %
HEMATOCRIT: 28.2 % — AB (ref 36.0–46.0)
HEMOGLOBIN: 8.8 g/dL — AB (ref 12.0–15.0)
LYMPHS PCT: 4 %
Lymphs Abs: 0.4 10*3/uL — ABNORMAL LOW (ref 0.7–4.0)
MCH: 28.1 pg (ref 26.0–34.0)
MCHC: 31.2 g/dL (ref 30.0–36.0)
MCV: 90.1 fL (ref 78.0–100.0)
Monocytes Absolute: 0.4 10*3/uL (ref 0.1–1.0)
Monocytes Relative: 4 %
NEUTROS ABS: 9.3 10*3/uL — AB (ref 1.7–7.7)
NEUTROS PCT: 92 %
Platelets: 134 10*3/uL — ABNORMAL LOW (ref 150–400)
RBC: 3.13 MIL/uL — AB (ref 3.87–5.11)
RDW: 18.8 % — ABNORMAL HIGH (ref 11.5–15.5)
WBC: 10.1 10*3/uL (ref 4.0–10.5)

## 2016-04-04 LAB — CULTURE, RESPIRATORY W GRAM STAIN: Special Requests: NORMAL

## 2016-04-04 LAB — MAGNESIUM
MAGNESIUM: 1.5 mg/dL — AB (ref 1.7–2.4)
Magnesium: 2.2 mg/dL (ref 1.7–2.4)

## 2016-04-04 LAB — CULTURE, RESPIRATORY

## 2016-04-04 LAB — VANCOMYCIN, TROUGH: Vancomycin Tr: 11 ug/mL — ABNORMAL LOW (ref 15–20)

## 2016-04-04 LAB — PHOSPHORUS
PHOSPHORUS: 1.7 mg/dL — AB (ref 2.5–4.6)
Phosphorus: 2.9 mg/dL (ref 2.5–4.6)

## 2016-04-04 LAB — PROCALCITONIN: Procalcitonin: 0.13 ng/mL

## 2016-04-04 MED ORDER — SODIUM PHOSPHATES 45 MMOLE/15ML IV SOLN
10.0000 mmol | Freq: Once | INTRAVENOUS | Status: AC
  Administered 2016-04-04: 10 mmol via INTRAVENOUS
  Filled 2016-04-04: qty 3.33

## 2016-04-04 MED ORDER — CIPROFLOXACIN IN D5W 400 MG/200ML IV SOLN
400.0000 mg | Freq: Two times a day (BID) | INTRAVENOUS | Status: DC
  Administered 2016-04-04 – 2016-04-10 (×12): 400 mg via INTRAVENOUS
  Filled 2016-04-04 (×14): qty 200

## 2016-04-04 MED ORDER — MAGNESIUM SULFATE 4 GM/100ML IV SOLN
4.0000 g | Freq: Once | INTRAVENOUS | Status: AC
  Administered 2016-04-04: 4 g via INTRAVENOUS
  Filled 2016-04-04: qty 100

## 2016-04-04 MED ORDER — METOPROLOL TARTRATE 25 MG/10 ML ORAL SUSPENSION
25.0000 mg | Freq: Two times a day (BID) | ORAL | Status: DC
  Administered 2016-04-04 – 2016-04-08 (×10): 25 mg
  Filled 2016-04-04 (×11): qty 10

## 2016-04-04 MED ORDER — SODIUM CHLORIDE 0.9 % IV SOLN
30.0000 meq | Freq: Once | INTRAVENOUS | Status: AC
  Administered 2016-04-04: 30 meq via INTRAVENOUS
  Filled 2016-04-04: qty 15

## 2016-04-04 MED ORDER — DEXMEDETOMIDINE HCL IN NACL 400 MCG/100ML IV SOLN
0.4000 ug/kg/h | INTRAVENOUS | Status: DC
  Administered 2016-04-04: 1 ug/kg/h via INTRAVENOUS
  Administered 2016-04-05: 1.8 ug/kg/h via INTRAVENOUS
  Administered 2016-04-05: 1.2 ug/kg/h via INTRAVENOUS
  Administered 2016-04-05: 1.7 ug/kg/h via INTRAVENOUS
  Administered 2016-04-05: 1.2 ug/kg/h via INTRAVENOUS
  Administered 2016-04-06: 1.5 ug/kg/h via INTRAVENOUS
  Administered 2016-04-06: 1.6 ug/kg/h via INTRAVENOUS
  Administered 2016-04-06 (×2): 1.2 ug/kg/h via INTRAVENOUS
  Administered 2016-04-07 (×2): 1.6 ug/kg/h via INTRAVENOUS
  Administered 2016-04-07 – 2016-04-08 (×2): 1.4 ug/kg/h via INTRAVENOUS
  Filled 2016-04-04 (×16): qty 100

## 2016-04-04 MED ORDER — SODIUM PHOSPHATES 45 MMOLE/15ML IV SOLN
30.0000 mmol | Freq: Once | INTRAVENOUS | Status: AC
  Administered 2016-04-04: 30 mmol via INTRAVENOUS
  Filled 2016-04-04: qty 10

## 2016-04-04 MED ORDER — POTASSIUM CHLORIDE 20 MEQ/15ML (10%) PO SOLN
20.0000 meq | Freq: Once | ORAL | Status: AC
  Administered 2016-04-04: 20 meq
  Filled 2016-04-04: qty 15

## 2016-04-04 MED ORDER — VANCOMYCIN HCL IN DEXTROSE 750-5 MG/150ML-% IV SOLN
750.0000 mg | INTRAVENOUS | Status: DC
  Administered 2016-04-04 – 2016-04-08 (×5): 750 mg via INTRAVENOUS
  Filled 2016-04-04 (×5): qty 150

## 2016-04-04 NOTE — Progress Notes (Signed)
Pharmacy Antibiotic Note   71 y.o. female admitted on 03/31/2016 with sepsis.  Pt has had multiple admissions and has had separate episodes of a MRSA pneumonia and Pseudomonas pneumonia in the past year. Her respiratory culture is currently growing MRSA and pseudomonas.  Vancomycin trough steady state = 11 mcg/ml on Vancomycin 500 mg IV q24h  Goal vanc trough = 15-20 mcg/ml for PNA.   Plan: Increase Vancomycin to 750 mg IV q24h Monitor renal fx, cultures and susceptibilities, obtain VT as needed   Height: 4\' 10"  (147.3 cm) Weight: 95 lb 3.8 oz (43.2 kg) IBW/kg (Calculated) : 40.9  Temp (24hrs), Avg:99.7 F (37.6 C), Min:98.6 F (37 C), Max:101.6 F (38.7 C)   Recent Labs Lab 03/31/16 1956 03/31/16 2156 04/01/16 0236 04/01/16 0239 04/01/16 1622 04/02/16 0227 04/03/16 0415 04/03/16 1140 04/04/16 0505 04/04/16 1921  WBC  --   --   --  12.3*  --  7.7 6.6 10.2 10.1  --   CREATININE  --   --   --  0.75 0.73 0.75 0.81  --  0.91  --   LATICACIDVEN 5.71* 1.07 0.7  --   --   --   --   --   --   --   VANCOTROUGH  --   --   --   --   --   --   --   --   --  11*    Estimated Creatinine Clearance: 36.6 mL/min (by C-G formula based on SCr of 0.91 mg/dL).    Allergies  Allergen Reactions  . Celexa [Citalopram Hydrobromide] Other (See Comments)    "Jerky movements"  . Nickel     Unknown reaction  . Tramadol Hcl Nausea And Vomiting    Cefepime 1/14 >>  Vancomycin 1/14 >>  Levaquin 1/15 >> 1/17   1/15 BCx: ngtd 1.15 UCx: neg 1/15 resp cx: MRSA, pseudomonas 1/16 resp cx: pseudomonas 1/15 MRSA PCR: positive   Nicole Cella, RPh Clinical Pharmacist Pager: [phone] 04/04/2016 8:04 PM

## 2016-04-04 NOTE — Progress Notes (Signed)
PULMONARY / CRITICAL CARE MEDICINE   Name: [name] MRN: [id number] DOB: December 28, 1945    ADMISSION DATE:  03/31/2016 CONSULTATION DATE:  1/16  REFERRING MD:  Dr. Maryland Pink  CHIEF COMPLAINT:  AMS  HISTORY OF PRESENT ILLNESS:  71 year old female with past medical history as below, which is significant for COPD on home O2, diastolic CHF, atrial fibrillation on full dose aspirin, protein calorie malnutrition, and recent admission (12/2015) for MRSA pneumonia requiring mechanical ventilation. During that admission she was dilated by palliative care medicine and it was deemed that she would remain a full code with aggressive measures. Family reports that since her recent admissions she has never fully regained her strength and has been limited in her function at home, and has had a very poor appetite. These findings progressively worsened in the days leading to her presentation to [facility] 1/14 accompanied with complaints of worsening chronic cough and thick yellow sputum production.   Upon arrival to the emergency department she was afebrile requiring supplemental oxygen to maintain sats above 90%. Chest x-ray concerning for left upper lobe infiltrate. She was admitted to the hospitalist service for treatment of HCAP with broad-spectrum antibiotics and questionable COPD exacerbation. Hospital course complicated by progressive lethargy, which progressed to near unresponsiveness on the morning of 1/16. ABG at that time significant for pH 7.16. PCCM was consulted for further valuation.  SUBJECTIVE:  Failed SBT this am with Vt 100 on PS 12/5.  More sedate this am. Tachy.  Febrile.   VITAL SIGNS: BP (!) 157/88   Pulse (!) 124   Temp (!) 101.1 F (38.4 C) (Oral)   Resp (!) 26   Ht 4\' 10"  (1.473 m)   Wt 43.2 kg (95 lb 3.8 oz)   SpO2 100%   BMI 19.90 kg/m   HEMODYNAMICS:    VENTILATOR SETTINGS: Vent Mode: PRVC FiO2 (%):  [30 %] 30 % Set Rate:  [22 bmp] 22 bmp Vt Set:  [320 mL] 320  mL PEEP:  [5 cmH20] 5 cmH20 Plateau Pressure:  [21 cmH20-29 cmH20] 29 cmH20  INTAKE / OUTPUT: I/O last 3 completed shifts: In: 2904 [I.V.:529.4; Blood:368; NG/GT:731.7; IV Piggyback:1275] Out: 2420 [Urine:2420]  PHYSICAL EXAMINATION: General:  Frail, chronically ill appearing female, NAD  Neuro:  Sedated on vent, RASS -2, gen weakness  HEENT:  [place]/AT, PERRL, no JVD Cardiovascular:  s1s2 rrr   Lungs:  resps even non labored on vent, diminished throughout  Abdomen:  Soft, non-tender, non-distended Musculoskeletal:  No acute deformity Skin:  Multiple BLE wounds    LABS:  BMET  Recent Labs Lab 04/02/16 0227 04/03/16 0415 04/04/16 0505  NA 143 145 146*  K 4.2 3.2* 3.0*  CL 102 106 100*  CO2 37* 33* 35*  BUN 7 11 19   CREATININE 0.75 0.81 0.91  GLUCOSE 139* 149* 239*    Electrolytes  Recent Labs Lab 04/02/16 0227 04/03/16 0415  04/03/16 1637 04/03/16 2023 04/04/16 0505  CALCIUM 7.4* 7.8*  --   --   --  7.5*  MG 1.7 1.5*  < > 3.0* 2.5* 2.2  PHOS  --  <1.0*  < > 2.5 2.0* 2.9  9.1*  < > = values in this interval not displayed.  CBC  Recent Labs Lab 04/03/16 0415 04/03/16 0547 04/03/16 1140 04/04/16 0505  WBC 6.6  --  10.2 10.1  HGB 6.2* 6.2* 8.4* 8.8*  HCT 22.1* 22.2* 27.6* 28.2*  PLT 164  --  154 134*    Coag's  Recent  Labs Lab 04/01/16 0725  APTT 32  INR 1.26    Sepsis Markers  Recent Labs Lab 03/31/16 1956 03/31/16 2156 04/01/16 0236  04/02/16 1320 04/03/16 0415 04/04/16 0505  LATICACIDVEN 5.71* 1.07 0.7  --   --   --   --   PROCALCITON  --   --   --   < > 0.24 0.18 0.13  < > = values in this interval not displayed.  ABG  Recent Labs Lab 04/02/16 0854 04/02/16 1225 04/03/16 1215  PHART 7.145* 7.351 7.468*  PCO2ART ABOVE REPORTABLE RANGE 63.8* 45.4  PO2ART 88.3 199* 64.0*    Liver Enzymes  Recent Labs Lab 03/31/16 1930 04/01/16 0239 04/03/16 0415 04/04/16 0505  AST 26 13*  --   --   ALT 12* 10*  --   --   ALKPHOS  101 69  --   --   BILITOT 1.1 0.9  --   --   ALBUMIN 2.0* 1.5* 1.4* 1.6*    Cardiac Enzymes No results for input(s): TROPONINI, PROBNP in the last 168 hours.  Glucose  Recent Labs Lab 04/03/16 0739 04/03/16 1135 04/03/16 1542 04/03/16 2008 04/04/16 0012 04/04/16 0358  GLUCAP 107* 131* 131* 232* 213* 172*    Imaging Dg Chest Portable 1 View  Result Date: 04/04/2016 CLINICAL DATA:  Healthcare associated pneumonia EXAM: PORTABLE CHEST 1 VIEW COMPARISON:  04/03/2016 FINDINGS: Endotracheal tube just above the carina. Central venous catheter tip in the SVC. NG tube in the stomach. Apical scarring bilaterally is unchanged. Bibasilar airspace disease unchanged. Small bilateral effusions unchanged. IMPRESSION: Bibasilar infiltrates unchanged, possible pneumonia. Small bilateral effusions unchanged. Endotracheal tube just above the carina. Central venous catheter tip in the SVC. Electronically Signed   By: Franchot Gallo M.D.   On: 04/04/2016 06:40     STUDIES:    CULTURES: [facility] 1/14 >>> Sputum 1/14 >>> moderate pseudomonas, mod staph aureus>>> MRSA Urine 1/14 >>>  ANTIBIOTICS: Cefepime 1/14>>> Levaquin 1/14>>>1/17 Vanco 1/14 >>>  SIGNIFICANT EVENTS: 12/2015 admit for MRS PNA on vent  LINES/TUBES: ETT 1/16>>> L IJ CVL 1/16>>>   ASSESSMENT / PLAN:  71 year old female with severe COPD on home O2 with prolonged admission 12/2015 for MRSA PNA requiring vent.  Since then has been frail, weak, poor functional status at home per family.  Again aditted 1/14 by [facility] with HCAP and AECOPD.  Had worsening respiratory and mental status, failed bipap and tx ICU for intubation.     ASSESSMENT / PLAN:  PULMONARY Acute on chronic hypercarbic respiratory failure - r/t AECOPD, HCAP.   HCAP - staph, pseudomonas. Recent MRSA PNA AECOPD  PLAN -  Vent support - 8cc/kg  Daily SBT  F/u CXR  F/u ABG  Continue IV steroids  duonebs q4h  Daily SBT  Continue broad spectrum abx as above   Trend pct  Await sensitivities   CARDIOVASCULAR Sepsis secondary to HCAP - resolved.  Chronic dCHF  AFib  PLAN -  Trend pct  Add metoprolol 25mg  per tube BID 1/18 Not chronically anticoagulated   RENAL Hypokalemia  Hypophosphatemia  P:   Replete K, phos  F/u chem   GASTROINTESTINAL Protein calorie malnutrition  PLAN -  Continue TF  PPI  Will need speech once extubated prior to PO diet   HEMATOLOGIC Anemia - likely chronic. No obvious s/s bleeding. S/p 1 unit PRBC 1/17 PLAN -  guaiac stools  lovenox  F/u CBC   INFECTIOUS HCAP - staph, pseudomonas. Recent MRSA P:   Abx as  above - vanc, cefepime  Follow cultures - await sensitivities and further de-escalate abx    ENDOCRINE Hyperglycemia - mild. Likely steroid related. No hx DM  P:   Add SSI 1/18  NEUROLOGIC AMS - r/t hypercarbia. Improving.  Sedation needs on vent  P:   RASS goal: -1 Precedex, PRN versed  Daily WUA    ORTHO:  Bilateral leg wounds  - nonhealing ulcerations, recent imaging without cause for concern. Fracture of the right greater trochanter  PLAN -  - Family denied orthopedics consult  - Wound care following    No family available 1/18.  Suspect her overall prognosis is poor.  Need to discuss goals of care with family.   Nickolas Madrid, NP 04/04/2016  8:36 AM Pager: [phone] or [phone]  STAFF NOTE: I, Merrie Roof, MD FACP have personally reviewed patient's available data, including medical history, events of note, physical examination and test results as part of my evaluation. I have discussed with resident/NP and other care providers such as pharmacist, RN and RRT. In addition, I personally evaluated patient and elicited key findings of: awakens, not fc, has fc in past day, pcxr with continued new infiltrates apical, 2 organisms mrsa and pseudomonas, will change to ciprom, continued vanc, plan 14 days both abx, both pathogens, rate to 18, weaning attempted failed,  may need early trach, need tyo discuss goals with family, repeat wean on higher PS also did poorly, precedex qith WUA, chem inam , k supp The patient is critically ill with multiple organ systems failure and requires high complexity decision making for assessment and support, frequent evaluation and titration of therapies, application of advanced monitoring technologies and extensive interpretation of multiple databases.   Critical Care Time devoted to patient care services described in this note is 30 Minutes. This time reflects time of care of this signee: Merrie Roof, MD FACP. This critical care time does not reflect procedure time, or teaching time or supervisory time of PA/NP/Med student/Med Resident etc but could involve care discussion time. Rest per NP/medical resident whose note is outlined above and that I agree with   Lavon Paganini. Titus Mould, MD, [place] Pgr: [place] [facility] 04/04/2016 12:17 PM

## 2016-04-04 NOTE — Progress Notes (Signed)
[facility] Progress Note Patient Name: [name] DOB: 12/31/1945 MRN: [id number]   Date of Service  04/04/2016  HPI/Events of Note  Magnesium and phos both low  eICU Interventions  Replace both     Intervention Category Intermediate Interventions: Electrolyte abnormality - evaluation and management  Simonne Maffucci 04/04/2016, 5:17 PM

## 2016-04-05 ENCOUNTER — Inpatient Hospital Stay (HOSPITAL_COMMUNITY): Payer: Medicare Other

## 2016-04-05 DIAGNOSIS — A4102 Sepsis due to Methicillin resistant Staphylococcus aureus: Secondary | ICD-10-CM

## 2016-04-05 LAB — CBC WITH DIFFERENTIAL/PLATELET
Basophils Absolute: 0 10*3/uL (ref 0.0–0.1)
Basophils Relative: 0 %
EOS PCT: 0 %
Eosinophils Absolute: 0 10*3/uL (ref 0.0–0.7)
HCT: 27.7 % — ABNORMAL LOW (ref 36.0–46.0)
Hemoglobin: 8.7 g/dL — ABNORMAL LOW (ref 12.0–15.0)
LYMPHS ABS: 0.6 10*3/uL — AB (ref 0.7–4.0)
LYMPHS PCT: 5 %
MCH: 28 pg (ref 26.0–34.0)
MCHC: 31.4 g/dL (ref 30.0–36.0)
MCV: 89.1 fL (ref 78.0–100.0)
MONO ABS: 0.3 10*3/uL (ref 0.1–1.0)
Monocytes Relative: 3 %
Neutro Abs: 10.5 10*3/uL — ABNORMAL HIGH (ref 1.7–7.7)
Neutrophils Relative %: 92 %
PLATELETS: 117 10*3/uL — AB (ref 150–400)
RBC: 3.11 MIL/uL — AB (ref 3.87–5.11)
RDW: 17.6 % — AB (ref 11.5–15.5)
WBC: 11.4 10*3/uL — ABNORMAL HIGH (ref 4.0–10.5)

## 2016-04-05 LAB — RENAL FUNCTION PANEL
Albumin: 1.7 g/dL — ABNORMAL LOW (ref 3.5–5.0)
Anion gap: 10 (ref 5–15)
BUN: 26 mg/dL — AB (ref 6–20)
CHLORIDE: 95 mmol/L — AB (ref 101–111)
CO2: 35 mmol/L — AB (ref 22–32)
CREATININE: 0.82 mg/dL (ref 0.44–1.00)
Calcium: 7.7 mg/dL — ABNORMAL LOW (ref 8.9–10.3)
GFR calc Af Amer: >60 mL/min (ref >60)
GFR calc non Af Amer: >60 mL/min (ref >60)
Glucose, Bld: 181 mg/dL — ABNORMAL HIGH (ref 65–99)
Phosphorus: 1.6 mg/dL — ABNORMAL LOW (ref 2.5–4.6)
Potassium: 2.7 mmol/L — CL (ref 3.5–5.1)
Sodium: 140 mmol/L (ref 135–145)

## 2016-04-05 LAB — BASIC METABOLIC PANEL
Anion gap: 6 (ref 5–15)
BUN: 26 mg/dL — AB (ref 6–20)
CALCIUM: 7.6 mg/dL — AB (ref 8.9–10.3)
CHLORIDE: 98 mmol/L — AB (ref 101–111)
CO2: 34 mmol/L — AB (ref 22–32)
CREATININE: 0.75 mg/dL (ref 0.44–1.00)
GFR calc Af Amer: >60 mL/min (ref >60)
GFR calc non Af Amer: >60 mL/min (ref >60)
Glucose, Bld: 115 mg/dL — ABNORMAL HIGH (ref 65–99)
Potassium: 3.9 mmol/L (ref 3.5–5.1)
SODIUM: 138 mmol/L (ref 135–145)

## 2016-04-05 LAB — GLUCOSE, CAPILLARY
GLUCOSE-CAPILLARY: 107 mg/dL — AB (ref 65–99)
GLUCOSE-CAPILLARY: 175 mg/dL — AB (ref 65–99)
Glucose-Capillary: 176 mg/dL — ABNORMAL HIGH (ref 65–99)
Glucose-Capillary: 163 mg/dL — ABNORMAL HIGH (ref 65–99)
Glucose-Capillary: 154 mg/dL — ABNORMAL HIGH (ref 65–99)
Glucose-Capillary: 171 mg/dL — ABNORMAL HIGH (ref 65–99)

## 2016-04-05 LAB — CULTURE, BLOOD (ROUTINE X 2)
Culture: NO GROWTH
Culture: NO GROWTH

## 2016-04-05 LAB — MAGNESIUM: Magnesium: 2.7 mg/dL — ABNORMAL HIGH (ref 1.7–2.4)

## 2016-04-05 LAB — PHOSPHORUS: Phosphorus: 2.2 mg/dL — ABNORMAL LOW (ref 2.5–4.6)

## 2016-04-05 MED ORDER — POLYETHYLENE GLYCOL 3350 17 G PO PACK: 17.0000 g | PACK | Freq: Every day | ORAL | Status: DC | PRN

## 2016-04-05 MED ORDER — METHYLPREDNISOLONE SODIUM SUCC 125 MG IJ SOLR
60.0000 mg | Freq: Two times a day (BID) | INTRAMUSCULAR | Status: DC
  Administered 2016-04-05 – 2016-04-06 (×3): 60 mg via INTRAVENOUS
  Filled 2016-04-05 (×4): qty 0.96

## 2016-04-05 MED ORDER — MAGNESIUM SULFATE 2 GM/50ML IV SOLN: 2.0000 g | Freq: Once | INTRAVENOUS | Status: DC

## 2016-04-05 MED ORDER — DEXTROSE 5 % IV SOLN
30.0000 mmol | Freq: Once | INTRAVENOUS | Status: AC
  Administered 2016-04-05: 30 mmol via INTRAVENOUS
  Filled 2016-04-05: qty 10

## 2016-04-05 MED ORDER — SODIUM PHOSPHATES 45 MMOLE/15ML IV SOLN
20.0000 mmol | Freq: Once | INTRAVENOUS | Status: AC
  Administered 2016-04-05: 20 mmol via INTRAVENOUS
  Filled 2016-04-05: qty 6.67

## 2016-04-05 MED ORDER — SODIUM CHLORIDE 0.9 % IV SOLN
30.0000 meq | INTRAVENOUS | Status: AC
  Administered 2016-04-05 (×3): 30 meq via INTRAVENOUS
  Filled 2016-04-05 (×5): qty 15

## 2016-04-05 NOTE — Progress Notes (Signed)
Inpatient Diabetes Program Recommendations  AACE/ADA: New Consensus Statement on Inpatient Glycemic Control (2015)  Target Ranges:  Prepandial:   less than 140 mg/dL      Peak postprandial:   less than 180 mg/dL (1-2 hours)      Critically ill patients:  140 - 180 mg/dL   Lab Results  Component Value Date   GLUCAP 163 (H) 04/05/2016   HGBA1C 5.1 04/02/2016    Review of Glycemic Control:  Results for [name], [name] (MRN [id number]) as of 04/05/2016 09:09  Ref. Range 04/04/2016 15:44 04/04/2016 20:00 04/04/2016 23:54 04/05/2016 03:43 04/05/2016 07:41  Glucose-Capillary Latest Ref Range: 65 - 99 mg/dL 247 (H) 213 (H) 252 (H) 176 (H) 163 (H)    Diabetes history: None  Current orders for Inpatient glycemic control:  Novolog sensitive q 4 hours, Solumedrol 60 mg IV q 6 hours  Inpatient Diabetes Program Recommendations:    Please consider ICU glycemic control order set-2-4-6 q 4 hours.   Thanks, Maureen Perl, RN, BC-ADM Inpatient Diabetes Coordinator Pager [phone] (8a-5p)

## 2016-04-05 NOTE — Progress Notes (Signed)
I updated the patient's husband, Mr. Jaide Deveaux, on the patient's clinical status. We discussed that she has not been doing well trying to come off of the ventilator machine. He states he knows she would want to be on the ventilator machine only if she had a chance to recover. We discussed that she has poor lung disease at baseline and currently has a complicated pneumonia with both MRSA and pseudomonas organisms growing. I recommended that if we were able to get her off the ventilator and she were to do poorly that we should not reintubate her. Husband agrees that he does not want her to suffer. He would like to discuss the patient's code status more with his son and daughter before making a final decision. He will be at the hospital on Saturday or Sunday and would like to discuss the issue more at that time. I let him know I will be here on Sunday.   Albin Felling, MD, MPH Internal Medicine Resident, PGY-III Pager: [phone]

## 2016-04-05 NOTE — Progress Notes (Signed)
PULMONARY / CRITICAL CARE MEDICINE   Name: [name] MRN: [id number] DOB: [birth year]    ADMISSION DATE:  03/31/2016 CONSULTATION DATE:  1/16  REFERRING MD:  Dr. [place] Pink  CHIEF COMPLAINT:  AMS  HISTORY OF PRESENT ILLNESS:  71 year old female with past medical history as below, which is significant for COPD on home O2, diastolic CHF, atrial fibrillation on full dose aspirin, protein calorie malnutrition, and recent admission (12/2015) for MRSA pneumonia requiring mechanical ventilation. During that admission she was dilated by palliative care medicine and it was deemed that she would remain a full code with aggressive measures. Family reports that since her recent admissions she has never fully regained her strength and has been limited in her function at home, and has had a very poor appetite. These findings progressively worsened in the days leading to her presentation to [facility] 1/14 accompanied with complaints of worsening chronic cough and thick yellow sputum production.   Upon arrival to the emergency department she was afebrile requiring supplemental oxygen to maintain sats above 90%. Chest x-ray concerning for left upper lobe infiltrate. She was admitted to the hospitalist service for treatment of HCAP with broad-spectrum antibiotics and questionable COPD exacerbation. Hospital course complicated by progressive lethargy, which progressed to near unresponsiveness on the morning of 1/16. ABG at that time significant for pH 7.16. PCCM was consulted for further valuation.  SUBJECTIVE:  Afebrile overnight. Sedated.   VITAL SIGNS: BP 121/67   Pulse (!) 107   Temp 99.1 F (37.3 C) (Oral)   Resp (!) 24   Ht 4\' 10"  (1.473 m)   Wt 93 lb 7.6 oz (42.4 kg)   SpO2 97%   BMI 19.54 kg/m   HEMODYNAMICS:    VENTILATOR SETTINGS: Vent Mode: PRVC FiO2 (%):  [30 %] 30 % Set Rate:  [18 bmp-22 bmp] 18 bmp Vt Set:  [320 mL] 320 mL PEEP:  [5 cmH20] 5 cmH20 Plateau Pressure:  [14 cmH20-29  cmH20] 21 cmH20  INTAKE / OUTPUT: I/O last 3 completed shifts: In: 3317.7 [I.V.:498; Blood:338; NG/GT:1316.7; IV Piggyback:1165] Out: [id number] [Urine:5325]  PHYSICAL EXAMINATION: General:  Frail, chronically ill appearing woman, NAD  Neuro:  Sedated on vent, RASS -2, gen weakness  HEENT:  [place]/AT, PERRL, no JVD Cardiovascular: RRR, no m/g/r  Lungs:  resps even non labored on vent, diminished throughout  Abdomen:  BS+, soft, non-tender, non-distended Musculoskeletal:  No acute deformity Skin:  Multiple BLE wounds    LABS:  BMET  Recent Labs Lab 04/03/16 0415 04/04/16 0505 04/05/16 0355  NA 145 146* 140  K 3.2* 3.0* 2.7*  CL 106 100* 95*  CO2 33* 35* 35*  BUN 11 19 26*  CREATININE 0.81 0.91 0.82  GLUCOSE 149* 239* 181*    Electrolytes  Recent Labs Lab 04/03/16 0415  04/04/16 0505 04/04/16 1611 04/05/16 0354 04/05/16 0355  CALCIUM 7.8*  --  7.5*  --   --  7.7*  MG 1.5*  < > 2.2 1.5* 2.7*  --   PHOS <1.0*  < > 2.9  9.1* 1.7*  --  1.6*  < > = values in this interval not displayed.  CBC  Recent Labs Lab 04/03/16 1140 04/04/16 0505 04/05/16 0354  WBC 10.2 10.1 11.4*  HGB 8.4* 8.8* 8.7*  HCT 27.6* 28.2* 27.7*  PLT 154 134* 117*    Coag's  Recent Labs Lab 04/01/16 0725  APTT 32  INR 1.26    Sepsis Markers  Recent Labs Lab 03/31/16 1956 03/31/16 2156 04/01/16  0236  04/02/16 1320 04/03/16 0415 04/04/16 0505  LATICACIDVEN 5.71* 1.07 0.7  --   --   --   --   PROCALCITON  --   --   --   < > 0.24 0.18 0.13  < > = values in this interval not displayed.  ABG  Recent Labs Lab 04/02/16 0854 04/02/16 1225 04/03/16 1215  PHART 7.145* 7.351 7.468*  PCO2ART ABOVE REPORTABLE RANGE 63.8* 45.4  PO2ART 88.3 199* 64.0*    Liver Enzymes  Recent Labs Lab 03/31/16 1930 04/01/16 0239 04/03/16 0415 04/04/16 0505 04/05/16 0355  AST 26 13*  --   --   --   ALT 12* 10*  --   --   --   ALKPHOS 101 69  --   --   --   BILITOT 1.1 0.9  --   --   --    ALBUMIN 2.0* 1.5* 1.4* 1.6* 1.7*    Cardiac Enzymes No results for input(s): TROPONINI, PROBNP in the last 168 hours.  Glucose  Recent Labs Lab 04/04/16 0849 04/04/16 1133 04/04/16 1544 04/04/16 2000 04/04/16 2354 04/05/16 0343  GLUCAP 155* 207* 247* 213* 252* 176*    Imaging Dg Chest Portable 1 View  Result Date: 04/05/2016 CLINICAL DATA:  Hypoxia EXAM: PORTABLE CHEST 1 VIEW COMPARISON:  April 04, 2016 FINDINGS: Endotracheal tube tip is 1.0 cm above the carina. Central catheter tip is in the superior vena cava. Nasogastric tube tip and side port below the diaphragm. No pneumothorax. There is cicatrization with volume loss in the upper lobes bilaterally. There are pleural effusions bilaterally with bibasilar patchy consolidation. No new opacity. Heart size normal. Distortion of the pulmonary vascularity bilaterally is stable. No obvious adenopathy. No evident bone lesions. IMPRESSION: Tube and catheter positions as described. Note that the endotracheal tube is near the carina and may need to be withdrawn 2-3 cm. No pneumothorax. Stable areas of volume loss with cicatrization in the upper lobes. Small pleural effusions with patchy consolidation both lung bases, stable. Stable cardiac silhouette. Electronically Signed   By: Lowella Grip III M.D.   On: 04/05/2016 06:49     STUDIES:    CULTURES: [facility] 1/14 >>> Sputum 1/14 >>> moderate pseudomonas, mod staph aureus>>> MRSA Urine 1/14 >>> no growth Sputum 1/16>> Pseudomonas  ANTIBIOTICS: Cefepime 1/14>>>1/18 Levaquin 1/14>>>1/17 Vanco 1/14 >>>1/28 Cipro 1/18>>>1/28  SIGNIFICANT EVENTS: 12/2015 admit for MRS PNA on vent  LINES/TUBES: ETT 1/16>>> L IJ CVL 1/16>>>   ASSESSMENT / PLAN:  71 year old female with severe COPD on home O2 with prolonged admission 12/2015 for MRSA PNA requiring vent.  Since then has been frail, weak, poor functional status at home per family.  Again aditted 1/14 by [facility] with HCAP and AECOPD.   Had worsening respiratory and mental status, failed bipap and tx ICU for intubation.     ASSESSMENT / PLAN:  PULMONARY Acute on chronic hypercarbic respiratory failure - r/t AECOPD, HCAP.   HCAP - MRSA, pseudomonas. Recent MRSA PNA in Oct 2017 AECOPD  PLAN -  Vent support - 8cc/kg  Daily SBT  CXR in AM Decrease IV steroids  Duonebs q4h  Daily SBT  Continue abx as above   CARDIOVASCULAR Sepsis secondary to HCAP - resolved.  Chronic dCHF  AFib  PLAN -  Continue metoprolol 25mg  BID Not chronically anticoagulated  ASA  RENAL Hypokalemia  Hypophosphatemia  P:   Replete K, phos  bmet this afternoon  GASTROINTESTINAL Protein calorie malnutrition  PLAN -  Continue TF  PPI  Will need speech once extubated prior to PO diet   HEMATOLOGIC Anemia - likely chronic. No obvious s/s bleeding. S/p 1 unit PRBC 1/17 Thrombocytopenia- steadily dropping PLAN -  guaiac stools  lovenox , may need to hold f/u CBC   INFECTIOUS HCAP - staph, pseudomonas. Recent MRSA P:   Abx as above - vanc, cipro Trend fever and WBC count curves  ENDOCRINE Hyperglycemia - mild. Likely steroid related. No hx DM  P:   SSI  NEUROLOGIC AMS - r/t hypercarbia. Improving.  Sedation needs on vent  P:   RASS goal: -1 Precedex, PRN versed  Daily WUA   ORTHO:  Bilateral leg wounds  - nonhealing ulcerations, recent imaging without cause for concern. Fracture of the right greater trochanter  PLAN -  - Family denied orthopedics consult  - Wound care following    No family available 1/18.  Suspect her overall prognosis is poor.  Need to discuss goals of care with family.   Albin Felling, MD, MPH Internal Medicine Resident, PGY-III Pager: [phone]   STAFF NOTE: Linwood Dibbles, MD FACP have personally reviewed patient's available data, including medical history, events of note, physical examination and test results as part of my evaluation. I have discussed with resident/NP and other care  providers such as pharmacist, RN and RRT. In addition, I personally evaluated patient and elicited key findings of: more awake, follows commands int, improved lung sounds, pcxr improved, no fevers, maintain 14 day course current ABX for mrsa / pseudo, weaning aggressive cpap 5 ps5, goal 2 hours, need to have DNI discussion pre extubation, reduce steroids re assess some new jerking movements, may be co2 from weaning, may need to increase precedex or change to other sedation, need a BM, add mir, I think he prognosis is poor, will communicate to husband The patient is critically ill with multiple organ systems failure and requires high complexity decision making for assessment and support, frequent evaluation and titration of therapies, application of advanced monitoring technologies and extensive interpretation of multiple databases.   Critical Care Time devoted to patient care services described in this note is30 Minutes. This time reflects time of care of this signee: Merrie Roof, MD FACP. This critical care time does not reflect procedure time, or teaching time or supervisory time of PA/NP/Med student/Med Resident etc but could involve care discussion time. Rest per NP/medical resident whose note is outlined above and that I agree with   Lavon Paganini. Titus Mould, MD, [place] Pgr: [facility] 04/05/2016 10:07 AM

## 2016-04-06 ENCOUNTER — Inpatient Hospital Stay (HOSPITAL_COMMUNITY): Payer: Medicare Other

## 2016-04-06 DIAGNOSIS — J9620 Acute and chronic respiratory failure, unspecified whether with hypoxia or hypercapnia: Secondary | ICD-10-CM

## 2016-04-06 LAB — RENAL FUNCTION PANEL
Albumin: 1.7 g/dL — ABNORMAL LOW (ref 3.5–5.0)
Anion gap: 6 (ref 5–15)
BUN: 26 mg/dL — AB (ref 6–20)
CALCIUM: 7.7 mg/dL — AB (ref 8.9–10.3)
CO2: 34 mmol/L — AB (ref 22–32)
CREATININE: 0.79 mg/dL (ref 0.44–1.00)
Chloride: 99 mmol/L — ABNORMAL LOW (ref 101–111)
GFR calc Af Amer: >60 mL/min (ref >60)
GFR calc non Af Amer: >60 mL/min (ref >60)
GLUCOSE: 234 mg/dL — AB (ref 65–99)
Phosphorus: 4.1 mg/dL (ref 2.5–4.6)
Potassium: 3.6 mmol/L (ref 3.5–5.1)
SODIUM: 139 mmol/L (ref 135–145)

## 2016-04-06 LAB — CBC WITH DIFFERENTIAL/PLATELET
Basophils Absolute: 0 10*3/uL (ref 0.0–0.1)
Basophils Relative: 0 %
EOS ABS: 0 10*3/uL (ref 0.0–0.7)
EOS PCT: 0 %
HCT: 25.9 % — ABNORMAL LOW (ref 36.0–46.0)
Hemoglobin: 7.9 g/dL — ABNORMAL LOW (ref 12.0–15.0)
LYMPHS ABS: 0.5 10*3/uL — AB (ref 0.7–4.0)
Lymphocytes Relative: 5 %
MCH: 27.8 pg (ref 26.0–34.0)
MCHC: 30.5 g/dL (ref 30.0–36.0)
MCV: 91.2 fL (ref 78.0–100.0)
MONOS PCT: 2 %
Monocytes Absolute: 0.3 10*3/uL (ref 0.1–1.0)
Neutro Abs: 10 10*3/uL — ABNORMAL HIGH (ref 1.7–7.7)
Neutrophils Relative %: 93 %
PLATELETS: 93 10*3/uL — AB (ref 150–400)
RBC: 2.84 MIL/uL — ABNORMAL LOW (ref 3.87–5.11)
RDW: 17.3 % — ABNORMAL HIGH (ref 11.5–15.5)
WBC: 10.7 10*3/uL — ABNORMAL HIGH (ref 4.0–10.5)

## 2016-04-06 LAB — GLUCOSE, CAPILLARY
GLUCOSE-CAPILLARY: 163 mg/dL — AB (ref 65–99)
GLUCOSE-CAPILLARY: 138 mg/dL — AB (ref 65–99)
GLUCOSE-CAPILLARY: 199 mg/dL — AB (ref 65–99)
Glucose-Capillary: 243 mg/dL — ABNORMAL HIGH (ref 65–99)
Glucose-Capillary: 171 mg/dL — ABNORMAL HIGH (ref 65–99)

## 2016-04-06 LAB — PHOSPHORUS: PHOSPHORUS: 4 mg/dL (ref 2.5–4.6)

## 2016-04-06 LAB — MAGNESIUM: Magnesium: 1.3 mg/dL — ABNORMAL LOW (ref 1.7–2.4)

## 2016-04-06 MED ORDER — MAGNESIUM SULFATE 2 GM/50ML IV SOLN
2.0000 g | Freq: Once | INTRAVENOUS | Status: AC
  Administered 2016-04-06: 2 g via INTRAVENOUS
  Filled 2016-04-06: qty 50

## 2016-04-06 MED ORDER — IPRATROPIUM-ALBUTEROL 0.5-2.5 (3) MG/3ML IN SOLN
3.0000 mL | Freq: Three times a day (TID) | RESPIRATORY_TRACT | Status: DC
  Administered 2016-04-07 – 2016-04-12 (×15): 3 mL via RESPIRATORY_TRACT
  Filled 2016-04-06 (×17): qty 3

## 2016-04-06 MED ORDER — HALOPERIDOL LACTATE 5 MG/ML IJ SOLN
5.0000 mg | Freq: Four times a day (QID) | INTRAMUSCULAR | Status: DC | PRN
  Administered 2016-04-06: 5 mg via INTRAVENOUS
  Filled 2016-04-06: qty 1

## 2016-04-06 NOTE — Progress Notes (Signed)
PULMONARY / CRITICAL CARE MEDICINE   Name: [name] MRN: [id number] DOB: 08-02-1945    ADMISSION DATE:  03/31/2016 CONSULTATION DATE:  1/16  REFERRING MD:  Dr. [place] Pink  CHIEF COMPLAINT:  AMS  brief:  71 year old female with past medical history as below, which is significant for COPD on home O2, diastolic CHF, atrial fibrillation on full dose aspirin, protein calorie malnutrition, and recent admission (12/2015) for MRSA pneumonia requiring mechanical ventilation. During that admission she was dilated by palliative care medicine and it was deemed that she would remain a full code with aggressive measures. Family reports that since her recent admissions she has never fully regained her strength and has been limited in her function at home, and has had a very poor appetite. These findings progressively worsened in the days leading to her presentation to [facility] 1/14 accompanied with complaints of worsening chronic cough and thick yellow sputum production.   Upon arrival to the emergency department she was afebrile requiring supplemental oxygen to maintain sats above 90%. Chest x-ray concerning for left upper lobe infiltrate. She was admitted to the hospitalist service for treatment of HCAP with broad-spectrum antibiotics and questionable COPD exacerbation. Hospital course complicated by progressive lethargy, which progressed to near unresponsiveness on the morning of 1/16. ABG at that time significant for pH 7.16. PCCM was consulted for further valuation.   SIGNIFICANT EVENTS: 03/31/2016 - dmit for MRS PNA on vent. Sputum with MRSA and psedumononas 1.16 ETT 1/16>>> 1/16 L IJ CVL 1/16>>>  1/16 sputum - pseudomonas again 1/19 - husband cntemplating DNR. Afebrile overnight. Sedated.     SUBJECTIVE/OVERNIGHT/INTERVAL HX 04/06/16 -= fever curve improving slowly. Sedated on precredex and prn fent/versed. Stil gets agitated    VITAL SIGNS: BP (!) 151/78   Pulse (!) 114   Temp 98.7 F  (37.1 C) (Oral)   Resp (!) 27   Ht 4\' 10"  (1.473 m)   Wt 44.2 kg (97 lb 7.1 oz)   SpO2 94%   BMI 20.37 kg/m   HEMODYNAMICS:    VENTILATOR SETTINGS: Vent Mode: CPAP;PSV FiO2 (%):  [30 %] 30 % Set Rate:  [18 bmp] 18 bmp Vt Set:  [320 mL] 320 mL PEEP:  [5 cmH20] 5 cmH20 Pressure Support:  [5 cmH20-8 cmH20] 8 cmH20 Plateau Pressure:  [20 cmH20-22 cmH20] 20 cmH20  INTAKE / OUTPUT: I/O last 3 completed shifts: In: 4415.6 [I.V.:905.6; NG/GT:1205; IV Piggyback:2305] Out: [id number] [Urine:4295]  PHYSICAL EXAMINATION: General:  Frail, chronically ill appearing woman, NAD  Neuro:  Agitated easily. Moves all 4s. Occ follow commands HEENT:  [place]/AT, PERRL, no JVD Cardiovascular: RRR, no m/g/r  Lungs:  resps even non labored on vent, diminished throughout  Abdomen:  BS+, soft, non-tender, non-distended Musculoskeletal:  No acute deformity Skin:  Multiple BLE wounds    LABS:  PULMONARY  Recent Labs Lab 03/31/16 1956 04/02/16 0854 04/02/16 1225 04/03/16 1215  PHART 7.471* 7.145* 7.351 7.468*  PCO2ART 80.1* ABOVE REPORTABLE RANGE 63.8* 45.4  PO2ART 79* 88.3 199* 64.0*  HCO3 58.7* 39.6* 34.5* 32.9*  TCO2 >50.0  --   --  34  O2SAT 96.0 95.9 99.4 93.0    CBC  Recent Labs Lab 04/04/16 0505 04/05/16 0354 04/06/16 0439  HGB 8.8* 8.7* 7.9*  HCT 28.2* 27.7* 25.9*  WBC 10.1 11.4* 10.7*  PLT 134* 117* 93*    COAGULATION  Recent Labs Lab 04/01/16 0725  INR 1.26    CARDIAC  No results for input(s): TROPONINI in the last 168 hours. No  results for input(s): PROBNP in the last 168 hours.   CHEMISTRY  Recent Labs Lab 04/03/16 0415  04/03/16 2023 04/04/16 0505 04/04/16 1611 04/05/16 0354 04/05/16 0355 04/05/16 2009 04/06/16 0439 04/06/16 0440  NA 145  --   --  146*  --   --  140 138  --  139  K 3.2*  --   --  3.0*  --   --  2.7* 3.9  --  3.6  CL 106  --   --  100*  --   --  95* 98*  --  99*  CO2 33*  --   --  35*  --   --  35* 34*  --  34*  GLUCOSE 149*  --    --  239*  --   --  181* 115*  --  234*  BUN 11  --   --  19  --   --  26* 26*  --  26*  CREATININE 0.81  --   --  0.91  --   --  0.82 0.75  --  0.79  CALCIUM 7.8*  --   --  7.5*  --   --  7.7* 7.6*  --  7.7*  MG 1.5*  < > 2.5* 2.2 1.5* 2.7*  --   --  1.3*  --   PHOS <1.0*  < > 2.0* 2.9  9.1* 1.7*  --  1.6* 2.2* 4.0 4.1  < > = values in this interval not displayed. Estimated Creatinine Clearance: 41.6 mL/min (by C-G formula based on SCr of 0.79 mg/dL).   LIVER  Recent Labs Lab 03/31/16 1930 04/01/16 0239 04/01/16 0725 04/03/16 0415 04/04/16 0505 04/05/16 0355 04/06/16 0440  AST 26 13*  --   --   --   --   --   ALT 12* 10*  --   --   --   --   --   ALKPHOS 101 69  --   --   --   --   --   BILITOT 1.1 0.9  --   --   --   --   --   PROT 7.0 5.3*  --   --   --   --   --   ALBUMIN 2.0* 1.5*  --  1.4* 1.6* 1.7* 1.7*  INR  --   --  1.26  --   --   --   --      INFECTIOUS  Recent Labs Lab 03/31/16 1956 03/31/16 2156 04/01/16 0236  04/02/16 1320 04/03/16 0415 04/04/16 0505  LATICACIDVEN 5.71* 1.07 0.7  --   --   --   --   PROCALCITON  --   --   --   < > 0.24 0.18 0.13  < > = values in this interval not displayed.   ENDOCRINE CBG (last 3)   Recent Labs  04/05/16 2347 04/06/16 0343 04/06/16 0803  GLUCAP 175* 243* 163*         IMAGING x48h  - image(s) personally visualized  -   highlighted in bold Dg Chest Port 1 View  Result Date: 04/06/2016 CLINICAL DATA:  ETT. EXAM: PORTABLE CHEST 1 VIEW COMPARISON:  April 05, 2016 FINDINGS: The left central line and ET tube are in good position. Volume loss and scarring are again seen in the apices. No pneumothorax. Small effusions with underlying atelectasis. No other interval changes. IMPRESSION: No interval change. Stable support apparatus. Stable scarring and volume loss  in the apices and stable small bilateral pleural effusions with underlying opacities. Electronically Signed   By: Dorise Bullion III M.D   On:  04/06/2016 07:13   Dg Chest Portable 1 View  Result Date: 04/05/2016 CLINICAL DATA:  Hypoxia EXAM: PORTABLE CHEST 1 VIEW COMPARISON:  April 04, 2016 FINDINGS: Endotracheal tube tip is 1.0 cm above the carina. Central catheter tip is in the superior vena cava. Nasogastric tube tip and side port below the diaphragm. No pneumothorax. There is cicatrization with volume loss in the upper lobes bilaterally. There are pleural effusions bilaterally with bibasilar patchy consolidation. No new opacity. Heart size normal. Distortion of the pulmonary vascularity bilaterally is stable. No obvious adenopathy. No evident bone lesions. IMPRESSION: Tube and catheter positions as described. Note that the endotracheal tube is near the carina and may need to be withdrawn 2-3 cm. No pneumothorax. Stable areas of volume loss with cicatrization in the upper lobes. Small pleural effusions with patchy consolidation both lung bases, stable. Stable cardiac silhouette. Electronically Signed   By: Lowella Grip III M.D.   On: 04/05/2016 06:49       STUDIES:    CULTURES: [facility] 1/14 >>> Sputum 1/14 >>> moderate pseudomonas, mod staph aureus>>> MRSA Urine 1/14 >>> no growth Sputum 1/16>> Pseudomonas  ANTIBIOTICS: Cefepime 1/14>>>1/18 Levaquin 1/14>>>1/17 Vanco 1/14 >>>1/28 Cipro 1/18>>>1/28  ASSESSMENT / PLAN:  71 year old female with severe COPD on home O2 with prolonged admission 12/2015 for MRSA PNA requiring vent.  Since then has been frail, weak, poor functional status at home per family.  Again aditted 1/14 by [facility] with HCAP and AECOPD.  Had worsening respiratory and mental status, failed bipap and tx ICU for intubation.     ASSESSMENT / PLAN:  PULMONARY Acute on chronic hypercarbic respiratory failure - r/t AECOPD, HCAP.   HCAP - MRSA, pseudomonas. Recent MRSA PNA in Oct 2017 AECOPD   04/06/16 - on full vent support . Agitated and fails SBT  PLAN -  Vent support - 8cc/kg  Daily SBT  IV steroids   Duonebs q4h  Daily SBT  Continue abx as above   CARDIOVASCULAR Sepsis secondary to HCAP - resolved.  Chronic dCHF  AFib     - nil acute 1/20  PLAN -  Continue metoprolol 25mg  BID Not chronically anticoagulated  ASA  RENAL  Recent Labs Lab 04/03/16 0415 04/04/16 0505 04/05/16 0355 04/05/16 2009 04/06/16 0440  CREATININE 0.81 0.91 0.82 0.75 0.79   Low mag +  P:   Replete mag bmet track  GASTROINTESTINAL Protein calorie malnutrition  PLAN -  Continue TF  PPI  Will need speech once extubated prior to PO diet   HEMATOLOGIC  Recent Labs Lab 04/04/16 0505 04/05/16 0354 04/06/16 0439  HGB 8.8* 8.7* 7.9*  HCT 28.2* 27.7* 25.9*  WBC 10.1 11.4* 10.7*  PLT 134* 117* 93*    Anemia - likely chronic. No obvious s/s bleeding. S/p 1 unit PRBC 1/17 Thrombocytopenia- steadily dropping  PLAN -  guaiac stools  Dc lvenox -> check HIT panel/check duplex LE -> and low threshold to Rx for possible HIT f/u CBC   INFECTIOUS HCAP - staph, pseudomonas. Recent MRSA P:   Abx as  - vanc, cipro Trend fever and WBC count curves Anti-infectives    Start     Dose/Rate Route Frequency Ordered Stop   04/04/16 2100  vancomycin (VANCOCIN) IVPB 750 mg/150 ml premix     750 mg 150 mL/hr over 60 Minutes Intravenous Every 24 hours 04/04/16  2009 04/13/16 2359   04/04/16 1300  ciprofloxacin (CIPRO) IVPB 400 mg     400 mg 200 mL/hr over 60 Minutes Intravenous Every 12 hours 04/04/16 1220 04/13/16 2359   04/03/16 1400  ceFEPIme (MAXIPIME) 2 g in dextrose 5 % 50 mL IVPB  Status:  Discontinued     2 g 100 mL/hr over 30 Minutes Intravenous Every 12 hours 04/03/16 1328 04/04/16 1220   04/02/16 0000  ceFEPIme (MAXIPIME) 2 g in dextrose 5 % 50 mL IVPB  Status:  Discontinued     2 g 100 mL/hr over 30 Minutes Intravenous Every 24 hours 04/01/16 0002 04/03/16 1328   04/01/16 2000  vancomycin (VANCOCIN) 500 mg in sodium chloride 0.9 % 100 mL IVPB  Status:  Discontinued     500 mg 100 mL/hr  over 60 Minutes Intravenous Every 24 hours 03/31/16 2214 04/04/16 2009   04/01/16 0600  piperacillin-tazobactam (ZOSYN) IVPB 3.375 g  Status:  Discontinued     3.375 g 12.5 mL/hr over 240 Minutes Intravenous Every 8 hours 03/31/16 2214 03/31/16 2357   04/01/16 0300  levofloxacin (LEVAQUIN) IVPB 500 mg  Status:  Discontinued     500 mg 100 mL/hr over 60 Minutes Intravenous Every 48 hours 04/01/16 0222 04/03/16 1124   04/01/16 0000  ceFEPIme (MAXIPIME) 2 g in dextrose 5 % 50 mL IVPB     2 g 100 mL/hr over 30 Minutes Intravenous  Once 03/31/16 2357 04/01/16 0111   03/31/16 1915  piperacillin-tazobactam (ZOSYN) IVPB 3.375 g     3.375 g 100 mL/hr over 30 Minutes Intravenous  Once 03/31/16 1906 03/31/16 2059   03/31/16 1915  vancomycin (VANCOCIN) IVPB 1000 mg/200 mL premix     1,000 mg 200 mL/hr over 60 Minutes Intravenous  Once 03/31/16 1906 03/31/16 2059       ENDOCRINE Hyperglycemia - mild. Likely steroid related. No hx DM  P:   SSI  NEUROLOGIC AMS - r/t hypercarbia. Improving.  Sedation needs on vent   1/20 - can follow commands intermittently.On precedex. Still very agitated  P:   RASS goal: -1 fent gtt  + PRN versed  Daily WUA  Start haldol with qtc monitoring Start restraints  ORTHO:  Bilateral leg wounds  - nonhealing ulcerations, recent imaging without cause for concern. Fracture of the right greater trochanter    PLAN -  - Family denied orthopedics consult  - Wound care following    No family available 1/18. Some goals of care done 1/19 - see resident note - husband contemplating DNR    The patient is critically ill with multiple organ systems failure and requires high complexity decision making for assessment and support, frequent evaluation and titration of therapies, application of advanced monitoring technologies and extensive interpretation of multiple databases.   Critical Care Time devoted to patient care services described in this note is  30   Minutes. This time reflects time of care of this signee Dr Brand Males. This critical care time does not reflect procedure time, or teaching time or supervisory time of PA/NP/Med student/Med Resident etc but could involve care discussion time    Dr. Brand Males, M.D., [facility].C.P Pulmonary and Critical Care Medicine Staff Physician [place] [facility] Pager: [phone], If no answer or between  15:00h - 7:00h: call [phone]  04/06/2016 10:05 AM

## 2016-04-07 ENCOUNTER — Inpatient Hospital Stay (HOSPITAL_COMMUNITY): Payer: Medicare Other

## 2016-04-07 ENCOUNTER — Encounter (HOSPITAL_COMMUNITY): Payer: Medicare Other

## 2016-04-07 LAB — CBC WITH DIFFERENTIAL/PLATELET
BASOS ABS: 0 10*3/uL (ref 0.0–0.1)
Basophils Relative: 0 %
Eosinophils Absolute: 0 10*3/uL (ref 0.0–0.7)
Eosinophils Relative: 0 %
HCT: 26.9 % — ABNORMAL LOW (ref 36.0–46.0)
Hemoglobin: 8.2 g/dL — ABNORMAL LOW (ref 12.0–15.0)
LYMPHS PCT: 3 %
Lymphs Abs: 0.5 10*3/uL — ABNORMAL LOW (ref 0.7–4.0)
MCH: 28 pg (ref 26.0–34.0)
MCHC: 30.5 g/dL (ref 30.0–36.0)
MCV: 91.8 fL (ref 78.0–100.0)
MONO ABS: 0.3 10*3/uL (ref 0.1–1.0)
Monocytes Relative: 2 %
Neutro Abs: 15.8 10*3/uL — ABNORMAL HIGH (ref 1.7–7.7)
Neutrophils Relative %: 95 %
PLATELETS: 128 10*3/uL — AB (ref 150–400)
RBC: 2.93 MIL/uL — ABNORMAL LOW (ref 3.87–5.11)
RDW: 16.8 % — AB (ref 11.5–15.5)
WBC: 16.6 10*3/uL — ABNORMAL HIGH (ref 4.0–10.5)

## 2016-04-07 LAB — GLUCOSE, CAPILLARY
GLUCOSE-CAPILLARY: 134 mg/dL — AB (ref 65–99)
Glucose-Capillary: 134 mg/dL — ABNORMAL HIGH (ref 65–99)
Glucose-Capillary: 142 mg/dL — ABNORMAL HIGH (ref 65–99)
Glucose-Capillary: 149 mg/dL — ABNORMAL HIGH (ref 65–99)
Glucose-Capillary: 153 mg/dL — ABNORMAL HIGH (ref 65–99)
Glucose-Capillary: 155 mg/dL — ABNORMAL HIGH (ref 65–99)

## 2016-04-07 LAB — RENAL FUNCTION PANEL
ALBUMIN: 1.8 g/dL — AB (ref 3.5–5.0)
Anion gap: 6 (ref 5–15)
BUN: 26 mg/dL — AB (ref 6–20)
CALCIUM: 8.2 mg/dL — AB (ref 8.9–10.3)
CHLORIDE: 99 mmol/L — AB (ref 101–111)
CO2: 35 mmol/L — ABNORMAL HIGH (ref 22–32)
CREATININE: 0.73 mg/dL (ref 0.44–1.00)
GFR calc Af Amer: >60 mL/min (ref >60)
Glucose, Bld: 142 mg/dL — ABNORMAL HIGH (ref 65–99)
Phosphorus: 2 mg/dL — ABNORMAL LOW (ref 2.5–4.6)
Potassium: 3.5 mmol/L (ref 3.5–5.1)
SODIUM: 140 mmol/L (ref 135–145)

## 2016-04-07 LAB — EXPECTORATED SPUTUM ASSESSMENT W REFEX TO RESP CULTURE

## 2016-04-07 LAB — EXPECTORATED SPUTUM ASSESSMENT W GRAM STAIN, RFLX TO RESP C

## 2016-04-07 LAB — HEPARIN INDUCED PLATELET AB (HIT ANTIBODY): HEPARIN INDUCED PLT AB: 0.232 {OD_unit} (ref 0.000–0.400)

## 2016-04-07 LAB — MAGNESIUM: MAGNESIUM: 2 mg/dL (ref 1.7–2.4)

## 2016-04-07 MED ORDER — METHYLPREDNISOLONE SODIUM SUCC 40 MG IJ SOLR
40.0000 mg | INTRAMUSCULAR | Status: DC
  Administered 2016-04-07 – 2016-04-09 (×3): 40 mg via INTRAVENOUS
  Filled 2016-04-07 (×3): qty 1

## 2016-04-07 MED ORDER — ACETAMINOPHEN 160 MG/5ML PO SOLN
650.0000 mg | Freq: Four times a day (QID) | ORAL | Status: DC | PRN
  Administered 2016-04-07: 650 mg
  Filled 2016-04-07: qty 20.3

## 2016-04-07 MED ORDER — HALOPERIDOL LACTATE 5 MG/ML IJ SOLN
5.0000 mg | Freq: Four times a day (QID) | INTRAMUSCULAR | Status: DC
  Administered 2016-04-07 – 2016-04-09 (×4): 5 mg via INTRAVENOUS
  Filled 2016-04-07 (×10): qty 1

## 2016-04-07 MED ORDER — SODIUM PHOSPHATES 45 MMOLE/15ML IV SOLN
10.0000 mmol | Freq: Once | INTRAVENOUS | Status: AC
  Administered 2016-04-07: 10 mmol via INTRAVENOUS
  Filled 2016-04-07: qty 3.33

## 2016-04-07 MED ORDER — SODIUM CHLORIDE 0.9 % IV SOLN
250.0000 mL | INTRAVENOUS | Status: DC | PRN
  Administered 2016-04-08: 250 mL via INTRAVENOUS

## 2016-04-07 MED ORDER — SODIUM CHLORIDE 0.9% FLUSH
3.0000 mL | Freq: Two times a day (BID) | INTRAVENOUS | Status: DC
  Administered 2016-04-07 (×2): 3 mL via INTRAVENOUS

## 2016-04-07 MED ORDER — SODIUM CHLORIDE 0.9% FLUSH: 3.0000 mL | INTRAVENOUS | Status: DC | PRN

## 2016-04-07 NOTE — Progress Notes (Addendum)
RT retracted patient's ETT 2cm per MD order. No complications. Vital signs stable at this time. RT will continue to monitor.

## 2016-04-07 NOTE — Progress Notes (Signed)
PULMONARY / CRITICAL CARE MEDICINE   Name: [name] MRN: [id number] DOB: 1946/01/07    ADMISSION DATE:  03/31/2016 CONSULTATION DATE:  1/16  REFERRING MD:  Dr. [place] Pink  CHIEF COMPLAINT:  AMS  brief:  71 year old female with past medical history as below, which is significant for COPD on home O2, diastolic CHF, atrial fibrillation on full dose aspirin, protein calorie malnutrition, and recent admission (12/2015) for MRSA pneumonia requiring mechanical ventilation. During that admission she was dilated by palliative care medicine and it was deemed that she would remain a full code with aggressive measures. Family reports that since her recent admissions she has never fully regained her strength and has been limited in her function at home, and has had a very poor appetite. These findings progressively worsened in the days leading to her presentation to [facility] 1/14 accompanied with complaints of worsening chronic cough and thick yellow sputum production.   Upon arrival to the emergency department she was afebrile requiring supplemental oxygen to maintain sats above 90%. Chest x-ray concerning for left upper lobe infiltrate. She was admitted to the hospitalist service for treatment of HCAP with broad-spectrum antibiotics and questionable COPD exacerbation. Hospital course complicated by progressive lethargy, which progressed to near unresponsiveness on the morning of 1/16. ABG at that time significant for pH 7.16. PCCM was consulted for further valuation.   SIGNIFICANT EVENTS: 03/31/2016 - dmit for MRS PNA on vent. Sputum with MRSA and psedumononas 1.16 ETT 1/16>>> 1/16 L IJ CVL 1/16>>>  1/16 sputum - pseudomonas again 1/19 - husband cntemplating DNR. Afebrile overnight. Sedated.     SUBJECTIVE/OVERNIGHT/INTERVAL HX Low grade fever 100.8 overnight. Still getting agitated on vent. Sedated this morning.    VITAL SIGNS: BP 121/69   Pulse 89   Temp (!) 100.8 F (38.2 C) (Oral)    Resp (!) 27   Ht 4\' 10"  (1.473 m)   Wt 94 lb 5.7 oz (42.8 kg)   SpO2 97%   BMI 19.72 kg/m   HEMODYNAMICS:    VENTILATOR SETTINGS: Vent Mode: PRVC FiO2 (%):  [30 %] 30 % Set Rate:  [18 bmp] 18 bmp Vt Set:  [320 mL] 320 mL PEEP:  [5 cmH20] 5 cmH20 Pressure Support:  [8 cmH20] 8 cmH20 Plateau Pressure:  [16 cmH20-21 cmH20] 16 cmH20  INTAKE / OUTPUT: I/O last 3 completed shifts: In: 4550.4 [I.V.:850.4; NG/GT:1970; IV [id number] Out: [id number] [Urine:4055]  PHYSICAL EXAMINATION: General:  Frail, chronically ill appearing woman, NAD  Neuro:  Agitated easily. Moves all 4s. Intermittently follow commands HEENT:  [place]/AT, PERRL, no JVD Cardiovascular: RRR, no m/g/r  Lungs:  resps even non labored on vent, coarse bilaterally, diminished throughout  Abdomen:  BS+, soft, non-tender, non-distended Musculoskeletal:  No acute deformity Skin:  Multiple BLE wounds    LABS:  PULMONARY  Recent Labs Lab 03/31/16 1956 04/02/16 0854 04/02/16 1225 04/03/16 1215  PHART 7.471* 7.145* 7.351 7.468*  PCO2ART 80.1* ABOVE REPORTABLE RANGE 63.8* 45.4  PO2ART 79* 88.3 199* 64.0*  HCO3 58.7* 39.6* 34.5* 32.9*  TCO2 >50.0  --   --  34  O2SAT 96.0 95.9 99.4 93.0    CBC  Recent Labs Lab 04/05/16 0354 04/06/16 0439 04/07/16 0243  HGB 8.7* 7.9* 8.2*  HCT 27.7* 25.9* 26.9*  WBC 11.4* 10.7* 16.6*  PLT 117* 93* 128*    COAGULATION  Recent Labs Lab 04/01/16 0725  INR 1.26    CARDIAC  No results for input(s): TROPONINI in the last 168 hours. No results for  input(s): PROBNP in the last 168 hours.   CHEMISTRY  Recent Labs Lab 04/04/16 0505 04/04/16 1611 04/05/16 0354 04/05/16 0355 04/05/16 2009 04/06/16 0439 04/06/16 0440 04/07/16 0243 04/07/16 0245  NA 146*  --   --  140 138  --  139  --  140  K 3.0*  --   --  2.7* 3.9  --  3.6  --  3.5  CL 100*  --   --  95* 98*  --  99*  --  99*  CO2 35*  --   --  35* 34*  --  34*  --  35*  GLUCOSE 239*  --   --  181* 115*  --  234*   --  142*  BUN 19  --   --  26* 26*  --  26*  --  26*  CREATININE 0.91  --   --  0.82 0.75  --  0.79  --  0.73  CALCIUM 7.5*  --   --  7.7* 7.6*  --  7.7*  --  8.2*  MG 2.2 1.5* 2.7*  --   --  1.3*  --  2.0  --   PHOS 2.9  9.1* 1.7*  --  1.6* 2.2* 4.0 4.1  --  2.0*   Estimated Creatinine Clearance: 41.6 mL/min (by C-G formula based on SCr of 0.73 mg/dL).   LIVER  Recent Labs Lab 03/31/16 1930 04/01/16 0239 04/01/16 0725 04/03/16 0415 04/04/16 0505 04/05/16 0355 04/06/16 0440 04/07/16 0245  AST 26 13*  --   --   --   --   --   --   ALT 12* 10*  --   --   --   --   --   --   ALKPHOS 101 69  --   --   --   --   --   --   BILITOT 1.1 0.9  --   --   --   --   --   --   PROT 7.0 5.3*  --   --   --   --   --   --   ALBUMIN 2.0* 1.5*  --  1.4* 1.6* 1.7* 1.7* 1.8*  INR  --   --  1.26  --   --   --   --   --      INFECTIOUS  Recent Labs Lab 03/31/16 1956 03/31/16 2156 04/01/16 0236  04/02/16 1320 04/03/16 0415 04/04/16 0505  LATICACIDVEN 5.71* 1.07 0.7  --   --   --   --   PROCALCITON  --   --   --   < > 0.24 0.18 0.13  < > = values in this interval not displayed.   ENDOCRINE CBG (last 3)   Recent Labs  04/06/16 2031 04/07/16 0036 04/07/16 0420  GLUCAP 171* 155* 134*         IMAGING x48h  - image(s) personally visualized  -   highlighted in bold Dg Chest Port 1 View  Result Date: 04/06/2016 CLINICAL DATA:  ETT. EXAM: PORTABLE CHEST 1 VIEW COMPARISON:  April 05, 2016 FINDINGS: The left central line and ET tube are in good position. Volume loss and scarring are again seen in the apices. No pneumothorax. Small effusions with underlying atelectasis. No other interval changes. IMPRESSION: No interval change. Stable support apparatus. Stable scarring and volume loss in the apices and stable small bilateral pleural effusions with underlying opacities. Electronically Signed  By: Dorise Bullion III M.D   On: 04/06/2016 07:13       STUDIES:    CULTURES: BC  1/14 >>> no growth  Sputum 1/14 >>> moderate pseudomonas, mod staph aureus>>> MRSA Urine 1/14 >>> no growth Sputum 1/16>> Pseudomonas  ANTIBIOTICS: Cefepime 1/14>>>1/18 Levaquin 1/14>>>1/17 Vanco 1/14 >>>1/28 Cipro 1/18>>>1/28  ASSESSMENT / PLAN:  71 year old female with severe COPD on home O2 with prolonged admission 12/2015 for MRSA PNA requiring vent.  Since then has been frail, weak, poor functional status at home per family.  Again aditted 1/14 by [facility] with HCAP and AECOPD.  Had worsening respiratory and mental status, failed bipap and tx ICU for intubation.     ASSESSMENT / PLAN:  PULMONARY Acute on chronic hypercarbic respiratory failure - r/t AECOPD, HCAP.   HCAP - MRSA, pseudomonas. Recent MRSA PNA in Oct 2017 AECOPD   04/06/16 - on full vent support . Agitated and fails SBT  PLAN -  Vent support - 8cc/kg  Daily SBT  Decrease IV steroids to 40 mg daily  Duonebs q4h  Daily SBT  Continue abx as above   CARDIOVASCULAR Sepsis secondary to HCAP - resolved.  Chronic dCHF  AFib   PLAN -  Continue metoprolol 25mg  BID Not chronically anticoagulated  ASA  RENAL  Recent Labs Lab 04/04/16 0505 04/05/16 0355 04/05/16 2009 04/06/16 0440 04/07/16 0245  CREATININE 0.91 0.82 0.75 0.79 0.73   Low mag +- resolved Hypophos  P:   Replete phos bmet track  GASTROINTESTINAL Protein calorie malnutrition  PLAN -  Continue TF  PPI  Will need speech once extubated prior to PO diet   HEMATOLOGIC  Recent Labs Lab 04/05/16 0354 04/06/16 0439 04/07/16 0243  HGB 8.7* 7.9* 8.2*  HCT 27.7* 25.9* 26.9*  WBC 11.4* 10.7* 16.6*  PLT 117* 93* 128*    Anemia - likely chronic. No obvious s/s bleeding. S/p 1 unit PRBC 1/17 Thrombocytopenia- improving   PLAN -  guaiac stools  F/u HIT panel/check duplex LE Trend CBC   INFECTIOUS HCAP - staph, pseudomonas. Recent MRSA P:   Abx as  - vanc, cipro Trend fever and WBC count curves Anti-infectives    Start      Dose/Rate Route Frequency Ordered Stop   04/04/16 2100  vancomycin (VANCOCIN) IVPB 750 mg/150 ml premix     750 mg 150 mL/hr over 60 Minutes Intravenous Every 24 hours 04/04/16 2009 04/13/16 2359   04/04/16 1300  ciprofloxacin (CIPRO) IVPB 400 mg     400 mg 200 mL/hr over 60 Minutes Intravenous Every 12 hours 04/04/16 1220 04/13/16 2359   04/03/16 1400  ceFEPIme (MAXIPIME) 2 g in dextrose 5 % 50 mL IVPB  Status:  Discontinued     2 g 100 mL/hr over 30 Minutes Intravenous Every 12 hours 04/03/16 1328 04/04/16 1220   04/02/16 0000  ceFEPIme (MAXIPIME) 2 g in dextrose 5 % 50 mL IVPB  Status:  Discontinued     2 g 100 mL/hr over 30 Minutes Intravenous Every 24 hours 04/01/16 0002 04/03/16 1328   04/01/16 2000  vancomycin (VANCOCIN) 500 mg in sodium chloride 0.9 % 100 mL IVPB  Status:  Discontinued     500 mg 100 mL/hr over 60 Minutes Intravenous Every 24 hours 03/31/16 2214 04/04/16 2009   04/01/16 0600  piperacillin-tazobactam (ZOSYN) IVPB 3.375 g  Status:  Discontinued     3.375 g 12.5 mL/hr over 240 Minutes Intravenous Every 8 hours 03/31/16 2214 03/31/16 2357   04/01/16  0300  levofloxacin (LEVAQUIN) IVPB 500 mg  Status:  Discontinued     500 mg 100 mL/hr over 60 Minutes Intravenous Every 48 hours 04/01/16 0222 04/03/16 1124   04/01/16 0000  ceFEPIme (MAXIPIME) 2 g in dextrose 5 % 50 mL IVPB     2 g 100 mL/hr over 30 Minutes Intravenous  Once 03/31/16 2357 04/01/16 0111   03/31/16 1915  piperacillin-tazobactam (ZOSYN) IVPB 3.375 g     3.375 g 100 mL/hr over 30 Minutes Intravenous  Once 03/31/16 1906 03/31/16 2059   03/31/16 1915  vancomycin (VANCOCIN) IVPB 1000 mg/200 mL premix     1,000 mg 200 mL/hr over 60 Minutes Intravenous  Once 03/31/16 1906 03/31/16 2059       ENDOCRINE Hyperglycemia - mild. Likely steroid related. No hx DM  P:   SSI  NEUROLOGIC AMS - r/t hypercarbia. Improving.  Sedation needs on vent   P:   RASS goal: -1 fent gtt  + PRN versed  Daily WUA   Haldol with qtc monitoring Soft restraints as needed  ORTHO:  Bilateral leg wounds  - nonhealing ulcerations, recent imaging without cause for concern. Fracture of the right greater trochanter    PLAN -  - Family denied orthopedics consult  - Wound care following    No family available 1/18. Some goals of care done 1/19, husband contemplating DNR. May be here today to discuss further.   Albin Felling, MD, MPH Internal Medicine Resident, PGY-III Pager: [phone] 04/07/2016 6:35 AM

## 2016-04-07 NOTE — Progress Notes (Signed)
I met with the patient's husband, [name], and daughter, Helene Kelp. We had an extensive discussion about the patient's condition and poor prognosis with her multiple hospitalizations related to respiratory distress/pneumonia requiring intunation. Family agrees that the patient would not want to remain on a ventilator machine long term with her poor quality of life. They have agreed to make her a DNR. Family wishes to plan for extubation on 1/22 at 4PM so that they have time to contact other family members and be with the patient after she is extubated. Ultimately, if patient is stable enough to make it home the family would like home hospice care to be involved.   Code status updated to DNR.   Albin Felling, MD, MPH Internal Medicine Resident, PGY-III Pager: [phone]

## 2016-04-07 NOTE — Progress Notes (Signed)
Discussed anticoagulation with MD HIT panel remains in process Awaiting Dopplers to rule out active DVT Pt is not a candidate for Arixtra due to low body weight and high risk of bleeding Await HIT panel and Dopplers, if one of those is positive, will begin argatroban.    Hughes Better, PharmD, BCPS Clinical Pharmacis 04/07/2016 4:00 PM

## 2016-04-07 NOTE — Progress Notes (Signed)
Pt temp 101.2 RN notified

## 2016-04-08 ENCOUNTER — Ambulatory Visit (HOSPITAL_COMMUNITY): Payer: Medicare Other | Admitting: Physical Therapy

## 2016-04-08 ENCOUNTER — Inpatient Hospital Stay (HOSPITAL_COMMUNITY): Payer: Medicare Other

## 2016-04-08 DIAGNOSIS — J449 Chronic obstructive pulmonary disease, unspecified: Secondary | ICD-10-CM

## 2016-04-08 LAB — CBC WITH DIFFERENTIAL/PLATELET
BASOS ABS: 0 10*3/uL (ref 0.0–0.1)
Basophils Relative: 0 %
EOS PCT: 0 %
Eosinophils Absolute: 0 10*3/uL (ref 0.0–0.7)
HEMATOCRIT: 23.1 % — AB (ref 36.0–46.0)
Hemoglobin: 7.1 g/dL — ABNORMAL LOW (ref 12.0–15.0)
LYMPHS ABS: 1.2 10*3/uL (ref 0.7–4.0)
LYMPHS PCT: 11 %
MCH: 28.4 pg (ref 26.0–34.0)
MCHC: 30.7 g/dL (ref 30.0–36.0)
MCV: 92.4 fL (ref 78.0–100.0)
MONO ABS: 0.3 10*3/uL (ref 0.1–1.0)
Monocytes Relative: 3 %
NEUTROS ABS: 9.6 10*3/uL — AB (ref 1.7–7.7)
Neutrophils Relative %: 86 %
PLATELETS: 121 10*3/uL — AB (ref 150–400)
RBC: 2.5 MIL/uL — ABNORMAL LOW (ref 3.87–5.11)
RDW: 16.6 % — ABNORMAL HIGH (ref 11.5–15.5)
WBC: 11.1 10*3/uL — ABNORMAL HIGH (ref 4.0–10.5)

## 2016-04-08 LAB — GLUCOSE, CAPILLARY
GLUCOSE-CAPILLARY: 116 mg/dL — AB (ref 65–99)
GLUCOSE-CAPILLARY: 176 mg/dL — AB (ref 65–99)
GLUCOSE-CAPILLARY: 189 mg/dL — AB (ref 65–99)
Glucose-Capillary: 122 mg/dL — ABNORMAL HIGH (ref 65–99)
Glucose-Capillary: 162 mg/dL — ABNORMAL HIGH (ref 65–99)
Glucose-Capillary: 76 mg/dL (ref 65–99)
Glucose-Capillary: 84 mg/dL (ref 65–99)

## 2016-04-08 LAB — URINALYSIS, ROUTINE W REFLEX MICROSCOPIC
BILIRUBIN URINE: NEGATIVE
Bacteria, UA: NONE SEEN
Glucose, UA: 50 mg/dL — AB
Hgb urine dipstick: NEGATIVE
Ketones, ur: NEGATIVE mg/dL
Nitrite: NEGATIVE
PH: 6 (ref 5.0–8.0)
Protein, ur: NEGATIVE mg/dL
SPECIFIC GRAVITY, URINE: 1.014 (ref 1.005–1.030)

## 2016-04-08 LAB — RENAL FUNCTION PANEL
Albumin: 1.6 g/dL — ABNORMAL LOW (ref 3.5–5.0)
Anion gap: 5 (ref 5–15)
BUN: 25 mg/dL — AB (ref 6–20)
CHLORIDE: 100 mmol/L — AB (ref 101–111)
CO2: 36 mmol/L — AB (ref 22–32)
CREATININE: 0.71 mg/dL (ref 0.44–1.00)
Calcium: 7.8 mg/dL — ABNORMAL LOW (ref 8.9–10.3)
GFR calc non Af Amer: >60 mL/min (ref >60)
Glucose, Bld: 171 mg/dL — ABNORMAL HIGH (ref 65–99)
POTASSIUM: 2.7 mmol/L — AB (ref 3.5–5.1)
Phosphorus: 1.9 mg/dL — ABNORMAL LOW (ref 2.5–4.6)
Sodium: 141 mmol/L (ref 135–145)

## 2016-04-08 LAB — MAGNESIUM: Magnesium: 1.3 mg/dL — ABNORMAL LOW (ref 1.7–2.4)

## 2016-04-08 LAB — VANCOMYCIN, TROUGH: Vancomycin Tr: 13 ug/mL — ABNORMAL LOW (ref 15–20)

## 2016-04-08 MED ORDER — MORPHINE SULFATE (PF) 2 MG/ML IV SOLN
2.0000 mg | INTRAVENOUS | Status: DC | PRN
  Administered 2016-04-10: 2 mg via INTRAVENOUS
  Filled 2016-04-08: qty 1
  Filled 2016-04-08: qty 2

## 2016-04-08 MED ORDER — VANCOMYCIN HCL IN DEXTROSE 1-5 GM/200ML-% IV SOLN
1000.0000 mg | INTRAVENOUS | Status: DC
  Administered 2016-04-09 – 2016-04-11 (×3): 1000 mg via INTRAVENOUS
  Filled 2016-04-08 (×4): qty 200

## 2016-04-08 MED ORDER — ORAL CARE MOUTH RINSE
15.0000 mL | Freq: Two times a day (BID) | OROMUCOSAL | Status: DC
  Administered 2016-04-08 – 2016-04-12 (×7): 15 mL via OROMUCOSAL

## 2016-04-08 MED ORDER — POTASSIUM PHOSPHATES 15 MMOLE/5ML IV SOLN
20.0000 mmol | Freq: Once | INTRAVENOUS | Status: AC
  Administered 2016-04-08: 20 mmol via INTRAVENOUS
  Filled 2016-04-08: qty 6.67

## 2016-04-08 MED ORDER — SODIUM CHLORIDE 0.9 % IV SOLN
30.0000 meq | Freq: Once | INTRAVENOUS | Status: AC
  Administered 2016-04-08: 30 meq via INTRAVENOUS
  Filled 2016-04-08: qty 15

## 2016-04-08 MED ORDER — SODIUM CHLORIDE 0.9 % IV SOLN
6.0000 g | Freq: Once | INTRAVENOUS | Status: AC
  Administered 2016-04-08: 6 g via INTRAVENOUS
  Filled 2016-04-08: qty 12

## 2016-04-08 MED ORDER — HEPARIN SODIUM (PORCINE) 5000 UNIT/ML IJ SOLN
5000.0000 [IU] | Freq: Three times a day (TID) | INTRAMUSCULAR | Status: DC
  Administered 2016-04-08 – 2016-04-12 (×13): 5000 [IU] via SUBCUTANEOUS
  Filled 2016-04-08 (×15): qty 1

## 2016-04-08 MED ORDER — SODIUM CHLORIDE 0.9 % IV SOLN
30.0000 meq | INTRAVENOUS | Status: AC
  Administered 2016-04-08: 30 meq via INTRAVENOUS
  Filled 2016-04-08 (×3): qty 15

## 2016-04-08 NOTE — Progress Notes (Signed)
VASCULAR LAB PRELIMINARY  PRELIMINARY  PRELIMINARY  PRELIMINARY  Bilateral lower extremity venous duplex completed.    Preliminary report:  There is no obvious evidence of DVT or SVT noted in the visualized veins of the bilateral lower extremities.   [name], RVT 04/08/2016, 10:38 AM

## 2016-04-08 NOTE — Procedures (Signed)
Extubation Procedure Note  Patient Details:   Name: [name] DOB: 1946/01/08 MRN: [id number]   Airway Documentation: Pt extubated to 4 lpm [place] sat 100%.  Pt talking to her family and coughing.  Tolerated well.    Evaluation  O2 sats: stable throughout Complications: No apparent complications Patient did tolerate procedure well. Bilateral Breath Sounds: Rhonchi   Yes  Ned Grace 04/08/2016, 4:27 PM

## 2016-04-08 NOTE — Progress Notes (Signed)
   Met w/ family and patient at bedside.  Offered prayer.  Page on-call chaplain, as needed.  - Rev. Arkoma MDiv ThM

## 2016-04-08 NOTE — Progress Notes (Signed)
PULMONARY / CRITICAL CARE MEDICINE   Name: [name] MRN: [id number] DOB: 1945/08/13    ADMISSION DATE:  03/31/2016 CONSULTATION DATE:  1/16  REFERRING MD:  Dr. [place] Pink  CHIEF COMPLAINT:  AMS  brief:  71 year old female with past medical history as below, which is significant for COPD on home O2, diastolic CHF, atrial fibrillation on full dose aspirin, protein calorie malnutrition, and recent admission (12/2015) for MRSA pneumonia requiring mechanical ventilation. During that admission she was dilated by palliative care medicine and it was deemed that she would remain a full code with aggressive measures. Family reports that since her recent admissions she has never fully regained her strength and has been limited in her function at home, and has had a very poor appetite. These findings progressively worsened in the days leading to her presentation to [facility] 1/14 accompanied with complaints of worsening chronic cough and thick yellow sputum production.   Upon arrival to the emergency department she was afebrile requiring supplemental oxygen to maintain sats above 90%. Chest x-ray concerning for left upper lobe infiltrate. She was admitted to the hospitalist service for treatment of HCAP with broad-spectrum antibiotics and questionable COPD exacerbation. Hospital course complicated by progressive lethargy, which progressed to near unresponsiveness on the morning of 1/16. ABG at that time significant for pH 7.16. PCCM was consulted for further valuation.   SIGNIFICANT EVENTS: 03/31/2016 - dmit for MRS PNA on vent. Sputum with MRSA and psedumononas 1.16 ETT 1/16>>> 1/16 L IJ CVL 1/16>>>  1/16 sputum - pseudomonas again 1/19 - husband cntemplating DNR. Afebrile overnight. Sedated.     SUBJECTIVE/OVERNIGHT/INTERVAL HX Fever to 103 yesterday. Agitation improving. Weaned for 3 hours yesterday. Family discussion yesterday and agree to DNR. Following commands this morning.    VITAL  SIGNS: BP (!) 96/58   Pulse 67   Temp 98.6 F (37 C) (Oral)   Resp 16   Ht 4\' 10"  (1.473 m)   Wt 93 lb 0.6 oz (42.2 kg)   SpO2 99%   BMI 19.44 kg/m   HEMODYNAMICS: CVP:  [4 mmHg] 4 mmHg  VENTILATOR SETTINGS: Vent Mode: PRVC FiO2 (%):  [30 %] 30 % Set Rate:  [18 bmp] 18 bmp Vt Set:  [320 mL] 320 mL PEEP:  [5 cmH20] 5 cmH20 Pressure Support:  [8 cmH20] 8 cmH20 Plateau Pressure:  [17 cmH20-25 cmH20] 17 cmH20  INTAKE / OUTPUT: I/O last 3 completed shifts: In: 4008.5 [I.V.:743.5; NG/GT:2115; IV [id number] Out: 3195 [Urine:2395; Stool:800]  PHYSICAL EXAMINATION: General:  Frail, chronically ill appearing woman, NAD  Neuro:  Agitated improving. Follows commands. Moves all 4s.  HEENT:  [place]/AT, PERRL, no JVD, ETT in place Cardiovascular: RRR, no m/g/r  Lungs:  resps even non labored on vent, coarse bilaterally, diminished throughout  Abdomen:  BS+, soft, non-tender, non-distended Musculoskeletal:  No acute deformity Skin:  Multiple BLE wounds, unna boots in place  LABS:  PULMONARY  Recent Labs Lab 04/02/16 0854 04/02/16 1225 04/03/16 1215  PHART 7.145* 7.351 7.468*  PCO2ART ABOVE REPORTABLE RANGE 63.8* 45.4  PO2ART 88.3 199* 64.0*  HCO3 39.6* 34.5* 32.9*  TCO2  --   --  34  O2SAT 95.9 99.4 93.0    CBC  Recent Labs Lab 04/06/16 0439 04/07/16 0243 04/08/16 0425  HGB 7.9* 8.2* 7.1*  HCT 25.9* 26.9* 23.1*  WBC 10.7* 16.6* 11.1*  PLT 93* 128* 121*    COAGULATION  Recent Labs Lab 04/01/16 0725  INR 1.26    CARDIAC  No results for  input(s): TROPONINI in the last 168 hours. No results for input(s): PROBNP in the last 168 hours.   CHEMISTRY  Recent Labs Lab 04/04/16 1611 04/05/16 0354 04/05/16 0355 04/05/16 2009 04/06/16 0439 04/06/16 0440 04/07/16 0243 04/07/16 0245 04/08/16 0425  NA  --   --  140 138  --  139  --  140 141  K  --   --  2.7* 3.9  --  3.6  --  3.5 2.7*  CL  --   --  95* 98*  --  99*  --  99* 100*  CO2  --   --  35*  34*  --  34*  --  35* 36*  GLUCOSE  --   --  181* 115*  --  234*  --  142* 171*  BUN  --   --  26* 26*  --  26*  --  26* 25*  CREATININE  --   --  0.82 0.75  --  0.79  --  0.73 0.71  CALCIUM  --   --  7.7* 7.6*  --  7.7*  --  8.2* 7.8*  MG 1.5* 2.7*  --   --  1.3*  --  2.0  --  1.3*  PHOS 1.7*  --  1.6* 2.2* 4.0 4.1  --  2.0* 1.9*   Estimated Creatinine Clearance: 41 mL/min (by C-G formula based on SCr of 0.71 mg/dL).   LIVER  Recent Labs Lab 04/01/16 0725  04/04/16 0505 04/05/16 0355 04/06/16 0440 04/07/16 0245 04/08/16 0425  ALBUMIN  --   < > 1.6* 1.7* 1.7* 1.8* 1.6*  INR 1.26  --   --   --   --   --   --   < > = values in this interval not displayed.   INFECTIOUS  Recent Labs Lab 04/02/16 1320 04/03/16 0415 04/04/16 0505  PROCALCITON 0.24 0.18 0.13     ENDOCRINE CBG (last 3)   Recent Labs  04/07/16 2021 04/08/16 0001 04/08/16 0403  GLUCAP 142* 176* 162*         IMAGING x48h  - image(s) personally visualized  -   highlighted in bold Dg Chest Port 1 View  Result Date: 04/07/2016 CLINICAL DATA:  Healthcare associated pneumonia. Repositioning of ET tube. EXAM: PORTABLE CHEST 1 VIEW COMPARISON:  04/07/2016 FINDINGS: The endotracheal tube tip is positioned 2.9 cm above the carina. Left IJ catheter tip is in the cavoatrial junction. There is a nasogastric tube with tip well below the GE junction. Heart size is normal. There are bilateral pleural effusions right greater in left. Similar appearance of right lung base opacity. Extensive bilateral upper lobe scarring is unchanged. IMPRESSION: ET tube tip is now well positioned above the carina. No change in bilateral pleural effusions and right lung base opacity Electronically Signed   By: Kerby Moors M.D.   On: 04/07/2016 11:42   Dg Chest Port 1 View  Result Date: 04/07/2016 CLINICAL DATA:  Acute on chronic respiratory failure. EXAM: PORTABLE CHEST 1 VIEW COMPARISON:  April 06, 2016 FINDINGS: The ETT has been  advanced and the distal tip is now at the origin of the right mainstem bronchus. Recommend withdrawing 3 cm. Other support apparatus is stable. Scarring remains in the apices. No pneumothorax. Small bilateral effusions and underlying opacities remain. IMPRESSION: 1. The ET tube has been advanced and now terminates at the origin of the right mainstem bronchus. Recommend withdrawing 3 cm. 2. Stable scarring in the apices and stable  small bilateral effusions with underlying opacities. These results will be called to the ordering clinician or representative by the Radiologist Assistant, and communication documented in the PACS or zVision Dashboard. Electronically Signed   By: Dorise Bullion III M.D   On: 04/07/2016 07:56       STUDIES:    CULTURES: BC 1/14 >>> no growth  Sputum 1/14 >>> moderate pseudomonas, mod staph aureus>>> MRSA Urine 1/14 >>> no growth Sputum 1/16>> Pseudomonas Resp cx 1/121>> Blood cx 1/22>>  ANTIBIOTICS: Cefepime 1/14>>>1/18 Levaquin 1/14>>>1/17 Vanco 1/14 >>>1/28 Cipro 1/18>>>1/28  ASSESSMENT / PLAN:  71 year old female with severe COPD on home O2 with prolonged admission 12/2015 for MRSA PNA requiring vent.  Since then has been frail, weak, poor functional status at home per family.  Again aditted 1/14 by [facility] with HCAP and AECOPD.  Had worsening respiratory and mental status, failed bipap and tx ICU for intubation.     ASSESSMENT / PLAN:  PULMONARY Acute on chronic hypercarbic respiratory failure - r/t AECOPD, HCAP.   HCAP - MRSA, pseudomonas. Recent MRSA PNA in Oct 2017 AECOPD   04/06/16 - on full vent support . Agitated and fails SBT  PLAN -  Vent support - 8cc/kg  Daily SBT  F/u CXR from this AM  Continue Solu-medrol 40 mg daily, stop tomorrow  Duonebs q4h  Continue abx as above  Possible extubation today if weans well again   CARDIOVASCULAR Sepsis secondary to HCAP - resolved.  Chronic dCHF  AFib   PLAN -  Continue metoprolol 25mg   BID Not chronically anticoagulated  ASA  RENAL  Recent Labs Lab 04/05/16 0355 04/05/16 2009 04/06/16 0440 04/07/16 0245 04/08/16 0425  CREATININE 0.82 0.75 0.79 0.73 0.71   Low mag +- resolved Hypophos Hypokalemia Hypomag  P:   Replete electrolytes Repeat bmet this afternoon  GASTROINTESTINAL Protein calorie malnutrition  PLAN -  Continue TF  PPI  Will need speech once extubated prior to PO diet   HEMATOLOGIC  Recent Labs Lab 04/06/16 0439 04/07/16 0243 04/08/16 0425  HGB 7.9* 8.2* 7.1*  HCT 25.9* 26.9* 23.1*  WBC 10.7* 16.6* 11.1*  PLT 93* 128* 121*    Anemia - likely chronic. No obvious s/s bleeding. S/p 1 unit PRBC 1/17 Thrombocytopenia- improving   PLAN -  guaiac stools  F/u HIT panel/check duplex LE- HIT antibody neg Trend CBC   INFECTIOUS HCAP - staph, pseudomonas. Recent MRSA Recurrent fever P:   Abx as  - vanc, cipro Trend fever and WBC count curves F/u resp cx Get blood cx, UA Anti-infectives    Start     Dose/Rate Route Frequency Ordered Stop   04/04/16 2100  vancomycin (VANCOCIN) IVPB 750 mg/150 ml premix     750 mg 150 mL/hr over 60 Minutes Intravenous Every 24 hours 04/04/16 2009 04/13/16 2359   04/04/16 1300  ciprofloxacin (CIPRO) IVPB 400 mg     400 mg 200 mL/hr over 60 Minutes Intravenous Every 12 hours 04/04/16 1220 04/13/16 2359   04/03/16 1400  ceFEPIme (MAXIPIME) 2 g in dextrose 5 % 50 mL IVPB  Status:  Discontinued     2 g 100 mL/hr over 30 Minutes Intravenous Every 12 hours 04/03/16 1328 04/04/16 1220   04/02/16 0000  ceFEPIme (MAXIPIME) 2 g in dextrose 5 % 50 mL IVPB  Status:  Discontinued     2 g 100 mL/hr over 30 Minutes Intravenous Every 24 hours 04/01/16 0002 04/03/16 1328   04/01/16 2000  vancomycin (VANCOCIN) 500 mg in  sodium chloride 0.9 % 100 mL IVPB  Status:  Discontinued     500 mg 100 mL/hr over 60 Minutes Intravenous Every 24 hours 03/31/16 2214 04/04/16 2009   04/01/16 0600  piperacillin-tazobactam  (ZOSYN) IVPB 3.375 g  Status:  Discontinued     3.375 g 12.5 mL/hr over 240 Minutes Intravenous Every 8 hours 03/31/16 2214 03/31/16 2357   04/01/16 0300  levofloxacin (LEVAQUIN) IVPB 500 mg  Status:  Discontinued     500 mg 100 mL/hr over 60 Minutes Intravenous Every 48 hours 04/01/16 0222 04/03/16 1124   04/01/16 0000  ceFEPIme (MAXIPIME) 2 g in dextrose 5 % 50 mL IVPB     2 g 100 mL/hr over 30 Minutes Intravenous  Once 03/31/16 2357 04/01/16 0111   03/31/16 1915  piperacillin-tazobactam (ZOSYN) IVPB 3.375 g     3.375 g 100 mL/hr over 30 Minutes Intravenous  Once 03/31/16 1906 03/31/16 2059   03/31/16 1915  vancomycin (VANCOCIN) IVPB 1000 mg/200 mL premix     1,000 mg 200 mL/hr over 60 Minutes Intravenous  Once 03/31/16 1906 03/31/16 2059       ENDOCRINE Hyperglycemia - mild. Likely steroid related. No hx DM  P:   SSI  NEUROLOGIC AMS - r/t hypercarbia. Improving.  Sedation needs on vent   P:   RASS goal: -1 fent gtt  + PRN versed  Daily WUA  Haldol with qtc monitoring- dc, not needing Soft restraints as needed  ORTHO:  Bilateral leg wounds  - nonhealing ulcerations, recent imaging without cause for concern. Fracture of the right greater trochanter    PLAN -  - Family denied orthopedics consult  - Wound care following    Family meeting 1/21-  Have agreed to DNR. Plan was for extubation today at 4 PM given patient had improved clinically and was weaning well, but this may have to be delayed with new fever. Will see how she weans today.    Albin Felling, MD, MPH Internal Medicine Resident, PGY-III Pager: [phone] 04/08/2016 6:35 AM

## 2016-04-08 NOTE — Progress Notes (Addendum)
Pharmacy Antibiotic Note   71 y.o. female admitted on 03/31/2016 with sepsis.  Pt has had multiple admissions and has had separate episodes of a MRSA pneumonia and Pseudomonas pneumonia in the past year. Her respiratory culture is currently growing MRSA and pseudomonas.   Vancomycin trough steady state = 13 mcg/ml on Vancomycin 750 mg IV q24h  Goal vanc trough = 15-20 mcg/ml for PNA.   Plan: Increase Vancomycin to 1000 mg IV q24h Monitor renal fx, cultures and susceptibilities, obtain VT as needed   Height: 4\' 10"  (147.3 cm) Weight: 93 lb 0.6 oz (42.2 kg) IBW/kg (Calculated) : 40.9  Temp (24hrs), Avg:98.8 F (37.1 C), Min:98.2 F (36.8 C), Max:99.9 F (37.7 C)   Recent Labs Lab 04/04/16 0505 04/04/16 1921 04/05/16 0354 04/05/16 0355 04/05/16 2009 04/06/16 0439 04/06/16 0440 04/07/16 0243 04/07/16 0245 04/08/16 0425 04/08/16 2009  WBC 10.1  --  11.4*  --   --  10.7*  --  16.6*  --  11.1*  --   CREATININE 0.91  --   --  0.82 0.75  --  0.79  --  0.73 0.71  --   VANCOTROUGH  --  11*  --   --   --   --   --   --   --   --  13*    Estimated Creatinine Clearance: 41 mL/min (by C-G formula based on SCr of 0.71 mg/dL).    Allergies  Allergen Reactions  . Celexa [Citalopram Hydrobromide] Other (See Comments)    "Jerky movements"  . Nickel     Unknown reaction  . Tramadol Hcl Nausea And Vomiting   Cipro 1/18 >> (1/27) Cefepime 1/14 >> 1/18 Vancomycin 1/14 >> (1/27) Levaquin 1/15 >> 1/17  1/18 vanc trough = 11 on 500 q24hrs 1/22 vanc trough = 13 on 750 q 24 hrs  1/14 BCx: neg 1.15 UCx: ngtd 1/15 Sputum: MRSA, pseudomonas 1/16 TA: pseudomonas 1/15 MRSA PCR: positive 1/21 > TA 1/22 BCx x 2  Uvaldo Rising, BCPS  Clinical Pharmacist Pager [phone]  04/08/2016 8:56 PM

## 2016-04-09 LAB — RENAL FUNCTION PANEL
ALBUMIN: 1.8 g/dL — AB (ref 3.5–5.0)
ANION GAP: 3 — AB (ref 5–15)
BUN: 21 mg/dL — ABNORMAL HIGH (ref 6–20)
CALCIUM: 8.5 mg/dL — AB (ref 8.9–10.3)
CO2: 35 mmol/L — ABNORMAL HIGH (ref 22–32)
Chloride: 103 mmol/L (ref 101–111)
Creatinine, Ser: 0.67 mg/dL (ref 0.44–1.00)
Glucose, Bld: 79 mg/dL (ref 65–99)
PHOSPHORUS: 3.1 mg/dL (ref 2.5–4.6)
POTASSIUM: 4.1 mmol/L (ref 3.5–5.1)
SODIUM: 141 mmol/L (ref 135–145)

## 2016-04-09 LAB — CBC
HEMATOCRIT: 24.2 % — AB (ref 36.0–46.0)
Hemoglobin: 7.4 g/dL — ABNORMAL LOW (ref 12.0–15.0)
MCH: 28.6 pg (ref 26.0–34.0)
MCHC: 30.6 g/dL (ref 30.0–36.0)
MCV: 93.4 fL (ref 78.0–100.0)
Platelets: 153 10*3/uL (ref 150–400)
RBC: 2.59 MIL/uL — ABNORMAL LOW (ref 3.87–5.11)
RDW: 16.7 % — ABNORMAL HIGH (ref 11.5–15.5)
WBC: 14.2 10*3/uL — AB (ref 4.0–10.5)

## 2016-04-09 LAB — CULTURE, RESPIRATORY: CULTURE: NORMAL

## 2016-04-09 LAB — PROTIME-INR
INR: 1.12
PROTHROMBIN TIME: 14.5 s (ref 11.4–15.2)

## 2016-04-09 LAB — GLUCOSE, CAPILLARY: GLUCOSE-CAPILLARY: 72 mg/dL (ref 65–99)

## 2016-04-09 LAB — MAGNESIUM: Magnesium: 2.3 mg/dL (ref 1.7–2.4)

## 2016-04-09 MED ORDER — METOPROLOL TARTRATE 5 MG/5ML IV SOLN: 10.0000 mg | Freq: Two times a day (BID) | INTRAVENOUS | Status: DC

## 2016-04-09 MED ORDER — METOPROLOL TARTRATE 5 MG/5ML IV SOLN
5.0000 mg | Freq: Four times a day (QID) | INTRAVENOUS | Status: DC
  Administered 2016-04-09 – 2016-04-12 (×11): 5 mg via INTRAVENOUS
  Filled 2016-04-09 (×15): qty 5

## 2016-04-09 NOTE — Progress Notes (Signed)
Speech Language Pathology Treatment: Dysphagia  Patient Details Name: [name] MRN: [id number] DOB: 1945-12-17 Today's Date: 04/09/2016 Time: [id number] SLP Time Calculation (min) (ACUTE ONLY): 10 min  Assessment / Plan / Recommendation Clinical Impression  Skilled treatment session focused on dysphagia goals. SLP facilitated session by providing Max A multimodal cues for deep breathing to increase O2 stats to low 90% on 4 liters O2 via [place]. Pt given 5 trials of ice chips with increased risk for aspiration given aphonia, lengthy intubation, shallow respirations and cough at baseline. Pt continued to required Max A multimodal cues for effortful swallow and deep breathing. Education provided to nursing on high aspiration risk. family/MD is considering comfort support, I would recommend minimal ice chips following oral care and strict observation for any respiratory compromise. If family/MD is considering more advanced diet, pt will need Modified Barium Swallow Study prior to further upgrade. Education provided to nursing, who will consult MD.    HPI HPI: [name] transferred from [facility] ED with septic shock  due to staph areus thought to be due to osteomyelitis of Lt second toe - work up underway.  Pt was intubated 03/27/16 >>03/29/16.  Pt with Lt intermittent Lt sided weakness upon presentation - Head CT without acute changes.  PMH includes:  CLL, DM, HTN, and h/o falls per H&P she was a "frequent flyer" in ED typically for falls. Pt reintubated on 04/02/16 and extubated on 04/08/16.          SLP Plan  Continue with current plan of care     Recommendations  Diet recommendations: NPO (Ice chips after oral care) Liquids provided via: Teaspoon Medication Administration: Crushed with puree Supervision: Full supervision/cueing for compensatory strategies;Staff to assist with self feeding Compensations: Small sips/bites;Slow rate Postural Changes and/or Swallow Maneuvers: Seated upright  90 degrees                Oral Care Recommendations: Oral care QID Follow up Recommendations:  (TBD) Plan: Continue with current plan of care       GO             Stella Encarnacion B. Rutherford Nail, M.S., CCC-SLP Speech-Language Pathologist    Damonique Brunelle 04/09/2016, 12:10 PM

## 2016-04-09 NOTE — Progress Notes (Signed)
PULMONARY / CRITICAL CARE MEDICINE   Name: [name] MRN: [id number] DOB: [birth year]    ADMISSION DATE:  03/31/2016 CONSULTATION DATE:  1/16  REFERRING MD:  Dr. [place] Pink  HISTORY OF PRESENT ILLNESS: :  71 year old with history COPD on home O2, diastolic CHF, atrial fibrillation on full dose aspirin, protein calorie malnutrition, and recent admission (12/2015) for MRSA pneumonia requiring mechanical ventilation presented with generalized weakness, worsening cough productive of thick yellow sputum. Since her recent admissions she has never fully regained her strength and has been limited in her function at home, and has had a very poor appetite. Chest x-ray concerning for left upper lobe infiltrate. She was admitted to the hospitalist service for treatment of HCAP with broad-spectrum antibiotics and COPD exacerbation. Hospital course complicated by progressive lethargy, which progressed to near unresponsiveness on the morning of 1/16. ABG at that time significant for pH 7.16. PCCM was consulted for further evaluation.   SIGNIFICANT EVENTS: 1/14 Admit for MRSA PNA on vent. Sputum with MRSA and psedumononas 1/16 ETT 1/16>>1/22 1/16 L IJ CVL 1/16>> 1/16 sputum - pseudomonas again 1/22 Fam decided on DNR   SUBJECTIVE/OVERNIGHT/INTERVAL HX Extubated yesterday. No acute events overnight. Doing well this morning.   VITAL SIGNS: BP 126/60   Pulse 88   Temp 97.9 F (36.6 C) (Oral)   Resp (!) 24   Ht 4\' 10"  (1.473 m)   Wt 95 lb 3.8 oz (43.2 kg)   SpO2 100%   BMI 19.90 kg/m    INTAKE / OUTPUT: I/O last 3 completed shifts: In: 3821.7 [I.V.:764.8; NG/GT:1440; IV Piggyback:1616.9] Out: 2395 [Urine:2395]  PHYSICAL EXAMINATION: General:  Frail, chronically ill appearing woman, NAD  Neuro:  Follows commands. Moves all 4s.  HEENT:  [place]/AT, PERRL, no JVD Cardiovascular: RRR, no m/g/r  Lungs:  resps even non labored, few rhonchi, diminished throughout  Abdomen:  BS+, soft, non-tender,  non-distended Musculoskeletal:  No acute deformity Skin:  Multiple BLE wounds  LABS:  PULMONARY  Recent Labs Lab 04/02/16 0854 04/02/16 1225 04/03/16 1215  PHART 7.145* 7.351 7.468*  PCO2ART ABOVE REPORTABLE RANGE 63.8* 45.4  PO2ART 88.3 199* 64.0*  HCO3 39.6* 34.5* 32.9*  TCO2  --   --  34  O2SAT 95.9 99.4 93.0    CBC  Recent Labs Lab 04/07/16 0243 04/08/16 0425 04/09/16 0330  HGB 8.2* 7.1* 7.4*  HCT 26.9* 23.1* 24.2*  WBC 16.6* 11.1* 14.2*  PLT 128* 121* 153     CHEMISTRY  Recent Labs Lab 04/05/16 0354  04/05/16 2009 04/06/16 0439 04/06/16 0440 04/07/16 0243 04/07/16 0245 04/08/16 0425 04/09/16 0330  NA  --   < > 138  --  139  --  140 141 141  K  --   < > 3.9  --  3.6  --  3.5 2.7* 4.1  CL  --   < > 98*  --  99*  --  99* 100* 103  CO2  --   < > 34*  --  34*  --  35* 36* 35*  GLUCOSE  --   < > 115*  --  234*  --  142* 171* 79  BUN  --   < > 26*  --  26*  --  26* 25* 21*  CREATININE  --   < > 0.75  --  0.79  --  0.73 0.71 0.67  CALCIUM  --   < > 7.6*  --  7.7*  --  8.2* 7.8* 8.5*  MG 2.7*  --   --  1.3*  --  2.0  --  1.3* 2.3  PHOS  --   < > 2.2* 4.0 4.1  --  2.0* 1.9* 3.1  < > = values in this interval not displayed. Estimated Creatinine Clearance: 41 mL/min (by C-G formula based on SCr of 0.67 mg/dL).   LIVER  Recent Labs Lab 04/05/16 0355 04/06/16 0440 04/07/16 0245 04/08/16 0425 04/09/16 0330  ALBUMIN 1.7* 1.7* 1.8* 1.6* 1.8*     INFECTIOUS  Recent Labs Lab 04/02/16 1320 04/03/16 0415 04/04/16 0505  PROCALCITON 0.24 0.18 0.13     ENDOCRINE CBG (last 3)   Recent Labs  04/08/16 1943 04/08/16 2345 04/09/16 0316  GLUCAP 76 84 72      IMAGING Dg Chest Port 1 View  Result Date: 04/08/2016 CLINICAL DATA:  Hypoxia EXAM: PORTABLE CHEST 1 VIEW COMPARISON:  April 07, 2016 FINDINGS: Endotracheal tube tip is 3.8 cm above the carina. Central catheter tip is in the superior vena cava. Nasogastric tube tip and side port below  the diaphragm. No pneumothorax. There is scarring with volume loss in both upper lobes, stable. Pleural effusions bilaterally remain stable with what appears to be a degree of loculated effusion on the right. There is patchy atelectasis in both lung bases. Heart size is within normal limits. The pulmonary vascularity is stable, distorted from the upper lobe cicatrization. There is aortic atherosclerosis. No evident adenopathy. No bone lesions peer IMPRESSION: Tube and catheter positions as described without pneumothorax. Extensive upper lobe scarring remains. Bilateral pleural effusions with bibasilar atelectasis, stable. No new opacity. Stable cardiac silhouette. Aortic atherosclerosis. Electronically Signed   By: Lowella Grip III M.D.   On: 04/08/2016 07:44   Dg Chest Port 1 View  Result Date: 04/07/2016 CLINICAL DATA:  Healthcare associated pneumonia. Repositioning of ET tube. EXAM: PORTABLE CHEST 1 VIEW COMPARISON:  04/07/2016 FINDINGS: The endotracheal tube tip is positioned 2.9 cm above the carina. Left IJ catheter tip is in the cavoatrial junction. There is a nasogastric tube with tip well below the GE junction. Heart size is normal. There are bilateral pleural effusions right greater in left. Similar appearance of right lung base opacity. Extensive bilateral upper lobe scarring is unchanged. IMPRESSION: ET tube tip is now well positioned above the carina. No change in bilateral pleural effusions and right lung base opacity Electronically Signed   By: Kerby Moors M.D.   On: 04/07/2016 11:42       STUDIES:  1/22 Venous duplex>> negative for DVT  CULTURES: BCx 1/14 >>> no growth  Sputum 1/14 >>> moderate pseudomonas, MRSA Urine 1/14 >>> no growth Resp cx 1/16>> pseudomonas Resp cx 1/121>> BCx 1/22>>  ANTIBIOTICS: Cefepime 1/14>>1/18 Levaquin 1/14>>1/17 Vanco 1/14 >>1/28 Cipro 1/18>>1/28  ASSESSMENT / PLAN:  71 year old female with severe COPD on home O2 with prolonged  admission 12/2015 for MRSA PNA requiring vent.  Since then has been frail, weak, poor functional status at home per family.  Again aditted 1/14 by [facility] with HCAP and AECOPD.  Had worsening respiratory and mental status, failed bipap and tx ICU for intubation.   ASSESSMENT / PLAN:  PULMONARY Acute on chronic hypercarbic respiratory failure - r/t AECOPD, HCAP.   HCAP - MRSA, pseudomonas. Recent MRSA PNA in Oct 2017 AECOPD   PLAN -  Supplemental O2 prn O2 sat D/c Solu-medrol 40 mg daily Scheduled duonebs Abx as below  CARDIOVASCULAR Sepsis secondary to HCAP - resolved.  Chronic dCHF  AFib   PLAN -  Continue metoprolol 25mg  BID Not  chronically anticoagulated  ASA  RENAL  Recent Labs Lab 04/05/16 2009 04/06/16 0440 04/07/16 0245 04/08/16 0425 04/09/16 0330  CREATININE 0.75 0.79 0.73 0.71 0.67   Hypophos, Hypokalemia, Hypomag> resolved  PLAN -  Follow BMP  GASTROINTESTINAL Protein calorie malnutrition   PLAN -  D/c pepcid SLP eval  HEMATOLOGIC  Recent Labs Lab 04/07/16 0243 04/08/16 0425 04/09/16 0330  HGB 8.2* 7.1* 7.4*  HCT 26.9* 23.1* 24.2*  WBC 16.6* 11.1* 14.2*  PLT 128* 121* 153  Anemia - likely chronic. No obvious s/s bleeding. S/p 1 unit PRBC 1/17 Thrombocytopenia- resolved  PLAN -  Trend CBC   INFECTIOUS HCAP - staph, pseudomonas. Recent MRSA  PLAN -  Cont vanc, cipro (10 day course) Trend fever and WBC count curves  ENDOCRINE Hyperglycemia - mild. Likely steroid related. No hx DM > resolved  PLAN -  Continue to monitor  NEUROLOGIC AMS - r/t hypercarbia. Improving.   PLAN -  RASS goal: 0  ORTHO:  Bilateral leg wounds  - nonhealing ulcerations, recent imaging without cause for concern. Fracture of the right greater trochanter   PLAN -  - Family denied orthopedics consult  - Wound care following    Family meeting 1/21-  Have agreed to DNR.     Jacques Earthly, MD Internal Medicine Resident, PGY-III Pager:  [phone] 04/09/2016 10:38 AM

## 2016-04-09 NOTE — Progress Notes (Signed)
Discussed with [facility] this afternoon. [facility] to resume care in AM.   Jacques Earthly, MD  Internal Medicine PGY-3 04/09/16 2:36 PM

## 2016-04-09 NOTE — Evaluation (Signed)
Clinical/Bedside Swallow Evaluation Patient Details  Name: [name] MRN: [id number] Date of Birth: 10/23/1945  Today's Date: 04/09/2016 Time: SLP Start Time (ACUTE ONLY): 1120 SLP Stop Time (ACUTE ONLY): 1135 SLP Time Calculation (min) (ACUTE ONLY): 15 min  Past Medical History:  Past Medical History:  Diagnosis Date  . Anemia   . Anxiety   . Anxiety disorder   . Arthritis    "pretty much all over" (01/05/2016)  . Asthma   . Atrial fibrillation ([place])   . Avascular necrosis of hip ([place])    left  . CHF (congestive heart failure) ([place])   . Chronic bronchitis ([place])   . Chronic lower back pain   . COPD (chronic obstructive pulmonary disease) ([place])   . Depression   . ETOH abuse   . Heart murmur    "think I outgrew it" (01/05/2016)  . High cholesterol   . History of blood transfusion 12/2015   "while in ICU I think" (01/05/2016)  . HTN (hypertension)   . Hyperglycemia   . On home O2    "2.5-3L 24/7" (01/05/2016)  . Pneumonia    "several times"  . Psoriatic arthritis ([place])    "we give her injections of Humera twice/month unless she's ill; think the last one she had was 08/23/2015" (01/05/2016)   Past Surgical History:  Past Surgical History:  Procedure Laterality Date  . JOINT REPLACEMENT    . TONSILLECTOMY AND ADENOIDECTOMY    . TOTAL HIP ARTHROPLASTY Right 05/15/2008  . TUBAL LIGATION     HPI:  [name] transferred from [facility] ED with septic shock  due to staph areus thought to be due to osteomyelitis of Lt second toe - work up underway.  Pt was intubated 03/27/16 >>03/29/16.  Pt with Lt intermittent Lt sided weakness upon presentation - Head CT without acute changes.  PMH includes:  CLL, DM, HTN, and h/o falls per H&P she was a "frequent flyer" in ED typically for falls. Pt reintubated on 04/02/16 and extubated on 04/08/16.       Assessment / Plan / Recommendation Clinical Impression  Pt is at severely high risk for aspiration given lengthy intubation,  aphonic voice, shallow respirations, difficulty maintaining stats (at baseline she is in low 90's), weak cough present at baseline and decreased mentation. At rest, pt with audible rasp on in halation and exhalation. Pt required Max A multimodal cues for deep breathing to get 02 within mid 90's. Pt given 5 trials of ice chips with O2 fluctuation to 88% briefly and same cough present that was present at baseline. Difficult to asses if cough is related to intake. If family/MD is considering comfort support, I would recommend minimal ice chips following oral care and strict observation for any respiratory compromise. If family/MD is considering more advanced diet, pt will need Modified Barium Swallow Study prior to further upgrade. Education provided to nursing, who will consult MD.     Aspiration Risk  Severe aspiration risk;Risk for inadequate nutrition/hydration    Diet Recommendation NPO except meds;Ice chips PRN after oral care   Liquid Administration via: Spoon Medication Administration: Crushed with puree (As she tolerates) Supervision: Full supervision/cueing for compensatory strategies Compensations: Small sips/bites;Slow rate Postural Changes: Seated upright at 90 degrees    Other  Recommendations Oral Care Recommendations: Oral care QID Other Recommendations: Remove water pitcher   Follow up Recommendations  (TBD)      Frequency and Duration min 2x/week  2 weeks  Prognosis Prognosis for Safe Diet Advancement: Guarded Barriers to Reach Goals: Cognitive deficits;Time post onset;Severity of deficits      Swallow Study   General Date of Onset: 03/31/16 HPI: [name] transferred from [facility] ED with septic shock  due to staph areus thought to be due to osteomyelitis of Lt second toe - work up underway.  Pt was intubated 03/27/16 >>03/29/16.  Pt with Lt intermittent Lt sided weakness upon presentation - Head CT without acute changes.  PMH includes:  CLL, DM, HTN, and h/o  falls per H&P she was a "frequent flyer" in ED typically for falls. Pt reintubated on 04/02/16 and extubated on 04/08/16.     Type of Study: Bedside Swallow Evaluation Previous Swallow Assessment:  (None in chart) Diet Prior to this Study: NPO Temperature Spikes Noted: No Respiratory Status: Nasal cannula (4liters) History of Recent Intubation: Yes Length of Intubations (days): 6 days Date extubated: 04/08/16 Behavior/Cognition: Alert;Cooperative;Pleasant mood Oral Cavity Assessment: Within Functional Limits Oral Care Completed by SLP: Recent completion by staff Oral Cavity - Dentition: Missing dentition Vision:  (N/A) Self-Feeding Abilities: Total assist Patient Positioning: Upright in bed Baseline Vocal Quality: Aphonic;Low vocal intensity Volitional Cough: Weak Volitional Swallow: Unable to elicit    Oral/Motor/Sensory Function Overall Oral Motor/Sensory Function: Within functional limits   Ice Chips Ice chips: Within functional limits Presentation: Spoon Other Comments: Guarded, no overt s/s of aspiration but is at high risk given length of intubation, respiratory weakness   Thin Liquid Thin Liquid: Not tested    Nectar Thick Nectar Thick Liquid: Not tested   Honey Thick Honey Thick Liquid: Not tested   Puree Puree: Not tested   Solid   GO   Solid: Not tested       Mohogany Toppins B. Rutherford Nail, M.S., [place] 04/09/2016,12:08 PM

## 2016-04-09 NOTE — Progress Notes (Signed)
Nutrition Follow-up  DOCUMENTATION CODES:   Underweight, Severe malnutrition in context of chronic illness  INTERVENTION:    Diet advancement per SLP/MD as able.  NUTRITION DIAGNOSIS:   Malnutrition related to chronic illness as evidenced by severe depletion of muscle mass, severe depletion of body fat.  Ongoing  GOAL:   Patient will meet greater than or equal to 90% of their needs  Unmet  MONITOR:   Diet advancement, PO intake, Skin  ASSESSMENT:    71 y.o. female with medical history significant for COPD with chronic respiratory failure, hypertension, depression, anxiety, chronic diastolic CHF, and severe protein calorie malnutrition who presents to the emergency department for evaluation of productive cough, generalized weakness, loss of appetite, and lethargy.  Patient was made DNR and extubated on 1/22. Remains NPO. SLP following for ability to complete swallow evaluation and advance diet. Nutrition-Focused physical exam completed. Findings are severe fat depletion, severe muscle depletion, and no edema.  Patient with severe PCM.  Diet Order:  Diet NPO time specified Except for: Sips with Meds, Ice Chips  Skin:  Wound (see comment) (Stg IV L leg, Stg IV R heel, Stg 1 sacrum)  Last BM:  1/22  Height:   Ht Readings from Last 1 Encounters:  04/02/16 4\' 10"  (1.473 m)    Weight:   Wt Readings from Last 1 Encounters:  04/09/16 95 lb 3.8 oz (43.2 kg)    Ideal Body Weight:  43.9 kg  BMI:  Body mass index is 19.9 kg/m.  Estimated Nutritional Needs:   Kcal:  1250-1450  Protein:  75-90 grams  Fluid:  1.8 L  EDUCATION NEEDS:   No education needs identified at this time  Molli Barrows, [place], [place], [place] Pager [phone] After Hours Pager [phone]

## 2016-04-09 NOTE — Care Management Note (Addendum)
Case Management Note  Patient Details  Name: RAYNIYAH OVERTURF MRN: [id number] Date of Birth: 05-19-1945  Subjective/Objective:    Pt admitted for sepsis and PNA - transferred to 5M from floor and intubated 04/02/16                 Action/Plan:  PTA from home with husband with pressure ulcer.  CM will continue to follow for discharge needs   Expected Discharge Date:                  Expected Discharge Plan:     In-House Referral:     Discharge planning Services  CM Consult  Post Acute Care Choice:    Choice offered to:     DME Arranged:    DME Agency:     HH Arranged:    HH Agency:     Status of Service:  In process, will continue to follow  If discussed at Long Length of Stay Meetings, dates discussed:    Additional Comments: 04/09/2016 Discussed in LOS 04/09/16 - pt remains appropriate for continued stay.  Pt successful one way extubated yesterday, on continuous Precedex tube feeds, IV antibiotics.  [place] meeting are on going - plan is not to reintubate however to continue with current care.  PT eval ordered - probable discharge to SNF Maryclare Labrador, RN 04/09/2016, 9:47 AM

## 2016-04-10 ENCOUNTER — Ambulatory Visit (HOSPITAL_COMMUNITY): Payer: Medicare Other

## 2016-04-10 ENCOUNTER — Inpatient Hospital Stay (HOSPITAL_COMMUNITY): Payer: Medicare Other

## 2016-04-10 DIAGNOSIS — I10 Essential (primary) hypertension: Secondary | ICD-10-CM

## 2016-04-10 DIAGNOSIS — J430 Unilateral pulmonary emphysema [MacLeod's syndrome]: Secondary | ICD-10-CM

## 2016-04-10 LAB — BASIC METABOLIC PANEL
Anion gap: 7 (ref 5–15)
BUN: 16 mg/dL (ref 6–20)
CO2: 32 mmol/L (ref 22–32)
Calcium: 9 mg/dL (ref 8.9–10.3)
Chloride: 100 mmol/L — ABNORMAL LOW (ref 101–111)
Creatinine, Ser: 0.72 mg/dL (ref 0.44–1.00)
GFR calc Af Amer: >60 mL/min (ref >60)
GLUCOSE: 84 mg/dL (ref 65–99)
POTASSIUM: 3.9 mmol/L (ref 3.5–5.1)
Sodium: 139 mmol/L (ref 135–145)

## 2016-04-10 LAB — CBC
HEMATOCRIT: 25.7 % — AB (ref 36.0–46.0)
Hemoglobin: 7.7 g/dL — ABNORMAL LOW (ref 12.0–15.0)
MCH: 27.8 pg (ref 26.0–34.0)
MCHC: 30 g/dL (ref 30.0–36.0)
MCV: 92.8 fL (ref 78.0–100.0)
Platelets: 289 10*3/uL (ref 150–400)
RBC: 2.77 MIL/uL — AB (ref 3.87–5.11)
RDW: 16.1 % — AB (ref 11.5–15.5)
WBC: 16.3 10*3/uL — AB (ref 4.0–10.5)

## 2016-04-10 MED ORDER — CIPROFLOXACIN IN D5W 400 MG/200ML IV SOLN
400.0000 mg | Freq: Two times a day (BID) | INTRAVENOUS | Status: DC
  Administered 2016-04-10 – 2016-04-12 (×5): 400 mg via INTRAVENOUS
  Filled 2016-04-10 (×4): qty 200

## 2016-04-10 NOTE — Progress Notes (Signed)
[facility]  PROGRESS NOTE  [name] [id number] DOB: 25-Aug-1945 DOA: 03/31/2016 PCP: Chevis Pretty, FNP   Brief HPI:    71 year old with history COPD on home O2, diastolic CHF, atrial fibrillation on full dose aspirin, protein calorie malnutrition, and recent admission (12/2015) for MRSA pneumonia requiring mechanical ventilation presented with generalized weakness, worsening cough productive of thick yellow sputum. Since her recent admissions she has never fully regained her strength and has been limited in her function at home, and has had a very poor appetite. Chest x-ray concerning for left upper lobe infiltrate. She was admitted to the hospitalist service for treatment of HCAP with broad-spectrum antibiotics and COPD exacerbation. Hospital course complicated by progressive lethargy, which progressed to near unresponsiveness on the morning of 1/16. ABG at that time significant for pH 7.16. PCCM was consulted,  patient was intubated from 04/02/1818 04/08/2016.   Subjective   Patient seen and examined, continues to be lethargic   Assessment/Plan:     1. Acute on chronic hypercarbic respiratory failure- secondary to COPD, healthcare associated pneumonia. Now extubated. Continue oxygen. An, sodium and discontinued. Scheduled nebs 2. Healthcare associated pneumonia-  sputum culture growing staphylococci, Pseudomonas, with recent history of MRSA. Patient currently on vancomycin and ciprofloxacin for 10 day course. Stop on 04/14/2016. 3. Atrial fibrillation- continue metoprolol 25 mg by mouth twice a day, she is not chronically" attributed continue aspirin 4. Dysphagia- we'll obtain swallow evaluation, and diet recommendations. 5. Encephalopathy- likely from hypercarbia, slowly improving. 6. Right hip pain- patient has been evaluated by PCP, infected total right greater trochanter. Per PCP notes patient and family declined orthopedics consultation, this was confirmed by  the admitting physician at the time of admission on 03/31/2016. 7. Bilateral leg wounds- patient has nonhealing ulcers in bilateral lower extremities. Wound care consulted. 8. Normocytic anemia-chronic, hemoglobin was 9.1 on admission/FOBT negative. Status post 1 unit of PRBC on 04/03/2016. Hemoglobin is 7.7 today.B12, folate, ferritin, and iron-saturation normal in August 2017     DVT prophylaxis: *Heparin  Code Status: DO NOT RESUSCITATE  Family Communication: No family present at bedside   Disposition Plan: *To be decided   Consultants:  PCCM    Procedures: 1/14 Admit for MRSA PNA on vent. Sputum with MRSA and psedumononas 1/16 ETT 1/16>>1/22 1/16 L IJ CVL 1/16>> 1/16 sputum - pseudomonas 1/22 Fam decided on DNR    Antibiotics:   Anti-infectives    Start     Dose/Rate Route Frequency Ordered Stop   04/10/16 1400  ciprofloxacin (CIPRO) IVPB 400 mg     400 mg 200 mL/hr over 60 Minutes Intravenous Every 12 hours 04/10/16 1309 04/14/16 1359   04/09/16 1800  vancomycin (VANCOCIN) IVPB 1000 mg/200 mL premix     1,000 mg 200 mL/hr over 60 Minutes Intravenous Every 24 hours 04/08/16 2052 04/14/16 1759   04/04/16 2100  vancomycin (VANCOCIN) IVPB 750 mg/150 ml premix  Status:  Discontinued     750 mg 150 mL/hr over 60 Minutes Intravenous Every 24 hours 04/04/16 2009 04/08/16 2052   04/04/16 1300  ciprofloxacin (CIPRO) IVPB 400 mg  Status:  Discontinued     400 mg 200 mL/hr over 60 Minutes Intravenous Every 12 hours 04/04/16 1220 04/10/16 1309   04/03/16 1400  ceFEPIme (MAXIPIME) 2 g in dextrose 5 % 50 mL IVPB  Status:  Discontinued     2 g 100 mL/hr over 30 Minutes Intravenous Every 12 hours 04/03/16 1328 04/04/16 1220   04/02/16 0000  ceFEPIme (MAXIPIME) 2  g in dextrose 5 % 50 mL IVPB  Status:  Discontinued     2 g 100 mL/hr over 30 Minutes Intravenous Every 24 hours 04/01/16 0002 04/03/16 1328   04/01/16 2000  vancomycin (VANCOCIN) 500 mg in sodium chloride 0.9 % 100 mL  IVPB  Status:  Discontinued     500 mg 100 mL/hr over 60 Minutes Intravenous Every 24 hours 03/31/16 2214 04/04/16 2009   04/01/16 0600  piperacillin-tazobactam (ZOSYN) IVPB 3.375 g  Status:  Discontinued     3.375 g 12.5 mL/hr over 240 Minutes Intravenous Every 8 hours 03/31/16 2214 03/31/16 2357   04/01/16 0300  levofloxacin (LEVAQUIN) IVPB 500 mg  Status:  Discontinued     500 mg 100 mL/hr over 60 Minutes Intravenous Every 48 hours 04/01/16 0222 04/03/16 1124   04/01/16 0000  ceFEPIme (MAXIPIME) 2 g in dextrose 5 % 50 mL IVPB     2 g 100 mL/hr over 30 Minutes Intravenous  Once 03/31/16 2357 04/01/16 0111   03/31/16 1915  piperacillin-tazobactam (ZOSYN) IVPB 3.375 g     3.375 g 100 mL/hr over 30 Minutes Intravenous  Once 03/31/16 1906 03/31/16 2059   03/31/16 1915  vancomycin (VANCOCIN) IVPB 1000 mg/200 mL premix     1,000 mg 200 mL/hr over 60 Minutes Intravenous  Once 03/31/16 1906 03/31/16 2059       Objective   Vitals:   04/10/16 0510 04/10/16 0853 04/10/16 1027 04/10/16 1428  BP: (!) 142/74  110/78   Pulse: 90  (!) 110   Resp: 19  20   Temp: 97.7 F (36.5 C)  98 F (36.7 C)   TempSrc: Oral  Oral   SpO2: 96% 96% 98% 97%  Weight:      Height:        Intake/Output Summary (Last 24 hours) at 04/10/16 1613 Last data filed at 04/10/16 1300  Gross per 24 hour  Intake              520 ml  Output             1070 ml  Net             -550 ml   Filed Weights   04/08/16 0427 04/09/16 0323 04/09/16 2224  Weight: 42.2 kg (93 lb 0.6 oz) 43.2 kg (95 lb 3.8 oz) 43.6 kg (96 lb 1.9 oz)     Physical Examination:  General exam: Appears lethargic Respiratory system: Clear to auscultation. Respiratory effort normal. Cardiovascular system:  RRR. No  murmurs, rubs, gallops. No pedal edema. GI system: Abdomen is nondistended, soft and nontender. No organomegaly.  Central nervous system. No focal neurological deficits. 5 x 5 power in all extremities. Skin: No rashes, lesions or  ulcers. Psychiatry: Alert, oriented x 3.Judgement and insight appear normal. Affect normal.    Data Reviewed: I have personally reviewed following labs and imaging studies  CBG:  Recent Labs Lab 04/08/16 1212 04/08/16 1631 04/08/16 1943 04/08/16 2345 04/09/16 0316  GLUCAP 122* 189* 76 84 72    CBC:  Recent Labs Lab 04/04/16 0505 04/05/16 0354 04/06/16 0439 04/07/16 0243 04/08/16 0425 04/09/16 0330 04/10/16 0640  WBC 10.1 11.4* 10.7* 16.6* 11.1* 14.2* 16.3*  NEUTROABS 9.3* 10.5* 10.0* 15.8* 9.6*  --   --   HGB 8.8* 8.7* 7.9* 8.2* 7.1* 7.4* 7.7*  HCT 28.2* 27.7* 25.9* 26.9* 23.1* 24.2* 25.7*  MCV 90.1 89.1 91.2 91.8 92.4 93.4 92.8  PLT 134* 117* 93* 128* 121* 153 289  Basic Metabolic Panel:  Recent Labs Lab 04/05/16 0354  04/06/16 0439 04/06/16 0440 04/07/16 0243 04/07/16 0245 04/08/16 0425 04/09/16 0330 04/10/16 0640  NA  --   < >  --  139  --  140 141 141 139  K  --   < >  --  3.6  --  3.5 2.7* 4.1 3.9  CL  --   < >  --  99*  --  99* 100* 103 100*  CO2  --   < >  --  34*  --  35* 36* 35* 32  GLUCOSE  --   < >  --  234*  --  142* 171* 79 84  BUN  --   < >  --  26*  --  26* 25* 21* 16  CREATININE  --   < >  --  0.79  --  0.73 0.71 0.67 0.72  CALCIUM  --   < >  --  7.7*  --  8.2* 7.8* 8.5* 9.0  MG 2.7*  --  1.3*  --  2.0  --  1.3* 2.3  --   PHOS  --   < > 4.0 4.1  --  2.0* 1.9* 3.1  --   < > = values in this interval not displayed.  Recent Results (from the past 240 hour(s))  Blood Culture (routine x 2)     Status: None   Collection Time: 03/31/16  7:30 PM  Result Value Ref Range Status   Specimen Description BLOOD LEFT HAND  Final   Special Requests BOTTLES DRAWN AEROBIC AND ANAEROBIC 5CC  Final   Culture NO GROWTH 5 DAYS  Final   Report Status 04/05/2016 FINAL  Final  Blood Culture (routine x 2)     Status: None   Collection Time: 03/31/16  7:30 PM  Result Value Ref Range Status   Specimen Description LEFT ANTECUBITAL  Final   Special  Requests BOTTLES DRAWN AEROBIC AND ANAEROBIC 5CC  Final   Culture NO GROWTH 5 DAYS  Final   Report Status 04/05/2016 FINAL  Final  Urine culture     Status: None   Collection Time: 03/31/16  7:50 PM  Result Value Ref Range Status   Specimen Description URINE, CATHETERIZED  Final   Special Requests NONE  Final   Culture NO GROWTH  Final   Report Status 04/02/2016 FINAL  Final  Culture, sputum-assessment     Status: None   Collection Time: 04/01/16  5:00 AM  Result Value Ref Range Status   Specimen Description SPUTUM  Final   Special Requests Immunocompromised  Final   Sputum evaluation THIS SPECIMEN IS ACCEPTABLE FOR SPUTUM CULTURE  Final   Report Status 04/01/2016 FINAL  Final  Culture, respiratory (NON-Expectorated)     Status: None   Collection Time: 04/01/16  5:00 AM  Result Value Ref Range Status   Specimen Description SPUTUM  Final   Special Requests Immunocompromised Reflexed from [id number]  Final   Gram Stain   Final    ABUNDANT WBC PRESENT, PREDOMINANTLY PMN ABUNDANT GRAM POSITIVE RODS MODERATE GRAM NEGATIVE RODS FEW YEAST    Culture   Final    MODERATE PSEUDOMONAS AERUGINOSA MODERATE METHICILLIN RESISTANT STAPHYLOCOCCUS AUREUS    Report Status 04/04/2016 FINAL  Final   Organism ID, Bacteria PSEUDOMONAS AERUGINOSA  Final   Organism ID, Bacteria METHICILLIN RESISTANT STAPHYLOCOCCUS AUREUS  Final      Susceptibility   Methicillin resistant staphylococcus aureus - MIC*  CIPROFLOXACIN >=8 RESISTANT Resistant     ERYTHROMYCIN >=8 RESISTANT Resistant     GENTAMICIN <=0.5 SENSITIVE Sensitive     OXACILLIN >=4 RESISTANT Resistant     TETRACYCLINE <=1 SENSITIVE Sensitive     VANCOMYCIN 1 SENSITIVE Sensitive     TRIMETH/SULFA <=10 SENSITIVE Sensitive     CLINDAMYCIN >=8 RESISTANT Resistant     RIFAMPIN <=0.5 SENSITIVE Sensitive     Inducible Clindamycin NEGATIVE Sensitive     * MODERATE METHICILLIN RESISTANT STAPHYLOCOCCUS AUREUS   Pseudomonas aeruginosa - MIC*     CEFTAZIDIME <=1 SENSITIVE Sensitive     CIPROFLOXACIN <=0.25 SENSITIVE Sensitive     GENTAMICIN <=1 SENSITIVE Sensitive     IMIPENEM 2 SENSITIVE Sensitive     PIP/TAZO <=4 SENSITIVE Sensitive     CEFEPIME <=1 SENSITIVE Sensitive     * MODERATE PSEUDOMONAS AERUGINOSA  MRSA PCR Screening     Status: Abnormal   Collection Time: 04/01/16  6:30 AM  Result Value Ref Range Status   MRSA by PCR POSITIVE (A) NEGATIVE Final    Comment:        The GeneXpert MRSA Assay (FDA approved for NASAL specimens only), is one component of a comprehensive MRSA colonization surveillance program. It is not intended to diagnose MRSA infection nor to guide or monitor treatment for MRSA infections. RESULT CALLED TO, READ BACK BY AND VERIFIED WITH: C ECKELMANN,RN AT 0827 04/01/16 BY L BENFIELD   Culture, respiratory (NON-Expectorated)     Status: None   Collection Time: 04/02/16 10:35 AM  Result Value Ref Range Status   Specimen Description TRACHEAL ASPIRATE  Final   Special Requests Normal  Final   Gram Stain   Final    MODERATE WBC PRESENT, PREDOMINANTLY PMN FEW GRAM NEGATIVE RODS    Culture FEW PSEUDOMONAS AERUGINOSA  Final   Report Status 04/04/2016 FINAL  Final   Organism ID, Bacteria PSEUDOMONAS AERUGINOSA  Final      Susceptibility   Pseudomonas aeruginosa - MIC*    CEFTAZIDIME 2 SENSITIVE Sensitive     CIPROFLOXACIN <=0.25 SENSITIVE Sensitive     GENTAMICIN <=1 SENSITIVE Sensitive     IMIPENEM 2 SENSITIVE Sensitive     PIP/TAZO <=4 SENSITIVE Sensitive     CEFEPIME <=1 SENSITIVE Sensitive     * FEW PSEUDOMONAS AERUGINOSA  Culture, expectorated sputum-assessment     Status: None   Collection Time: 04/07/16  3:53 PM  Result Value Ref Range Status   Specimen Description TRACHEAL ASPIRATE  Final   Special Requests NONE  Final   Sputum evaluation THIS SPECIMEN IS ACCEPTABLE FOR SPUTUM CULTURE  Final   Report Status 04/07/2016 FINAL  Final  Culture, respiratory (NON-Expectorated)     Status:  None   Collection Time: 04/07/16  3:53 PM  Result Value Ref Range Status   Specimen Description TRACHEAL ASPIRATE  Final   Special Requests NONE Reflexed from [id number]  Final   Gram Stain   Final    ABUNDANT WBC PRESENT, PREDOMINANTLY PMN NO ORGANISMS SEEN    Culture Consistent with normal respiratory flora.  Final   Report Status 04/09/2016 FINAL  Final  Culture, blood (Routine X 2) w Reflex to ID Panel     Status: None (Preliminary result)   Collection Time: 04/08/16  7:00 AM  Result Value Ref Range Status   Specimen Description BLOOD BLOOD RIGHT HAND  Final   Special Requests IN PEDIATRIC BOTTLE [place]  Final   Culture NO GROWTH 2 DAYS  Final  Report Status PENDING  Incomplete  Culture, blood (Routine X 2) w Reflex to ID Panel     Status: None (Preliminary result)   Collection Time: 04/08/16  7:12 AM  Result Value Ref Range Status   Specimen Description BLOOD BLOOD RIGHT ARM  Final   Special Requests IN PEDIATRIC BOTTLE 1CC  Final   Culture NO GROWTH 2 DAYS  Final   Report Status PENDING  Incomplete     Liver Function Tests:  Recent Labs Lab 04/05/16 0355 04/06/16 0440 04/07/16 0245 04/08/16 0425 04/09/16 0330  ALBUMIN 1.7* 1.7* 1.8* 1.6* 1.8*   No results for input(s): LIPASE, AMYLASE in the last 168 hours. No results for input(s): AMMONIA in the last 168 hours.  Cardiac Enzymes: No results for input(s): CKTOTAL, CKMB, CKMBINDEX, TROPONINI in the last 168 hours. BNP (last 3 results)  Recent Labs  10/15/15 1629 12/29/15 1905 04/01/16 0239  BNP 181.0* 222.0* 381.1*    ProBNP (last 3 results) No results for input(s): PROBNP in the last 8760 hours.    Studies: Dg Swallowing Func-speech Pathology  Result Date: 04/10/2016 Objective Swallowing Evaluation: Type of Study: MBS-Modified Barium Swallow Study Patient Details Name: [name] MRN: [id number] Date of Birth: 14-Sep-1945 Today's Date: 04/10/2016 Time: SLP Start Time (ACUTE ONLY): 1046-SLP Stop Time  (ACUTE ONLY): 1107 SLP Time Calculation (min) (ACUTE ONLY): 21 min Past Medical History: Past Medical History: Diagnosis Date . Anemia  . Anxiety  . Anxiety disorder  . Arthritis   "pretty much all over" (01/05/2016) . Asthma  . Atrial fibrillation ([place])  . Avascular necrosis of hip ([place])   left . CHF (congestive heart failure) ([place])  . Chronic bronchitis ([place])  . Chronic lower back pain  . COPD (chronic obstructive pulmonary disease) ([place])  . Depression  . ETOH abuse  . Heart murmur   "think I outgrew it" (01/05/2016) . High cholesterol  . History of blood transfusion 12/2015  "while in ICU I think" (01/05/2016) . HTN (hypertension)  . Hyperglycemia  . On home O2   "2.5-3L 24/7" (01/05/2016) . Pneumonia   "several times" . Psoriatic arthritis ([place])   "we give her injections of Humera twice/month unless she's ill; think the last one she had was 08/23/2015" (01/05/2016) Past Surgical History: Past Surgical History: Procedure Laterality Date . JOINT REPLACEMENT   . TONSILLECTOMY AND ADENOIDECTOMY   . TOTAL HIP ARTHROPLASTY Right 05/15/2008 . TUBAL LIGATION   HPI: [name] transferred from [facility] ED with septic shock due to staph areus thought to be due to osteomyelitis of Lt second toe - work up underway. Pt with intermittent Lt sided weakness upon presentation - Head CT without acute changes. PMH includes: CLL, DM, HTN, and h/o falls per H&P she was a "frequent flyer" in ED typically for falls. Pt reintubated on 04/02/16 and extubated on 04/08/16.  Subjective: alert, pleasant, very weak Assessment / Plan / Recommendation CHL IP CLINICAL IMPRESSIONS 04/10/2016 Therapy Diagnosis Mild oral phase dysphagia;Mild pharyngeal phase dysphagia Clinical Impression Mrs. Dickens exhibits mild oral dysphagia characterized by prolonged oral transit and oral holding during puree trial. Sensory based pharygneal dysphagia marked by delayed swallow initiation to the valleculae and silent penetration to the level of the vocal  cords during the swallow with liquids via straw. Pt's dementia prohibited attempting compensatory techniques. Throat clearing during study without observation of penetrates/aspirates. Due to the increased effort required to masticate and pt's current level of mentation, SLP recommends Dys 1 diet, thin liquids (no straw),  meds crushed in puree. Will continue to f/u re: pt's swallowing function, diet management/tolerance, and pt's safety/efficiency. Impact on safety and function Moderate aspiration risk   CHL IP TREATMENT RECOMMENDATION 04/10/2016 Treatment Recommendations Therapy as outlined in treatment plan below   Prognosis 04/10/2016 Prognosis for Safe Diet Advancement Fair Barriers to Reach Goals Cognitive deficits Barriers/Prognosis Comment -- CHL IP DIET RECOMMENDATION 04/10/2016 SLP Diet Recommendations Dysphagia 1 (Puree) solids;Thin liquid;Other (Comment) Liquid Administration via Cup;No straw Medication Administration Crushed with puree Compensations Slow rate;Minimize environmental distractions;Small sips/bites;Lingual sweep for clearance of pocketing Postural Changes Seated upright at 90 degrees   CHL IP OTHER RECOMMENDATIONS 04/10/2016 Recommended Consults -- Oral Care Recommendations Oral care BID Other Recommendations --   CHL IP FOLLOW UP RECOMMENDATIONS 04/10/2016 Follow up Recommendations Skilled Nursing facility   [facility] IP FREQUENCY AND DURATION 04/10/2016 Speech Therapy Frequency (ACUTE ONLY) min 2x/week Treatment Duration 2 weeks      CHL IP ORAL PHASE 04/10/2016 Oral Phase Impaired Oral - Pudding Teaspoon NT Oral - Pudding Cup -- Oral - Honey Teaspoon -- Oral - Honey Cup -- Oral - Nectar Teaspoon -- Oral - Nectar Cup -- Oral - Nectar Straw -- Oral - Thin Teaspoon -- Oral - Thin Cup -- Oral - Thin Straw -- Oral - Puree Holding of bolus;Delayed oral transit Oral - Mech Soft -- Oral - Regular WFL Oral - Multi-Consistency -- Oral - Pill -- Oral Phase - Comment --  CHL IP PHARYNGEAL PHASE 04/10/2016  Pharyngeal Phase Impaired Pharyngeal- Pudding Teaspoon -- Pharyngeal -- Pharyngeal- Pudding Cup -- Pharyngeal -- Pharyngeal- Honey Teaspoon -- Pharyngeal -- Pharyngeal- Honey Cup -- Pharyngeal -- Pharyngeal- Nectar Teaspoon -- Pharyngeal -- Pharyngeal- Nectar Cup -- Pharyngeal -- Pharyngeal- Nectar Straw -- Pharyngeal -- Pharyngeal- Thin Teaspoon -- Pharyngeal -- Pharyngeal- Thin Cup Delayed swallow initiation-vallecula Pharyngeal -- Pharyngeal- Thin Straw Delayed swallow initiation-vallecula;Penetration/Aspiration during swallow Pharyngeal Material enters airway, CONTACTS cords and not ejected out Pharyngeal- Puree -- Pharyngeal -- Pharyngeal- Mechanical Soft -- Pharyngeal -- Pharyngeal- Regular WFL Pharyngeal -- Pharyngeal- Multi-consistency -- Pharyngeal -- Pharyngeal- Pill -- Pharyngeal -- Pharyngeal Comment --  CHL IP CERVICAL ESOPHAGEAL PHASE 04/10/2016 Cervical Esophageal Phase WFL Pudding Teaspoon -- Pudding Cup -- Honey Teaspoon -- Honey Cup -- Nectar Teaspoon -- Nectar Cup -- Nectar Straw -- Thin Teaspoon -- Thin Cup -- Thin Straw -- Puree -- Mechanical Soft -- Regular -- Multi-consistency -- Pill -- Cervical Esophageal Comment -- No flowsheet data found. [place] Siren 04/10/2016, 3:19 PM  Orbie Pyo Colvin Caroli.Ed CCC-SLP Pager [phone]              Scheduled Meds: . amitriptyline  50 mg Oral QHS  . aspirin  325 mg Oral QPM  . buPROPion  150 mg Oral TID  . cholecalciferol  1,000 Units Oral q morning - 10a  . ciprofloxacin  400 mg Intravenous Q12H  . heparin subcutaneous  5,000 Units Subcutaneous Q8H  . ipratropium-albuterol  3 mL Nebulization TID  . mouth rinse  15 mL Mouth Rinse BID  . metoprolol  5 mg Intravenous Q6H  . mupirocin cream   Topical Daily  . vancomycin  1,000 mg Intravenous Q24H      Time spent: 25 min  [facility] Pager [phone]. If 7PM-7AM, please contact night-coverage at [email or web address], Office  [phone]  password [email or web address] 04/10/2016,  4:13 PM  LOS: 10 days

## 2016-04-10 NOTE — Progress Notes (Signed)
Speech Language Pathology Treatment: Dysphagia  Patient Details Name: [name] MRN: [id number] DOB: [birth year] Today's Date: 04/10/2016 Time: [id number] SLP Time Calculation (min) (ACUTE ONLY): 8 min  Assessment / Plan / Recommendation Clinical Impression  Ms. Pillars was alert and pleasant, but had unintelligible/mumbling speech. SLP administered small cup sips of thin liquids and applesauce; pt exhibited s/s of aspiration at bedside, including both an immediate and delayed cough. Due to these signs, SLP scheduled MBS for today to assess the pt's swallow functioning, safety, and to determine if a diet upgrade is feasible. Pt was educated re: the upcoming instrumental test (MBS) and the rationale for it.   HPI HPI: [name] transferred from [facility] ED with septic shock due to staph areus thought to be due to osteomyelitis of Lt second toe - work up underway. Pt with intermittent Lt sided weakness upon presentation - Head CT without acute changes. PMH includes: CLL, DM, HTN, and h/o falls per H&P she was a "frequent flyer" in ED typically for falls. Pt reintubated on 04/02/16 and extubated on 04/08/16.       SLP Plan  MBS     Recommendations  Diet recommendations: NPO Liquids provided via: Cup;No straw Medication Administration: Crushed with puree Supervision: Full supervision/cueing for compensatory strategies Compensations: Slow rate;Small sips/bites Postural Changes and/or Swallow Maneuvers: Seated upright 90 degrees                Oral Care Recommendations: Oral care QID Plan: MBS       GO                Fransisca Kaufmann, Student-SLP 04/10/2016, 9:59 AM

## 2016-04-10 NOTE — Progress Notes (Signed)
MBS scheduled for today 10"00  Orbie Pyo Loraine M.Ed [employer] [phone]

## 2016-04-10 NOTE — Procedures (Signed)
Objective Swallowing Evaluation: Type of Study: MBS-Modified Barium Swallow Study  Patient Details  Name: [name] MRN: [id number] Date of Birth: May 09, 1945  Today's Date: 04/10/2016 Time: SLP Start Time (ACUTE ONLY): 1046-SLP Stop Time (ACUTE ONLY): 1107 SLP Time Calculation (min) (ACUTE ONLY): 21 min  Past Medical History:  Past Medical History:  Diagnosis Date  . Anemia   . Anxiety   . Anxiety disorder   . Arthritis    "pretty much all over" (01/05/2016)  . Asthma   . Atrial fibrillation ([place])   . Avascular necrosis of hip ([place])    left  . CHF (congestive heart failure) ([place])   . Chronic bronchitis ([place])   . Chronic lower back pain   . COPD (chronic obstructive pulmonary disease) ([place])   . Depression   . ETOH abuse   . Heart murmur    "think I outgrew it" (01/05/2016)  . High cholesterol   . History of blood transfusion 12/2015   "while in ICU I think" (01/05/2016)  . HTN (hypertension)   . Hyperglycemia   . On home O2    "2.5-3L 24/7" (01/05/2016)  . Pneumonia    "several times"  . Psoriatic arthritis ([place])    "we give her injections of Humera twice/month unless she's ill; think the last one she had was 08/23/2015" (01/05/2016)   Past Surgical History:  Past Surgical History:  Procedure Laterality Date  . JOINT REPLACEMENT    . TONSILLECTOMY AND ADENOIDECTOMY    . TOTAL HIP ARTHROPLASTY Right 05/15/2008  . TUBAL LIGATION     HPI: [name] transferred from [facility] ED with septic shock due to staph areus thought to be due to osteomyelitis of Lt second toe - work up underway. Pt with intermittent Lt sided weakness upon presentation - Head CT without acute changes. PMH includes: CLL, DM, HTN, and h/o falls per H&P she was a "frequent flyer" in ED typically for falls. Pt reintubated on 04/02/16 and extubated on 04/08/16.   Subjective: alert, pleasant, very weak   Assessment / Plan / Recommendation  CHL IP CLINICAL IMPRESSIONS 04/10/2016  Therapy  Diagnosis Mild oral phase dysphagia;Mild pharyngeal phase dysphagia  Clinical Impression [name] exhibits mild oral dysphagia characterized by prolonged oral transit and oral holding during puree trial. Sensory based pharygneal dysphagia marked by delayed swallow initiation to the valleculae and silent penetration to the level of the vocal cords during the swallow with liquids via straw. Pt's dementia prohibited attempting compensatory techniques. Throat clearing during study without observation of penetrates/aspirates. Due to the increased effort required to [facility] and pt's current level of mentation, SLP recommends Dys 1 diet, thin liquids (no straw), meds crushed in puree. Will continue to f/u re: pt's swallowing function, diet management/tolerance, and pt's safety/efficiency.  Impact on safety and function Moderate aspiration risk      CHL IP TREATMENT RECOMMENDATION 04/10/2016  Treatment Recommendations Therapy as outlined in treatment plan below     Prognosis 04/10/2016  Prognosis for Safe Diet Advancement Fair  Barriers to Reach Goals Cognitive deficits  Barriers/Prognosis Comment --    CHL IP DIET RECOMMENDATION 04/10/2016  SLP Diet Recommendations Dysphagia 1 (Puree) solids;Thin liquid;Other (Comment)  Liquid Administration via Cup;No straw  Medication Administration Crushed with puree  Compensations Slow rate;Minimize environmental distractions;Small sips/bites;Lingual sweep for clearance of pocketing  Postural Changes Seated upright at 90 degrees      CHL IP OTHER RECOMMENDATIONS 04/10/2016  Recommended Consults --  Oral Care Recommendations Oral care  BID  Other Recommendations --      CHL IP FOLLOW UP RECOMMENDATIONS 04/10/2016  Follow up Recommendations Skilled Nursing facility      [facility] IP FREQUENCY AND DURATION 04/10/2016  Speech Therapy Frequency (ACUTE ONLY) min 2x/week  Treatment Duration 2 weeks           CHL IP ORAL PHASE 04/10/2016  Oral Phase Impaired   Oral - Pudding Teaspoon NT  Oral - Pudding Cup --  Oral - Honey Teaspoon --  Oral - Honey Cup --  Oral - Nectar Teaspoon --  Oral - Nectar Cup --  Oral - Nectar Straw --  Oral - Thin Teaspoon --  Oral - Thin Cup --  Oral - Thin Straw --  Oral - Puree Holding of bolus;Delayed oral transit  Oral - Mech Soft --  Oral - Regular WFL  Oral - Multi-Consistency --  Oral - Pill --  Oral Phase - Comment --    CHL IP PHARYNGEAL PHASE 04/10/2016  Pharyngeal Phase Impaired  Pharyngeal- Pudding Teaspoon --  Pharyngeal --  Pharyngeal- Pudding Cup --  Pharyngeal --  Pharyngeal- Honey Teaspoon --  Pharyngeal --  Pharyngeal- Honey Cup --  Pharyngeal --  Pharyngeal- Nectar Teaspoon --  Pharyngeal --  Pharyngeal- Nectar Cup --  Pharyngeal --  Pharyngeal- Nectar Straw --  Pharyngeal --  Pharyngeal- Thin Teaspoon --  Pharyngeal --  Pharyngeal- Thin Cup Delayed swallow initiation-vallecula  Pharyngeal --  Pharyngeal- Thin Straw Delayed swallow initiation-vallecula;Penetration/Aspiration during swallow  Pharyngeal Material enters airway, CONTACTS cords and not ejected out  Pharyngeal- Puree --  Pharyngeal --  Pharyngeal- Mechanical Soft --  Pharyngeal --  Pharyngeal- Regular WFL  Pharyngeal --  Pharyngeal- Multi-consistency --  Pharyngeal --  Pharyngeal- Pill --  Pharyngeal --  Pharyngeal Comment --     CHL IP CERVICAL ESOPHAGEAL PHASE 04/10/2016  Cervical Esophageal Phase WFL  Pudding Teaspoon --  Pudding Cup --  Honey Teaspoon --  Honey Cup --  Nectar Teaspoon --  Nectar Cup --  Nectar Straw --  Thin Teaspoon --  Thin Cup --  Thin Straw --  Puree --  Mechanical Soft --  Regular --  Multi-consistency --  Pill --  Cervical Esophageal Comment --    No flowsheet data found.  Houston Siren 04/10/2016, 3:22 PM

## 2016-04-10 NOTE — Care Management Important Message (Signed)
Important Message  Patient Details  Name: KAYSON GANGULY MRN: [id number] Date of Birth: 1945/07/20   Medicare Important Message Given:  Yes    Ommie Degeorge, Rory Percy, RN 04/10/2016, 1:09 PM

## 2016-04-10 NOTE — Progress Notes (Signed)
Pharmacy Antibiotic Note   71 y.o. female admitted on 03/31/2016 with sepsis.  Pt has had multiple admissions and has had separate episodes of a MRSA pneumonia and Pseudomonas pneumonia in the past year. Her respiratory culture is currently growing MRSA and pseudomonas.   Plan: -Vancomycin 1000 mg IV q24h -Ciprofloxacin 400mg  IV q12h -Both antibiotics to end 1/28 -Pharmacy to sign off as doubt dose changes will be required as renal function has been stable throughout admission   Height: 4\' 10"  (147.3 cm) Weight: 96 lb 1.9 oz (43.6 kg) IBW/kg (Calculated) : 40.9  Temp (24hrs), Avg:98.1 F (36.7 C), Min:97.7 F (36.5 C), Max:98.9 F (37.2 C)   Recent Labs Lab 04/04/16 1921  04/06/16 0439 04/06/16 0440 04/07/16 0243 04/07/16 0245 04/08/16 0425 04/08/16 2009 04/09/16 0330 04/10/16 0640  WBC  --   < > 10.7*  --  16.6*  --  11.1*  --  14.2* 16.3*  CREATININE  --   < >  --  0.79  --  0.73 0.71  --  0.67 0.72  VANCOTROUGH 11*  --   --   --   --   --   --  13*  --   --   < > = values in this interval not displayed.  Estimated Creatinine Clearance: 41 mL/min (by C-G formula based on SCr of 0.72 mg/dL).    Allergies  Allergen Reactions  . Celexa [Citalopram Hydrobromide] Other (See Comments)    "Jerky movements"  . Nickel     Unknown reaction  . Tramadol Hcl Nausea And Vomiting   Cipro 1/18 >> (1/28) Cefepime 1/14 >> 1/18 Vancomycin 1/14 >> (1/28) Levaquin 1/15 >> 1/17  1/18 vt = 11 on 500 q24h 1/22 vt = 13 on 750 q24h  1/14 BCx: neg 1/15 UCx: neg 1/15 Sputum: MRSA, pseudomonas 1/16 TA: pseudomonas 1/15 MRSA PCR: positive 1/21 TA: nml flora 1/22 BCx: ngtd  Ofilia Rayon D. Jeryn Cerney, PharmD, BCPS Clinical Pharmacist Pager: [phone] 04/10/2016 1:17 PM

## 2016-04-10 NOTE — Progress Notes (Signed)
PT Cancellation Note  Patient Details Name: [name] MRN: [id number] DOB: [birth year]   Cancelled Treatment:    Reason Eval/Treat Not Completed: Fatigue/lethargy limiting ability to participate;Other (comment) (eating on arrival and family wanted to fininsh.)  04/10/2016  Donnella Sham, [place] [phone]  (pager)   Tessie Fass Shandee Jergens 04/10/2016, 5:13 PM

## 2016-04-11 ENCOUNTER — Encounter (HOSPITAL_COMMUNITY): Payer: Self-pay | Admitting: General Practice

## 2016-04-11 DIAGNOSIS — D649 Anemia, unspecified: Secondary | ICD-10-CM

## 2016-04-11 LAB — CBC
HCT: 29.5 % — ABNORMAL LOW (ref 36.0–46.0)
HEMATOCRIT: 23.1 % — AB (ref 36.0–46.0)
HEMOGLOBIN: 6.9 g/dL — AB (ref 12.0–15.0)
HEMOGLOBIN: 9.1 g/dL — AB (ref 12.0–15.0)
MCH: 27.9 pg (ref 26.0–34.0)
MCH: 28.5 pg (ref 26.0–34.0)
MCHC: 29.9 g/dL — ABNORMAL LOW (ref 30.0–36.0)
MCHC: 30.8 g/dL (ref 30.0–36.0)
MCV: 93.5 fL (ref 78.0–100.0)
MCV: 92.5 fL (ref 78.0–100.0)
PLATELETS: 214 10*3/uL (ref 150–400)
Platelets: 239 10*3/uL (ref 150–400)
RBC: 2.47 MIL/uL — AB (ref 3.87–5.11)
RBC: 3.19 MIL/uL — AB (ref 3.87–5.11)
RDW: 16.1 % — ABNORMAL HIGH (ref 11.5–15.5)
RDW: 15.8 % — ABNORMAL HIGH (ref 11.5–15.5)
WBC: 7.8 10*3/uL (ref 4.0–10.5)
WBC: 11.3 10*3/uL — ABNORMAL HIGH (ref 4.0–10.5)

## 2016-04-11 LAB — PREPARE RBC (CROSSMATCH)

## 2016-04-11 MED ORDER — SODIUM CHLORIDE 0.9 % IV SOLN: Freq: Once | INTRAVENOUS | Status: DC

## 2016-04-11 MED ORDER — ENSURE ENLIVE PO LIQD
237.0000 mL | Freq: Two times a day (BID) | ORAL | Status: DC
  Administered 2016-04-11 – 2016-04-12 (×3): 237 mL via ORAL

## 2016-04-11 NOTE — Progress Notes (Signed)
CRITICAL VALUE ALERT  Critical value received: Hemoglobin-6.9  Date of notification:  04/11/2016  Time of notification:  0605  Critical value read back:Yes.    Nurse who received alert:  Rushie Goltz, RN  MD notified (1st page):  K. Schorr  Awaiting orders

## 2016-04-11 NOTE — Progress Notes (Signed)
Patient initial Hgb 6.9 and one unit of RBC given. Patient has an additional 2 units of RBCs ordered without recheck of Hgb. Hgb now 9.1. RN notified on call NP, Schorr. RN awaiting orders.   Rushie Goltz, RN

## 2016-04-11 NOTE — Care Management Note (Signed)
Case Management Note  Patient Details  Name: [name] MRN: [id number] Date of Birth: 01/22/1946  Subjective/Objective:        CM following for progression and d/c planning.             Action/Plan: 04/11/2016 Met with pt who is confused , noted plan is for SNF. No HH or DME needs at this time.   Expected Discharge Date:    04/12/2016              Expected Discharge Plan:  [place]  In-House Referral:  Clinical Social Work  Discharge planning Services  NA  Post Acute Care Choice:  NA Choice offered to:  NA  DME Arranged:  N/A DME Agency:  NA  HH Arranged:  NA HH Agency:  NA  Status of Service:  Completed, signed off  If discussed at [employer] of Stay Meetings, dates discussed:    Additional Comments:  Adron Bene, RN 04/11/2016, 12:14 PM

## 2016-04-11 NOTE — Progress Notes (Signed)
Patient received one unit of blood today, plan from MD is to recheck Hgb before administering another unit of blood.

## 2016-04-11 NOTE — Progress Notes (Signed)
Nutrition Follow-up  DOCUMENTATION CODES:   Underweight, Severe malnutrition in context of chronic illness  INTERVENTION:  Provide Ensure Enlive po BID, each supplement provides 350 kcal and 20 grams of protein.  Encourage adequate PO intake.   NUTRITION DIAGNOSIS:   Malnutrition related to chronic illness as evidenced by severe depletion of muscle mass, severe depletion of body fat; ongoing  GOAL:   Patient will meet greater than or equal to 90% of their needs; not met  MONITOR:   Diet advancement, PO intake, Skin  REASON FOR ASSESSMENT:   Consult, Ventilator Enteral/tube feeding initiation and management  ASSESSMENT:    71 y.o. female with medical history significant for COPD with chronic respiratory failure, hypertension, depression, anxiety, chronic diastolic CHF, and severe protein calorie malnutrition who presents to the emergency department for evaluation of productive cough, generalized weakness, loss of appetite, and lethargy.  Pt advanced to a dysphagia 1 diet with thin liquids. Meal completion has been 35-55%. Pt was unavailable during time of visit. RD to order Ensure to aid in caloric and protein needs. RD to continue to monitor. Labs and medications reviewed.   Diet Order:  DIET - DYS 1 Room service appropriate? Yes; Fluid consistency: Thin  Skin:  Wound (see comment) (Stg IV L leg, Stg IV R heel, Stg 1 sacrum, Unstg R foot,)  Last BM:  1/24  Height:   Ht Readings from Last 1 Encounters:  04/02/16 _0  (1.473 m)    Weight:   Wt Readings from Last 1 Encounters:  04/10/16 96 lb (43.5 kg)    Ideal Body Weight:  43.9 kg  BMI:  Body mass index is 20.06 kg/m.  Estimated Nutritional Needs:   Kcal:  1250-1450  Protein:  75-90 grams  Fluid:  1.8 L  EDUCATION NEEDS:   No education needs identified at this time  Corrin Parker, MS, RD, LDN Pager # [phone] After hours/ weekend pager # [phone]

## 2016-04-11 NOTE — Evaluation (Signed)
Physical Therapy Evaluation Patient Details Name: [name] MRN: [id number] DOB: [birth year] Today's Date: 04/11/2016   History of Present Illness  71 year old with historyCOPD on home O2, diastolic CHF, atrial fibrillation on full dose aspirin, protein calorie malnutrition, and recent admission (12/2015) for MRSA pneumonia requiring mechanical ventilation presented with generalized weakness, worsening cough productive of thick yellow sputum. Since her recent admissions she has never fully regained her strength and has been limited in her function at home, and has had a very poor appetite. Chest x-ray concerning for left upper lobe infiltrate. She was admitted to the hospitalist service for treatment of HCAP with broad-spectrum antibiotics and COPD exacerbation. Hospital course complicated by progressive lethargy, which progressed to near unresponsiveness on the morning of 1/16. ABG at that time significant for pH 7.16. PCCM was consulted,  patient was intubated from 04/02/2016 to  04/08/2016.  Clinical Impression  Pt admitted with/for complications of HCAP and COPD.  Pt is significantly debilitated and assists minimally at time, but quickly fatigues..  Pt currently limited functionally due to the problems listed below.  (see problems list.)  Pt will benefit from PT to maximize function and safety to be able to get home safely with available assist.     Follow Up Recommendations Home health PT;Supervision/Assistance - 24 hour (but depends on if family accepts the burden of care/?hospice)    Equipment Recommendations  Other (comment) (TBA)    Recommendations for Other Services       Precautions / Restrictions Precautions Precautions: Fall      Mobility  Bed Mobility Overal bed mobility: Needs Assistance Bed Mobility: Rolling;Sidelying to Sit;Sit to Supine Rolling: Max assist Sidelying to sit: Max assist   Sit to supine: Total assist   General bed mobility comments: pt  participated more after she became more alert at EOB  Transfers Overall transfer level: Needs assistance Equipment used: None Transfers: Sit to/from Stand Sit to Stand: Max assist         General transfer comment: face to face sit to stand at EOB to see if feet/ankles would attain neutral alignment.  Ambulation/Gait                Stairs            Wheelchair Mobility    Modified Rankin (Stroke Patients Only)       Balance Overall balance assessment: Needs assistance Sitting-balance support: Feet supported;Single extremity supported;Bilateral upper extremity supported Sitting balance-Leahy Scale: Poor Sitting balance - Comments: listing off to the right and posteriorly     Standing balance-Leahy Scale: Zero Standing balance comment: needed full support to attain standing                             Pertinent Vitals/Pain Pain Assessment: Faces Faces Pain Scale: No hurt    Home Living Family/patient expects to be discharged to:: Private residence Living Arrangements: Spouse/significant other Available Help at Discharge: Family;Available 24 hours/day Type of Home: Mobile home Home Access: Stairs to enter;Ramped entrance   Entrance Stairs-Number of Steps: 2 in the front and 2 in the back.  Home Layout: One level Home Equipment: Cane - single point;Walker - 4 wheels;Bedside commode;Shower seat Additional Comments: reports also having w/c and hospital bed from previous illnesses, may be at her son's home    Prior Function Level of Independence: Needs assistance   Gait / Transfers Assistance Needed: walking with RW in recent past, but with frequent  falls per chart.           Hand Dominance   Dominant Hand: Right    Extremity/Trunk Assessment   Upper Extremity Assessment Upper Extremity Assessment: Generalized weakness    Lower Extremity Assessment Lower Extremity Assessment: Generalized weakness (tonal changes/variability)     Cervical / Trunk Assessment Cervical / Trunk Assessment: Kyphotic  Communication   Communication: No difficulties  Cognition Arousal/Alertness: Lethargic;Awake/alert Behavior During Therapy: Flat affect Overall Cognitive Status: No family/caregiver present to determine baseline cognitive functioning                      General Comments General comments (skin integrity, edema, etc.): pt was visibly fatigued by end of session.    Exercises     Assessment/Plan    PT Assessment Patient needs continued PT services  PT Problem List Decreased strength;Decreased activity tolerance;Decreased balance;Decreased mobility;Decreased coordination;Cardiopulmonary status limiting activity          PT Treatment Interventions Functional mobility training;Therapeutic activities;Therapeutic exercise;Balance training;Patient/family education    PT Goals (Current goals can be found in the Care Plan section)  Acute Rehab PT Goals Patient Stated Goal: pt unable PT Goal Formulation: Patient unable to participate in goal setting Time For Goal Achievement: 28-Apr-2016 Potential to Achieve Goals: Fair    Frequency Min 3X/week   Barriers to discharge        Co-evaluation               End of Session Equipment Utilized During Treatment:  (hip/knee flexion/ext warm up ROM exercise prior to mobility.) Activity Tolerance: Patient limited by lethargy;Patient limited by fatigue Patient left: in bed;with call bell/phone within reach;with bed alarm set Nurse Communication: Mobility status         Time: [id number] PT Time Calculation (min) (ACUTE ONLY): 23 min   Charges:   PT Evaluation $PT Eval Moderate Complexity: 1 Procedure PT Treatments $Therapeutic Activity: 8-22 mins   PT G CodesTessie Fass Vasilis Luhman 04/11/2016, 5:27 PM 04/11/2016  Donnella Sham, [place] [phone]  (pager)

## 2016-04-11 NOTE — Care Management Important Message (Signed)
Important Message  Patient Details  Name: [name] MRN: [id number] Date of Birth: 09/07/1945   Medicare Important Message Given:  Yes    Mitzy Naron, Rory Percy, RN 04/11/2016, 12:05 PM

## 2016-04-11 NOTE — Progress Notes (Signed)
[facility]  PROGRESS NOTE  [name] [id number] DOB: 13-Feb-1946 DOA: 03/31/2016 PCP: Chevis Pretty, FNP   Brief HPI:    71 year old with history COPD on home O2, diastolic CHF, atrial fibrillation on full dose aspirin, protein calorie malnutrition, and recent admission (12/2015) for MRSA pneumonia requiring mechanical ventilation presented with generalized weakness, worsening cough productive of thick yellow sputum. Since her recent admissions she has never fully regained her strength and has been limited in her function at home, and has had a very poor appetite. Chest x-ray concerning for left upper lobe infiltrate. She was admitted to the hospitalist service for treatment of HCAP with broad-spectrum antibiotics and COPD exacerbation. Hospital course complicated by progressive lethargy, which progressed to near unresponsiveness on the morning of 1/16. ABG at that time significant for pH 7.16. PCCM was consulted,  patient was intubated from 04/02/1818 04/08/2016.   Subjective   Patient seen and examined, continues to be lethargic.   Assessment/Plan:     1. Acute on chronic hypercarbic respiratory failure- secondary to COPD, healthcare associated pneumonia. Now extubated. Continue oxygen.  Scheduled nebs 2. Healthcare associated pneumonia-  sputum culture growing staphylococci, Pseudomonas, with recent history of MRSA. Patient currently on vancomycin and ciprofloxacin for 10 day course. Stop on 04/14/2016. 3. Atrial fibrillation- continue metoprolol 25 mg by mouth twice a day, she is not chronically  anticoagulated, continue aspirin. 4. Dysphagia- Patient is moderate aspiration risk, speech therapy recommends dysphagia 1 diet. 5. Encephalopathy- likely from hypercarbia, slowly improving. 6. Right hip pain- patient has been evaluated by PCP, infected total right greater trochanter. Per PCP notes patient and family declined orthopedics consultation, this was confirmed by  the admitting physician at the time of admission on 03/31/2016. 7. Bilateral leg wounds- patient has nonhealing ulcers in bilateral lower extremities. Wound care consulted. 8. Normocytic anemia-chronic, hemoglobin was 9.1 on admission/FOBT negative. Status post 1 unit of PRBC on 04/03/2016. Hemoglobin is 6.9 today.B12, folate, ferritin, and iron-saturation normal in August 2017. Will transfuse 2 units PRBC.    DVT prophylaxis: *Heparin  Code Status: DO NOT RESUSCITATE  Family Communication: No family present at bedside, unable to contact the family on phone.  Disposition Plan: *To be decided   Consultants:  PCCM    Procedures: 1/14 Admit for MRSA PNA on vent. Sputum with MRSA and psedumononas 1/16 ETT 1/16>>1/22 1/16 L IJ CVL 1/16>> 1/16 sputum - pseudomonas 1/22 Fam decided on DNR    Antibiotics:   Anti-infectives    Start     Dose/Rate Route Frequency Ordered Stop   04/10/16 1400  ciprofloxacin (CIPRO) IVPB 400 mg     400 mg 200 mL/hr over 60 Minutes Intravenous Every 12 hours 04/10/16 1309 04/14/16 1359   04/09/16 1800  vancomycin (VANCOCIN) IVPB 1000 mg/200 mL premix     1,000 mg 200 mL/hr over 60 Minutes Intravenous Every 24 hours 04/08/16 2052 04/14/16 1759   04/04/16 2100  vancomycin (VANCOCIN) IVPB 750 mg/150 ml premix  Status:  Discontinued     750 mg 150 mL/hr over 60 Minutes Intravenous Every 24 hours 04/04/16 2009 04/08/16 2052   04/04/16 1300  ciprofloxacin (CIPRO) IVPB 400 mg  Status:  Discontinued     400 mg 200 mL/hr over 60 Minutes Intravenous Every 12 hours 04/04/16 1220 04/10/16 1309   04/03/16 1400  ceFEPIme (MAXIPIME) 2 g in dextrose 5 % 50 mL IVPB  Status:  Discontinued     2 g 100 mL/hr over 30 Minutes Intravenous Every 12 hours  04/03/16 1328 04/04/16 1220   04/02/16 0000  ceFEPIme (MAXIPIME) 2 g in dextrose 5 % 50 mL IVPB  Status:  Discontinued     2 g 100 mL/hr over 30 Minutes Intravenous Every 24 hours 04/01/16 0002 04/03/16 1328    04/01/16 2000  vancomycin (VANCOCIN) 500 mg in sodium chloride 0.9 % 100 mL IVPB  Status:  Discontinued     500 mg 100 mL/hr over 60 Minutes Intravenous Every 24 hours 03/31/16 2214 04/04/16 2009   04/01/16 0600  piperacillin-tazobactam (ZOSYN) IVPB 3.375 g  Status:  Discontinued     3.375 g 12.5 mL/hr over 240 Minutes Intravenous Every 8 hours 03/31/16 2214 03/31/16 2357   04/01/16 0300  levofloxacin (LEVAQUIN) IVPB 500 mg  Status:  Discontinued     500 mg 100 mL/hr over 60 Minutes Intravenous Every 48 hours 04/01/16 0222 04/03/16 1124   04/01/16 0000  ceFEPIme (MAXIPIME) 2 g in dextrose 5 % 50 mL IVPB     2 g 100 mL/hr over 30 Minutes Intravenous  Once 03/31/16 2357 04/01/16 0111   03/31/16 1915  piperacillin-tazobactam (ZOSYN) IVPB 3.375 g     3.375 g 100 mL/hr over 30 Minutes Intravenous  Once 03/31/16 1906 03/31/16 2059   03/31/16 1915  vancomycin (VANCOCIN) IVPB 1000 mg/200 mL premix     1,000 mg 200 mL/hr over 60 Minutes Intravenous  Once 03/31/16 1906 03/31/16 2059       Objective   Vitals:   04/11/16 1042 04/11/16 1100 04/11/16 1101 04/11/16 1336  BP:  135/60 (!) 123/52 (!) 153/76  Pulse:  96 100 (!) 102  Resp:  18 18 20   Temp:  98 F (36.7 C) 98.2 F (36.8 C) 97.7 F (36.5 C)  TempSrc: Oral Oral Oral Oral  SpO2:  96% 97% 100%  Weight:      Height:        Intake/Output Summary (Last 24 hours) at 04/11/16 1629 Last data filed at 04/11/16 1339  Gross per 24 hour  Intake             1216 ml  Output              475 ml  Net              741 ml   Filed Weights   04/09/16 0323 04/09/16 2224 04/10/16 2127  Weight: 43.2 kg (95 lb 3.8 oz) 43.6 kg (96 lb 1.9 oz) 43.5 kg (96 lb)     Physical Examination:  General exam: Appears lethargic Respiratory system: Clear to auscultation. Respiratory effort normal. Cardiovascular system:  RRR. No  murmurs, rubs, gallops. No pedal edema. GI system: Abdomen is nondistended, soft and nontender. No organomegaly.  Central  nervous system. No focal neurological deficits. 5 x 5 power in all extremities. Skin: No rashes, lesions or ulcers. Psychiatry: Alert, oriented x 3.Judgement and insight appear normal. Affect normal.    Data Reviewed: I have personally reviewed following labs and imaging studies  CBG:  Recent Labs Lab 04/08/16 1212 04/08/16 1631 04/08/16 1943 04/08/16 2345 04/09/16 0316  GLUCAP 122* 189* 76 84 72    CBC:  Recent Labs Lab 04/05/16 0354 04/06/16 0439 04/07/16 0243 04/08/16 0425 04/09/16 0330 04/10/16 0640 04/11/16 0512  WBC 11.4* 10.7* 16.6* 11.1* 14.2* 16.3* 7.8  NEUTROABS 10.5* 10.0* 15.8* 9.6*  --   --   --   HGB 8.7* 7.9* 8.2* 7.1* 7.4* 7.7* 6.9*  HCT 27.7* 25.9* 26.9* 23.1* 24.2* 25.7* 23.1*  MCV 89.1 91.2 91.8 92.4 93.4 92.8 93.5  PLT 117* 93* 128* 121* 153 289 [id number]    Basic Metabolic Panel:  Recent Labs Lab 04/05/16 0354  04/06/16 0439 04/06/16 0440 04/07/16 0243 04/07/16 0245 04/08/16 0425 04/09/16 0330 04/10/16 0640  NA  --   < >  --  139  --  140 141 141 139  K  --   < >  --  3.6  --  3.5 2.7* 4.1 3.9  CL  --   < >  --  99*  --  99* 100* 103 100*  CO2  --   < >  --  34*  --  35* 36* 35* 32  GLUCOSE  --   < >  --  234*  --  142* 171* 79 84  BUN  --   < >  --  26*  --  26* 25* 21* 16  CREATININE  --   < >  --  0.79  --  0.73 0.71 0.67 0.72  CALCIUM  --   < >  --  7.7*  --  8.2* 7.8* 8.5* 9.0  MG 2.7*  --  1.3*  --  2.0  --  1.3* 2.3  --   PHOS  --   < > 4.0 4.1  --  2.0* 1.9* 3.1  --   < > = values in this interval not displayed.  Recent Results (from the past 240 hour(s))  Culture, respiratory (NON-Expectorated)     Status: None   Collection Time: 04/02/16 10:35 AM  Result Value Ref Range Status   Specimen Description TRACHEAL ASPIRATE  Final   Special Requests Normal  Final   Gram Stain   Final    MODERATE WBC PRESENT, PREDOMINANTLY PMN FEW GRAM NEGATIVE RODS    Culture FEW PSEUDOMONAS AERUGINOSA  Final   Report Status 04/04/2016 FINAL   Final   Organism ID, Bacteria PSEUDOMONAS AERUGINOSA  Final      Susceptibility   Pseudomonas aeruginosa - MIC*    CEFTAZIDIME 2 SENSITIVE Sensitive     CIPROFLOXACIN <=0.25 SENSITIVE Sensitive     GENTAMICIN <=1 SENSITIVE Sensitive     IMIPENEM 2 SENSITIVE Sensitive     PIP/TAZO <=4 SENSITIVE Sensitive     CEFEPIME <=1 SENSITIVE Sensitive     * FEW PSEUDOMONAS AERUGINOSA  Culture, expectorated sputum-assessment     Status: None   Collection Time: 04/07/16  3:53 PM  Result Value Ref Range Status   Specimen Description TRACHEAL ASPIRATE  Final   Special Requests NONE  Final   Sputum evaluation THIS SPECIMEN IS ACCEPTABLE FOR SPUTUM CULTURE  Final   Report Status 04/07/2016 FINAL  Final  Culture, respiratory (NON-Expectorated)     Status: None   Collection Time: 04/07/16  3:53 PM  Result Value Ref Range Status   Specimen Description TRACHEAL ASPIRATE  Final   Special Requests NONE Reflexed from [id number]  Final   Gram Stain   Final    ABUNDANT WBC PRESENT, PREDOMINANTLY PMN NO ORGANISMS SEEN    Culture Consistent with normal respiratory flora.  Final   Report Status 04/09/2016 FINAL  Final  Culture, blood (Routine X 2) w Reflex to ID Panel     Status: None (Preliminary result)   Collection Time: 04/08/16  7:00 AM  Result Value Ref Range Status   Specimen Description BLOOD BLOOD RIGHT HAND  Final   Special Requests IN PEDIATRIC BOTTLE 1CC  Final   Culture NO GROWTH  3 DAYS  Final   Report Status PENDING  Incomplete  Culture, blood (Routine X 2) w Reflex to ID Panel     Status: None (Preliminary result)   Collection Time: 04/08/16  7:12 AM  Result Value Ref Range Status   Specimen Description BLOOD BLOOD RIGHT ARM  Final   Special Requests IN PEDIATRIC BOTTLE 1CC  Final   Culture NO GROWTH 3 DAYS  Final   Report Status PENDING  Incomplete     Liver Function Tests:  Recent Labs Lab 04/05/16 0355 04/06/16 0440 04/07/16 0245 04/08/16 0425 04/09/16 0330  ALBUMIN 1.7* 1.7*  1.8* 1.6* 1.8*   No results for input(s): LIPASE, AMYLASE in the last 168 hours. No results for input(s): AMMONIA in the last 168 hours.  Cardiac Enzymes: No results for input(s): CKTOTAL, CKMB, CKMBINDEX, TROPONINI in the last 168 hours. BNP (last 3 results)  Recent Labs  10/15/15 1629 12/29/15 1905 04/01/16 0239  BNP 181.0* 222.0* 381.1*    ProBNP (last 3 results) No results for input(s): PROBNP in the last 8760 hours.    Studies: Dg Swallowing Func-speech Pathology  Result Date: 04/10/2016 Objective Swallowing Evaluation: Type of Study: MBS-Modified Barium Swallow Study Patient Details Name: MADGELENE CANSECO MRN: [id number] Date of Birth: [birth year] Today's Date: 04/10/2016 Time: SLP Start Time (ACUTE ONLY): 1046-SLP Stop Time (ACUTE ONLY): 1107 SLP Time Calculation (min) (ACUTE ONLY): 21 min Past Medical History: Past Medical History: Diagnosis Date . Anemia  . Anxiety  . Anxiety disorder  . Arthritis   "pretty much all over" (01/05/2016) . Asthma  . Atrial fibrillation ([place])  . Avascular necrosis of hip ([place])   left . CHF (congestive heart failure) ([place])  . Chronic bronchitis ([place])  . Chronic lower back pain  . COPD (chronic obstructive pulmonary disease) ([place])  . Depression  . ETOH abuse  . Heart murmur   "think I outgrew it" (01/05/2016) . High cholesterol  . History of blood transfusion 12/2015  "while in ICU I think" (01/05/2016) . HTN (hypertension)  . Hyperglycemia  . On home O2   "2.5-3L 24/7" (01/05/2016) . Pneumonia   "several times" . Psoriatic arthritis ([place])   "we give her injections of Humera twice/month unless she's ill; think the last one she had was 08/23/2015" (01/05/2016) Past Surgical History: Past Surgical History: Procedure Laterality Date . JOINT REPLACEMENT   . TONSILLECTOMY AND ADENOIDECTOMY   . TOTAL HIP ARTHROPLASTY Right 05/15/2008 . TUBAL LIGATION   HPI: [name] transferred from [facility] ED with septic shock due to staph areus thought to be due to  osteomyelitis of Lt second toe - work up underway. Pt with intermittent Lt sided weakness upon presentation - Head CT without acute changes. PMH includes: CLL, DM, HTN, and h/o falls per H&P she was a "frequent flyer" in ED typically for falls. Pt reintubated on 04/02/16 and extubated on 04/08/16.  Subjective: alert, pleasant, very weak Assessment / Plan / Recommendation CHL IP CLINICAL IMPRESSIONS 04/10/2016 Therapy Diagnosis Mild oral phase dysphagia;Mild pharyngeal phase dysphagia Clinical Impression Mrs. Grigg exhibits mild oral dysphagia characterized by prolonged oral transit and oral holding during puree trial. Sensory based pharygneal dysphagia marked by delayed swallow initiation to the valleculae and silent penetration to the level of the vocal cords during the swallow with liquids via straw. Pt's dementia prohibited attempting compensatory techniques. Throat clearing during study without observation of penetrates/aspirates. Due to the increased effort required to [facility] and pt's current level of mentation, SLP recommends Dys  1 diet, thin liquids (no straw), meds crushed in puree. Will continue to f/u re: pt's swallowing function, diet management/tolerance, and pt's safety/efficiency. Impact on safety and function Moderate aspiration risk   CHL IP TREATMENT RECOMMENDATION 04/10/2016 Treatment Recommendations Therapy as outlined in treatment plan below   Prognosis 04/10/2016 Prognosis for Safe Diet Advancement Fair Barriers to Reach Goals Cognitive deficits Barriers/Prognosis Comment -- CHL IP DIET RECOMMENDATION 04/10/2016 SLP Diet Recommendations Dysphagia 1 (Puree) solids;Thin liquid;Other (Comment) Liquid Administration via Cup;No straw Medication Administration Crushed with puree Compensations Slow rate;Minimize environmental distractions;Small sips/bites;Lingual sweep for clearance of pocketing Postural Changes Seated upright at 90 degrees   CHL IP OTHER RECOMMENDATIONS 04/10/2016 Recommended Consults  -- Oral Care Recommendations Oral care BID Other Recommendations --   CHL IP FOLLOW UP RECOMMENDATIONS 04/10/2016 Follow up Recommendations Skilled Nursing facility   [facility] IP FREQUENCY AND DURATION 04/10/2016 Speech Therapy Frequency (ACUTE ONLY) min 2x/week Treatment Duration 2 weeks      CHL IP ORAL PHASE 04/10/2016 Oral Phase Impaired Oral - Pudding Teaspoon NT Oral - Pudding Cup -- Oral - Honey Teaspoon -- Oral - Honey Cup -- Oral - Nectar Teaspoon -- Oral - Nectar Cup -- Oral - Nectar Straw -- Oral - Thin Teaspoon -- Oral - Thin Cup -- Oral - Thin Straw -- Oral - Puree Holding of bolus;Delayed oral transit Oral - Mech Soft -- Oral - Regular WFL Oral - Multi-Consistency -- Oral - Pill -- Oral Phase - Comment --  CHL IP PHARYNGEAL PHASE 04/10/2016 Pharyngeal Phase Impaired Pharyngeal- Pudding Teaspoon -- Pharyngeal -- Pharyngeal- Pudding Cup -- Pharyngeal -- Pharyngeal- Honey Teaspoon -- Pharyngeal -- Pharyngeal- Honey Cup -- Pharyngeal -- Pharyngeal- Nectar Teaspoon -- Pharyngeal -- Pharyngeal- Nectar Cup -- Pharyngeal -- Pharyngeal- Nectar Straw -- Pharyngeal -- Pharyngeal- Thin Teaspoon -- Pharyngeal -- Pharyngeal- Thin Cup Delayed swallow initiation-vallecula Pharyngeal -- Pharyngeal- Thin Straw Delayed swallow initiation-vallecula;Penetration/Aspiration during swallow Pharyngeal Material enters airway, CONTACTS cords and not ejected out Pharyngeal- Puree -- Pharyngeal -- Pharyngeal- Mechanical Soft -- Pharyngeal -- Pharyngeal- Regular WFL Pharyngeal -- Pharyngeal- Multi-consistency -- Pharyngeal -- Pharyngeal- Pill -- Pharyngeal -- Pharyngeal Comment --  CHL IP CERVICAL ESOPHAGEAL PHASE 04/10/2016 Cervical Esophageal Phase WFL Pudding Teaspoon -- Pudding Cup -- Honey Teaspoon -- Honey Cup -- Nectar Teaspoon -- Nectar Cup -- Nectar Straw -- Thin Teaspoon -- Thin Cup -- Thin Straw -- Puree -- Mechanical Soft -- Regular -- Multi-consistency -- Pill -- Cervical Esophageal Comment -- No flowsheet data found. [place] Siren 04/10/2016, 3:19 PM  Orbie Pyo Colvin Caroli.Ed CCC-SLP Pager [phone]              Scheduled Meds: . sodium chloride   Intravenous Once  . amitriptyline  50 mg Oral QHS  . aspirin  325 mg Oral QPM  . buPROPion  150 mg Oral TID  . cholecalciferol  1,000 Units Oral q morning - 10a  . ciprofloxacin  400 mg Intravenous Q12H  . feeding supplement (ENSURE ENLIVE)  237 mL Oral BID BM  . heparin subcutaneous  5,000 Units Subcutaneous Q8H  . ipratropium-albuterol  3 mL Nebulization TID  . mouth rinse  15 mL Mouth Rinse BID  . metoprolol  5 mg Intravenous Q6H  . mupirocin cream   Topical Daily  . vancomycin  1,000 mg Intravenous Q24H      Time spent: 25 min  [facility] Pager [phone]. If 7PM-7AM, please contact night-coverage at [email or web address], Office  [place]  04/11/2016, 4:29 PM  LOS: 11 days

## 2016-04-12 LAB — BASIC METABOLIC PANEL
Anion gap: 9 (ref 5–15)
BUN: 10 mg/dL (ref 6–20)
CALCIUM: 8.4 mg/dL — AB (ref 8.9–10.3)
CO2: 32 mmol/L (ref 22–32)
CREATININE: 0.79 mg/dL (ref 0.44–1.00)
Chloride: 100 mmol/L — ABNORMAL LOW (ref 101–111)
GFR calc non Af Amer: >60 mL/min (ref >60)
Glucose, Bld: 61 mg/dL — ABNORMAL LOW (ref 65–99)
Potassium: 3.7 mmol/L (ref 3.5–5.1)
Sodium: 141 mmol/L (ref 135–145)

## 2016-04-12 LAB — CBC
HEMATOCRIT: 28 % — AB (ref 36.0–46.0)
Hemoglobin: 8.8 g/dL — ABNORMAL LOW (ref 12.0–15.0)
MCH: 29.2 pg (ref 26.0–34.0)
MCHC: 31.4 g/dL (ref 30.0–36.0)
MCV: 93 fL (ref 78.0–100.0)
Platelets: 220 10*3/uL (ref 150–400)
RBC: 3.01 MIL/uL — ABNORMAL LOW (ref 3.87–5.11)
RDW: 16.3 % — AB (ref 11.5–15.5)
WBC: 9.1 10*3/uL (ref 4.0–10.5)

## 2016-04-12 LAB — GLUCOSE, CAPILLARY: Glucose-Capillary: 71 mg/dL (ref 65–99)

## 2016-04-12 MED ORDER — PREDNISONE 10 MG PO TABS: ORAL_TABLET | ORAL | 0 refills | Status: AC

## 2016-04-12 MED ORDER — MORPHINE SULFATE 20 MG/5ML PO SOLN: 5.0000 mg | ORAL | 0 refills | Status: AC | PRN

## 2016-04-12 MED ORDER — PREDNISONE 50 MG PO TABS
50.0000 mg | ORAL_TABLET | Freq: Every day | ORAL | Status: DC
  Administered 2016-04-12: 50 mg via ORAL
  Filled 2016-04-12: qty 1

## 2016-04-12 MED ORDER — ALPRAZOLAM 0.5 MG PO TABS
0.5000 mg | ORAL_TABLET | Freq: Two times a day (BID) | ORAL | 1 refills | Status: AC | PRN
Start: 2016-04-12 — End: ?

## 2016-04-12 MED ORDER — FUROSEMIDE 10 MG/ML IJ SOLN
20.0000 mg | Freq: Once | INTRAMUSCULAR | Status: AC
  Administered 2016-04-12: 20 mg via INTRAVENOUS
  Filled 2016-04-12: qty 2

## 2016-04-12 MED ORDER — CIPROFLOXACIN HCL 500 MG PO TABS: 500.0000 mg | ORAL_TABLET | Freq: Two times a day (BID) | ORAL | 0 refills | Status: AC

## 2016-04-12 MED ORDER — IPRATROPIUM-ALBUTEROL 0.5-2.5 (3) MG/3ML IN SOLN: 3.0000 mL | RESPIRATORY_TRACT | 2 refills | Status: DC | PRN

## 2016-04-12 NOTE — Progress Notes (Signed)
Patient was discharged to home with hospice. IV was removed. Family received education on meds and prevalone boots, AVS was also reviewed. Hospice was notified patient was returning home. Patient left floor via wheelchair, and was helped into families car with 02 tank from advanced. Patient confirmed they had all belongings.

## 2016-04-12 NOTE — Progress Notes (Signed)
Physician Discharge Summary  [name] [id number] DOB: July 02, 1945 DOA: 03/31/2016  PCP: Chevis Pretty, FNP  Admit date: 03/31/2016 Discharge date: 04/12/2016  Time spent: 25* minutes  Recommendations for Outpatient Follow-up:  1. Patient will be discharged home with Home hospice   Discharge Diagnoses:  Principal Problem:   Sepsis ([place]) Active Problems:   COPD (chronic obstructive pulmonary disease) (HCC)   HTN (hypertension)   RA (rheumatoid arthritis) ([place])   Acute respiratory failure with hypercapnia (HCC)   FTT (failure to thrive) in adult   Anxiety   Chronic diastolic CHF (congestive heart failure) (HCC)   Normocytic anemia   Acute on chronic respiratory failure ([place])   HCAP (healthcare-associated pneumonia)   Hypokalemia   Open leg wound   Sepsis due to pneumonia [facility])   Encounter for intubation   Discharge Condition: Stable  Diet recommendation: Dys 1 diet, comfort feed  Filed Weights   04/09/16 2224 04/10/16 2127 04/11/16 2110  Weight: 43.6 kg (96 lb 1.9 oz) 43.5 kg (96 lb) 42.6 kg (93 lb 14.7 oz)    History of present illness:  71 year old with historyCOPD on home O2, diastolic CHF, atrial fibrillation on full dose aspirin, protein calorie malnutrition, and recent admission (12/2015) for MRSA pneumonia requiring mechanical ventilation presented with generalized weakness, worsening cough productive of thick yellow sputum. Since her recent admissions she has never fully regained her strength and has been limited in her function at home, and has had a very poor appetite. Chest x-ray concerning for left upper lobe infiltrate. She was admitted to the hospitalist service for treatment of HCAP with broad-spectrum antibiotics and COPD exacerbation. Hospital course complicated by progressive lethargy, which progressed to near unresponsiveness on the morning of 1/16. ABG at that time significant for pH 7.16. PCCM was consulted,  patient was intubated from  04/02/1818 04/08/2016.  Hospital Course:  1. Acute on chronic hypercarbic respiratory failure- secondary to COPD, healthcare associated pneumonia. Now extubated. Continue oxygen.  Scheduled nebs. Will discharge on Prednisone taper.Start po  morphine 5 mg q 4 hr prn. 2. Healthcare associated pneumonia-  sputum culture growing staphylococci, Pseudomonas, with recent history of MRSA. Patient was started  on vancomycin and ciprofloxacin  On 04/09/16. She has completed 4 days of vancomycin. Will discharge home on Po Ciprofloxacin 500 mg po bid for 3 more days. 3. Atrial fibrillation- continue metoprolol 25 mg by mouth twice a day, she is not chronically  anticoagulated, continue aspirin. 4. Dysphagia- Patient is moderate aspiration risk, speech therapy recommends dysphagia 1 diet. 5. Encephalopathy- likely from hypercarbia. 6. Right hip pain- patient has been evaluated by PCP, infected total right greater trochanter. Per PCP notes patient and family declined orthopedics consultation, this was confirmed by the admitting physician at the time of admission on 03/31/2016. 7. Bilateral leg wounds- patient has nonhealing ulcers in bilateral lower extremities. Wound care was  Consulted in the hospital. 8. Normocytic anemia-chronic, hemoglobin was 9.1 on admission/FOBT negative. Status post 1 unit of PRBC on 04/03/2016. Hemoglobin is 6.9 today.B12, folate, ferritin, and iron-saturation normal in August 2017. Yesterday she was transfused one unit PRBC, this morning hemoglobin is 8.8 9. Goals of care- Discussed with patient's daughter and husband at bedside, patient has chronic respiratory failure, and has had multiple admissions to the hospital  over past few months. They understand that she has terminal condition  and every time she is admitted , she becomes more weak and lethargic. They want to shift her care to comfort and agreeable to home  with hospice.  Procedures: 1/14 Admit for MRSAPNA on vent. Sputum with  MRSA and psedumononas 1/16 ETT 1/16>>1/22 1/16 L IJ CVL 1/16>> 1/16 sputum - pseudomonas 1/22 Fam decided on DNR  Consultations:  PCCM  Discharge Exam: Vitals:   04/12/16 1017 04/12/16 1233  BP: 111/77 (!) 145/75  Pulse: 92 (!) 103  Resp: 18   Temp: 97.9 F (36.6 C)     General: Appears in no acute distress Cardiovascular: RRR, S1S2 Respiratory: Scattered wheezing bilaterally  Discharge Instructions   Discharge Instructions    Diet - low sodium heart healthy    Complete by:  As directed    Increase activity slowly    Complete by:  As directed      Current Discharge Medication List    START taking these medications   Details  ciprofloxacin (CIPRO) 500 MG tablet Take 1 tablet (500 mg total) by mouth 2 (two) times daily. Qty: 6 tablet, Refills: 0    ipratropium-albuterol (DUONEB) 0.5-2.5 (3) MG/3ML SOLN Take 3 mLs by nebulization every 4 (four) hours as needed. Qty: 360 mL, Refills: 2    morphine 20 MG/5ML solution Take 1.3 mLs (5.2 mg total) by mouth every 4 (four) hours as needed for pain. Qty: 100 mL, Refills: 0      CONTINUE these medications which have CHANGED   Details  ALPRAZolam (XANAX) 0.5 MG tablet Take 1 tablet (0.5 mg total) by mouth 2 (two) times daily as needed for anxiety. Qty: 30 tablet, Refills: 1   Associated Diagnoses: GAD (generalized anxiety disorder)    predniSONE (DELTASONE) 10 MG tablet Prednisone 40 mg po daily x 1 day then Prednisone 30 mg po daily x 1 day then Prednisone 20 mg po daily x 1 day then Prednisone 10 mg daily x 1 day then stop... Qty: 10 tablet, Refills: 0      CONTINUE these medications which have NOT CHANGED   Details  albuterol (PROAIR HFA) 108 (90 BASE) MCG/ACT inhaler INHALE 2 PUFFS INTO THE LUNGS EVERY 6 HOURS AS NEEDED FOR WHEEZING OR SHORTNESS OF BREATH Qty: 1 Inhaler, Refills: 2    amitriptyline (ELAVIL) 50 MG tablet Take 1 tablet by mouth at  bedtime Qty: 90 tablet, Refills: 1   Associated Diagnoses:  Insomnia    aspirin 325 MG tablet Take 325 mg by mouth every evening.     buPROPion (WELLBUTRIN XL) 150 MG 24 hr tablet TAKE 1 TABLET BY MOUTH  DAILY Qty: 90 tablet, Refills: 0    cetirizine (ALL DAY ALLERGY) 10 MG tablet Take 10 mg by mouth at bedtime.     cholecalciferol (VITAMIN D) 1000 UNITS tablet Take 1,000 Units by mouth every morning.     EQL CALCIUM CITRATE/VITAMIN D PO Take 1 each by mouth 2 (two) times daily. Vitamin D 500IU with Calcium Citrate 630mg   Chewables    furosemide (LASIX) 20 MG tablet Take 1 tablet by mouth  daily Qty: 90 tablet, Refills: 1    guaifenesin (MUCUS RELIEF) 400 MG TABS tablet Take 400 mg by mouth 2 (two) times daily.     metoprolol (LOPRESSOR) 50 MG tablet Take 1 tablet by mouth two  times daily Qty: 180 tablet, Refills: 1    potassium chloride SA (K-DUR,KLOR-CON) 20 MEQ tablet Take 2 tablets (40 mEq total) by mouth daily. Qty: 30 tablet, Refills: 0    tiotropium (SPIRIVA) 18 MCG inhalation capsule Place 1 capsule (18 mcg total) into inhaler and inhale every morning. Qty: 90 capsule, Refills: 0  Associated Diagnoses: COPD bronchitis    vitamin C (ASCORBIC ACID) 500 MG tablet Take 500 mg by mouth every morning.      STOP taking these medications     albuterol (PROVENTIL) (2.5 MG/3ML) 0.083% nebulizer solution      amoxicillin (AMOXIL) 875 MG tablet      HYDROcodone-acetaminophen (NORCO) 10-325 MG tablet      HYDROcodone-homatropine (HYCODAN) 5-1.5 MG/5ML syrup        Allergies  Allergen Reactions  . Celexa [Citalopram Hydrobromide] Other (See Comments)    "Jerky movements"  . Nickel     Unknown reaction  . Tramadol Hcl Nausea And Vomiting      The results of significant diagnostics from this hospitalization (including imaging, microbiology, ancillary and laboratory) are listed below for reference.    Significant Diagnostic Studies: Dg Chest 1 View  Result Date: 03/31/2016 CLINICAL DATA:  History of COPD, weakness right hip  pain EXAM: CHEST 1 VIEW COMPARISON:  01/02/2016, 10/16/2015, 03/31/2014 FINDINGS: Lungs are hyperinflated. Coarse interstitial opacities compatible with fibrosis. Bilateral upper lobe pleural and parenchymal scarring again noted with slight increased left upper lobe opacity. Bibasilar pleural and parenchymal scarring. Stable cardiomediastinal silhouette. No pneumothorax. IMPRESSION: 1. Hyperinflation with coarse, chronic interstitial changes within the apices and lung bases 2. Slight increased opacity in the apical portion of left upper lobe and suprahilar left lung, unable to exclude acute process/superimposed infiltrate in this region. Radiographic follow-up is recommended. Electronically Signed   By: Donavan Foil M.D.   On: 03/31/2016 20:34   Dg Chest Port 1 View  Result Date: 04/08/2016 CLINICAL DATA:  Hypoxia EXAM: PORTABLE CHEST 1 VIEW COMPARISON:  April 07, 2016 FINDINGS: Endotracheal tube tip is 3.8 cm above the carina. Central catheter tip is in the superior vena cava. Nasogastric tube tip and side port below the diaphragm. No pneumothorax. There is scarring with volume loss in both upper lobes, stable. Pleural effusions bilaterally remain stable with what appears to be a degree of loculated effusion on the right. There is patchy atelectasis in both lung bases. Heart size is within normal limits. The pulmonary vascularity is stable, distorted from the upper lobe cicatrization. There is aortic atherosclerosis. No evident adenopathy. No bone lesions peer IMPRESSION: Tube and catheter positions as described without pneumothorax. Extensive upper lobe scarring remains. Bilateral pleural effusions with bibasilar atelectasis, stable. No new opacity. Stable cardiac silhouette. Aortic atherosclerosis. Electronically Signed   By: Lowella Grip III M.D.   On: 04/08/2016 07:44   Dg Chest Port 1 View  Result Date: 04/07/2016 CLINICAL DATA:  Healthcare associated pneumonia. Repositioning of ET tube. EXAM:  PORTABLE CHEST 1 VIEW COMPARISON:  04/07/2016 FINDINGS: The endotracheal tube tip is positioned 2.9 cm above the carina. Left IJ catheter tip is in the cavoatrial junction. There is a nasogastric tube with tip well below the GE junction. Heart size is normal. There are bilateral pleural effusions right greater in left. Similar appearance of right lung base opacity. Extensive bilateral upper lobe scarring is unchanged. IMPRESSION: ET tube tip is now well positioned above the carina. No change in bilateral pleural effusions and right lung base opacity Electronically Signed   By: Kerby Moors M.D.   On: 04/07/2016 11:42   Dg Chest Port 1 View  Result Date: 04/07/2016 CLINICAL DATA:  Acute on chronic respiratory failure. EXAM: PORTABLE CHEST 1 VIEW COMPARISON:  April 06, 2016 FINDINGS: The ETT has been advanced and the distal tip is now at the origin of the right mainstem  bronchus. Recommend withdrawing 3 cm. Other support apparatus is stable. Scarring remains in the apices. No pneumothorax. Small bilateral effusions and underlying opacities remain. IMPRESSION: 1. The ET tube has been advanced and now terminates at the origin of the right mainstem bronchus. Recommend withdrawing 3 cm. 2. Stable scarring in the apices and stable small bilateral effusions with underlying opacities. These results will be called to the ordering clinician or representative by the Radiologist Assistant, and communication documented in the PACS or zVision Dashboard. Electronically Signed   By: Dorise Bullion III M.D   On: 04/07/2016 07:56   Dg Chest Port 1 View  Result Date: 04/06/2016 CLINICAL DATA:  ETT. EXAM: PORTABLE CHEST 1 VIEW COMPARISON:  April 05, 2016 FINDINGS: The left central line and ET tube are in good position. Volume loss and scarring are again seen in the apices. No pneumothorax. Small effusions with underlying atelectasis. No other interval changes. IMPRESSION: No interval change. Stable support apparatus.  Stable scarring and volume loss in the apices and stable small bilateral pleural effusions with underlying opacities. Electronically Signed   By: Dorise Bullion III M.D   On: 04/06/2016 07:13   Dg Chest Portable 1 View  Result Date: 04/05/2016 CLINICAL DATA:  Hypoxia EXAM: PORTABLE CHEST 1 VIEW COMPARISON:  April 04, 2016 FINDINGS: Endotracheal tube tip is 1.0 cm above the carina. Central catheter tip is in the superior vena cava. Nasogastric tube tip and side port below the diaphragm. No pneumothorax. There is cicatrization with volume loss in the upper lobes bilaterally. There are pleural effusions bilaterally with bibasilar patchy consolidation. No new opacity. Heart size normal. Distortion of the pulmonary vascularity bilaterally is stable. No obvious adenopathy. No evident bone lesions. IMPRESSION: Tube and catheter positions as described. Note that the endotracheal tube is near the carina and may need to be withdrawn 2-3 cm. No pneumothorax. Stable areas of volume loss with cicatrization in the upper lobes. Small pleural effusions with patchy consolidation both lung bases, stable. Stable cardiac silhouette. Electronically Signed   By: Lowella Grip III M.D.   On: 04/05/2016 06:49   Dg Chest Portable 1 View  Result Date: 04/04/2016 CLINICAL DATA:  Healthcare associated pneumonia EXAM: PORTABLE CHEST 1 VIEW COMPARISON:  04/03/2016 FINDINGS: Endotracheal tube just above the carina. Central venous catheter tip in the SVC. NG tube in the stomach. Apical scarring bilaterally is unchanged. Bibasilar airspace disease unchanged. Small bilateral effusions unchanged. IMPRESSION: Bibasilar infiltrates unchanged, possible pneumonia. Small bilateral effusions unchanged. Endotracheal tube just above the carina. Central venous catheter tip in the SVC. Electronically Signed   By: Franchot Gallo M.D.   On: 04/04/2016 06:40   Portable Chest Xray  Result Date: 04/03/2016 CLINICAL DATA:  Respiratory failure with  hypercapnia EXAM: PORTABLE CHEST 1 VIEW COMPARISON:  April 02, 2016 chest radiograph ; chest CT January 01, 2016 FINDINGS: Endotracheal tube tip is at the carina. Central catheter tip is in the superior vena cava. Nasogastric tubes tip and side port are below the diaphragm. No pneumothorax. Fibrosis in the upper lobe regions bilaterally remain. Fibrosis in the bases also is a stable finding. There are small pleural effusions bilaterally. There is chronic scarring along the lateral right base inferiorly. No new opacity. Heart size normal. Pulmonary vascularity is distorted by the upper lobe cicatrization, unchanged. No adenopathy evident. There is aortic atherosclerosis. IMPRESSION: Extensive lung fibrosis with areas of cicatrization, greatest in the upper lobes, stable. Small pleural effusions bilaterally. No new opacity. Stable cardiac silhouette. There is aortic  atherosclerosis. Tube And catheter positions as described. Note that the endotracheal tube tip is at the carina. Endotracheal tube should be withdrawn 2-3 cm. These results will be called to the ordering clinician or representative by the Radiologist Assistant, and communication documented in the PACS or zVision Dashboard. Electronically Signed   By: Lowella Grip III M.D.   On: 04/03/2016 07:33   Dg Chest Port 1 View  Result Date: 04/02/2016 CLINICAL DATA:  Intubation. EXAM: PORTABLE CHEST 1 VIEW COMPARISON:  03/31/2016. FINDINGS: Endotracheal tube, NG tube, left IJ line in good anatomic position. Biapical and bibasilar pleuroparenchymal thickening consistent with scarring. Heart size normal. IMPRESSION: 1. Lines and tubes in good anatomic position. 2. Biapical and bibasilar pleuroparenchymal thickening noted consistent with scarring. Electronically Signed   By: Marcello Moores  Register   On: 04/02/2016 11:23   Dg Foot Complete Left  Result Date: 03/21/2016 CLINICAL DATA:  Healing wounds after fall 3 weeks ago. EXAM: LEFT FOOT - COMPLETE 3+ VIEW  COMPARISON:  May 25, 2014 FINDINGS: There is no evidence of fracture or dislocation. There is no evidence of arthropathy or other focal bone abnormality. Soft tissues are unremarkable. IMPRESSION: Negative. Electronically Signed   By: Dorise Bullion III M.D   On: 03/21/2016 16:37   Dg Foot Complete Right  Result Date: 03/21/2016 CLINICAL DATA:  Healing sores on bilateral feet after fall 3 weeks ago. EXAM: RIGHT FOOT COMPLETE - 3+ VIEW COMPARISON:  None. FINDINGS: Vascular calcifications. No fractures identified. No bony erosion to suggest osteomyelitis. IMPRESSION: Negative. Electronically Signed   By: Dorise Bullion III M.D   On: 03/21/2016 16:32   Dg Swallowing Func-speech Pathology  Result Date: 04/10/2016 Objective Swallowing Evaluation: Type of Study: MBS-Modified Barium Swallow Study Patient Details Name: RADLEY ABRAMSON MRN: [id number] Date of Birth: 04-Dec-1945 Today's Date: 04/10/2016 Time: SLP Start Time (ACUTE ONLY): 1046-SLP Stop Time (ACUTE ONLY): 1107 SLP Time Calculation (min) (ACUTE ONLY): 21 min Past Medical History: Past Medical History: Diagnosis Date . Anemia  . Anxiety  . Anxiety disorder  . Arthritis   "pretty much all over" (01/05/2016) . Asthma  . Atrial fibrillation ([place])  . Avascular necrosis of hip ([place])   left . CHF (congestive heart failure) ([place])  . Chronic bronchitis ([place])  . Chronic lower back pain  . COPD (chronic obstructive pulmonary disease) ([place])  . Depression  . ETOH abuse  . Heart murmur   "think I outgrew it" (01/05/2016) . High cholesterol  . History of blood transfusion 12/2015  "while in ICU I think" (01/05/2016) . HTN (hypertension)  . Hyperglycemia  . On home O2   "2.5-3L 24/7" (01/05/2016) . Pneumonia   "several times" . Psoriatic arthritis ([place])   "we give her injections of Humera twice/month unless she's ill; think the last one she had was 08/23/2015" (01/05/2016) Past Surgical History: Past Surgical History: Procedure Laterality Date . JOINT REPLACEMENT   .  TONSILLECTOMY AND ADENOIDECTOMY   . TOTAL HIP ARTHROPLASTY Right 05/15/2008 . TUBAL LIGATION   HPI: [name] transferred from [facility] ED with septic shock due to staph areus thought to be due to osteomyelitis of Lt second toe - work up underway. Pt with intermittent Lt sided weakness upon presentation - Head CT without acute changes. PMH includes: CLL, DM, HTN, and h/o falls per H&P she was a "frequent flyer" in ED typically for falls. Pt reintubated on 04/02/16 and extubated on 04/08/16.  Subjective: alert, pleasant, very weak Assessment / Plan / Recommendation CHL IP  CLINICAL IMPRESSIONS 04/10/2016 Therapy Diagnosis Mild oral phase dysphagia;Mild pharyngeal phase dysphagia Clinical Impression Mrs. Veldhuizen exhibits mild oral dysphagia characterized by prolonged oral transit and oral holding during puree trial. Sensory based pharygneal dysphagia marked by delayed swallow initiation to the valleculae and silent penetration to the level of the vocal cords during the swallow with liquids via straw. Pt's dementia prohibited attempting compensatory techniques. Throat clearing during study without observation of penetrates/aspirates. Due to the increased effort required to [facility] and pt's current level of mentation, SLP recommends Dys 1 diet, thin liquids (no straw), meds crushed in puree. Will continue to f/u re: pt's swallowing function, diet management/tolerance, and pt's safety/efficiency. Impact on safety and function Moderate aspiration risk   CHL IP TREATMENT RECOMMENDATION 04/10/2016 Treatment Recommendations Therapy as outlined in treatment plan below   Prognosis 04/10/2016 Prognosis for Safe Diet Advancement Fair Barriers to Reach Goals Cognitive deficits Barriers/Prognosis Comment -- CHL IP DIET RECOMMENDATION 04/10/2016 SLP Diet Recommendations Dysphagia 1 (Puree) solids;Thin liquid;Other (Comment) Liquid Administration via Cup;No straw Medication Administration Crushed with puree Compensations Slow  rate;Minimize environmental distractions;Small sips/bites;Lingual sweep for clearance of pocketing Postural Changes Seated upright at 90 degrees   CHL IP OTHER RECOMMENDATIONS 04/10/2016 Recommended Consults -- Oral Care Recommendations Oral care BID Other Recommendations --   CHL IP FOLLOW UP RECOMMENDATIONS 04/10/2016 Follow up Recommendations Skilled Nursing facility   [facility] IP FREQUENCY AND DURATION 04/10/2016 Speech Therapy Frequency (ACUTE ONLY) min 2x/week Treatment Duration 2 weeks      CHL IP ORAL PHASE 04/10/2016 Oral Phase Impaired Oral - Pudding Teaspoon NT Oral - Pudding Cup -- Oral - Honey Teaspoon -- Oral - Honey Cup -- Oral - Nectar Teaspoon -- Oral - Nectar Cup -- Oral - Nectar Straw -- Oral - Thin Teaspoon -- Oral - Thin Cup -- Oral - Thin Straw -- Oral - Puree Holding of bolus;Delayed oral transit Oral - Mech Soft -- Oral - Regular WFL Oral - Multi-Consistency -- Oral - Pill -- Oral Phase - Comment --  CHL IP PHARYNGEAL PHASE 04/10/2016 Pharyngeal Phase Impaired Pharyngeal- Pudding Teaspoon -- Pharyngeal -- Pharyngeal- Pudding Cup -- Pharyngeal -- Pharyngeal- Honey Teaspoon -- Pharyngeal -- Pharyngeal- Honey Cup -- Pharyngeal -- Pharyngeal- Nectar Teaspoon -- Pharyngeal -- Pharyngeal- Nectar Cup -- Pharyngeal -- Pharyngeal- Nectar Straw -- Pharyngeal -- Pharyngeal- Thin Teaspoon -- Pharyngeal -- Pharyngeal- Thin Cup Delayed swallow initiation-vallecula Pharyngeal -- Pharyngeal- Thin Straw Delayed swallow initiation-vallecula;Penetration/Aspiration during swallow Pharyngeal Material enters airway, CONTACTS cords and not ejected out Pharyngeal- Puree -- Pharyngeal -- Pharyngeal- Mechanical Soft -- Pharyngeal -- Pharyngeal- Regular WFL Pharyngeal -- Pharyngeal- Multi-consistency -- Pharyngeal -- Pharyngeal- Pill -- Pharyngeal -- Pharyngeal Comment --  CHL IP CERVICAL ESOPHAGEAL PHASE 04/10/2016 Cervical Esophageal Phase WFL Pudding Teaspoon -- Pudding Cup -- Honey Teaspoon -- Honey Cup -- Nectar Teaspoon --  Nectar Cup -- Nectar Straw -- Thin Teaspoon -- Thin Cup -- Thin Straw -- Puree -- Mechanical Soft -- Regular -- Multi-consistency -- Pill -- Cervical Esophageal Comment -- No flowsheet data found. [place] Siren 04/10/2016, 3:19 PM  Orbie Pyo Colvin Caroli.Ed CCC-SLP Pager [phone]             Dg Hip Unilat With Pelvis 2-3 Views Right  Result Date: 03/31/2016 CLINICAL DATA:  Weakness with hip pain EXAM: DG HIP (WITH OR WITHOUT PELVIS) 2-3V RIGHT COMPARISON:  03/21/2016 FINDINGS: Probe overlies the lower pelvis. The SI joints are grossly symmetric. The left femoral head projects in joint. The pubic symphysis is intact. The  patient is status post right hip replacement without acute dislocation. Again visualized is a fracture of the right greater trochanter of the femur, now with mild superior displacement of the the trochanteric fracture fragment by approximately 9 mm from the lower trochanter and shaft of the right femur. Dense vascular calcifications in the right thigh. IMPRESSION: 1. Status post right hip replacement without acute dislocation 2. Fracture through the greater trochanter of the right femur with new superior displacement of greater trochanter fracture fragment as described above. Electronically Signed   By: Donavan Foil M.D.   On: 03/31/2016 20:38   Dg Hip Unilat W Or W/o Pelvis 2-3 Views Right  Result Date: 03/21/2016 CLINICAL DATA:  Pain and healing sores on bilateral feet after fall 3 weeks ago. Right hip pain. EXAM: DG HIP (WITH OR WITHOUT PELVIS) 2-3V RIGHT COMPARISON:  CT scan March 31, 2014 FINDINGS: The patient is status post right hip replacement. No dislocation. The hardware is intact. No lucency along the femoral component. There is lucency adjacent to the acetabular component. There is a fracture through the greater trochanter which is comminuted but not displaced. IMPRESSION: 1. Comminuted nondisplaced fracture of the right greater trochanter not seen in 2016 and thought to be  acute. 2. Status post right hip replacement. 3. Lucency adjacent to the acetabular component could be seen with particle disease or loosening. These results will be called to the ordering clinician or representative by the Radiologist Assistant, and communication documented in the PACS or zVision Dashboard. Electronically Signed   By: Dorise Bullion III M.D   On: 03/21/2016 16:31    Microbiology: Recent Results (from the past 240 hour(s))  Culture, expectorated sputum-assessment     Status: None   Collection Time: 04/07/16  3:53 PM  Result Value Ref Range Status   Specimen Description TRACHEAL ASPIRATE  Final   Special Requests NONE  Final   Sputum evaluation THIS SPECIMEN IS ACCEPTABLE FOR SPUTUM CULTURE  Final   Report Status 04/07/2016 FINAL  Final  Culture, respiratory (NON-Expectorated)     Status: None   Collection Time: 04/07/16  3:53 PM  Result Value Ref Range Status   Specimen Description TRACHEAL ASPIRATE  Final   Special Requests NONE Reflexed from [id number]  Final   Gram Stain   Final    ABUNDANT WBC PRESENT, PREDOMINANTLY PMN NO ORGANISMS SEEN    Culture Consistent with normal respiratory flora.  Final   Report Status 04/09/2016 FINAL  Final  Culture, blood (Routine X 2) w Reflex to ID Panel     Status: None (Preliminary result)   Collection Time: 04/08/16  7:00 AM  Result Value Ref Range Status   Specimen Description BLOOD BLOOD RIGHT HAND  Final   Special Requests IN PEDIATRIC BOTTLE 1CC  Final   Culture NO GROWTH 4 DAYS  Final   Report Status PENDING  Incomplete  Culture, blood (Routine X 2) w Reflex to ID Panel     Status: None (Preliminary result)   Collection Time: 04/08/16  7:12 AM  Result Value Ref Range Status   Specimen Description BLOOD BLOOD RIGHT ARM  Final   Special Requests IN PEDIATRIC BOTTLE 1CC  Final   Culture NO GROWTH 4 DAYS  Final   Report Status PENDING  Incomplete     Labs: Basic Metabolic Panel:  Recent Labs Lab 04/06/16 0439  04/06/16 0440 04/07/16 0243 04/07/16 0245 04/08/16 0425 04/09/16 0330 04/10/16 0640 04/12/16 0517  NA  --  139  --  140 141 141 139 141  K  --  3.6  --  3.5 2.7* 4.1 3.9 3.7  CL  --  99*  --  99* 100* 103 100* 100*  CO2  --  34*  --  35* 36* 35* 32 32  GLUCOSE  --  234*  --  142* 171* 79 84 61*  BUN  --  26*  --  26* 25* 21* 16 10  CREATININE  --  0.79  --  0.73 0.71 0.67 0.72 0.79  CALCIUM  --  7.7*  --  8.2* 7.8* 8.5* 9.0 8.4*  MG 1.3*  --  2.0  --  1.3* 2.3  --   --   PHOS 4.0 4.1  --  2.0* 1.9* 3.1  --   --    Liver Function Tests:  Recent Labs Lab 04/06/16 0440 04/07/16 0245 04/08/16 0425 04/09/16 0330  ALBUMIN 1.7* 1.8* 1.6* 1.8*   No results for input(s): LIPASE, AMYLASE in the last 168 hours. No results for input(s): AMMONIA in the last 168 hours. CBC:  Recent Labs Lab 04/06/16 0439 04/07/16 0243 04/08/16 0425 04/09/16 0330 04/10/16 0640 04/11/16 0512 04/11/16 1808 04/12/16 0517  WBC 10.7* 16.6* 11.1* 14.2* 16.3* 7.8 11.3* 9.1  NEUTROABS 10.0* 15.8* 9.6*  --   --   --   --   --   HGB 7.9* 8.2* 7.1* 7.4* 7.7* 6.9* 9.1* 8.8*  HCT 25.9* 26.9* 23.1* 24.2* 25.7* 23.1* 29.5* 28.0*  MCV 91.2 91.8 92.4 93.4 92.8 93.5 92.5 93.0  PLT 93* 128* 121* 153 289 214 239 220   Cardiac Enzymes: No results for input(s): CKTOTAL, CKMB, CKMBINDEX, TROPONINI in the last 168 hours. BNP: BNP (last 3 results)  Recent Labs  10/15/15 1629 12/29/15 1905 04/01/16 0239  BNP 181.0* 222.0* 381.1*    ProBNP (last 3 results) No results for input(s): PROBNP in the last 8760 hours.  CBG:  Recent Labs Lab 04/08/16 1631 04/08/16 1943 04/08/16 2345 04/09/16 0316 04/12/16 0800  GLUCAP 189* 76 84 72 71       Signed:  Jaionna Weisse S MD.  [facility] 04/12/2016, 2:11 PM

## 2016-04-12 NOTE — Progress Notes (Signed)
Pharmacy Antibiotic Note   71 y.o. female admitted on 03/31/2016 with sepsis.  Pt has had multiple admissions and has had separate episodes of a MRSA pneumonia and Pseudomonas pneumonia in the past year. Her respiratory culture is currently growing MRSA and pseudomonas. Pharmacy is on board for Vancomycin and Cipro dosing.   The patient's renal function has remained stable, SCr 0.79, CrCl~40-50 ml/min. Doses remain appropriate.    Plan: 1. Continue Vancomycin 1000 mg IV q24h 2. Continue Ciprofloxacin 400mg  IV q12h 3. Both antibiotics to end 1/28 4. Will continue to follow renal function, culture results, LOT, and antibiotic de-escalation plans   Height: 4\' 10"  (147.3 cm) Weight: 93 lb 14.7 oz (42.6 kg) IBW/kg (Calculated) : 40.9  Temp (24hrs), Avg:98.2 F (36.8 C), Min:97.9 F (36.6 C), Max:98.5 F (36.9 C)   Recent Labs Lab 04/07/16 0245 04/08/16 0425 04/08/16 2009 04/09/16 0330 04/10/16 0640 04/11/16 0512 04/11/16 1808 04/12/16 0517  WBC  --  11.1*  --  14.2* 16.3* 7.8 11.3* 9.1  CREATININE 0.73 0.71  --  0.67 0.72  --   --  0.79  VANCOTROUGH  --   --  13*  --   --   --   --   --     Estimated Creatinine Clearance: 41 mL/min (by C-G formula based on SCr of 0.79 mg/dL).    Allergies  Allergen Reactions  . Celexa [Citalopram Hydrobromide] Other (See Comments)    "Jerky movements"  . Nickel     Unknown reaction  . Tramadol Hcl Nausea And Vomiting   Cipro 1/18 >> (1/28) Cefepime 1/14 >> 1/18 Vancomycin 1/14 >> (1/28) Levaquin 1/15 >> 1/17  1/18 vt = 11 on 500 q24h 1/22 vt = 13 on 750 q24h  1/14 BCx: neg 1/15 UCx: neg 1/15 Sputum: MRSA, pseudomonas (pan-S) 1/16 TA: pseudomonas (pan-S) 1/15 MRSA PCR: positive 1/21 TA: nml flora 1/22 BCx: ngtd  Thank you for allowing pharmacy to be a part of this patient's care.  Alycia Rossetti, PharmD, BCPS Clinical Pharmacist Pager: [phone] Clinical phone for 04/12/2016 from 7a-3:30p: [phone] If after 3:30p, please  call main pharmacy at: [phone] 04/12/2016 1:53 PM

## 2016-04-12 NOTE — Progress Notes (Signed)
Speech Language Pathology Treatment: Dysphagia  Patient Details Name: [name] MRN: [id number] DOB: 05/19/1945 Today's Date: 04/12/2016 Time: [id number] SLP Time Calculation (min) (ACUTE ONLY): 22 min  Assessment / Plan / Recommendation Clinical Impression  Treatment focused on dysphagia/ diet tolerance. Upon entering room pt's voice was inconsistently aphonic with rattling respiratory sounds. RN informed; mod verbal/ visual cues required to encourage pt to cough which was productive with yellowish phlegm. Pt tolerated small trials of thin liquids by cup with hand-over-hand assist. Pt had a delayed cough x1 during these trials. Reviewed findings/ feeding strategies with RN of using hand over hand to increase liquid intake. Recommend continuing dysphagia 1/ thin liquids, meds crushed in puree, full supervision to assist with feeding, check for oral holding or pocketing. Noted that plan is for pt to return home with hospice. Will continue to follow for diet tolerance/ strategy review.    HPI HPI: [name] transferred from [facility] ED with septic shock due to staph areus thought to be due to osteomyelitis of Lt second toe - work up underway. Pt with intermittent Lt sided weakness upon presentation - Head CT without acute changes. PMH includes: CLL, DM, HTN, and h/o falls per H&P she was a "frequent flyer" in ED typically for falls. Pt reintubated on 04/02/16 and extubated on 04/08/16.       SLP Plan  Continue with current plan of care     Recommendations  Diet recommendations: Dysphagia 1 (puree);Thin liquid Liquids provided via: Cup;No straw Medication Administration: Crushed with puree Supervision: Staff to assist with self feeding;Full supervision/cueing for compensatory strategies Compensations: Slow rate;Minimize environmental distractions;Small sips/bites;Lingual sweep for clearance of pocketing Postural Changes and/or Swallow Maneuvers: Seated upright 90 degrees                 Oral Care Recommendations: Oral care BID Follow up Recommendations: 24 hour supervision/assistance Plan: Continue with current plan of care       Middleton, Amy K, [place], CCC-SLP 04/12/2016, 12:22 PM [phone]

## 2016-04-12 NOTE — Care Management Note (Signed)
Case Management Note  Patient Details  Name: [name] MRN: [id number] Date of Birth: [birth year]  Subjective/Objective:  CM following for progression and d/c planning. Plan of SNF changed and plan now is for home with hospice services.                   Action/Plan: 04/12/2016 10:20 am spoke with pt who gave this CM permission to contact her husband to begin planning of home health. Contacted pt husband, Bernadette Stuntz @ [phone], and discussed d/c needs. He states that the plan is for hospice services and he wishes to use [facility] [place]. This CM spoke briefly with Mr Bellanca about hospice services and he assured this CM that the family can provide care at home with the help of hospice services, he is aware that hospice does not stay for long periods with the patient. [facility] contacted , spoke with Butch Penny @ [phone] and all info faxed as requested to [phone]. Pt has a hospital bed, walker, 3:1 and oxygen at home. Family plans to provide transportation home, d/c is expected today.   Expected Discharge Date:      04/12/2016            Expected Discharge Plan:  Home w Hospice Care  In-House Referral:  Clinical Social Work  Discharge planning Services  CM Consult  Post Acute Care Choice:  NA Choice offered to:  NA  DME Arranged:  N/A DME Agency:  NA  HH Arranged:  RN, Nurse's Aide, Social Work [employer] Agency:  [facility]  Status of Service:  Completed, signed off  If discussed at [employer], dates discussed:    Additional Comments:  Adron Bene, RN 04/12/2016, 10:56 AM

## 2016-04-13 LAB — CULTURE, BLOOD (ROUTINE X 2)
CULTURE: NO GROWTH
CULTURE: NO GROWTH

## 2016-04-15 ENCOUNTER — Other Ambulatory Visit: Payer: Self-pay | Admitting: Pediatrics

## 2016-04-15 ENCOUNTER — Ambulatory Visit (HOSPITAL_COMMUNITY): Payer: Medicare Other | Admitting: Physical Therapy

## 2016-04-15 LAB — TYPE AND SCREEN
BLOOD PRODUCT EXPIRATION DATE: 201801312359
BLOOD PRODUCT EXPIRATION DATE: 201802182359
Blood Product Expiration Date: 201802182359
ISSUE DATE / TIME: 201801251030
UNIT TYPE AND RH: 600
Unit Type and Rh: 600
Unit Type and Rh: 600

## 2016-04-17 ENCOUNTER — Ambulatory Visit (HOSPITAL_COMMUNITY): Payer: Medicare Other | Admitting: Physical Therapy

## 2016-04-22 ENCOUNTER — Ambulatory Visit (HOSPITAL_COMMUNITY): Payer: Medicare Other | Admitting: Physical Therapy

## 2016-04-24 ENCOUNTER — Ambulatory Visit (HOSPITAL_COMMUNITY): Payer: Medicare Other | Admitting: Physical Therapy

## 2016-04-29 ENCOUNTER — Ambulatory Visit (HOSPITAL_COMMUNITY): Payer: Medicare Other | Admitting: Physical Therapy

## 2016-04-30 NOTE — Discharge Summary (Signed)
Physician Discharge Summary  MAKENLEE ASCHEMAN [id number] DOB: January 09, 1946 DOA: 03/31/2016  PCP: Chevis Pretty, FNP  Admit date: 03/31/2016 Discharge date: 04/12/2016  Time spent: 25* minutes  Recommendations for Outpatient Follow-up:  1. Patient will be discharged home with Home hospice   Discharge Diagnoses:  Principal Problem:   Sepsis ([place]) Active Problems:   COPD (chronic obstructive pulmonary disease) (HCC)   HTN (hypertension)   RA (rheumatoid arthritis) ([place])   Acute respiratory failure with hypercapnia (HCC)   FTT (failure to thrive) in adult   Anxiety   Chronic diastolic CHF (congestive heart failure) (HCC)   Normocytic anemia   Acute on chronic respiratory failure ([place])   HCAP (healthcare-associated pneumonia)   Hypokalemia   Open leg wound   Sepsis due to pneumonia [facility])   Encounter for intubation   Discharge Condition: Stable  Diet recommendation: Dys 1 diet, comfort feed       Filed Weights   04/09/16 2224 04/10/16 2127 04/11/16 2110  Weight: 43.6 kg (96 lb 1.9 oz) 43.5 kg (96 lb) 42.6 kg (93 lb 14.7 oz)    History of present illness:  71 year old with historyCOPD on home O2, diastolic CHF, atrial fibrillation on full dose aspirin, protein calorie malnutrition, and recent admission (12/2015) for MRSA pneumonia requiring mechanical ventilation presented with generalized weakness, worsening cough productive of thick yellow sputum. Since her recent admissions she has never fully regained her strength and has been limited in her function at home, and has had a very poor appetite. Chest x-ray concerning for left upper lobe infiltrate. She was admitted to the hospitalist service for treatment of HCAP with broad-spectrum antibiotics and COPD exacerbation. Hospital course complicated by progressive lethargy, which progressed to near unresponsiveness on the morning of 1/16. ABG at that time significant for pH 7.16. PCCM was consulted, patient was  intubated from 04/02/1818 04/08/2016.  Hospital Course:  1. Acute on chronic hypercarbic respiratory failure- secondary to COPD, healthcare associated pneumonia. Now extubated. Continue oxygen. Scheduled nebs. Will discharge on Prednisone taper.Start po  morphine 5 mg q 4 hr prn. 2. Healthcare associated pneumonia- sputum culture growing staphylococci, Pseudomonas, with recent history of MRSA. Patient was started  on vancomycin and ciprofloxacin  On 04/09/16. She has completed 4 days of vancomycin. Will discharge home on Po Ciprofloxacin 500 mg po bid for 3 more days. 3. Atrial fibrillation-continue metoprolol 25 mg by mouth twice a day, she is not chronically anticoagulated, continue aspirin. 4. Dysphagia- Patient is moderate aspiration risk, speech therapy recommends dysphagia 1 diet. 5. Encephalopathy-likely from hypercarbia. 6. Right hip pain- patient has been evaluated by PCP, infected total right greater trochanter. Per PCP notes patient and family declined orthopedics consultation, this was confirmed by the admitting physician at the time of admission on 03/31/2016. 7. Bilateral leg wounds- patient has nonhealing ulcers in bilateral lower extremities. Wound care was  Consulted in the hospital. 8. Normocytic anemia-chronic, hemoglobin was 9.1 on admission/FOBT negative. Status post 1 unit of PRBC on 04/03/2016. Hemoglobin is 6.9today.B12, folate, ferritin, and iron-saturation normal in August 2017. Yesterday she was transfused one unit PRBC, this morning hemoglobin is 8.8 9. Goals of care- Discussed with patient's daughter and husband at bedside, patient has chronic respiratory failure, and has had multiple admissions to the hospital  over past few months. They understand that she has terminal condition  and every time she is admitted , she becomes more weak and lethargic. They want to shift her care to comfort and agreeable to home with hospice.  Procedures: 1/14 Admit for MRSAPNA on  vent. Sputum with MRSA and psedumononas 1/16 ETT 1/16>>1/22 1/16 L IJ CVL 1/16>> 1/16 sputum - pseudomonas 1/22 Fam decided on DNR  Consultations:  PCCM  Discharge Exam:     Vitals:   04/12/16 1017 04/12/16 1233  BP: 111/77 (!) 145/75  Pulse: 92 (!) 103  Resp: 18   Temp: 97.9 F (36.6 C)     General: Appears in no acute distress Cardiovascular: RRR, S1S2 Respiratory: Scattered wheezing bilaterally  Discharge Instructions       Discharge Instructions    Diet - low sodium heart healthy    Complete by:  As directed    Increase activity slowly    Complete by:  As directed          Current Discharge Medication List        START taking these medications   Details  ciprofloxacin (CIPRO) 500 MG tablet Take 1 tablet (500 mg total) by mouth 2 (two) times daily. Qty: 6 tablet, Refills: 0    ipratropium-albuterol (DUONEB) 0.5-2.5 (3) MG/3ML SOLN Take 3 mLs by nebulization every 4 (four) hours as needed. Qty: 360 mL, Refills: 2    morphine 20 MG/5ML solution Take 1.3 mLs (5.2 mg total) by mouth every 4 (four) hours as needed for pain. Qty: 100 mL, Refills: 0          CONTINUE these medications which have CHANGED   Details  ALPRAZolam (XANAX) 0.5 MG tablet Take 1 tablet (0.5 mg total) by mouth 2 (two) times daily as needed for anxiety. Qty: 30 tablet, Refills: 1   Associated Diagnoses: GAD (generalized anxiety disorder)    predniSONE (DELTASONE) 10 MG tablet Prednisone 40 mg po daily x 1 day then Prednisone 30 mg po daily x 1 day then Prednisone 20 mg po daily x 1 day then Prednisone 10 mg daily x 1 day then stop... Qty: 10 tablet, Refills: 0          CONTINUE these medications which have NOT CHANGED   Details  albuterol (PROAIR HFA) 108 (90 BASE) MCG/ACT inhaler INHALE 2 PUFFS INTO THE LUNGS EVERY 6 HOURS AS NEEDED FOR WHEEZING OR SHORTNESS OF BREATH Qty: 1 Inhaler, Refills: 2    amitriptyline (ELAVIL) 50 MG tablet Take 1 tablet  by mouth at  bedtime Qty: 90 tablet, Refills: 1   Associated Diagnoses: Insomnia    aspirin 325 MG tablet Take 325 mg by mouth every evening.     buPROPion (WELLBUTRIN XL) 150 MG 24 hr tablet TAKE 1 TABLET BY MOUTH  DAILY Qty: 90 tablet, Refills: 0    cetirizine (ALL DAY ALLERGY) 10 MG tablet Take 10 mg by mouth at bedtime.     cholecalciferol (VITAMIN D) 1000 UNITS tablet Take 1,000 Units by mouth every morning.     EQL CALCIUM CITRATE/VITAMIN D PO Take 1 each by mouth 2 (two) times daily. Vitamin D 500IU with Calcium Citrate 630mg   Chewables    furosemide (LASIX) 20 MG tablet Take 1 tablet by mouth  daily Qty: 90 tablet, Refills: 1    guaifenesin (MUCUS RELIEF) 400 MG TABS tablet Take 400 mg by mouth 2 (two) times daily.     metoprolol (LOPRESSOR) 50 MG tablet Take 1 tablet by mouth two  times daily Qty: 180 tablet, Refills: 1    potassium chloride SA (K-DUR,KLOR-CON) 20 MEQ tablet Take 2 tablets (40 mEq total) by mouth daily. Qty: 30 tablet, Refills: 0    tiotropium (SPIRIVA) 18  MCG inhalation capsule Place 1 capsule (18 mcg total) into inhaler and inhale every morning. Qty: 90 capsule, Refills: 0   Associated Diagnoses: COPD bronchitis    vitamin C (ASCORBIC ACID) 500 MG tablet Take 500 mg by mouth every morning.         STOP taking these medications     albuterol (PROVENTIL) (2.5 MG/3ML) 0.083% nebulizer solution      amoxicillin (AMOXIL) 875 MG tablet      HYDROcodone-acetaminophen (NORCO) 10-325 MG tablet      HYDROcodone-homatropine (HYCODAN) 5-1.5 MG/5ML syrup             Allergies  Allergen Reactions  . Celexa [Citalopram Hydrobromide] Other (See Comments)    "Jerky movements"  . Nickel     Unknown reaction  . Tramadol Hcl Nausea And Vomiting      The results of significant diagnostics from this hospitalization (including imaging, microbiology, ancillary and laboratory) are listed below for reference.     Significant Diagnostic Studies:  Imaging Results  Dg Chest 1 View  Result Date: 03/31/2016 CLINICAL DATA:  History of COPD, weakness right hip pain EXAM: CHEST 1 VIEW COMPARISON:  01/02/2016, 10/16/2015, 03/31/2014 FINDINGS: Lungs are hyperinflated. Coarse interstitial opacities compatible with fibrosis. Bilateral upper lobe pleural and parenchymal scarring again noted with slight increased left upper lobe opacity. Bibasilar pleural and parenchymal scarring. Stable cardiomediastinal silhouette. No pneumothorax. IMPRESSION: 1. Hyperinflation with coarse, chronic interstitial changes within the apices and lung bases 2. Slight increased opacity in the apical portion of left upper lobe and suprahilar left lung, unable to exclude acute process/superimposed infiltrate in this region. Radiographic follow-up is recommended. Electronically Signed   By: Donavan Foil M.D.   On: 03/31/2016 20:34   Dg Chest Port 1 View  Result Date: 04/08/2016 CLINICAL DATA:  Hypoxia EXAM: PORTABLE CHEST 1 VIEW COMPARISON:  April 07, 2016 FINDINGS: Endotracheal tube tip is 3.8 cm above the carina. Central catheter tip is in the superior vena cava. Nasogastric tube tip and side port below the diaphragm. No pneumothorax. There is scarring with volume loss in both upper lobes, stable. Pleural effusions bilaterally remain stable with what appears to be a degree of loculated effusion on the right. There is patchy atelectasis in both lung bases. Heart size is within normal limits. The pulmonary vascularity is stable, distorted from the upper lobe cicatrization. There is aortic atherosclerosis. No evident adenopathy. No bone lesions peer IMPRESSION: Tube and catheter positions as described without pneumothorax. Extensive upper lobe scarring remains. Bilateral pleural effusions with bibasilar atelectasis, stable. No new opacity. Stable cardiac silhouette. Aortic atherosclerosis. Electronically Signed   By: Lowella Grip III M.D.    On: 04/08/2016 07:44   Dg Chest Port 1 View  Result Date: 04/07/2016 CLINICAL DATA:  Healthcare associated pneumonia. Repositioning of ET tube. EXAM: PORTABLE CHEST 1 VIEW COMPARISON:  04/07/2016 FINDINGS: The endotracheal tube tip is positioned 2.9 cm above the carina. Left IJ catheter tip is in the cavoatrial junction. There is a nasogastric tube with tip well below the GE junction. Heart size is normal. There are bilateral pleural effusions right greater in left. Similar appearance of right lung base opacity. Extensive bilateral upper lobe scarring is unchanged. IMPRESSION: ET tube tip is now well positioned above the carina. No change in bilateral pleural effusions and right lung base opacity Electronically Signed   By: Kerby Moors M.D.   On: 04/07/2016 11:42   Dg Chest Port 1 View  Result Date: 04/07/2016 CLINICAL DATA:  Acute on chronic respiratory failure. EXAM: PORTABLE CHEST 1 VIEW COMPARISON:  April 06, 2016 FINDINGS: The ETT has been advanced and the distal tip is now at the origin of the right mainstem bronchus. Recommend withdrawing 3 cm. Other support apparatus is stable. Scarring remains in the apices. No pneumothorax. Small bilateral effusions and underlying opacities remain. IMPRESSION: 1. The ET tube has been advanced and now terminates at the origin of the right mainstem bronchus. Recommend withdrawing 3 cm. 2. Stable scarring in the apices and stable small bilateral effusions with underlying opacities. These results will be called to the ordering clinician or representative by the Radiologist Assistant, and communication documented in the PACS or zVision Dashboard. Electronically Signed   By: Dorise Bullion III M.D   On: 04/07/2016 07:56   Dg Chest Port 1 View  Result Date: 04/06/2016 CLINICAL DATA:  ETT. EXAM: PORTABLE CHEST 1 VIEW COMPARISON:  April 05, 2016 FINDINGS: The left central line and ET tube are in good position. Volume loss and scarring are again seen in  the apices. No pneumothorax. Small effusions with underlying atelectasis. No other interval changes. IMPRESSION: No interval change. Stable support apparatus. Stable scarring and volume loss in the apices and stable small bilateral pleural effusions with underlying opacities. Electronically Signed   By: Dorise Bullion III M.D   On: 04/06/2016 07:13   Dg Chest Portable 1 View  Result Date: 04/05/2016 CLINICAL DATA:  Hypoxia EXAM: PORTABLE CHEST 1 VIEW COMPARISON:  April 04, 2016 FINDINGS: Endotracheal tube tip is 1.0 cm above the carina. Central catheter tip is in the superior vena cava. Nasogastric tube tip and side port below the diaphragm. No pneumothorax. There is cicatrization with volume loss in the upper lobes bilaterally. There are pleural effusions bilaterally with bibasilar patchy consolidation. No new opacity. Heart size normal. Distortion of the pulmonary vascularity bilaterally is stable. No obvious adenopathy. No evident bone lesions. IMPRESSION: Tube and catheter positions as described. Note that the endotracheal tube is near the carina and may need to be withdrawn 2-3 cm. No pneumothorax. Stable areas of volume loss with cicatrization in the upper lobes. Small pleural effusions with patchy consolidation both lung bases, stable. Stable cardiac silhouette. Electronically Signed   By: Lowella Grip III M.D.   On: 04/05/2016 06:49   Dg Chest Portable 1 View  Result Date: 04/04/2016 CLINICAL DATA:  Healthcare associated pneumonia EXAM: PORTABLE CHEST 1 VIEW COMPARISON:  04/03/2016 FINDINGS: Endotracheal tube just above the carina. Central venous catheter tip in the SVC. NG tube in the stomach. Apical scarring bilaterally is unchanged. Bibasilar airspace disease unchanged. Small bilateral effusions unchanged. IMPRESSION: Bibasilar infiltrates unchanged, possible pneumonia. Small bilateral effusions unchanged. Endotracheal tube just above the carina. Central venous catheter tip in the SVC.  Electronically Signed   By: Franchot Gallo M.D.   On: 04/04/2016 06:40   Portable Chest Xray  Result Date: 04/03/2016 CLINICAL DATA:  Respiratory failure with hypercapnia EXAM: PORTABLE CHEST 1 VIEW COMPARISON:  April 02, 2016 chest radiograph ; chest CT January 01, 2016 FINDINGS: Endotracheal tube tip is at the carina. Central catheter tip is in the superior vena cava. Nasogastric tubes tip and side port are below the diaphragm. No pneumothorax. Fibrosis in the upper lobe regions bilaterally remain. Fibrosis in the bases also is a stable finding. There are small pleural effusions bilaterally. There is chronic scarring along the lateral right base inferiorly. No new opacity. Heart size normal. Pulmonary vascularity is distorted by the upper lobe cicatrization, unchanged.  No adenopathy evident. There is aortic atherosclerosis. IMPRESSION: Extensive lung fibrosis with areas of cicatrization, greatest in the upper lobes, stable. Small pleural effusions bilaterally. No new opacity. Stable cardiac silhouette. There is aortic atherosclerosis. Tube And catheter positions as described. Note that the endotracheal tube tip is at the carina. Endotracheal tube should be withdrawn 2-3 cm. These results will be called to the ordering clinician or representative by the Radiologist Assistant, and communication documented in the PACS or zVision Dashboard. Electronically Signed   By: Lowella Grip III M.D.   On: 04/03/2016 07:33   Dg Chest Port 1 View  Result Date: 04/02/2016 CLINICAL DATA:  Intubation. EXAM: PORTABLE CHEST 1 VIEW COMPARISON:  03/31/2016. FINDINGS: Endotracheal tube, NG tube, left IJ line in good anatomic position. Biapical and bibasilar pleuroparenchymal thickening consistent with scarring. Heart size normal. IMPRESSION: 1. Lines and tubes in good anatomic position. 2. Biapical and bibasilar pleuroparenchymal thickening noted consistent with scarring. Electronically Signed   By: Marcello Moores  Register    On: 04/02/2016 11:23   Dg Foot Complete Left  Result Date: 03/21/2016 CLINICAL DATA:  Healing wounds after fall 3 weeks ago. EXAM: LEFT FOOT - COMPLETE 3+ VIEW COMPARISON:  May 25, 2014 FINDINGS: There is no evidence of fracture or dislocation. There is no evidence of arthropathy or other focal bone abnormality. Soft tissues are unremarkable. IMPRESSION: Negative. Electronically Signed   By: Dorise Bullion III M.D   On: 03/21/2016 16:37   Dg Foot Complete Right  Result Date: 03/21/2016 CLINICAL DATA:  Healing sores on bilateral feet after fall 3 weeks ago. EXAM: RIGHT FOOT COMPLETE - 3+ VIEW COMPARISON:  None. FINDINGS: Vascular calcifications. No fractures identified. No bony erosion to suggest osteomyelitis. IMPRESSION: Negative. Electronically Signed   By: Dorise Bullion III M.D   On: 03/21/2016 16:32   Dg Swallowing Func-speech Pathology  Result Date: 04/10/2016 Objective Swallowing Evaluation: Type of Study: MBS-Modified Barium Swallow Study Patient Details Name: GLENDI TOTZKE MRN: [id number] Date of Birth: [birth year] Today's Date: 04/10/2016 Time: SLP Start Time (ACUTE ONLY): 1046-SLP Stop Time (ACUTE ONLY): 1107 SLP Time Calculation (min) (ACUTE ONLY): 21 min Past Medical History: Past Medical History: Diagnosis Date . Anemia  . Anxiety  . Anxiety disorder  . Arthritis   "pretty much all over" (01/05/2016) . Asthma  . Atrial fibrillation ([place])  . Avascular necrosis of hip ([place])   left . CHF (congestive heart failure) ([place])  . Chronic bronchitis ([place])  . Chronic lower back pain  . COPD (chronic obstructive pulmonary disease) ([place])  . Depression  . ETOH abuse  . Heart murmur   "think I outgrew it" (01/05/2016) . High cholesterol  . History of blood transfusion 12/2015  "while in ICU I think" (01/05/2016) . HTN (hypertension)  . Hyperglycemia  . On home O2   "2.5-3L 24/7" (01/05/2016) . Pneumonia   "several times" . Psoriatic arthritis ([place])   "we give her injections of Humera twice/month  unless she's ill; think the last one she had was 08/23/2015" (01/05/2016) Past Surgical History: Past Surgical History: Procedure Laterality Date . JOINT REPLACEMENT   . TONSILLECTOMY AND ADENOIDECTOMY   . TOTAL HIP ARTHROPLASTY Right 05/15/2008 . TUBAL LIGATION   HPI: [name] transferred from [facility] ED with septic shock due to staph areus thought to be due to osteomyelitis of Lt second toe - work up underway. Pt with intermittent Lt sided weakness upon presentation - Head CT without acute changes. PMH includes: CLL, DM, HTN, and h/o  falls per H&P she was a "frequent flyer" in ED typically for falls. Pt reintubated on 04/02/16 and extubated on 04/08/16.  Subjective: alert, pleasant, very weak Assessment / Plan / Recommendation CHL IP CLINICAL IMPRESSIONS 04/10/2016 Therapy Diagnosis Mild oral phase dysphagia;Mild pharyngeal phase dysphagia Clinical Impression Mrs. Asbill exhibits mild oral dysphagia characterized by prolonged oral transit and oral holding during puree trial. Sensory based pharygneal dysphagia marked by delayed swallow initiation to the valleculae and silent penetration to the level of the vocal cords during the swallow with liquids via straw. Pt's dementia prohibited attempting compensatory techniques. Throat clearing during study without observation of penetrates/aspirates. Due to the increased effort required to [facility] and pt's current level of mentation, SLP recommends Dys 1 diet, thin liquids (no straw), meds crushed in puree. Will continue to f/u re: pt's swallowing function, diet management/tolerance, and pt's safety/efficiency. Impact on safety and function Moderate aspiration risk   CHL IP TREATMENT RECOMMENDATION 04/10/2016 Treatment Recommendations Therapy as outlined in treatment plan below   Prognosis 04/10/2016 Prognosis for Safe Diet Advancement Fair Barriers to Reach Goals Cognitive deficits Barriers/Prognosis Comment -- CHL IP DIET RECOMMENDATION 04/10/2016 SLP Diet  Recommendations Dysphagia 1 (Puree) solids;Thin liquid;Other (Comment) Liquid Administration via Cup;No straw Medication Administration Crushed with puree Compensations Slow rate;Minimize environmental distractions;Small sips/bites;Lingual sweep for clearance of pocketing Postural Changes Seated upright at 90 degrees   CHL IP OTHER RECOMMENDATIONS 04/10/2016 Recommended Consults -- Oral Care Recommendations Oral care BID Other Recommendations --   CHL IP FOLLOW UP RECOMMENDATIONS 04/10/2016 Follow up Recommendations Skilled Nursing facility   [facility] IP FREQUENCY AND DURATION 04/10/2016 Speech Therapy Frequency (ACUTE ONLY) min 2x/week Treatment Duration 2 weeks      CHL IP ORAL PHASE 04/10/2016 Oral Phase Impaired Oral - Pudding Teaspoon NT Oral - Pudding Cup -- Oral - Honey Teaspoon -- Oral - Honey Cup -- Oral - Nectar Teaspoon -- Oral - Nectar Cup -- Oral - Nectar Straw -- Oral - Thin Teaspoon -- Oral - Thin Cup -- Oral - Thin Straw -- Oral - Puree Holding of bolus;Delayed oral transit Oral - Mech Soft -- Oral - Regular WFL Oral - Multi-Consistency -- Oral - Pill -- Oral Phase - Comment --  CHL IP PHARYNGEAL PHASE 04/10/2016 Pharyngeal Phase Impaired Pharyngeal- Pudding Teaspoon -- Pharyngeal -- Pharyngeal- Pudding Cup -- Pharyngeal -- Pharyngeal- Honey Teaspoon -- Pharyngeal -- Pharyngeal- Honey Cup -- Pharyngeal -- Pharyngeal- Nectar Teaspoon -- Pharyngeal -- Pharyngeal- Nectar Cup -- Pharyngeal -- Pharyngeal- Nectar Straw -- Pharyngeal -- Pharyngeal- Thin Teaspoon -- Pharyngeal -- Pharyngeal- Thin Cup Delayed swallow initiation-vallecula Pharyngeal -- Pharyngeal- Thin Straw Delayed swallow initiation-vallecula;Penetration/Aspiration during swallow Pharyngeal Material enters airway, CONTACTS cords and not ejected out Pharyngeal- Puree -- Pharyngeal -- Pharyngeal- Mechanical Soft -- Pharyngeal -- Pharyngeal- Regular WFL Pharyngeal -- Pharyngeal- Multi-consistency -- Pharyngeal -- Pharyngeal- Pill -- Pharyngeal --  Pharyngeal Comment --  CHL IP CERVICAL ESOPHAGEAL PHASE 04/10/2016 Cervical Esophageal Phase WFL Pudding Teaspoon -- Pudding Cup -- Honey Teaspoon -- Honey Cup -- Nectar Teaspoon -- Nectar Cup -- Nectar Straw -- Thin Teaspoon -- Thin Cup -- Thin Straw -- Puree -- Mechanical Soft -- Regular -- Multi-consistency -- Pill -- Cervical Esophageal Comment -- No flowsheet data found. [place] Siren 04/10/2016, 3:19 PM  Orbie Pyo Colvin Caroli.Ed CCC-SLP Pager [phone]             Dg Hip Unilat With Pelvis 2-3 Views Right  Result Date: 03/31/2016 CLINICAL DATA:  Weakness with hip pain EXAM: DG HIP (  WITH OR WITHOUT PELVIS) 2-3V RIGHT COMPARISON:  03/21/2016 FINDINGS: Probe overlies the lower pelvis. The SI joints are grossly symmetric. The left femoral head projects in joint. The pubic symphysis is intact. The patient is status post right hip replacement without acute dislocation. Again visualized is a fracture of the right greater trochanter of the femur, now with mild superior displacement of the the trochanteric fracture fragment by approximately 9 mm from the lower trochanter and shaft of the right femur. Dense vascular calcifications in the right thigh. IMPRESSION: 1. Status post right hip replacement without acute dislocation 2. Fracture through the greater trochanter of the right femur with new superior displacement of greater trochanter fracture fragment as described above. Electronically Signed   By: Donavan Foil M.D.   On: 03/31/2016 20:38   Dg Hip Unilat W Or W/o Pelvis 2-3 Views Right  Result Date: 03/21/2016 CLINICAL DATA:  Pain and healing sores on bilateral feet after fall 3 weeks ago. Right hip pain. EXAM: DG HIP (WITH OR WITHOUT PELVIS) 2-3V RIGHT COMPARISON:  CT scan March 31, 2014 FINDINGS: The patient is status post right hip replacement. No dislocation. The hardware is intact. No lucency along the femoral component. There is lucency adjacent to the acetabular component. There is a fracture  through the greater trochanter which is comminuted but not displaced. IMPRESSION: 1. Comminuted nondisplaced fracture of the right greater trochanter not seen in 2016 and thought to be acute. 2. Status post right hip replacement. 3. Lucency adjacent to the acetabular component could be seen with particle disease or loosening. These results will be called to the ordering clinician or representative by the Radiologist Assistant, and communication documented in the PACS or zVision Dashboard. Electronically Signed   By: Dorise Bullion III M.D   On: 03/21/2016 16:31     Microbiology:        Recent Results (from the past 240 hour(s))  Culture, expectorated sputum-assessment     Status: None   Collection Time: 04/07/16  3:53 PM  Result Value Ref Range Status   Specimen Description TRACHEAL ASPIRATE  Final   Special Requests NONE  Final   Sputum evaluation THIS SPECIMEN IS ACCEPTABLE FOR SPUTUM CULTURE  Final   Report Status 04/07/2016 FINAL  Final  Culture, respiratory (NON-Expectorated)     Status: None   Collection Time: 04/07/16  3:53 PM  Result Value Ref Range Status   Specimen Description TRACHEAL ASPIRATE  Final   Special Requests NONE Reflexed from [id number]  Final   Gram Stain   Final    ABUNDANT WBC PRESENT, PREDOMINANTLY PMN NO ORGANISMS SEEN    Culture Consistent with normal respiratory flora.  Final   Report Status 04/09/2016 FINAL  Final  Culture, blood (Routine X 2) w Reflex to ID Panel     Status: None (Preliminary result)   Collection Time: 04/08/16  7:00 AM  Result Value Ref Range Status   Specimen Description BLOOD BLOOD RIGHT HAND  Final   Special Requests IN PEDIATRIC BOTTLE 1CC  Final   Culture NO GROWTH 4 DAYS  Final   Report Status PENDING  Incomplete  Culture, blood (Routine X 2) w Reflex to ID Panel     Status: None (Preliminary result)   Collection Time: 04/08/16  7:12 AM  Result Value Ref Range Status   Specimen Description  BLOOD BLOOD RIGHT ARM  Final   Special Requests IN PEDIATRIC BOTTLE [place]  Final   Culture NO GROWTH 4 DAYS  Final  Report Status PENDING  Incomplete     Labs: Basic Metabolic Panel:  Last Labs    Recent Labs Lab 04/06/16 0439 04/06/16 0440 04/07/16 0243 04/07/16 0245 04/08/16 0425 04/09/16 0330 04/10/16 0640 04/12/16 0517  NA  --  139  --  140 141 141 139 141  K  --  3.6  --  3.5 2.7* 4.1 3.9 3.7  CL  --  99*  --  99* 100* 103 100* 100*  CO2  --  34*  --  35* 36* 35* 32 32  GLUCOSE  --  234*  --  142* 171* 79 84 61*  BUN  --  26*  --  26* 25* 21* 16 10  CREATININE  --  0.79  --  0.73 0.71 0.67 0.72 0.79  CALCIUM  --  7.7*  --  8.2* 7.8* 8.5* 9.0 8.4*  MG 1.3*  --  2.0  --  1.3* 2.3  --   --   PHOS 4.0 4.1  --  2.0* 1.9* 3.1  --   --      Liver Function Tests:  Last Labs    Recent Labs Lab 04/06/16 0440 04/07/16 0245 04/08/16 0425 04/09/16 0330  ALBUMIN 1.7* 1.8* 1.6* 1.8*     Last Labs   No results for input(s): LIPASE, AMYLASE in the last 168 hours.   Last Labs   No results for input(s): AMMONIA in the last 168 hours.   CBC:  Last Labs    Recent Labs Lab 04/06/16 0439 04/07/16 0243 04/08/16 0425 04/09/16 0330 04/10/16 0640 04/11/16 0512 04/11/16 1808 04/12/16 0517  WBC 10.7* 16.6* 11.1* 14.2* 16.3* 7.8 11.3* 9.1  NEUTROABS 10.0* 15.8* 9.6*  --   --   --   --   --   HGB 7.9* 8.2* 7.1* 7.4* 7.7* 6.9* 9.1* 8.8*  HCT 25.9* 26.9* 23.1* 24.2* 25.7* 23.1* 29.5* 28.0*  MCV 91.2 91.8 92.4 93.4 92.8 93.5 92.5 93.0  PLT 93* 128* 121* 153 289 214 239 220     Cardiac Enzymes: Last Labs   No results for input(s): CKTOTAL, CKMB, CKMBINDEX, TROPONINI in the last 168 hours.   BNP: BNP (last 3 results)  Recent Labs (within last 365 days)   Recent Labs  10/15/15 1629 12/29/15 1905 04/01/16 0239  BNP 181.0* 222.0* 381.1*      ProBNP (last 3 results) Recent Labs (within last 365 days)  No results for input(s): PROBNP in the  last 8760 hours.    CBG:  Last Labs    Recent Labs Lab 04/08/16 1631 04/08/16 1943 04/08/16 2345 04/09/16 0316 04/12/16 0800  GLUCAP 189* 76 84 72 71         Signed:  Daegon Deiss S MD.  [facility] 04/12/2016, 2:11 PM

## 2016-05-01 ENCOUNTER — Ambulatory Visit (HOSPITAL_COMMUNITY): Payer: Medicare Other | Admitting: Physical Therapy

## 2016-05-06 ENCOUNTER — Ambulatory Visit (HOSPITAL_COMMUNITY): Payer: Medicare Other | Admitting: Physical Therapy

## 2016-05-08 ENCOUNTER — Ambulatory Visit (HOSPITAL_COMMUNITY): Payer: Medicare Other | Admitting: Physical Therapy

## 2016-05-16 DEATH — deceased

## 2017-02-10 IMAGING — DX DG CHEST 2V
2 series · 2 of 2 positions shown · non-contrast
Comparison: 10/18/2015 and 10/17/2015 radiographs.  CT 03/31/2014.

CLINICAL DATA: Low hemoglobin.

EXAM:
CHEST  2 VIEW

[chest lat]
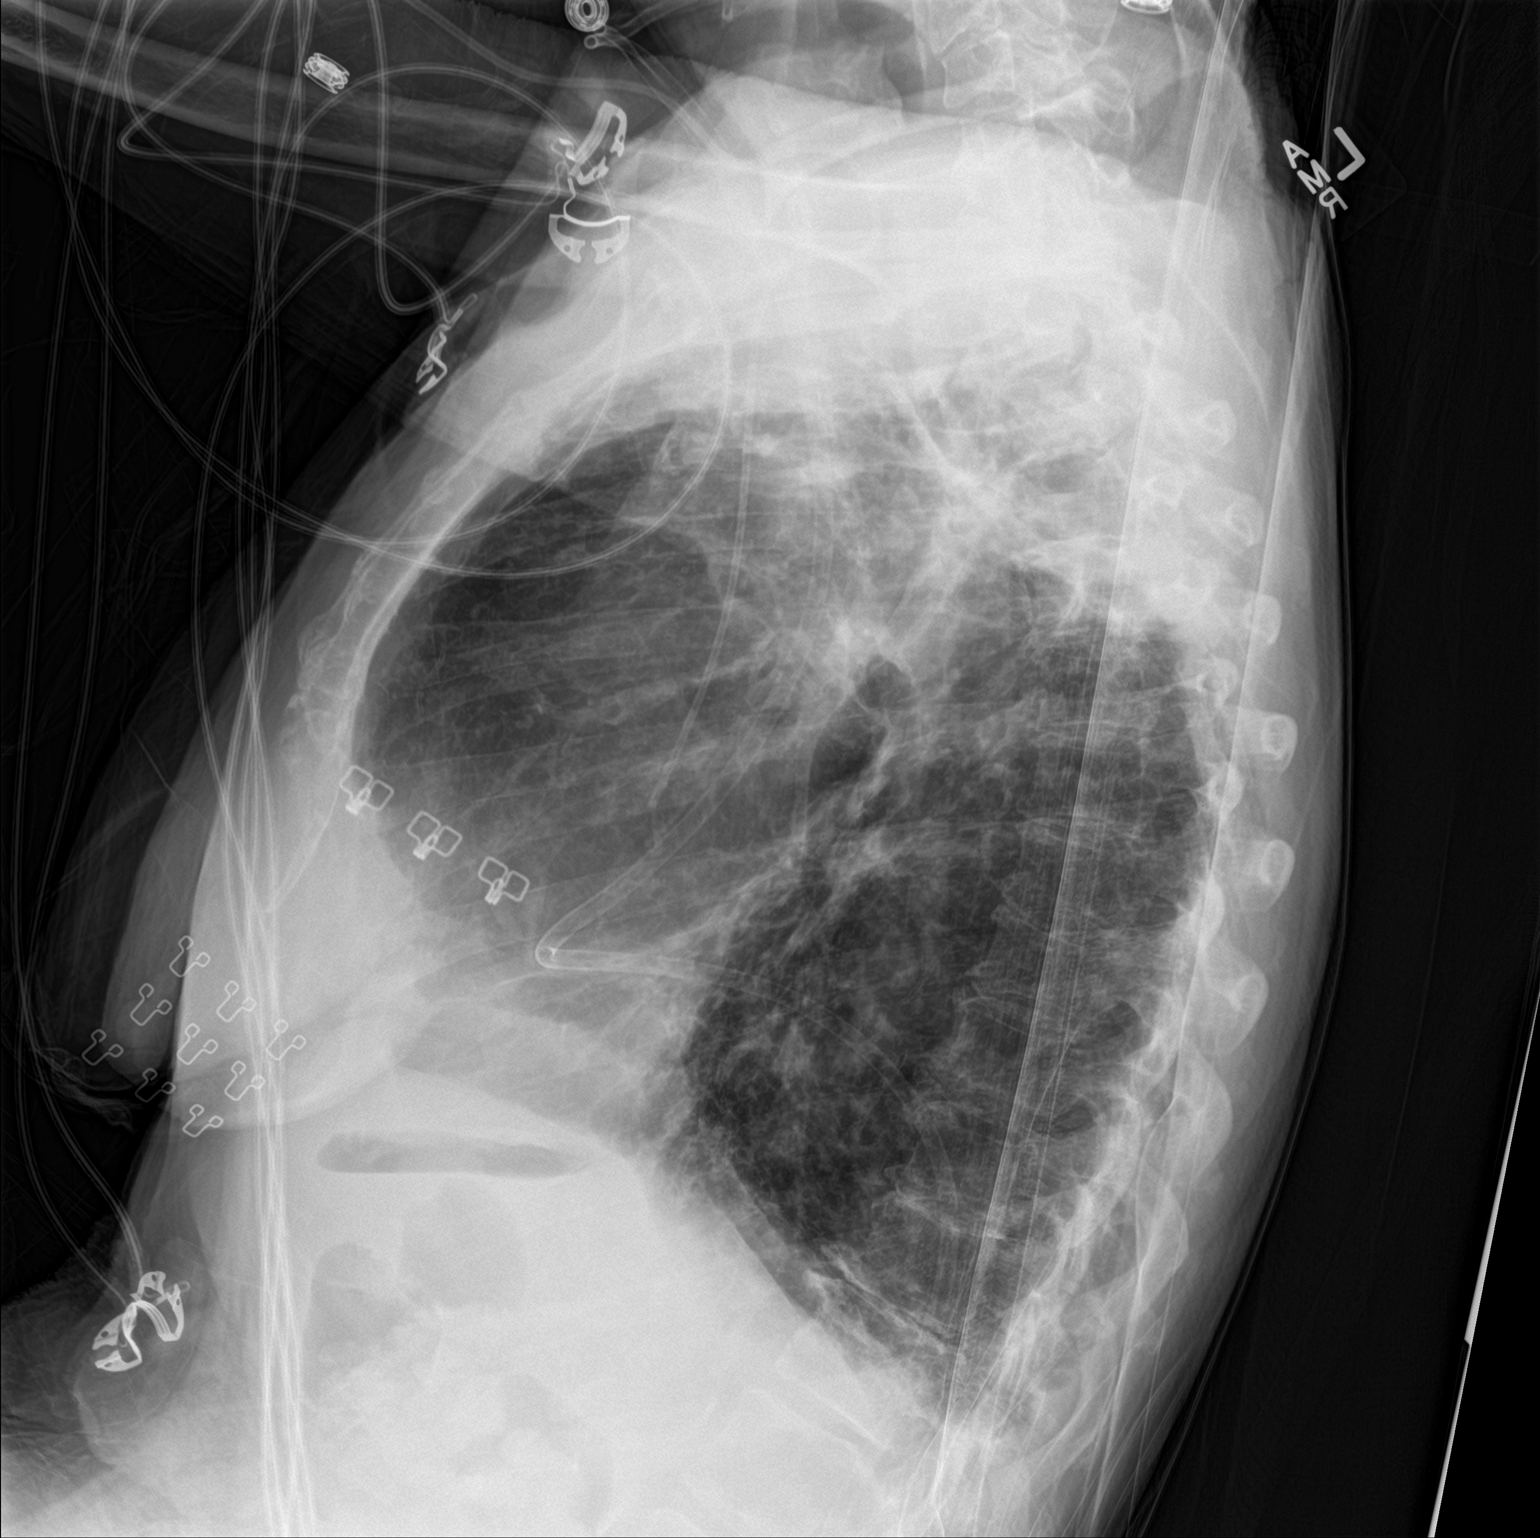

[chest ap]
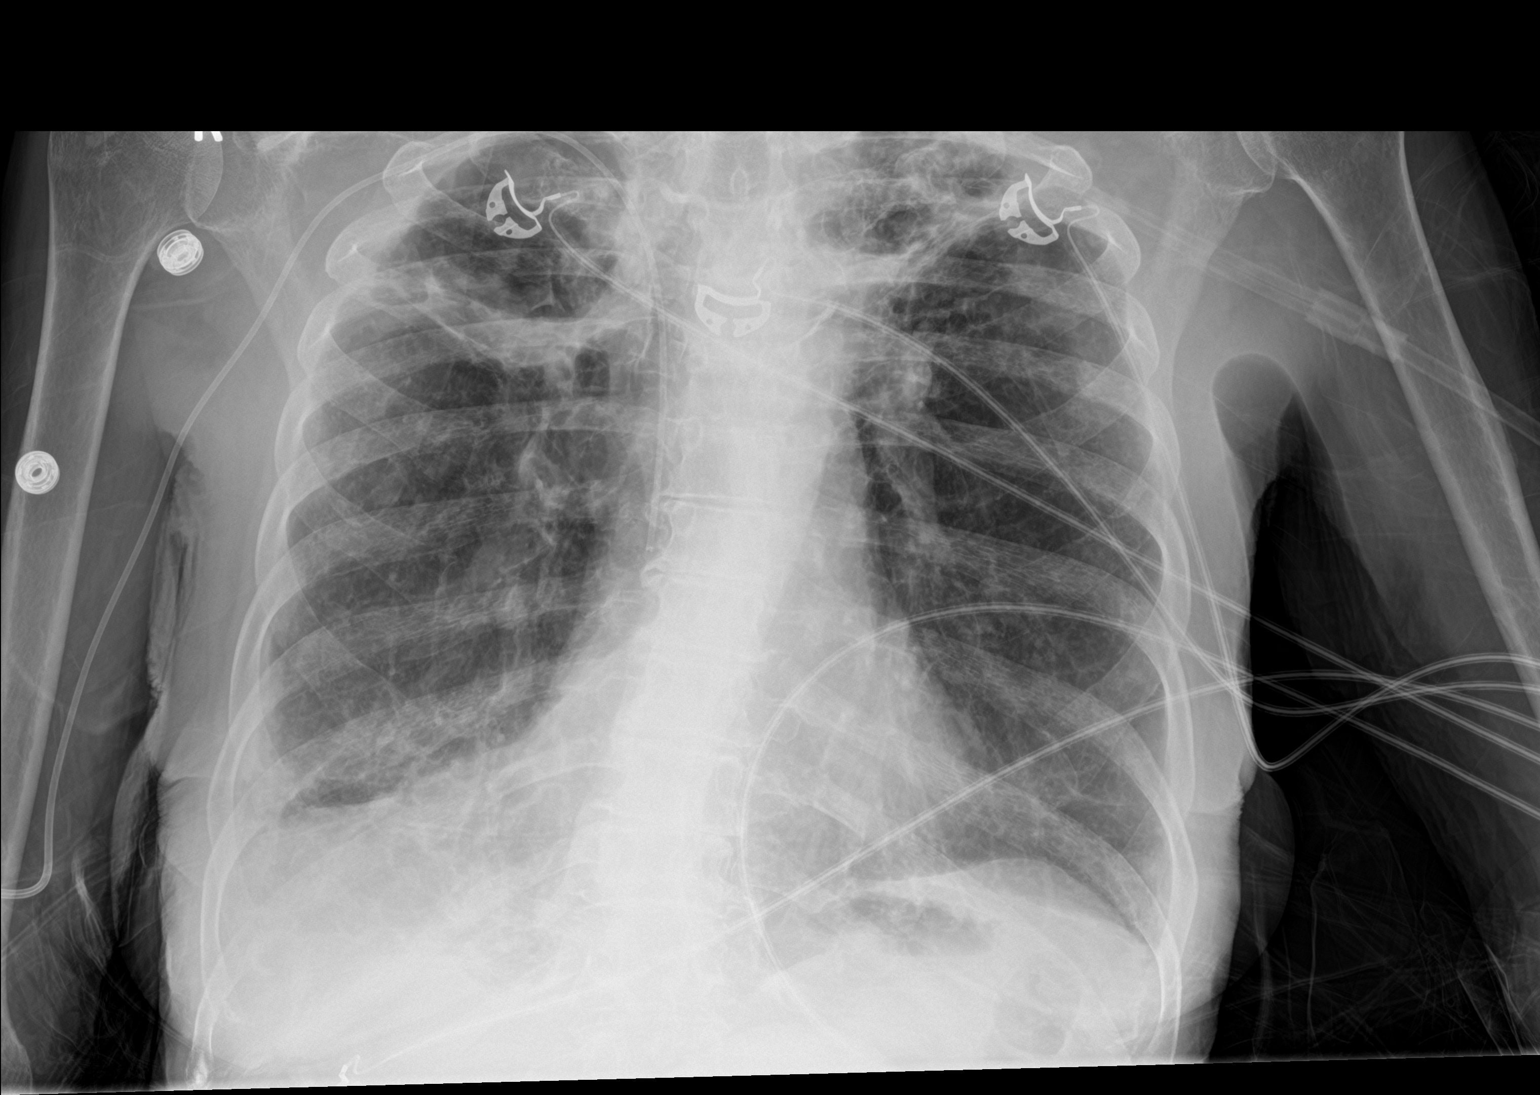

[2 of 2 positions shown; findings below may reference images not displayed]

FINDINGS: The endotracheal tube and nasogastric tube have been removed. There
is a right arm PICC which projects to the SVC right atrial level.
The heart size and mediastinal contours are stable. There is
extensive chronic lung disease with biapical volume loss and fibro
cavitary disease. This superimposed right apical density seen on the
recent studies has improved. No progressive airspace disease or
significant pleural effusion identified. No acute osseous findings
are seen. There is probable hydroxyapatite deposition in the region
of the rotator cuff bilaterally.
IMPRESSION: Interval improvement in right apical density, likely representing
partial treatment of superimposed acute inflammation. Underlying
extensive chronic lung disease grossly stable.
